# Patient Record
Sex: Female | Born: 1955 | Race: White | Hispanic: No | Smoking: Former smoker
Health system: Southern US, Community
[De-identification: ages and names within clinical notes are randomized; demographics above are authoritative.]

## PROBLEM LIST (undated history)

## (undated) DIAGNOSIS — R51 Headache: Secondary | ICD-10-CM

## (undated) DIAGNOSIS — N6019 Diffuse cystic mastopathy of unspecified breast: Secondary | ICD-10-CM

## (undated) DIAGNOSIS — L570 Actinic keratosis: Secondary | ICD-10-CM

## (undated) DIAGNOSIS — N898 Other specified noninflammatory disorders of vagina: Secondary | ICD-10-CM

## (undated) DIAGNOSIS — M8580 Other specified disorders of bone density and structure, unspecified site: Secondary | ICD-10-CM

## (undated) DIAGNOSIS — G4700 Insomnia, unspecified: Secondary | ICD-10-CM

## (undated) DIAGNOSIS — I059 Rheumatic mitral valve disease, unspecified: Secondary | ICD-10-CM

## (undated) DIAGNOSIS — F419 Anxiety disorder, unspecified: Secondary | ICD-10-CM

## (undated) DIAGNOSIS — L219 Seborrheic dermatitis, unspecified: Secondary | ICD-10-CM

## (undated) DIAGNOSIS — IMO0002 Reserved for concepts with insufficient information to code with codable children: Secondary | ICD-10-CM

## (undated) DIAGNOSIS — Z808 Family history of malignant neoplasm of other organs or systems: Secondary | ICD-10-CM

## (undated) DIAGNOSIS — E739 Lactose intolerance, unspecified: Secondary | ICD-10-CM

## (undated) HISTORY — DX: Insomnia, unspecified: G47.00

## (undated) HISTORY — DX: Other specified disorders of bone density and structure, unspecified site: M85.80

## (undated) HISTORY — DX: Other specified noninflammatory disorders of vagina: N89.8

## (undated) HISTORY — DX: Diffuse cystic mastopathy of unspecified breast: N60.19

## (undated) HISTORY — DX: Seborrheic dermatitis, unspecified: L21.9

## (undated) HISTORY — DX: Rheumatic mitral valve disease, unspecified: I05.9

## (undated) HISTORY — PX: TUBAL LIGATION: SHX77

## (undated) HISTORY — DX: Anxiety disorder, unspecified: F41.9

## (undated) HISTORY — DX: Actinic keratosis: L57.0

## (undated) HISTORY — DX: Headache: R51

## (undated) HISTORY — DX: Lactose intolerance, unspecified: E73.9

## (undated) HISTORY — PX: LUMBAR DISC SURGERY: SHX700

## (undated) HISTORY — DX: Family history of malignant neoplasm of other organs or systems: Z80.8

## (undated) HISTORY — DX: Reserved for concepts with insufficient information to code with codable children: IMO0002

---

## 2000-01-12 ENCOUNTER — Other Ambulatory Visit: Admission: RE | Admit: 2000-01-12 | Discharge: 2000-01-12 | Payer: Self-pay | Admitting: Family Medicine

## 2001-01-12 ENCOUNTER — Other Ambulatory Visit: Admission: RE | Admit: 2001-01-12 | Discharge: 2001-01-12 | Payer: Self-pay | Admitting: Family Medicine

## 2001-06-11 ENCOUNTER — Encounter: Payer: Self-pay | Admitting: Neurosurgery

## 2001-06-11 ENCOUNTER — Encounter: Admission: RE | Admit: 2001-06-11 | Discharge: 2001-06-11 | Payer: Self-pay | Admitting: Neurosurgery

## 2001-06-17 HISTORY — PX: OTHER SURGICAL HISTORY: SHX169

## 2001-06-21 ENCOUNTER — Inpatient Hospital Stay (HOSPITAL_COMMUNITY): Admission: RE | Admit: 2001-06-21 | Discharge: 2001-06-26 | Payer: Self-pay | Admitting: Neurosurgery

## 2002-02-17 HISTORY — PX: EXCISION MORTON'S NEUROMA: SHX5013

## 2002-04-17 ENCOUNTER — Other Ambulatory Visit: Admission: RE | Admit: 2002-04-17 | Discharge: 2002-04-17 | Payer: Self-pay | Admitting: Family Medicine

## 2003-01-18 HISTORY — PX: CERVICAL DISC SURGERY: SHX588

## 2003-02-06 ENCOUNTER — Inpatient Hospital Stay (HOSPITAL_COMMUNITY): Admission: RE | Admit: 2003-02-06 | Discharge: 2003-02-07 | Payer: Self-pay | Admitting: Neurosurgery

## 2003-07-22 ENCOUNTER — Other Ambulatory Visit: Admission: RE | Admit: 2003-07-22 | Discharge: 2003-07-22 | Payer: Self-pay | Admitting: Family Medicine

## 2004-07-23 ENCOUNTER — Other Ambulatory Visit: Admission: RE | Admit: 2004-07-23 | Discharge: 2004-07-23 | Payer: Self-pay | Admitting: Family Medicine

## 2004-07-23 ENCOUNTER — Ambulatory Visit: Payer: Self-pay | Admitting: Family Medicine

## 2004-07-28 ENCOUNTER — Ambulatory Visit: Payer: Self-pay | Admitting: Family Medicine

## 2004-08-10 ENCOUNTER — Ambulatory Visit: Payer: Self-pay | Admitting: Family Medicine

## 2004-10-17 HISTORY — PX: OTHER SURGICAL HISTORY: SHX169

## 2005-01-21 ENCOUNTER — Ambulatory Visit: Payer: Self-pay | Admitting: Internal Medicine

## 2005-03-28 ENCOUNTER — Ambulatory Visit: Payer: Self-pay | Admitting: Family Medicine

## 2005-04-12 ENCOUNTER — Ambulatory Visit: Payer: Self-pay | Admitting: Family Medicine

## 2005-05-04 ENCOUNTER — Ambulatory Visit: Payer: Self-pay | Admitting: Family Medicine

## 2005-05-25 ENCOUNTER — Ambulatory Visit: Payer: Self-pay | Admitting: Family Medicine

## 2005-06-01 ENCOUNTER — Ambulatory Visit: Payer: Self-pay | Admitting: Family Medicine

## 2005-09-08 ENCOUNTER — Other Ambulatory Visit: Admission: RE | Admit: 2005-09-08 | Discharge: 2005-09-08 | Payer: Self-pay | Admitting: Family Medicine

## 2005-09-09 ENCOUNTER — Encounter: Payer: Self-pay | Admitting: Family Medicine

## 2005-09-09 ENCOUNTER — Ambulatory Visit: Payer: Self-pay | Admitting: Family Medicine

## 2005-09-09 LAB — CONVERTED CEMR LAB: Pap Smear: NORMAL

## 2005-09-27 ENCOUNTER — Ambulatory Visit: Payer: Self-pay | Admitting: Family Medicine

## 2005-10-03 ENCOUNTER — Ambulatory Visit: Payer: Self-pay | Admitting: Family Medicine

## 2006-03-08 ENCOUNTER — Ambulatory Visit: Payer: Self-pay | Admitting: Family Medicine

## 2006-05-05 ENCOUNTER — Encounter: Payer: Self-pay | Admitting: Family Medicine

## 2006-05-05 DIAGNOSIS — J309 Allergic rhinitis, unspecified: Secondary | ICD-10-CM | POA: Insufficient documentation

## 2006-05-05 DIAGNOSIS — L570 Actinic keratosis: Secondary | ICD-10-CM | POA: Insufficient documentation

## 2006-05-05 DIAGNOSIS — F411 Generalized anxiety disorder: Secondary | ICD-10-CM | POA: Insufficient documentation

## 2006-05-05 DIAGNOSIS — I059 Rheumatic mitral valve disease, unspecified: Secondary | ICD-10-CM | POA: Insufficient documentation

## 2006-05-05 DIAGNOSIS — N6019 Diffuse cystic mastopathy of unspecified breast: Secondary | ICD-10-CM | POA: Insufficient documentation

## 2006-05-05 DIAGNOSIS — L219 Seborrheic dermatitis, unspecified: Secondary | ICD-10-CM | POA: Insufficient documentation

## 2006-05-05 DIAGNOSIS — M542 Cervicalgia: Secondary | ICD-10-CM | POA: Insufficient documentation

## 2006-09-15 ENCOUNTER — Ambulatory Visit: Payer: Self-pay | Admitting: Family Medicine

## 2006-09-16 ENCOUNTER — Encounter: Payer: Self-pay | Admitting: Family Medicine

## 2006-09-19 LAB — CONVERTED CEMR LAB
ALT: 26 units/L (ref 0–35)
AST: 34 units/L (ref 0–37)
Albumin: 4.6 g/dL (ref 3.5–5.2)
Alkaline Phosphatase: 57 units/L (ref 39–117)
BUN: 18 mg/dL (ref 6–23)
Basophils Absolute: 0 10*3/uL (ref 0.0–0.1)
Basophils Relative: 0 % (ref 0–1)
CO2: 24 meq/L (ref 19–32)
Calcium: 9.4 mg/dL (ref 8.4–10.5)
Chloride: 105 meq/L (ref 96–112)
Creatinine, Ser: 0.91 mg/dL (ref 0.40–1.20)
Eosinophils Absolute: 0 10*3/uL (ref 0.0–0.7)
Eosinophils Relative: 1 % (ref 0–5)
Glucose, Bld: 67 mg/dL — ABNORMAL LOW (ref 70–99)
HCT: 39.5 % (ref 36.0–46.0)
Hemoglobin: 12.8 g/dL (ref 12.0–15.0)
Lymphocytes Relative: 27 % (ref 12–46)
Lymphs Abs: 1.2 10*3/uL (ref 0.7–3.3)
MCHC: 32.4 g/dL (ref 30.0–36.0)
MCV: 92.7 fL (ref 78.0–100.0)
Monocytes Absolute: 0.3 10*3/uL (ref 0.2–0.7)
Monocytes Relative: 6 % (ref 3–11)
Neutro Abs: 2.9 10*3/uL (ref 1.7–7.7)
Neutrophils Relative %: 66 % (ref 43–77)
Platelets: 174 10*3/uL (ref 150–400)
Potassium: 4.2 meq/L (ref 3.5–5.3)
RBC: 4.26 M/uL (ref 3.87–5.11)
RDW: 13.7 % (ref 11.5–14.0)
Sodium: 141 meq/L (ref 135–145)
TSH: 2.489 microintl units/mL (ref 0.350–5.50)
Total Bilirubin: 0.6 mg/dL (ref 0.3–1.2)
Total Protein: 7.2 g/dL (ref 6.0–8.3)
WBC: 4.3 10*3/uL (ref 4.0–10.5)

## 2006-09-28 ENCOUNTER — Ambulatory Visit: Payer: Self-pay | Admitting: Family Medicine

## 2006-09-29 ENCOUNTER — Encounter (INDEPENDENT_AMBULATORY_CARE_PROVIDER_SITE_OTHER): Payer: Self-pay | Admitting: *Deleted

## 2006-09-29 LAB — FECAL OCCULT BLOOD, GUAIAC: Fecal Occult Blood: NEGATIVE

## 2006-11-01 ENCOUNTER — Encounter: Payer: Self-pay | Admitting: Family Medicine

## 2006-11-01 ENCOUNTER — Ambulatory Visit: Payer: Self-pay | Admitting: Family Medicine

## 2006-11-06 ENCOUNTER — Encounter (INDEPENDENT_AMBULATORY_CARE_PROVIDER_SITE_OTHER): Payer: Self-pay | Admitting: *Deleted

## 2007-09-18 ENCOUNTER — Other Ambulatory Visit: Admission: RE | Admit: 2007-09-18 | Discharge: 2007-09-18 | Payer: Self-pay | Admitting: Family Medicine

## 2007-09-18 ENCOUNTER — Ambulatory Visit: Payer: Self-pay | Admitting: Family Medicine

## 2007-09-18 ENCOUNTER — Encounter: Payer: Self-pay | Admitting: Family Medicine

## 2007-09-18 DIAGNOSIS — M899 Disorder of bone, unspecified: Secondary | ICD-10-CM

## 2007-09-18 DIAGNOSIS — M810 Age-related osteoporosis without current pathological fracture: Secondary | ICD-10-CM | POA: Insufficient documentation

## 2007-09-18 DIAGNOSIS — M8580 Other specified disorders of bone density and structure, unspecified site: Secondary | ICD-10-CM | POA: Insufficient documentation

## 2007-09-18 DIAGNOSIS — M949 Disorder of cartilage, unspecified: Secondary | ICD-10-CM

## 2007-09-21 ENCOUNTER — Telehealth (INDEPENDENT_AMBULATORY_CARE_PROVIDER_SITE_OTHER): Payer: Self-pay | Admitting: *Deleted

## 2007-09-22 LAB — CONVERTED CEMR LAB
ALT: 28 units/L (ref 0–35)
AST: 39 units/L — ABNORMAL HIGH (ref 0–37)
Albumin: 4.2 g/dL (ref 3.5–5.2)
Alkaline Phosphatase: 52 units/L (ref 39–117)
BUN: 14 mg/dL (ref 6–23)
Basophils Absolute: 0 10*3/uL (ref 0.0–0.1)
Basophils Relative: 0.3 % (ref 0.0–3.0)
Bilirubin, Direct: 0.1 mg/dL (ref 0.0–0.3)
CO2: 32 meq/L (ref 19–32)
Calcium: 9.3 mg/dL (ref 8.4–10.5)
Chloride: 111 meq/L (ref 96–112)
Cholesterol: 181 mg/dL (ref 0–200)
Creatinine, Ser: 0.9 mg/dL (ref 0.4–1.2)
Eosinophils Absolute: 0.1 10*3/uL (ref 0.0–0.7)
Eosinophils Relative: 1.9 % (ref 0.0–5.0)
GFR calc Af Amer: 84 mL/min
GFR calc non Af Amer: 70 mL/min
Glucose, Bld: 74 mg/dL (ref 70–99)
HCT: 37.2 % (ref 36.0–46.0)
HDL: 37.8 mg/dL — ABNORMAL LOW (ref >39.0)
Hemoglobin: 12.9 g/dL (ref 12.0–15.0)
LDL Cholesterol: 124 mg/dL — ABNORMAL HIGH (ref 0–99)
Lymphocytes Relative: 24.1 % (ref 12.0–46.0)
MCHC: 34.6 g/dL (ref 30.0–36.0)
MCV: 91.7 fL (ref 78.0–100.0)
Monocytes Absolute: 0.2 10*3/uL (ref 0.1–1.0)
Monocytes Relative: 5.5 % (ref 3.0–12.0)
Neutro Abs: 3 10*3/uL (ref 1.4–7.7)
Neutrophils Relative %: 68.2 % (ref 43.0–77.0)
Platelets: 172 10*3/uL (ref 150–400)
Potassium: 4.6 meq/L (ref 3.5–5.1)
RBC: 4.06 M/uL (ref 3.87–5.11)
RDW: 12.4 % (ref 11.5–14.6)
Sodium: 142 meq/L (ref 135–145)
TSH: 3.24 microintl units/mL (ref 0.35–5.50)
Total Bilirubin: 0.9 mg/dL (ref 0.3–1.2)
Total CHOL/HDL Ratio: 4.8
Total Protein: 7 g/dL (ref 6.0–8.3)
Triglycerides: 94 mg/dL (ref 0–149)
VLDL: 19 mg/dL (ref 0–40)
WBC: 4.3 10*3/uL — ABNORMAL LOW (ref 4.5–10.5)

## 2007-10-04 ENCOUNTER — Ambulatory Visit: Payer: Self-pay | Admitting: Family Medicine

## 2007-10-04 LAB — CONVERTED CEMR LAB
OCCULT 1: NEGATIVE
OCCULT 2: NEGATIVE
OCCULT 3: NEGATIVE

## 2007-10-18 ENCOUNTER — Encounter: Payer: Self-pay | Admitting: Family Medicine

## 2007-11-02 ENCOUNTER — Encounter (INDEPENDENT_AMBULATORY_CARE_PROVIDER_SITE_OTHER): Payer: Self-pay | Admitting: *Deleted

## 2007-11-06 ENCOUNTER — Ambulatory Visit: Payer: Self-pay | Admitting: Family Medicine

## 2007-11-06 ENCOUNTER — Encounter: Payer: Self-pay | Admitting: Family Medicine

## 2007-12-11 ENCOUNTER — Encounter: Payer: Self-pay | Admitting: Family Medicine

## 2008-01-03 ENCOUNTER — Ambulatory Visit: Payer: Self-pay | Admitting: Family Medicine

## 2008-03-31 ENCOUNTER — Ambulatory Visit: Payer: Self-pay | Admitting: Family Medicine

## 2008-03-31 ENCOUNTER — Telehealth: Payer: Self-pay | Admitting: Family Medicine

## 2008-03-31 DIAGNOSIS — R519 Headache, unspecified: Secondary | ICD-10-CM | POA: Insufficient documentation

## 2008-03-31 DIAGNOSIS — R51 Headache: Secondary | ICD-10-CM | POA: Insufficient documentation

## 2008-03-31 DIAGNOSIS — L259 Unspecified contact dermatitis, unspecified cause: Secondary | ICD-10-CM | POA: Insufficient documentation

## 2008-04-04 LAB — CONVERTED CEMR LAB
Basophils Absolute: 0 10*3/uL (ref 0.0–0.1)
Basophils Relative: 0.5 % (ref 0.0–3.0)
Eosinophils Absolute: 0 10*3/uL (ref 0.0–0.7)
Eosinophils Relative: 0.7 % (ref 0.0–5.0)
HCT: 37.6 % (ref 36.0–46.0)
Hemoglobin: 13.2 g/dL (ref 12.0–15.0)
Lymphocytes Relative: 23.3 % (ref 12.0–46.0)
MCHC: 35.1 g/dL (ref 30.0–36.0)
MCV: 90.1 fL (ref 78.0–100.0)
Monocytes Absolute: 0.3 10*3/uL (ref 0.1–1.0)
Monocytes Relative: 6.4 % (ref 3.0–12.0)
Neutro Abs: 3.4 10*3/uL (ref 1.4–7.7)
Neutrophils Relative %: 69.1 % (ref 43.0–77.0)
Platelets: 148 10*3/uL — ABNORMAL LOW (ref 150–400)
RBC: 4.17 M/uL (ref 3.87–5.11)
RDW: 12.3 % (ref 11.5–14.6)
Sed Rate: 11 mm/hr (ref 0–22)
WBC: 4.8 10*3/uL (ref 4.5–10.5)

## 2008-08-19 ENCOUNTER — Ambulatory Visit: Payer: Self-pay | Admitting: Family Medicine

## 2008-09-23 ENCOUNTER — Encounter: Payer: Self-pay | Admitting: Family Medicine

## 2008-10-08 ENCOUNTER — Ambulatory Visit: Payer: Self-pay | Admitting: Family Medicine

## 2008-11-10 ENCOUNTER — Encounter: Payer: Self-pay | Admitting: Family Medicine

## 2008-11-10 ENCOUNTER — Ambulatory Visit: Payer: Self-pay | Admitting: Family Medicine

## 2008-11-20 ENCOUNTER — Encounter (INDEPENDENT_AMBULATORY_CARE_PROVIDER_SITE_OTHER): Payer: Self-pay | Admitting: *Deleted

## 2008-11-21 ENCOUNTER — Ambulatory Visit: Payer: Self-pay | Admitting: Family Medicine

## 2008-11-24 LAB — CONVERTED CEMR LAB
Alkaline Phosphatase: 56 units/L (ref 39–117)
BUN: 12 mg/dL (ref 6–23)
Basophils Absolute: 0 10*3/uL (ref 0.0–0.1)
Bilirubin, Direct: 0.2 mg/dL (ref 0.0–0.3)
Chloride: 107 meq/L (ref 96–112)
Cholesterol: 162 mg/dL (ref 0–200)
Eosinophils Absolute: 0 10*3/uL (ref 0.0–0.7)
GFR calc non Af Amer: 69.17 mL/min (ref >60)
LDL Cholesterol: 100 mg/dL — ABNORMAL HIGH (ref 0–99)
Lymphocytes Relative: 33.1 % (ref 12.0–46.0)
MCHC: 35.6 g/dL (ref 30.0–36.0)
Neutrophils Relative %: 57.2 % (ref 43.0–77.0)
Platelets: 167 10*3/uL (ref 150.0–400.0)
Potassium: 4.9 meq/L (ref 3.5–5.1)
RBC: 4.13 M/uL (ref 3.87–5.11)
RDW: 12 % (ref 11.5–14.6)
Sodium: 144 meq/L (ref 135–145)
Total Bilirubin: 1 mg/dL (ref 0.3–1.2)
VLDL: 11.6 mg/dL (ref 0.0–40.0)
WBC: 3.5 10*3/uL — ABNORMAL LOW (ref 4.5–10.5)

## 2009-01-28 ENCOUNTER — Ambulatory Visit: Payer: Self-pay | Admitting: Family Medicine

## 2009-05-29 ENCOUNTER — Ambulatory Visit: Payer: Self-pay | Admitting: Family Medicine

## 2009-06-08 ENCOUNTER — Telehealth: Payer: Self-pay | Admitting: Family Medicine

## 2009-06-10 ENCOUNTER — Telehealth: Payer: Self-pay | Admitting: Family Medicine

## 2009-06-10 ENCOUNTER — Encounter: Payer: Self-pay | Admitting: Family Medicine

## 2009-06-19 ENCOUNTER — Ambulatory Visit: Payer: Self-pay | Admitting: Family Medicine

## 2009-08-19 ENCOUNTER — Ambulatory Visit: Payer: Self-pay | Admitting: Family Medicine

## 2010-01-13 ENCOUNTER — Telehealth (INDEPENDENT_AMBULATORY_CARE_PROVIDER_SITE_OTHER): Payer: Self-pay | Admitting: *Deleted

## 2010-01-13 ENCOUNTER — Encounter: Payer: Self-pay | Admitting: Family Medicine

## 2010-01-13 ENCOUNTER — Ambulatory Visit: Payer: Self-pay | Admitting: Family Medicine

## 2010-01-18 LAB — CONVERTED CEMR LAB
ALT: 24 units/L (ref 0–35)
BUN: 14 mg/dL (ref 6–23)
Basophils Absolute: 0 10*3/uL (ref 0.0–0.1)
Bilirubin, Direct: 0.1 mg/dL (ref 0.0–0.3)
Cholesterol: 178 mg/dL (ref 0–200)
Creatinine, Ser: 0.9 mg/dL (ref 0.4–1.2)
Eosinophils Relative: 1.2 % (ref 0.0–5.0)
GFR calc non Af Amer: 71.63 mL/min (ref >60.00)
Glucose, Bld: 83 mg/dL (ref 70–99)
LDL Cholesterol: 113 mg/dL — ABNORMAL HIGH (ref 0–99)
MCV: 90.3 fL (ref 78.0–100.0)
Monocytes Absolute: 0.3 10*3/uL (ref 0.1–1.0)
Neutrophils Relative %: 62.7 % (ref 43.0–77.0)
Platelets: 173 10*3/uL (ref 150.0–400.0)
RDW: 13.1 % (ref 11.5–14.6)
Total Bilirubin: 0.6 mg/dL (ref 0.3–1.2)
Triglycerides: 50 mg/dL (ref 0.0–149.0)
VLDL: 10 mg/dL (ref 0.0–40.0)
Vit D, 25-Hydroxy: 42 ng/mL (ref 30–89)
WBC: 4.3 10*3/uL — ABNORMAL LOW (ref 4.5–10.5)

## 2010-01-27 ENCOUNTER — Ambulatory Visit
Admission: RE | Admit: 2010-01-27 | Discharge: 2010-01-27 | Payer: Self-pay | Source: Home / Self Care | Attending: Family Medicine | Admitting: Family Medicine

## 2010-01-27 DIAGNOSIS — G4700 Insomnia, unspecified: Secondary | ICD-10-CM | POA: Insufficient documentation

## 2010-02-17 ENCOUNTER — Encounter: Payer: Self-pay | Admitting: Family Medicine

## 2010-02-17 ENCOUNTER — Ambulatory Visit: Payer: Self-pay | Admitting: Family Medicine

## 2010-02-17 LAB — HM MAMMOGRAPHY: HM Mammogram: NORMAL

## 2010-02-18 NOTE — Miscellaneous (Signed)
Summary: Controlled Substance Agreement  Controlled Substance Agreement   Imported By: Lanelle Bal 06/25/2009 09:07:34  _____________________________________________________________________  External Attachment:    Type:   Image     Comment:   External Document

## 2010-02-18 NOTE — Progress Notes (Signed)
----   Converted from flag ---- ---- 01/13/2010 8:06 AM, Colon Flattery Tower MD wrote: please check wellness and lipid and vitD v70.0 and 733.0 thanks  ---- 01/12/2010 11:13 AM, Liane Comber CMA (AAMA) wrote: Lab orders please! Good Morning! This pt is scheduled for cpx labs Wed, which labs to draw and dx codes to use? Thanks Tasha ------------------------------

## 2010-02-18 NOTE — Assessment & Plan Note (Signed)
Summary: iNSURANCE FORM PER DR TOWR/RI   Vital Signs:  Patient profile:   55 year old female Height:      68.5 inches Weight:      149.75 pounds BMI:     22.52 Temp:     98.3 degrees F oral Pulse rate:   80 / minute Pulse rhythm:   regular BP sitting:   136 / 84  (left arm) Cuff size:   regular  Vitals Entered By: Lewanda Rife LPN (June 19, 452 11:47 AM) CC: Insurance form   History of Present Illness: is doing quite a bit better - husband is doing better and getting through chemo  she is helping him out  will plan some surgery - positive outlook   she is feeling better  feels like she can focus now and head is clear  is set up at home  wants to go back to work monday    took paxil - due to constipation after 5 days  ? if could tell -- if was helping  now managing a lot better   has 2 insurance disability forms to fill out that are detailed (scanned - see those questions)- about psychiatric functionality and ability to work    Allergies: 1)  ! * Flonase 2)  ! * Calcium 3)  ! 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  walks daily for exercise  no alcohol  married- husb dx with stomach cancer 5/11  Risk Factors: Smoking Status:  quit (05/05/2006)  Review of Systems General:  Complains of fatigue; denies loss of appetite and malaise. Eyes:  Denies blurring, double vision, and eye irritation. CV:  Denies chest pain or discomfort, lightheadness, and palpitations. Resp:  Denies cough and wheezing. GI:  Denies abdominal pain, nausea, and vomiting; appetite is back. MS:  Denies joint pain. Derm:  Denies rash. Psych:  Complains of anxiety; denies panic attacks; panic attacks are gone. Endo:  Denies cold intolerance and heat intolerance.  Physical Exam  General:  Well-developed,well-nourished,in no acute distress; alert,appropriate and cooperative throughout examination Head:  normocephalic, atraumatic, and no abnormalities observed.   Eyes:  vision grossly intact, pupils equal, pupils round, and pupils reactive to light.   Neck:  supple with full rom and no masses or thyromegally, no JVD or carotid bruit  Lungs:  Normal respiratory effort, chest expands symmetrically. Lungs are clear to auscultation, no crackles or wheezes. Heart:  Normal rate and regular rhythm. S1 and S2 normal without gallop, murmur, click, rub or other extra sounds. Msk:  No deformity or scoliosis noted of thoracic or lumbar spine.   Extremities:  No clubbing, cyanosis, edema, or deformity noted with normal full range of motion of all joints.   Neurologic:  cranial nerves II-XII intact, sensation intact to light touch, gait normal, and DTRs symmetrical and normal.  no tremor  Skin:  Intact without  suspicious lesions or rashes Cervical Nodes:  No lymphadenopathy noted Psych:  much improved affect  not tearful  good eye contact and communication skills   Additional Exam:  answered all form questions in detail - this took over 20 min face to face time    Impression & Recommendations:  Problem # 1:  ANXIETY (ICD-300.00) Assessment Improved  with stress reaction--much improved after just 5 days of medication extensive forms filled out today --  see forms / scanned feels able to return to work monday good coping skills and support  alprazolam as needed  The following medications were removed from the medication list:    Paxil 10 Mg Tabs (Paroxetine hcl) .Marland Kitchen... 1 by mouth once daily in evening for 14 days and then increase to 2 by mouth each evening Her updated medication list for this problem includes:    Alprazolam 0.5 Mg Tabs (Alprazolam) .Marland Kitchen... 1 by mouth once daily as needed anxiety  Orders: Form Completion (16[address])  Complete Medication List: 1)  Vitamin D  .... Daily 2)  Ibp  .... As needed 3)  Flexeril 10 Mg Tabs (Cyclobenzaprine hcl) .... 1/2  by mouth at bedtime as needed 4)  Alprazolam 0.5 Mg Tabs (Alprazolam) .Marland Kitchen.. 1 by mouth once daily as needed anxiety 5)  Nasonex 50 Mcg/act Susp (Mometasone furoate) .... Use 2 sprays each nostril once daily as needed  Patient Instructions: 1)  back to work [employer] as planned  2)  let me know if you feel worse again - or any questions 3)  I will get papers faxed in today   Current Allergies (reviewed today): ! Aleda Grana ! * CALCIUM ! PAXIL

## 2010-02-18 NOTE — Assessment & Plan Note (Signed)
Summary: anxiety/alc   Vital Signs:  Patient profile:   55 year old female Height:      68.5 inches Weight:      150.75 pounds BMI:     22.67 Temp:     98.5 degrees F oral Pulse rate:   76 / minute Pulse rhythm:   regular BP sitting:   128 / 80  (left arm) Cuff size:   regular  Vitals Entered By: Lewanda Rife LPN (May 29, 2009 4:11 PM) CC: anxiety   History of Present Illness: is having a lot of stress and anxiety  just found out husband has stomach cancer -- just had some scans today   is jumpy and uneasy inside  not a lot of panic attacks - she can stop them  is sleeping fairly -- oc uses alprazolam at night  appetite is so/so -- sometimes has to eat when she is not hungry  cries a lot  is more anxious than down feeling for the most part    is not able to work right now  cannot concentrate and cries a lot    has had anx for long time  started in her late 30s when she cared for her sick mother  started having panic attacks -- (after a cardiol work up )  learned to help herself with deep breaths and rational throught -- for a while that worked  also jumpy inside-- took valium for for about a month   has never had SSRI  has never had counseling before  sisters are support-- good listeners and help out a lot  does exercise- walking - and this helps too  working in the garden helps too has dog and cat too      Allergies: 1)  ! * Flonase 2)  ! 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  walks daily for exercise  no alcohol  married- husb dx with stomach cancer 5/11  Risk Factors: Smoking Status: quit (05/05/2006)  Social History: quit smoking 2003 Patient is a former smoker.  walks daily for exercise  no alcohol  married- husb dx with stomach cancer 5/11  Review of Systems General:  Complains of fatigue, loss of appetite, and sleep disorder; denies malaise. Eyes:  Denies blurring and eye irritation. CV:  Denies chest pain or discomfort and palpitations. Resp:  Denies cough and shortness of breath. GI:  Denies excessive appetite, nausea, and vomiting. MS:  Denies joint pain. Derm:  Denies lesion(s), poor wound healing, and rash. Neuro:  Complains of tremors; denies headaches and memory loss. Psych:  Complains of anxiety, depression, easily tearful, and panic attacks; denies suicidal thoughts/plans. Endo:  Denies cold intolerance, excessive thirst, excessive urination, and heat intolerance. Heme:  Denies abnormal bruising and bleeding.  Physical Exam  General:  fatigued but well appearing  Head:  normocephalic, atraumatic, and no abnormalities observed.  Eyes:  vision grossly intact, pupils equal, pupils round, and pupils reactive to light.   Mouth:  pharynx pink and moist.   Neck:  supple with full rom and no masses or thyromegally, no JVD or carotid bruit  Chest Wall:  No deformities, masses, or tenderness noted. Lungs:  Normal respiratory effort, chest expands symmetrically. Lungs are clear to auscultation, no crackles or wheezes. Heart:  Normal rate and regular rhythm. S1 and S2 normal without gallop, murmur, click, rub or other extra sounds. Abdomen:  soft, non-tender, and normal bowel  sounds.   Msk:  No deformity or scoliosis noted of thoracic or lumbar spine.   Neurologic:  sensation intact to light touch, gait normal, and DTRs symmetrical and normal.  no tremor today Skin:  Intact without suspicious lesions or rashes Cervical Nodes:  No lymphadenopathy noted Psych:  anxoius and depressed appearing tearful at times  good insight and comm skills  eye contact fair no SI   Impression & Recommendations:  Problem # 1:  ANXIETY (ICD-300.00) Assessment Deteriorated much worse with news of husband's cancer  disc hx of anx-never in past has it incapacitated her and now it is (not unexpectedly)  disc symptoms / coping skills/ insight/ support sources/ tx options and side eff in great detail today she has good insight and does not want counseling yet since she has such excellent family support plan made to start her on paxil (side eff disc) and use alprazolam for breakthrough anx (wilth effort to hot create habit) enc [place] of gardening / exercise  f/u 3 -4 wk  FMLA done - will likely be out of work for 1-2 mo  30 min face to face time spent with pt - over 50% of that spent in  counseling and coordination of care  Her updated medication list for this problem includes:    Alprazolam 0.5 Mg Tabs (Alprazolam) .Marland Kitchen... 1 by mouth once daily as needed anxiety    Paxil 10 Mg Tabs (Paroxetine hcl) .Marland Kitchen... 1 by mouth once daily in evening for 14 days and then increase to 2 by mouth each evening  Complete Medication List: 1)  Vitamin D  .... Daily 2)  Ibp  .... As needed 3)  Flexeril 10 Mg Tabs (Cyclobenzaprine hcl) .... 1/2  by mouth at bedtime as needed 4)  Alprazolam 0.5 Mg Tabs (Alprazolam) .Marland Kitchen.. 1 by mouth once daily as needed anxiety 5)  Nasonex 50 Mcg/act Susp (Mometasone furoate) .... Use 2 sprays each nostril once daily as needed 6)  Paxil 10 Mg Tabs (Paroxetine hcl) .Marland Kitchen.. 1 by mouth once daily in evening for 14 days and then increase to 2 by mouth each  evening  Patient Instructions: 1)  start paxil 10 mg one pill each evening for 2 weeks and then increase to 2 pills each evening  2)  a little nausea is common in first few days- but if it persists- let me know 3)  if other side effects or if you feel worse or suicidal stop med and call  4)  keep up the exercise  5)  keep up the contact with your support  6)  use alprazolam only as needed  7)  if you are interested in counseling in the future- please update me  8)  follow up in 3 weeks with me  9)  plan to return to work in 4 weeks if feeling better  Prescriptions: ALPRAZOLAM 0.5 MG TABS (ALPRAZOLAM) 1 by mouth once daily as needed anxiety  #  30 x 1   Entered and Authorized by:   Judith Part MD   Signed by:   Judith Part MD on 05/29/2009   Method used:   Print then Give to Patient   RxID:   [id number] PAXIL 10 MG TABS (PAROXETINE HCL) 1 by mouth once daily in evening for 14 days and then increase to 2 by mouth each evening  #60 x 3   Entered and Authorized by:   Judith Part MD   Signed by:   Judith Part MD on 05/29/2009   Method used:   Electronically to        [facility] [address] (retail)       [address], [address]       [place], [place]  [address]       Ph: [phone]       Fax: [phone]   RxID:   [id number]   Current Allergies (reviewed today): ! Aleda Grana ! * CALCIUM

## 2010-02-18 NOTE — Progress Notes (Signed)
Summary: ? Disability Forms Received  Phone Note From Other Clinic   Caller: [employer] Disability Forms:  [id number]      Claim Ref:  [id number] Summary of Call: [employer] calling to see if the Disability Forms have been received.  They were faxed on May 17th.  Please let me know if you do not have them, I will call them back. Initial call taken by: Delilah Shan CMA Duncan Dull),  Jun 08, 2009 11:28 AM  Follow-up for Phone Call        I required pt to f/u with me to fill out these forms since they are so detailed I will hold them on my desk until she f/u on june 3 at 11:30 so we can do them together  Follow-up by: Judith Part MD,  Jun 08, 2009 12:46 PM

## 2010-02-18 NOTE — Assessment & Plan Note (Signed)
Summary: NEED FORM FILL OUT  CYD   Vital Signs:  Patient profile:   55 year old female Weight:      152 pounds Temp:     98.2 degrees F oral Pulse rate:   84 / minute Pulse rhythm:   regular BP sitting:   132 / 80  (left arm) Cuff size:   regular  Vitals Entered By: Lowella Petties CMA (January 28, 2009 2:01 PM) CC: Pt needs FMLA form completed- she was out of work sick last week.   History of Present Illness: works at [employer]-- was out for a week -- for illness needs fmla for that time  was sick with the flu or virus  (vomited for 14 hours)  started with vomiting and diarrhea  really zapped her - very weak and dehydrated  missed from jan 1- jan 7th   is drinking good fluids and pepcid is helping stomach - which is still sore and growly  is getting back to solid food - apple sauce and toast and chicken and jello  lot of gatorade  is feeling better now  did have a fever -- chilles and body aches  husband got it too   does not think it was food related   also lost her brother to lung ca this week (was out for services) doing ok with grief overall- a tough time for her family     Allergies: 1)  ! * Flonase 2)  ! 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  walks daily for exercise   Risk Factors: Smoking Status: quit (05/05/2006)  Family History: sister with melanoma father with lung ca mother CAD, CHF, HTN son with anx brother with lung ca-smoker-- passed 1/11  Review of Systems General:  Complains of fatigue and loss of appetite; denies chills, fever, and malaise. Eyes:  Denies blurring and discharge. ENT:  Denies earache, nasal congestion, and sore throat. CV:  Denies chest pain or discomfort and palpitations. Resp:  Denies cough and shortness of breath. GI:  Complains of change in bowel habits, indigestion, loss of appetite, and nausea; denies dark tarry stools. Derm:  Denies poor wound healing and rash. Neuro:  Denies headaches. Endo:  Denies polyuria.  Physical Exam  General:  fatigued but well appearing  Head:  normocephalic, atraumatic, and no abnormalities observed.   Eyes:  vision grossly intact, pupils equal, pupils round, and pupils reactive to light.  no conjunctival pallor, injection or icterus  Mouth:  pharynx pink and moist.   Neck:  supple with full rom and no masses or thyromegally, no JVD or carotid bruit  Lungs:  Normal respiratory effort, chest expands symmetrically. Lungs are clear to auscultation, no crackles or wheezes. Heart:  Normal rate and regular rhythm. S1 and S2 normal without gallop, murmur, click, rub or other extra sounds. Abdomen:  slt tenderness bilat LQ without  rebound or gaurding soft, normal bowel sounds, no distention, no masses, no hepatomegaly, and no splenomegaly.   Skin:  Intact without suspicious lesions or rashes nl color and turgor with brisk cap refil Cervical Nodes:  No lymphadenopathy noted Inguinal Nodes:  No significant adenopathy Psych:  normal affect, talkative and pleasant    Impression & Recommendations:  Problem # 1:  GASTROENTERITIS, VIRAL (ICD-008.8) Assessment New with absence from work for 1 week - now much improved  disc imp  of very slow return to reg diet as tol (brat diet until then) continue good fluids- no signs of dehydration today ) update asap if symptoms worsen or return  continue pepcid for dyspepsia as needed  FMLA done for work today  Complete Medication List: 1)  Vitamin D  .... Daily 2)  Ibp  .... As needed 3)  Flexeril 10 Mg Tabs (Cyclobenzaprine hcl) .... 1/2  by mouth at bedtime 4)  Alprazolam 0.5 Mg Tabs (Alprazolam) .Marland Kitchen.. 1 by mouth once daily as needed anxiety 5)  Nasonex 50 Mcg/act Susp (Mometasone furoate) .... Use 2 sprays each nostril once daily  Patient Instructions: 1)  continue good fluid intake 2)  advance diet slowly as tolerated 3)  update me if symptoms worsen again  4)  forms done   Prior Medications (reviewed today): VITAMIN D () Daily IBP () as needed FLEXERIL 10 MG TABS (CYCLOBENZAPRINE HCL) 1/2  by mouth at bedtime ALPRAZOLAM 0.5 MG TABS (ALPRAZOLAM) 1 by mouth once daily as needed anxiety NASONEX 50 MCG/ACT SUSP (MOMETASONE FUROATE) Use 2 sprays each nostril once daily Current Allergies: ! Aleda Grana ! * CALCIUM

## 2010-02-18 NOTE — Letter (Signed)
Summary: Out of Work  [facility] [employer] [facility] at [facility]  [address] [place], [place] [address]   Phone: [phone]  Fax: [phone]    June 19, 2009   Employee:  HETVI SHAWHAN    To Whom It May Concern:   For Medical reasons, please excuse the above named employee from work for the following dates:  Start:   05/20/09  End:   06/22/2009 if she is feeling better   If you need additional information, please feel free to contact our office.         Sincerely,    Judith Part MD

## 2010-02-18 NOTE — Assessment & Plan Note (Signed)
Summary: RASH ON BACK/JBB   Vital Signs:  Patient Profile:   55 Years Old Female CC:      Rash on the back. / rwt Height:     66.5 inches (170.18 cm) Weight:      151 pounds BMI:     24.09 O2 Sat:      100 % O2 treatment:    Room Air Temp:     97.6 degrees F oral Pulse rate:   93 / minute Pulse rhythm:   regular Resp:     18 per minute BP sitting:   154 / 91  (right arm)  Pt. in pain?   no  Vitals Entered By: Levonne Spiller EMT-P (August 19, 2009 11:28 AM)              Is Patient Diabetic? No      Current Allergies: ! Aleda Grana ! * CALCIUM ! PAXILHistory of Present Illness History from: patient Reason for visit: see chief complaint Chief Complaint: Rash on the back. / rwt History of Present Illness: She noticed pruitic rash on the right upper back yesterday. She does work out in the garden, but doesn't remember a specific bite and hasn't pulled a tick off. She has been under stress, so wanted to make sure it wasn't shingles. Denies pain or deep itch. She has been taking Benadryl with little relief.   REVIEW OF SYSTEMS        Psych       Complains of anxiety/stress. Otherwise answered ROS as negative.  Past History:  Family History: Last updated: 01/28/2009 sister with melanoma father with lung ca mother CAD, CHF, HTN son with anx brother with lung ca-smoker-- passed 1/11  Social History: Last updated: 05/29/2009 quit smoking 2003 Patient is a former smoker.  walks daily for exercise  no alcohol  married- husb dx with stomach cancer 5/11 Physical Exam General appearance: well developed, well nourished, no acute distress Skin: pruitic erythematous mp circular patch right  thoracic area - two seborrheic keratotic lesions present within erythematous area MSE: oriented to time, place, and person Assessment New Problems: CONTACT DERMATITIS-NOS (ICD-692.9)   Plan New Medications/Changes: FLUOCINONIDE 0.05 % CREA (FLUOCINONIDE) apply to affected area  Q4hours x 1 day then three times a day as needed for itching  #15g x 0, 08/19/2009, Tacey Ruiz MD  New Orders: Est. Patient Level II [id number] Planning Comments:   Told her to continue taking Benadryl as needed by mouth. If the rash is not resolving or spreads then return or present to her PCP.   The patient and/or caregiver has been counseled thoroughly with regard to medications prescribed including dosage, schedule, interactions, rationale for use, and possible side effects and they verbalize understanding.  Diagnoses and expected course of recovery discussed and will return if not improved as expected or if the condition worsens. Patient and/or caregiver verbalized understanding.  Prescriptions: FLUOCINONIDE 0.05 % CREA (FLUOCINONIDE) apply to affected area Q4hours x 1 day then three times a day as needed for itching  #15g x 0   Entered and Authorized by:   Tacey Ruiz MD   Signed by:   Tacey Ruiz MD on 08/19/2009   Method used:   Electronically to        [facility] (retail)       [address], [address]       [place], [place]  [address]  Ph: [phone]       Fax: [phone]   RxID:   [id number]   Orders Added: 1)  Est. Patient Level II [84132]  The patient was informed that there is no on-call provider or services available at this clinic during off-hours (when the clinic is closed).  If the patient developed a problem or concern that required immediate attention, the patient was advised to go the the nearest available urgent care or emergency department for medical care.  The patient verbalized understanding.    The risks, benefits and possible side effects of the treatments and tests were explained clearly to the patient and the patient verbalized understanding.

## 2010-02-18 NOTE — Progress Notes (Signed)
Summary: needs letter for disability  Phone Note Other Incoming   Caller: [employer] Summary of Call: Pt dropped off disability forms for you to complete and was told she would need an office visit so that the 2 of you could go over them together.  She has an appt for 6/3 but Monia Pouch is saying that is too far away and her claim will be denied.  They are asking that you write a letter stating the date you took her out of work and the diagnosis, and that you will be sending the forms later.  This needs to be done asap and faxed to [employer] at [phone], so that the pt wont be denied. Initial call taken by: Lowella Petties CMA,  Jun 10, 2009 11:16 AM  Follow-up for Phone Call        note done and ready for fax  Follow-up by: Judith Part MD,  Jun 10, 2009 1:13 PM  Additional Follow-up for Phone Call Additional follow up Details #1::        letter faxed to [phone] as instructed. Letter at Rena's desk if needed.Lewanda Rife LPN  Jun 10, 2009 4:31 PM

## 2010-02-18 NOTE — Letter (Signed)
Summary: Generic Letter  [place] at [facility]  [address] [place], [place] [address]   Phone: [phone]  Fax: [phone]    06/10/2009  Denise Grant [address] [place], [place]  [address]  To whom it may concern,   My patient Denise Grant is currently out of work for severe anxiety associated with recent diagnosis of husband with cancer. She has been out of work since 05/20/09 for this condition.  She has appt scheduled with me on 06/19/2009 to review and fill out more detailed disability paperwork.     Please contact me if any questions.  Papers will be sent as soon as they are done.    Sincerely,   Roxy Manns MD

## 2010-02-18 NOTE — Assessment & Plan Note (Signed)
Summary: CPX/CLE   Vital Signs:  Patient profile:   55 year old female Height:      65.5 inches Weight:      149 pounds BMI:     24.51 Temp:     98.2 degrees F oral Pulse rate:   80 / minute Pulse rhythm:   regular BP sitting:   136 / 74  (left arm) Cuff size:   regular  Vitals Entered By: Lewanda Rife LPN (January 27, 2010 2:24 PM) CC: CPX LMP 2004   History of Present Illness: here for health mt exam and to disc chronic health problems  is doing ok overall  taking care of her husband with stomach cancer -- J tube and some eating  surgery went fairly well / chemo and rad   is having nasal congestion and some sinus pain on R and green d/c with blood no fever  thick post nasal drip  started last week  no cough   needs something to sleep at night  alprazolam was helpful when she had it  the otc pm meds do not work    wt is down 2 lb with bmi of 24  bp is 136/74 today  osteopenia 10/09- declined tx she declines dexa at this time  intol ca takes vit D and level is 42-- takes D sometimes  still a non smoker   pap nl 9/09 no symptoms or problems no abn paps  no new partners  has stable vaginal cyst-- no change   mam 10/10-- is due , wants [place] to set that up  self exam   td 04 does not get flu shots   lipids are fairly stable Last Lipid ProfileCholesterol: 178 (01/13/2010 8:56:45 AM)HDL:  55.20 (01/13/2010 8:56:45 AM)LDL:  113 (01/13/2010 8:56:45 AM)Triglycerides:  Last Liver profileSGOT:  32 (01/13/2010 8:56:45 AM)SPGT:  24 (01/13/2010 8:56:45 AM)T. Bili:  0.6 (01/13/2010 8:56:45 AM)Alk Phos:  53 (01/13/2010 8:56:45 AM)   is not exercising at all  wishes she could fit in  ? stop by track on way home        Allergies: 1)  ! * Flonase 2)  ! * Calcium 3)  ! 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  walks daily for exercise  no alcohol  married- husb dx with stomach cancer 5/11  Risk Factors: Smoking Status: quit (05/05/2006)  Review of Systems General:  Complains of sleep disorder; denies chills, fever, loss of appetite, and malaise. Eyes:  Denies blurring and eye irritation. ENT:  Complains of nasal congestion, postnasal drainage, sinus pressure, and sore throat. CV:  Denies chest pain or discomfort, lightheadness, and palpitations. Resp:  Denies cough, shortness of breath, and wheezing. GI:  Denies abdominal pain, change in bowel habits, and indigestion. GU:  Denies dysuria and urinary frequency. MS:  Denies joint pain, joint redness, and joint swelling. Derm:  Denies  itching, lesion(s), poor wound healing, and rash. Neuro:  Denies numbness and tingling. Psych:  mood is ok . Endo:  Denies cold intolerance, excessive thirst, excessive urination, and heat intolerance. Heme:  Denies abnormal bruising and bleeding.  Physical Exam  General:  Well-developed,well-nourished,in no acute distress; alert,appropriate and cooperative throughout examination Head:  normocephalic, atraumatic, and no abnormalities observed.  R ethmoid and bilat frontal sinus tenderness  Eyes:  vision grossly intact, pupils equal, pupils round, pupils reactive to light, and no injection.   Ears:  R ear normal and L ear normal.    Nose:  nares are injected and congested bilaterally  Mouth:  pharynx pink and moist, no erythema, and no exudates.   Neck:  supple with full rom and no masses or thyromegally, no JVD or carotid bruit  Chest Wall:  No deformities, masses, or tenderness noted. Lungs:  Normal respiratory effort, chest expands symmetrically. Lungs are clear to auscultation, no crackles or wheezes. Heart:  Normal rate and regular rhythm. S1 and S2 normal without gallop, murmur, click, rub or other extra sounds. Abdomen:  soft, non-tender, and normal bowel sounds.   Msk:  No deformity or scoliosis noted of thoracic or lumbar spine.  no kyphosis  Pulses:  R and L carotid,radial,femoral,dorsalis pedis and posterior tibial pulses are full and equal bilaterally Extremities:  No clubbing, cyanosis, edema, or deformity noted with normal full range of motion of all joints.   Neurologic:  cranial nerves II-XII intact, sensation intact to light touch, gait normal, and DTRs symmetrical and normal.  no tremor  Skin:  Intact without suspicious lesions or rashes Cervical Nodes:  No lymphadenopathy noted Inguinal Nodes:  No significant adenopathy Psych:  normal affect, talkative and pleasant    Impression & Recommendations:  Problem # 1:  SINUSITIS, ACUTE (ICD-461.9) Assessment New  with R ethmoid and frontal sinus pain and purulent d/c tx with augmentin and update  The following medications were removed from the medication list:    Nasonex 50 Mcg/act Susp (Mometasone furoate) ..... Use 2 sprays each nostril once daily as needed Her updated medication list for this problem includes:    Advil Congestion Relief 10-200 Mg Tabs (Phenylephrine-ibuprofen) ..... Otc as directed.    Augmentin 875-125 Mg Tabs (Amoxicillin-pot clavulanate) .Marland Kitchen... 1 by mouth two times a day for 10 days for sinus infection  Orders: Prescription Created Electronically [phone])  Problem # 2:  INSOMNIA (ICD-780.52) Assessment: Unchanged ongoing -  rel to stress and lack of exercise trial of silenor- 6 mg and will update if she wants px has failed otcs if not imp will go back to alprazolam with warnings about habit forming potential  pt declines counseling at this time   Problem # 3:  OTHER SCREENING MAMMOGRAM (ICD-V76.12) Assessment: Comment Only annual mammogram scheduled adv pt to continue regular self breast exams non remarkable breast exam today  Orders: Radiology Referral (Radiology)  Problem # 4:  HEALTH MAINTENANCE EXAM (ICD-V70.0) Assessment: Comment Only reviewed health habits including diet, exercise and skin cancer prevention reviewed health maintenance list and family history I do recommend she get flu shot at pharmacy  rev wellness labs made plan for exercise   Problem # 5:  OSTEOPENIA (ICD-733.90) Assessment: Unchanged pt declines dexa this year urged to continue vit D(does not tol ca)  disc exercise and fx risk  Complete Medication List: 1)  Vitamin D  .... Daily 2)  Flexeril 10 Mg Tabs (Cyclobenzaprine hcl) .... 1/2  by mouth at bedtime as needed  3)  Alprazolam 0.5 Mg Tabs (Alprazolam) .Marland Kitchen.. 1 by mouth once daily as needed anxiety 4)  Advil Congestion Relief 10-200 Mg Tabs (Phenylephrine-ibuprofen) .... Otc as directed. 5)  Augmentin 875-125 Mg Tabs (Amoxicillin-pot clavulanate) .Marland Kitchen.. 1 by mouth two times a day for 10 days for sinus infection  Patient Instructions: 1)  take vitamin D at least 1000 international units daily for your bones  2)  take augmentin for sinus infection- update me if not improved  3)  also try saline nasal spray  4)  try the silenor for sleep - call me for px if it works  5)  we will refer for mammogram at check out Prescriptions: AUGMENTIN 875-125 MG TABS (AMOXICILLIN-POT CLAVULANATE) 1 by mouth two times a day for 10 days for sinus infection  #20 x 0   Entered and Authorized by:   Judith Part MD   Signed by:   Judith Part MD on 01/27/2010   Method used:    Electronically to        [facility] (retail)       [address], [address]       [place], [place]  [address]       Ph: [phone]       Fax: [phone]   RxID:   [id number]    Orders Added: 1)  Radiology Referral [Radiology] 2)  Prescription Created Electronically [[id number]] 3)  Est. Patient 40-64 years [99396] 4)  Est. Patient Level II [84132]    Current Allergies (reviewed today): ! Aleda Grana ! * CALCIUM ! PAXIL

## 2010-02-23 ENCOUNTER — Encounter (INDEPENDENT_AMBULATORY_CARE_PROVIDER_SITE_OTHER): Payer: Self-pay | Admitting: *Deleted

## 2010-03-04 NOTE — Miscellaneous (Signed)
Summary: Mammogram to flowsheet  Clinical Lists Changes  Observations: Added new observation of MAMMO DUE: 02/2011 (02/23/2010 9:36) Added new observation of MAMMOGRAM: normal (02/17/2010 9:36)      Preventive Care Screening  Mammogram:    Date:  02/17/2010    Next Due:  02/2011    Results:  normal

## 2010-03-04 NOTE — Letter (Signed)
Summary: Results Follow up Letter  [place] at [facility]  [address] [place], [place] [address]   Phone: [phone]  Fax: [phone]    02/23/2010 MRN: [id number]  Denise Grant [address] [place], [place]  [address]  Dear [name],  The following are the results of your recent test(s):  Test         Result    Pap Smear:        Normal _____  Not Normal _____ Comments: ______________________________________________________ Cholesterol: LDL(Bad cholesterol):         Your goal is less than:         HDL (Good cholesterol):       Your goal is more than: Comments:  ______________________________________________________ Mammogram:        Normal __X___  Not Normal _____ Comments: Repeat in 1 year.  ___________________________________________________________________ Hemoccult:        Normal _____  Not normal _______ Comments:    _____________________________________________________________________ Other Tests:    We routinely do not discuss normal results over the telephone.  If you desire a copy of the results, or you have any questions about this information we can discuss them at your next office visit.   Sincerely,     Sharilyn Sites for Dr. Roxy Manns

## 2010-06-04 NOTE — Op Note (Signed)
NAME:  Denise Grant, Denise Grant                         ACCOUNT NO.:  [id number]   MEDICAL RECORD NO.:  [id number]                   PATIENT TYPE:  INP   LOCATION:  2869                                 FACILITY:  [facility]   PHYSICIAN:  Hewitt Shorts, M.D.            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   OPERATION PERFORMED:  C5-6 anterior cervical diskectomy and arthrodesis with  iliac crest allograft and Trinica cervical plating.   SURGEON:  Hewitt Shorts, M.D.   ANESTHESIA:  General endotracheal.   INDICATIONS FOR PROCEDURE:  The patient is a 55 year old woman with neck  pain.  She was found to have a spondylitic disk herniation at the C5-6 level  with underlying degenerative disk disease.  Decision was made to proceed  with single level anterior cervical diskectomy and arthrodesis.   DESCRIPTION OF PROCEDURE:  The patient was brought to the operating room and  placed under general endotracheal anesthesia.  The patient was placed in 10  pounds of halter traction and the neck was prepped with Betadine soap and  solution and was draped in a sterile fashion.  A horizontal incision was  made on the left side of the neck.  The line of incision was infiltrated  with local anesthetic with epinephrine.  Dissection was carried down to the  subcutaneous tissue and platysma and then dissection was carried out to the  avascular plane leaving the sternocleidomastoid, carotid artery and jugular  vein laterally and trachea and esophagus medially.  Bipolar cautery was used  to maintain hemostasis.  An x-ray was taken, the C5-6 intervertebral disk  space identified and diskectomy  begun with incision of the annulus and  continued with microcurets and pituitary rongeurs.  The cartilaginous end  plates of the corresponding vertebra were removed using microcurets as well  as the Micromax drill and then The operating microscope was draped and  brought into the field to provide additional magnification, illumination and  visualization and the remainder of the diskectomy was performed using  microdissection and microsurgical technique.  The posterior osteophytic  overgrowth was removed using the Micromax drill along with the 2 mm Kerrison  punch with a thin foot plate.  The posterior longitudinal ligament was  thickened and was carefully removed and we were able to decompress the  spinal canal.  Dissection was carried out laterally and we were able to  decompress the foramina and nerve roots bilaterally and once the  decompression was completed, hemostasis was established with the use of  Gelfoam soaked in Thrombin.  Then we selected an 8 mm tricortical iliac  crest allograft.  We selected  the size based on trial spacers.  The graft  was hydrated in saline solution and positioned in the intervertebral disk  space and countersunk.  We then selected a 24 mm Trinica cervical plate.  The traction was discontinued.  It was positioned over the fusion construct  and secured to each of the vertebrae with a pair of 4.2 x 14 mm screws.  Each screw hole was drilled and tapped and the screw placed.  All four  screws were fully secured.  They were placed in alternating fashion and the  locking system was secured.  The wound was irrigated with bacitracin  solution and checked for hemostasis which was established and confirmed and  then we proceeded with closure.  The platysma was closed with interrupted  inverted 2-0 undyed Vicryl sutures.  The subcutaneous and subcuticular layer  were closed with interrupted inverted 3-0 undyed Vicryl sutures and skin  edges were approximated with  Dermabond.  The patient tolerated the procedure  well.  The estimated blood loss was less than 25 mL.  The sponge, needle and  instrument counts were correct.  Following surgery, the patient was to be  placed in a soft cervical collar, reversed from anesthetic, extubated and  transferred to the recovery room care.                                               Hewitt Shorts, M.D.    RWN/MEDQ  D:  02/06/2003  T:  02/06/2003  Job:  [id number]

## 2010-06-04 NOTE — Op Note (Signed)
[place]  Patient:    Denise Grant, Denise Grant Visit Number: [id number] MRN: [id number]          Service Type: SUR Location: 3100 [id number] Attending Physician:  Barton Fanny Dictated by:   Hewitt Shorts, M.D. Proc. Date: 06/21/01 Admit Date:  06/21/2001                             Operative Report  PREOPERATIVE DIAGNOSIS:  Chiari malformation.  POSTOPERATIVE DIAGNOSIS:  Chiari malformation.  PROCEDURES: 1. Suboccipital craniectomy. 2. C1 and partial C2 laminectomy and duraplasty with Duraguard.  SURGEON:  Hewitt Shorts, M.D.  ASSISTANT:  Stefani Dama, M.D.  ANESTHESIA:  General endotracheal.  INDICATIONS:  The patient is a 55 year old woman who presented with increasing headaches, shoulder and neck pain.  She was found to have a moderate-sized Chiari malformation.  The decision was made to proceed with decompression of the craniocervical junction and duraplasty.  DESCRIPTION OF PROCEDURE:  The patient was brought to the operating room and placed under general endotracheal anesthesia.  The patient was placed in a three-pin ________ head holder and then was turned to a prone position.  The occipital region was shaved, and then the occipital region and neck were prepped with Betadine soapy solution and injected in a sterile fashion.  The midline was infiltrated with local anesthetic with epinephrine, and a midline incision made and carried down to the subcutaneous tissue.  Bipolar cautery and electrocautery was used to maintain hemostasis.  Dissection was carried down through the midline to the occiput.  The posterior aspect of the ring of C1 and the spinous process of lamina of C2.  Subperiosteal elevation of the nuchal muscles was performed, and then a suboccipital craniectomy was performed using the Black Max shield and Kerrison punch.  The microscope was draped and brought into the field to provide additional  magnification and visualization, and the remainder of the procedure was performed using microdissection and microsurgical technique.  The posterior ring of C1 was removed using the [facility] Max and Kerrison punches, and the superior aspect of the lamina and spinous process of C2 was likewise removed with the [facility] Max drill and Kerrison punches.  The edges of the bone were waxed as necessary to maintain hemostasis.  Ligament of flavum was carefully resected and the dura was exposed.  The dura was opened in a wide shaped fashion, taking care to leave the underlying neural structures undisturbed.  Small bleeding vessels from the edge of the dura were coagulated as necessary.  The tonsils were noted to have distended to midway between the C1 and C2 lamina.  The dural opening extended another 5-7 mm below that level.  A 4 x 4 cm piece of DuraGuard was washed in saline, and then cut to size (essentially in a triangular shape).  It was sutured in place with running 4-0 Nurolon suture, and a good watertight closure was achieved.  We then checked for hemostasis. The wound was irrigated with Bacitracin solution, and then we proceeded with closure.  The nuchal muscles were approximated to the midline with interrupted undyed 0 Vicryl sutures.  The deep fascia was closed with interrupted undyed 0 Vicryl sutures.  The subcutaneous layer closed in inverted 2-0 and the exogenous skin was surgical staples.  The wound was dressed with Adaptic and sterile gauze.  The patient tolerated the procedure well.  ESTIMATED BLOOD LOSS:  200 cc.  DISPOSITION:  Sponge, needle and instrument was correct.   Following surgery the patient was turned back to the supine position.  The three-pin ________ head holder was removed.  The patient is to be reversed from anesthetic to be subsequently transferred to the recovery room, and then subsequently to the neurosurgical intensive care for further care. Dictated by:   Hewitt Shorts, M.D. Attending Physician:  Barton Fanny DD:  06/21/01 TD:  06/25/01 Job: [id number]

## 2010-06-04 NOTE — H&P (Signed)
[place]. [facility]  Patient:    Denise Grant, Denise Grant Visit Number: [id number] MRN: [id number]          Service Type: SUR Location: 3100 [id number] [id number] Attending Physician:  Barton Fanny Dictated by:   Hewitt Shorts, M.D. Admit Date:  06/21/2001                           History and Physical  HISTORY OF PRESENT ILLNESS:  The patient is a 55 year old, right-handed, white female who was evaluated for a Chiari malformation.  Three to four months ago, she developed pain in the back of her neck that extended up over her occiput to the vertex and frontal region.  She describes soreness in the shoulders that could be aggravated by bending over, squatting, coughing, or straining in any way.  Her symptoms began when she was working on her hair with her hands up over her shoulder.  She coughed and experienced exquisite pain.  She had an MRI done which showed a Chiari malformation, as well as some degenerative changes in the cervical spine.  The patient denies any diplopia, blurred vision, weakness, swallowing difficulties, and breathing difficulties.  She does have good hearing.  The patient is admitted now for decompression of her Chiari malformation.  PAST MEDICAL HISTORY:  She has no history of hypertension, myocardial infarction, cancer, stroke, diabetes, peptic ulcer disease, or lung disease.  PAST SURGICAL HISTORY:  Lumbar diskectomy in 1998 by Georges Lynch. Gioffre, M.D. and resection of right foot Mortons neuroma in February of 2003.  ALLERGIES:  She denies allergies to medications.  MEDICATIONS:  She is currently not taking any medications.  FAMILY HISTORY:  Her father died at age 59.  He had lung cancer.  Her mother died at age 57.  She had heart disease.  SOCIAL HISTORY:  The patient is married.  Her husband is a patient of mine. 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  She smokes about four cigarettes per day.  She does not  drink alcoholic beverages.  She denies a history of substance abuse.  REVIEW OF SYSTEMS:  Notable for that that was described in the history of present illness and past medical history.  Otherwise notable only for some tenderness and mild balance disturbance.  PHYSICAL EXAMINATION:  A well-developed, well-nourished, white female in no acute distress.  VITAL SIGNS:  Temperature 97.7 degrees, pulse 109, blood pressure 145/89, respiratory rate 20.  HEIGHT:  5 feet 7 inches.  WEIGHT:  147 pounds.  LUNGS:  Clear to auscultation.  She has symmetrical respiratory excursion.  HEART:  Regular rate and rhythm.  Normal S1 and S2 with no murmur.  ABDOMEN:  Soft and nondistended.  Bowel sounds are present.  EXTREMITIES:  No clubbing, cyanosis, or edema.  MUSCULOSKELETAL:  No tenderness to palpation over the cervical spinous process or paraspinous musculature.  She has good range of motion of the neck without significant discomfort on range of motion of the neck.  NEUROLOGIC:  On mental status, the patient is awake, alert, and fully oriented.  She has good comprehension.  Cranial nerves show the pupils to be equal, round, and reactive to light.  Extraocular movements are intact. Facial sensation and facial movement are symmetrical.  Hearing is intact bilaterally.  Palatal movement is symmetrical.  Shoulder shrug is symmetrical. The tongue protrudes in the midline.  The motor examination shows 5/5 strength throughout the upper and  lower extremities, including deltoids, biceps, triceps, intrinsics, grip, and iliopsoas.  Sensation is intact to pinprick to the upper and lower extremities.  Reflexes are 1 in the biceps, brachialis, triceps, quadriceps, and gastrocnemius and are symmetric bilaterally.  The toes are downgoing bilaterally.  She has a normal gait and stance.  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  PLAN:   The patient will be admitted for suboccipital craniectomy, upper cervical laminectomy, and duraplasty.  We discussed the nature of her condition, the nature of the recommended surgery, the typical length of surgery and hospital stay, overall recuperation and limitations postoperatively, the need for postoperative immobilization in a soft cervical collar, and risks, including the risk of infection, bleeding, and possible need of transfusion, the risk of neurologic dysfunction, including paralysis, coma, and breathing or swallowing difficulties, the risk of poor healing of the dural graft and possible need for further, and the anesthetic risk of myocardial infarction, stroke, pneumonia, and death.  After discussing all of this, she would like to go ahead with surgery and is admitted for such. Dictated by:   Hewitt Shorts, M.D. Attending Physician:  Barton Fanny DD:  06/21/01 TD:  06/22/01 Job: [id number]

## 2010-06-04 NOTE — Discharge Summary (Signed)
[facility]  Patient:    FREDERICKA, BOTTCHER Visit Number: [id number] MRN: [id number]          Service Type: SUR Location: [id number] Attending Physician:  Barton Fanny Dictated by:   Hewitt Shorts, M.D. Admit Date:  06/21/2001 Discharge Date: 06/26/2001                             Discharge Summary  ADMISSION HISTORY AND PHYSICAL EXAMINATION: The patient is a 55 year old woman who was admitted for decompression of a Chiari malformation.  She had been having neck pain with associated headaches going up over the occiput, vertex and into the frontal region, soreness into the shoulder that could be aggravated by bending over, squatting, coughing or straining.  Coughing caused exquisite pain. General examination was unremarkable.  Neurologic examination showed that she was neurologically intact.  HOSPITAL COURSE: The patient was admitted and underwent Chiari decompression via suboccipital craniectomy, C1 and partial C2 laminectomy and duraplasty with Duraguard.  Postoperatively she had a lot of nausea and vomiting initially that gradually eased.  We were gradually able to advance her diet. She was taking well p.o.  We were able to discontinue her IV and she began to ambulate in the unit. Her Foley catheter was discontinued. As she made progress, she required less medication.  At this time she is up and ambulating with minimal discomfort.  She is afebrile.  Her wound is healing nicely. There is no erythema, swelling or drainage.  She is being discharged to home with instructions regarding wound care and activity.  FOLLOW-UP: She is to return to my office in one week for staple removal.  DISCHARGE MEDICATIONS: 1. Vicodin one to two tablets p.o. q.4. to 6h p.r.n. pain, 30 tablets and no    refills. 2. Flexeril 10 mg t.i.d. p.r.n. muscle spasms, 30 tablets and two refills.  DISCHARGE DIAGNOSIS: Chiari malformation.  Dictated by:    Hewitt Shorts, M.D. Attending Physician:  Barton Fanny DD:  06/26/01 TD:  06/27/01 Job: [id number]

## 2010-11-18 ENCOUNTER — Other Ambulatory Visit (INDEPENDENT_AMBULATORY_CARE_PROVIDER_SITE_OTHER): Payer: Managed Care, Other (non HMO)

## 2010-11-18 ENCOUNTER — Telehealth: Payer: Self-pay | Admitting: Family Medicine

## 2010-11-18 DIAGNOSIS — M899 Disorder of bone, unspecified: Secondary | ICD-10-CM

## 2010-11-18 DIAGNOSIS — M949 Disorder of cartilage, unspecified: Secondary | ICD-10-CM

## 2010-11-18 DIAGNOSIS — Z0000 Encounter for general adult medical examination without abnormal findings: Secondary | ICD-10-CM

## 2010-11-18 LAB — TSH: TSH: 3.14 u[IU]/mL (ref 0.35–5.50)

## 2010-11-18 LAB — CBC WITH DIFFERENTIAL/PLATELET
Eosinophils Relative: 0.8 % (ref 0.0–5.0)
HCT: 39.2 % (ref 36.0–46.0)
Hemoglobin: 13.5 g/dL (ref 12.0–15.0)
Lymphs Abs: 1.1 10*3/uL (ref 0.7–4.0)
MCV: 91.2 fl (ref 78.0–100.0)
Monocytes Absolute: 0.3 10*3/uL (ref 0.1–1.0)
Monocytes Relative: 7.3 % (ref 3.0–12.0)
Neutro Abs: 2.1 10*3/uL (ref 1.4–7.7)
Platelets: 164 10*3/uL (ref 150.0–400.0)
RDW: 13.6 % (ref 11.5–14.6)
WBC: 3.5 10*3/uL — ABNORMAL LOW (ref 4.5–10.5)

## 2010-11-18 LAB — COMPREHENSIVE METABOLIC PANEL
AST: 32 U/L (ref 0–37)
Albumin: 4.5 g/dL (ref 3.5–5.2)
BUN: 15 mg/dL (ref 6–23)
Calcium: 9.8 mg/dL (ref 8.4–10.5)
Chloride: 108 mEq/L (ref 96–112)
Potassium: 4.7 mEq/L (ref 3.5–5.1)

## 2010-11-18 LAB — LIPID PANEL
Total CHOL/HDL Ratio: 3
Triglycerides: 46 mg/dL (ref 0.0–149.0)

## 2010-11-18 NOTE — Telephone Encounter (Signed)
Message copied by Judy Pimple on Thu Nov 18, 2010  9:11 AM ------      Message from: Alvina Chou      Created: Wed Nov 17, 2010 12:55 PM      Regarding: labs for Thursday, 11-18-10       Patient is scheduled for CPX labs, please order future labs, Thanks , Camelia Eng

## 2010-11-19 LAB — VITAMIN D 25 HYDROXY (VIT D DEFICIENCY, FRACTURES): Vit D, 25-Hydroxy: 42 ng/mL (ref 30–89)

## 2010-11-23 ENCOUNTER — Encounter: Payer: Self-pay | Admitting: Family Medicine

## 2010-11-24 ENCOUNTER — Other Ambulatory Visit (HOSPITAL_COMMUNITY)
Admission: RE | Admit: 2010-11-24 | Discharge: 2010-11-24 | Disposition: A | Discharge status: Home / Self Care | Payer: Managed Care, Other (non HMO) | Source: Ambulatory Visit | Attending: Family Medicine | Admitting: Family Medicine

## 2010-11-24 ENCOUNTER — Encounter: Payer: Self-pay | Admitting: Family Medicine

## 2010-11-24 ENCOUNTER — Ambulatory Visit (INDEPENDENT_AMBULATORY_CARE_PROVIDER_SITE_OTHER): Payer: Managed Care, Other (non HMO) | Admitting: Family Medicine

## 2010-11-24 VITALS — BP 120/76 | HR 84 | Temp 98.4°F | Ht 65.5 in | Wt 147.5 lb

## 2010-11-24 DIAGNOSIS — Z1159 Encounter for screening for other viral diseases: Secondary | ICD-10-CM | POA: Insufficient documentation

## 2010-11-24 DIAGNOSIS — Z23 Encounter for immunization: Secondary | ICD-10-CM

## 2010-11-24 DIAGNOSIS — Z0000 Encounter for general adult medical examination without abnormal findings: Secondary | ICD-10-CM

## 2010-11-24 DIAGNOSIS — Z01419 Encounter for gynecological examination (general) (routine) without abnormal findings: Secondary | ICD-10-CM | POA: Insufficient documentation

## 2010-11-24 DIAGNOSIS — M899 Disorder of bone, unspecified: Secondary | ICD-10-CM

## 2010-11-24 DIAGNOSIS — F4321 Adjustment disorder with depressed mood: Secondary | ICD-10-CM

## 2010-11-24 NOTE — Patient Instructions (Signed)
Flu shot today  You are due for a mammogram in feb- if you need a referral call us to schedule it  Keep up the good health habits Please let me know if you want to have any grief counseling  Did pap today Labs are ok

## 2010-11-24 NOTE — Progress Notes (Signed)
Subjective:    Patient ID: Denise Grant, female    DOB: 1955-11-23, 55 y.o.   MRN: [id number]  HPI Here for annual health mt exam and also to review chronic med problems Is feeling pretty good  No new med problems  Had successful cataract surgery - and another one planned  Lost her husband in April to stomach cancer Was tough- is getting by with grief  Overall not needing counseling but has hospice resources if she needs them   bp good 120/76 Wt down 2 lb with bmi of 24  Labs ok  Good lipids Lab Results  Component Value Date   CHOL 160 11/18/2010   HDL 57.50 11/18/2010   LDLCALC 93 11/18/2010   TRIG 46.0 11/18/2010   CHOLHDL 3 11/18/2010  she does try to eat a healthy diet most of the time - no fast food/ fried things    Vit D 42 stable Does not tolerate ca at all  Takes 600 iu vit D  Hx of osteopenia  dexa was ? 09 She does not want to do more dexas at this point - declines it   Mam 2/12 normal Self exam no lumps or changes    Sister has melanoma Goes once a year to derm for yearly exam - has upcoming appt  Has eczema but no abn moles   Pap 9/09 Has asymptomatic vaginal cyst that has been there for years  Is due for her 3 year exam  No new partners No symptoms   Colon screen Is not interested yet  Will do stool card - ifob   Flu shot- has never taken one - but will go ahead and take it  Td 3/04   Patient Active Problem List  Diagnoses  . ANXIETY  . MITRAL VALVE PROLAPSE  . ALLERGIC RHINITIS  . FIBROCYSTIC BREAST DISEASE  . SEBORRHEIC DERMATITIS  . CONTACT DERMATITIS-NOS  . KERATOSIS, ACTINIC  . NECK PAIN, CHRONIC  . OSTEOPENIA  . HEADACHE  . INSOMNIA  . Routine general medical examination at a health care facility  . Gynecological examination  . Grief   Past Medical History  Diagnosis Date  . Allergic rhinitis   . Anxiety   . Osteopenia   . DDD (degenerative disc disease)   . Lactose intolerance   . Vaginal cyst     stable  . Diffuse  cystic mastopathy   . Headache   . Insomnia, unspecified   . Actinic keratosis   . Family history of other specified malignant neoplasm   . Mitral valve disorders   . Seborrheic dermatitis, unspecified   . Chiari malformation    Past Surgical History  Procedure Date  . Tubal ligation   . Lumbar disc surgery   . Chiari malformation surgery 6/03  . Excision morton's neuroma 2/04  . Bone spur removal 10/06    shoulder  . Cervical disc surgery 2005   History  Substance Use Topics  . Smoking status: Former Smoker    Quit date: 01/17/2001  . Smokeless tobacco: Not on file  . Alcohol Use: No   Family History  Problem Relation Age of Onset  . Melanoma Sister   . Lung cancer Father   . Coronary artery disease Mother   . Heart failure Mother   . Hypertension Mother   . Anxiety disorder Son   . Lung cancer Brother     + smoker   Allergies  Allergen Reactions  . Calcium  REACTION: cannot tolerate any calcium supplement - GI sympotms  . Fluticasone Propionate     REACTION: headache/irritation  . Paroxetine     REACTION: abdominal pain and constipation   Current Outpatient Prescriptions on File Prior to Visit  Medication Sig Dispense Refill  . ALPRAZolam (XANAX) 0.5 MG tablet Take 0.5 mg by mouth daily as needed.        . cyclobenzaprine (FLEXERIL) 10 MG tablet Take 5 mg by mouth at bedtime as needed.               Review of Systems Review of Systems  Constitutional: Negative for fever, appetite change, fatigue and unexpected weight change.  Eyes: Negative for pain and visual disturbance.  Respiratory: Negative for cough and shortness of breath.   Cardiovascular: Negative for cp or palpitations    Gastrointestinal: Negative for nausea, diarrhea and constipation.  Genitourinary: Negative for urgency and frequency.  Skin: Negative for pallor or rash   Neurological: Negative for weakness, light-headedness, numbness and headaches.  Hematological: Negative for  adenopathy. Does not bruise/bleed easily.  Psychiatric/Behavioral: Negative for dysphoric mood. The patient is not nervous/anxious. Is dealing with grief          Objective:   Physical Exam  Constitutional: She appears well-developed and well-nourished. No distress.  HENT:  Head: Normocephalic and atraumatic.  Right Ear: External ear normal.  Left Ear: External ear normal.  Nose: Nose normal.  Mouth/Throat: Oropharynx is clear and moist.  Eyes: Conjunctivae and EOM are normal. Pupils are equal, round, and reactive to light. No scleral icterus.  Neck: Normal range of motion. Neck supple. No JVD present. Carotid bruit is not present. Erythema present. No thyromegaly present.  Cardiovascular: Normal rate, regular rhythm, normal heart sounds and intact distal pulses.  Exam reveals no gallop.   Pulmonary/Chest: Effort normal and breath sounds normal. No respiratory distress. She has no rales. She exhibits no tenderness.  Abdominal: Soft. Bowel sounds are normal. She exhibits no distension and no mass. There is no tenderness. There is no guarding.  Genitourinary: Uterus normal. No breast swelling, tenderness, discharge or bleeding. No vaginal discharge found.       Large anterior stable soft vaginal cyst - nontender Pap obtained   Musculoskeletal: Normal range of motion. She exhibits no edema and no tenderness.  Lymphadenopathy:    She has no cervical adenopathy.  Neurological: She is alert. She has normal reflexes. No cranial nerve deficit. She exhibits normal muscle tone. Coordination normal.  Skin: Skin is warm and dry. No rash noted. No erythema. No pallor.  Psychiatric: She has a normal mood and affect.          Assessment & Plan:

## 2010-11-25 NOTE — Assessment & Plan Note (Signed)
Declines dexa or bone building tx at this time Disc fx risk Rev ca and vit D req and exercise

## 2010-11-25 NOTE — Assessment & Plan Note (Signed)
Annual exam with pap done  Vaginal cyst is stable- and not bothersome

## 2010-11-25 NOTE — Assessment & Plan Note (Signed)
Overall doing well after loosing her husband to cancer in the spring Offered counseling ref any time she needs it  Has good family support

## 2010-11-25 NOTE — Assessment & Plan Note (Signed)
Reviewed health habits including diet and exercise and skin cancer prevention Also reviewed health mt list, fam hx and immunizations   Rev wellness labs  

## 2010-12-03 ENCOUNTER — Encounter: Payer: Self-pay | Admitting: *Deleted

## 2011-03-02 ENCOUNTER — Ambulatory Visit: Payer: Self-pay | Admitting: Family Medicine

## 2011-03-08 ENCOUNTER — Encounter: Payer: Self-pay | Admitting: *Deleted

## 2011-03-10 ENCOUNTER — Encounter: Payer: Self-pay | Admitting: Family Medicine

## 2012-04-11 ENCOUNTER — Telehealth: Payer: Self-pay | Admitting: Family Medicine

## 2012-04-11 DIAGNOSIS — Z0000 Encounter for general adult medical examination without abnormal findings: Secondary | ICD-10-CM

## 2012-04-11 DIAGNOSIS — M899 Disorder of bone, unspecified: Secondary | ICD-10-CM

## 2012-04-11 NOTE — Telephone Encounter (Signed)
Message copied by Judy Pimple on Wed Apr 11, 2012  8:30 AM ------      Message from: Alvina Chou      Created: Mon Apr 09, 2012  4:02 PM      Regarding: Lab orders for Thursday, 3.27.14       Patient is scheduled for CPX labs, please order future labs, Thanks , Terri       ------

## 2012-04-13 ENCOUNTER — Other Ambulatory Visit (INDEPENDENT_AMBULATORY_CARE_PROVIDER_SITE_OTHER): Payer: Managed Care, Other (non HMO)

## 2012-04-13 DIAGNOSIS — Z0000 Encounter for general adult medical examination without abnormal findings: Secondary | ICD-10-CM

## 2012-04-13 DIAGNOSIS — M899 Disorder of bone, unspecified: Secondary | ICD-10-CM

## 2012-04-13 DIAGNOSIS — M949 Disorder of cartilage, unspecified: Secondary | ICD-10-CM

## 2012-04-13 LAB — COMPREHENSIVE METABOLIC PANEL
ALT: 27 U/L (ref 0–35)
CO2: 29 mEq/L (ref 19–32)
Calcium: 9.3 mg/dL (ref 8.4–10.5)
Chloride: 106 mEq/L (ref 96–112)
Creatinine, Ser: 0.8 mg/dL (ref 0.4–1.2)
GFR: 80.6 mL/min (ref >60.00)
Total Protein: 7.2 g/dL (ref 6.0–8.3)

## 2012-04-13 LAB — TSH: TSH: 3.48 u[IU]/mL (ref 0.35–5.50)

## 2012-04-13 LAB — CBC WITH DIFFERENTIAL/PLATELET
Basophils Absolute: 0 10*3/uL (ref 0.0–0.1)
Basophils Relative: 0.6 % (ref 0.0–3.0)
Hemoglobin: 13.8 g/dL (ref 12.0–15.0)
Lymphocytes Relative: 25.2 % (ref 12.0–46.0)
Monocytes Relative: 6.5 % (ref 3.0–12.0)
Neutro Abs: 2.6 10*3/uL (ref 1.4–7.7)
RBC: 4.49 Mil/uL (ref 3.87–5.11)
RDW: 13.1 % (ref 11.5–14.6)
WBC: 3.9 10*3/uL — ABNORMAL LOW (ref 4.5–10.5)

## 2012-04-13 LAB — LIPID PANEL: HDL: 56.4 mg/dL (ref >39.00)

## 2012-04-13 NOTE — Addendum Note (Signed)
Addended by: Baldomero Lamy on: 04/13/2012 07:40 AM   Modules accepted: Orders

## 2012-04-14 LAB — VITAMIN D 25 HYDROXY (VIT D DEFICIENCY, FRACTURES): Vit D, 25-Hydroxy: 30 ng/mL (ref 30–89)

## 2012-04-20 ENCOUNTER — Encounter: Payer: Self-pay | Admitting: Family Medicine

## 2012-04-20 ENCOUNTER — Ambulatory Visit (INDEPENDENT_AMBULATORY_CARE_PROVIDER_SITE_OTHER): Payer: Managed Care, Other (non HMO) | Admitting: Family Medicine

## 2012-04-20 VITALS — BP 130/80 | HR 87 | Temp 98.5°F | Ht 65.75 in | Wt 151.0 lb

## 2012-04-20 DIAGNOSIS — M899 Disorder of bone, unspecified: Secondary | ICD-10-CM

## 2012-04-20 DIAGNOSIS — Z0000 Encounter for general adult medical examination without abnormal findings: Secondary | ICD-10-CM

## 2012-04-20 DIAGNOSIS — F411 Generalized anxiety disorder: Secondary | ICD-10-CM

## 2012-04-20 DIAGNOSIS — Z1211 Encounter for screening for malignant neoplasm of colon: Secondary | ICD-10-CM | POA: Insufficient documentation

## 2012-04-20 DIAGNOSIS — M949 Disorder of cartilage, unspecified: Secondary | ICD-10-CM

## 2012-04-20 MED ORDER — LORAZEPAM 1 MG PO TABS: 0.5000 mg | ORAL_TABLET | Freq: Every evening | ORAL | Status: DC | PRN

## 2012-04-20 NOTE — Assessment & Plan Note (Signed)
Refilled lorazepam for scant prn use - doing well overall

## 2012-04-20 NOTE — Progress Notes (Signed)
Subjective:    Patient ID: Denise Grant, female    DOB: 06-17-1955, 57 y.o.   MRN: [id number]  HPI Here for health maintenance exam and to review chronic medical problems  Is doing fairly well overall  Nothing new medically  Had cataract surgery done -now has floaters    Wt is up 4 lb with bmi of 24 Overall stable  Eats a very healthy diet and gets exercise    Colon cancer screen-no interested in colonoscopy (does not do well with anesthesia)  Will do IFOB  Td 04- is due for one  Does not want it today - because it makes her arm sore- and she is going out of town   mammo 2/13- will schedule at [facility] Self exam- no lumps or changes   Pap 11/12 nl No symptoms or problems and no new partners   Flu vaccine-did have it at work in the fall   Osteopenia - last dexa  No fractures  09 dexa D level is 30 - does not take her D every day (with chewed calcium)  Mood -good/ not depressed or unmotivated for the most part  Does get anxious at night   Cholesterol Lab Results  Component Value Date   CHOL 162 04/13/2012   CHOL 160 11/18/2010   CHOL 178 01/13/2010   Lab Results  Component Value Date   HDL 56.40 04/13/2012   HDL 40.98 11/18/2010   HDL 11.91 01/13/2010   Lab Results  Component Value Date   LDLCALC 96 04/13/2012   LDLCALC 93 11/18/2010   LDLCALC 113* 01/13/2010   Lab Results  Component Value Date   TRIG 48.0 04/13/2012   TRIG 46.0 11/18/2010   TRIG 50.0 01/13/2010   Lab Results  Component Value Date   CHOLHDL 3 04/13/2012   CHOLHDL 3 11/18/2010   CHOLHDL 3 01/13/2010   No results found for this basename: LDLDIRECT   very good cholesterol profile  Anxiety-- needs refil of lorazepam- needs it sparingly  Patient Active Problem List  Diagnosis  . ANXIETY  . MITRAL VALVE PROLAPSE  . ALLERGIC RHINITIS  . FIBROCYSTIC BREAST DISEASE  . SEBORRHEIC DERMATITIS  . CONTACT DERMATITIS-NOS  . KERATOSIS, ACTINIC  . NECK PAIN, CHRONIC  . OSTEOPENIA  . HEADACHE   . INSOMNIA  . Routine general medical examination at a health care facility  . Gynecological examination  . Grief  . Colon cancer screening   Past Medical History  Diagnosis Date  . Allergic rhinitis   . Anxiety   . Osteopenia   . DDD (degenerative disc disease)   . Lactose intolerance   . Vaginal cyst     stable  . Diffuse cystic mastopathy   . Headache   . Insomnia, unspecified   . Actinic keratosis   . Family history of other specified malignant neoplasm   . Mitral valve disorders   . Seborrheic dermatitis, unspecified   . Chiari malformation    Past Surgical History  Procedure Laterality Date  . Tubal ligation    . Lumbar disc surgery    . Chiari malformation surgery  6/03  . Excision morton's neuroma  2/04  . Bone spur removal  10/06    shoulder  . Cervical disc surgery  2005   History  Substance Use Topics  . Smoking status: Former Smoker    Quit date: 01/17/2001  . Smokeless tobacco: Not on file  . Alcohol Use: No   Family History  Problem Relation Age of  Onset  . Melanoma Sister   . Lung cancer Father   . Coronary artery disease Mother   . Heart failure Mother   . Hypertension Mother   . Anxiety disorder Son   . Lung cancer Brother     + smoker   Allergies  Allergen Reactions  . Calcium     REACTION: cannot tolerate any calcium supplement - GI sympotms  . Fluticasone Propionate     REACTION: headache/irritation  . Paroxetine     REACTION: abdominal pain and constipation   Current Outpatient Prescriptions on File Prior to Visit  Medication Sig Dispense Refill  . cyclobenzaprine (FLEXERIL) 10 MG tablet Take 5 mg by mouth at bedtime as needed.         No current facility-administered medications on file prior to visit.    Review of Systems Review of Systems  Constitutional: Negative for fever, appetite change, fatigue and unexpected weight change.  Eyes: Negative for pain and visual disturbance.  Respiratory: Negative for cough and  shortness of breath.   Cardiovascular: Negative for cp or palpitations    Gastrointestinal: Negative for nausea, diarrhea and constipation.  Genitourinary: Negative for urgency and frequency.  Skin: Negative for pallor or rash   Neurological: Negative for weakness, light-headedness, numbness and headaches.  Hematological: Negative for adenopathy. Does not bruise/bleed easily.  Psychiatric/Behavioral: Negative for dysphoric mood. The patient is at times  nervous/anxious.         Objective:   Physical Exam  Constitutional: She appears well-developed and well-nourished. No distress.  HENT:  Head: Normocephalic and atraumatic.  Right Ear: External ear normal.  Left Ear: External ear normal.  Nose: Nose normal.  Mouth/Throat: Oropharynx is clear and moist.  Eyes: Conjunctivae and EOM are normal. Pupils are equal, round, and reactive to light. Right eye exhibits no discharge. Left eye exhibits no discharge. No scleral icterus.  Neck: Normal range of motion. Neck supple. No JVD present. Carotid bruit is not present. No thyromegaly present.  Cardiovascular: Normal rate, regular rhythm, normal heart sounds and intact distal pulses.  Exam reveals no gallop.   Pulmonary/Chest: Effort normal and breath sounds normal. No respiratory distress. She has no wheezes.  Abdominal: Soft. Bowel sounds are normal. She exhibits no distension, no abdominal bruit and no mass. There is no tenderness.  Genitourinary: No breast swelling, tenderness, discharge or bleeding.  Breast exam: No mass, nodules, thickening, tenderness, bulging, retraction, inflamation, nipple discharge or skin changes noted.  No axillary or clavicular LA.  Chaperoned exam.    Musculoskeletal: She exhibits no edema and no tenderness.  Lymphadenopathy:    She has no cervical adenopathy.  Neurological: She is alert. She has normal reflexes. No cranial nerve deficit. She exhibits normal muscle tone. Coordination normal.  Skin: Skin is warm and  dry. No rash noted. No erythema. No pallor.  Psychiatric: She has a normal mood and affect.          Assessment & Plan:

## 2012-04-20 NOTE — Assessment & Plan Note (Signed)
Due for dexa F/u  elam  Inc D to 1000 iu daily

## 2012-04-20 NOTE — Assessment & Plan Note (Signed)
Reviewed health habits including diet and exercise and skin cancer prevention Also reviewed health mt list, fam hx and immunizations  Rev wellness lab in detail

## 2012-04-20 NOTE — Patient Instructions (Addendum)
Schedule a nurse appt to return for Tdap vaccine when you are able  Don't forget to make your annual mammogram appt  We will schedule bone density test at check out  Try to get 1000 iu of vitamin D each day

## 2012-04-20 NOTE — Assessment & Plan Note (Signed)
Pt declines colonoscopy Will do IFOB

## 2012-04-24 ENCOUNTER — Ambulatory Visit: Payer: Self-pay | Admitting: Family Medicine

## 2012-04-24 ENCOUNTER — Encounter: Payer: Self-pay | Admitting: Family Medicine

## 2012-04-24 IMAGING — MG MM CAD SCREENING MAMMO
1 series · 5 of 5 positions shown · non-contrast
Comparison: none

REASON FOR EXAM: SCR MAMMO NO ORDER
COMMENTS:

PROCEDURE:     MAM - MAM DGTL SCRN MAM NO ORDER W/CAD  - [DATE]  [DATE]
RESULT:     COMPARISON: [DATE], [DATE], [DATE]
TECHNIQUE: Digital screening mammograms were obtained. FDA approved
computer-aided detection (CAD) for mammography was utilized for this study.
BREAST COMPOSITION: The breast composition is HETEROGENEOUSLY DENSE
(glandular tissue is 51-75%). This may decrease the sensitivity of
mammography.

[R CC · right · 5 of 5 slices shown]
[im 1/5]
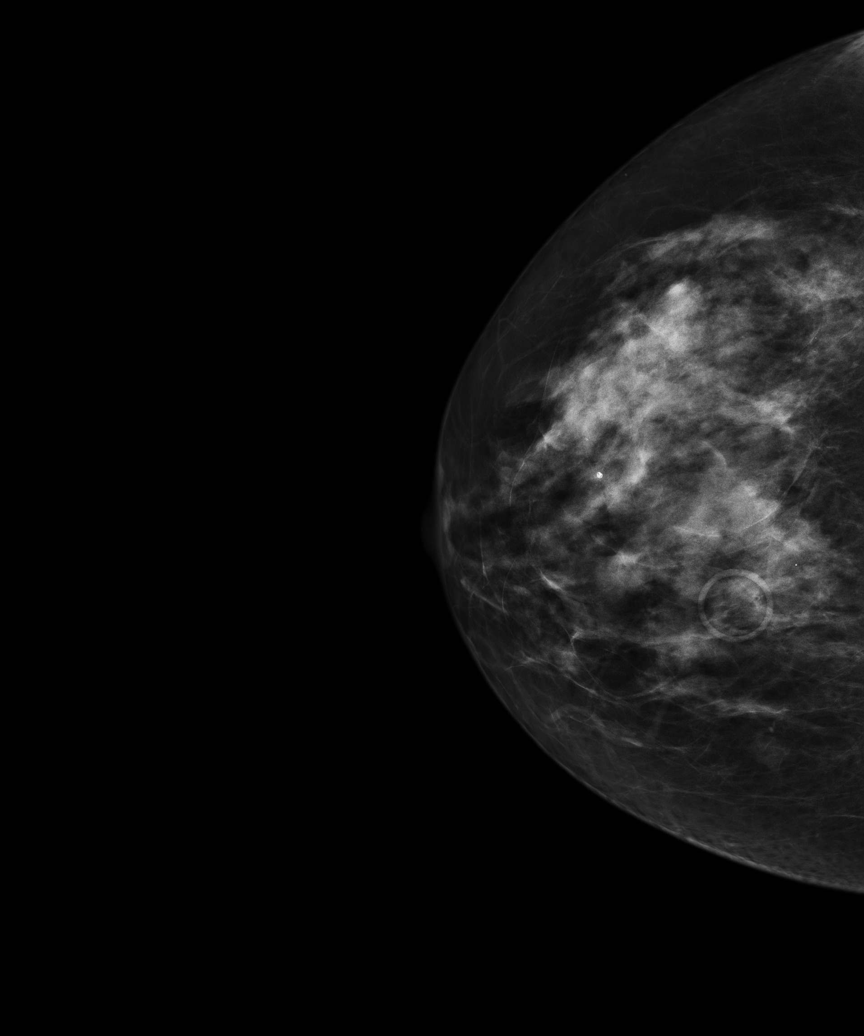
[im 2/5]
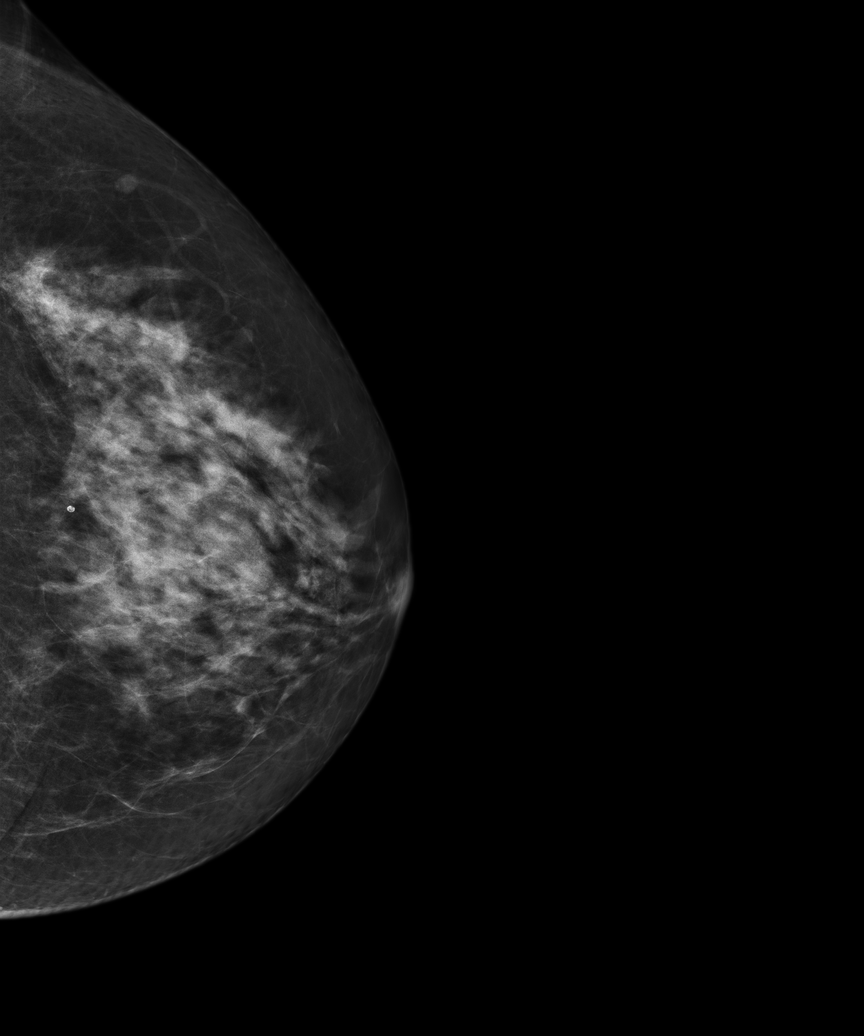
[im 3/5]
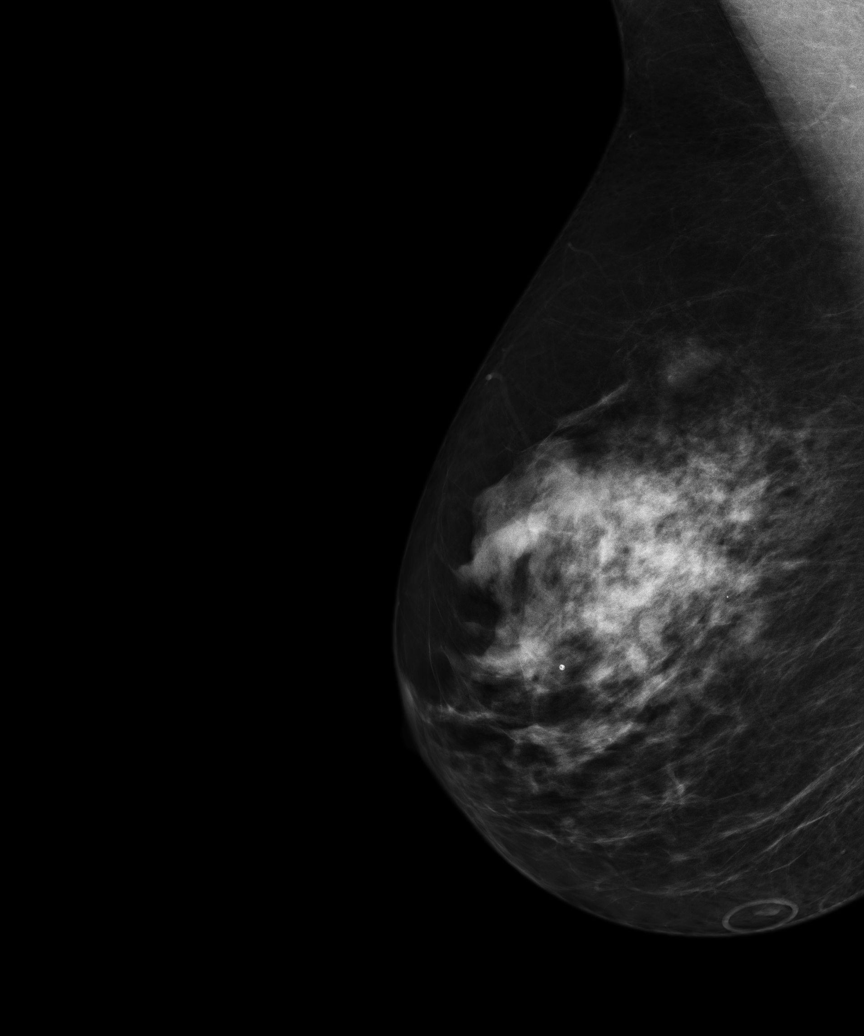
[im 4/5]
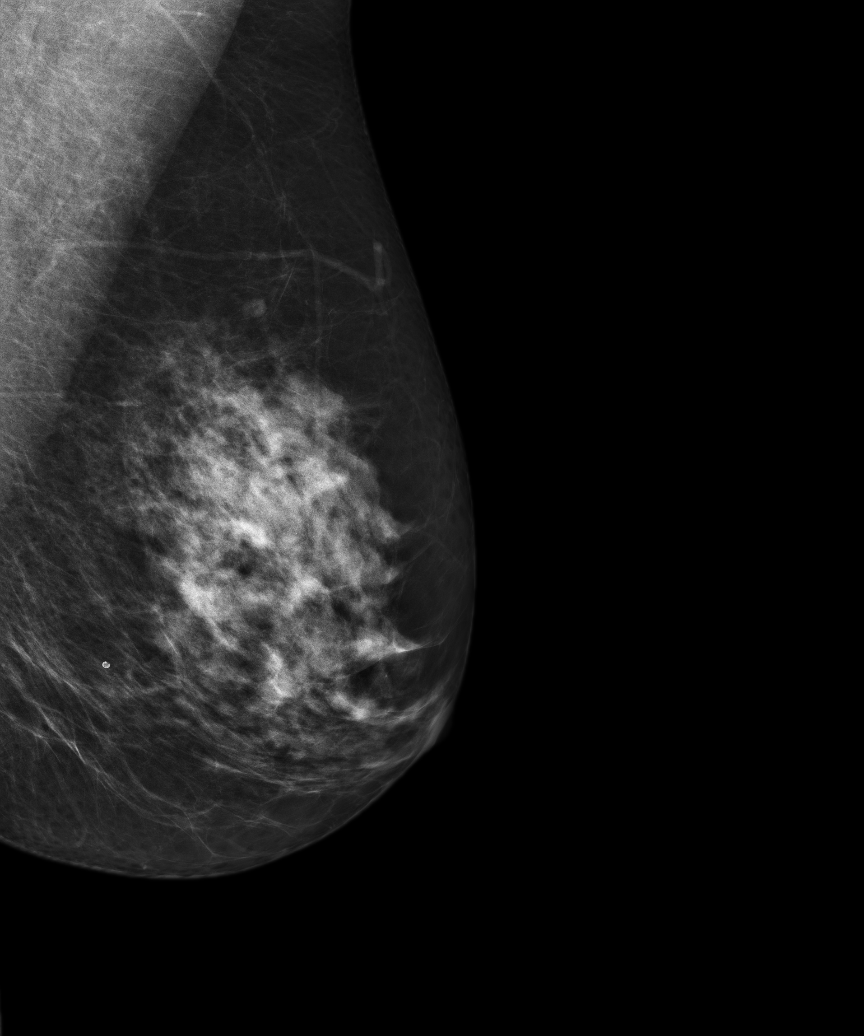
[im 5/5]
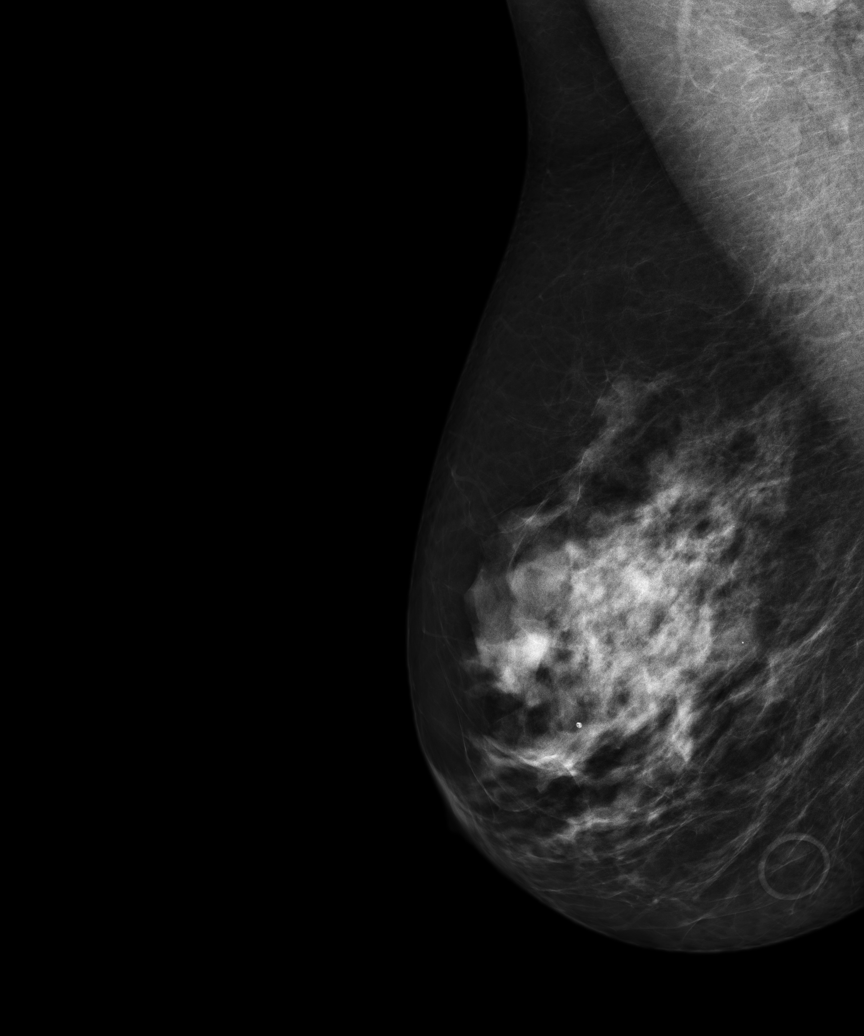

[5 of 5 positions shown; findings below may reference images not displayed]

FINDING: There is no dominant mass, architectural distortion or clusters of
suspicious microcalcifications.
IMPRESSION: 1.     Stable bilateral mammogram.
2.     Annual mammographic follow up recommended.

BI-RADS:  Category 2- Benign.

A negative mammogram report does not preclude biopsy or other evaluation of
a clinically palpable or otherwise suspicious mass or lesion. Breast cancer
may not be detected by mammography in up to 10% of cases.

[REDACTED]

## 2012-04-25 ENCOUNTER — Encounter: Payer: Self-pay | Admitting: *Deleted

## 2012-05-01 ENCOUNTER — Other Ambulatory Visit (INDEPENDENT_AMBULATORY_CARE_PROVIDER_SITE_OTHER): Payer: Managed Care, Other (non HMO)

## 2012-05-01 DIAGNOSIS — Z1211 Encounter for screening for malignant neoplasm of colon: Secondary | ICD-10-CM

## 2012-05-02 ENCOUNTER — Encounter: Payer: Self-pay | Admitting: *Deleted

## 2012-05-11 ENCOUNTER — Ambulatory Visit: Payer: Managed Care, Other (non HMO) | Admitting: Family Medicine

## 2012-05-11 ENCOUNTER — Ambulatory Visit (INDEPENDENT_AMBULATORY_CARE_PROVIDER_SITE_OTHER): Payer: Managed Care, Other (non HMO) | Admitting: *Deleted

## 2012-05-11 DIAGNOSIS — Z23 Encounter for immunization: Secondary | ICD-10-CM

## 2012-05-28 ENCOUNTER — Ambulatory Visit: Payer: Self-pay | Admitting: Family Medicine

## 2012-05-28 LAB — HM DEXA SCAN

## 2012-05-29 ENCOUNTER — Encounter: Payer: Self-pay | Admitting: Family Medicine

## 2012-06-01 ENCOUNTER — Encounter: Payer: Self-pay | Admitting: Family Medicine

## 2012-06-01 ENCOUNTER — Encounter: Payer: Self-pay | Admitting: *Deleted

## 2013-04-14 ENCOUNTER — Telehealth: Payer: Self-pay | Admitting: Family Medicine

## 2013-04-14 DIAGNOSIS — M899 Disorder of bone, unspecified: Secondary | ICD-10-CM

## 2013-04-14 DIAGNOSIS — M949 Disorder of cartilage, unspecified: Principal | ICD-10-CM

## 2013-04-14 DIAGNOSIS — Z0000 Encounter for general adult medical examination without abnormal findings: Secondary | ICD-10-CM

## 2013-04-14 NOTE — Telephone Encounter (Signed)
Message copied by Abner Greenspan on Sun Apr 14, 2013 11:50 AM ------      Message from: Selinda Orion J      Created: Mon Apr 08, 2013 12:21 PM      Regarding: Lab orders for Monday,3.30.15       Patient is scheduled for CPX labs, please order future labs, Thanks , Terri       ------

## 2013-04-15 ENCOUNTER — Other Ambulatory Visit (INDEPENDENT_AMBULATORY_CARE_PROVIDER_SITE_OTHER): Payer: Managed Care, Other (non HMO)

## 2013-04-15 ENCOUNTER — Encounter: Payer: Self-pay | Admitting: Family Medicine

## 2013-04-15 ENCOUNTER — Ambulatory Visit (INDEPENDENT_AMBULATORY_CARE_PROVIDER_SITE_OTHER): Payer: Managed Care, Other (non HMO) | Admitting: Family Medicine

## 2013-04-15 VITALS — BP 138/80 | HR 100 | Temp 97.8°F | Wt 147.5 lb

## 2013-04-15 DIAGNOSIS — R8290 Unspecified abnormal findings in urine: Secondary | ICD-10-CM

## 2013-04-15 DIAGNOSIS — M899 Disorder of bone, unspecified: Secondary | ICD-10-CM

## 2013-04-15 DIAGNOSIS — Z0000 Encounter for general adult medical examination without abnormal findings: Secondary | ICD-10-CM

## 2013-04-15 DIAGNOSIS — R82998 Other abnormal findings in urine: Secondary | ICD-10-CM

## 2013-04-15 DIAGNOSIS — M949 Disorder of cartilage, unspecified: Principal | ICD-10-CM

## 2013-04-15 DIAGNOSIS — R3129 Other microscopic hematuria: Secondary | ICD-10-CM | POA: Insufficient documentation

## 2013-04-15 LAB — POCT URINALYSIS DIPSTICK
BILIRUBIN UA: NEGATIVE
Glucose, UA: NEGATIVE
Ketones, UA: NEGATIVE
NITRITE UA: NEGATIVE
Protein, UA: NEGATIVE
Spec Grav, UA: 1.015
UROBILINOGEN UA: 0.2
pH, UA: 6

## 2013-04-15 LAB — COMPREHENSIVE METABOLIC PANEL
ALT: 27 U/L (ref 0–35)
AST: 32 U/L (ref 0–37)
Albumin: 4.6 g/dL (ref 3.5–5.2)
Alkaline Phosphatase: 52 U/L (ref 39–117)
BUN: 14 mg/dL (ref 6–23)
CALCIUM: 9.7 mg/dL (ref 8.4–10.5)
CO2: 27 meq/L (ref 19–32)
CREATININE: 0.8 mg/dL (ref 0.4–1.2)
Chloride: 104 mEq/L (ref 96–112)
GFR: 76.9 mL/min (ref >60.00)
Glucose, Bld: 100 mg/dL — ABNORMAL HIGH (ref 70–99)
Potassium: 3.9 mEq/L (ref 3.5–5.1)
Sodium: 137 mEq/L (ref 135–145)
Total Bilirubin: 0.8 mg/dL (ref 0.3–1.2)
Total Protein: 7.3 g/dL (ref 6.0–8.3)

## 2013-04-15 LAB — LIPID PANEL
CHOL/HDL RATIO: 3
Cholesterol: 174 mg/dL (ref 0–200)
HDL: 62.6 mg/dL (ref >39.00)
LDL CALC: 100 mg/dL — AB (ref 0–99)
TRIGLYCERIDES: 58 mg/dL (ref 0.0–149.0)
VLDL: 11.6 mg/dL (ref 0.0–40.0)

## 2013-04-15 LAB — TSH: TSH: 3.04 u[IU]/mL (ref 0.35–5.50)

## 2013-04-15 MED ORDER — CIPROFLOXACIN HCL 250 MG PO TABS: 250.0000 mg | ORAL_TABLET | Freq: Two times a day (BID) | ORAL | Status: DC

## 2013-04-15 NOTE — Assessment & Plan Note (Signed)
Presumed due to UTI - will send UCx given equivocal UA/micro. Will treat empirically with cipro 250mg  bid 3d course. Discussed small amt RBC/hpf seen today, slightly above normal.   Consider rpt UA at physical in 2 wks, advised update [place] if sxs persist or deteriorate in interim. Encouraged increased water/cranberry juice intake Will route note to PCP.

## 2013-04-15 NOTE — Patient Instructions (Signed)
I think this is a urine infection - treat with 3d course of antibiotic.  I will send off a urine culture as well. There was also a small amount of blood in the urine which can be seen with infection. Let Dr. Glori Bickers know if you're not feeling better by your physical.  Urinary Tract Infection Urinary tract infections (UTIs) can develop anywhere along your urinary tract. Your urinary tract is your body's drainage system for removing wastes and extra water. Your urinary tract includes two kidneys, two ureters, a bladder, and a urethra. Your kidneys are a pair of bean-shaped organs. Each kidney is about the size of your fist. They are located below your ribs, one on each side of your spine. CAUSES Infections are caused by microbes, which are microscopic organisms, including fungi, viruses, and bacteria. These organisms are so small that they can only be seen through a microscope. Bacteria are the microbes that most commonly cause UTIs. SYMPTOMS  Symptoms of UTIs may vary by age and gender of the patient and by the location of the infection. Symptoms in young women typically include a frequent and intense urge to urinate and a painful, burning feeling in the bladder or urethra during urination. Older women and men are more likely to be tired, shaky, and weak and have muscle aches and abdominal pain. A fever may mean the infection is in your kidneys. Other symptoms of a kidney infection include pain in your back or sides below the ribs, nausea, and vomiting. DIAGNOSIS To diagnose a UTI, your caregiver will ask you about your symptoms. Your caregiver also will ask to provide a urine sample. The urine sample will be tested for bacteria and white blood cells. White blood cells are made by your body to help fight infection. TREATMENT  Typically, UTIs can be treated with medication. Because most UTIs are caused by a bacterial infection, they usually can be treated with the use of antibiotics. The choice of  antibiotic and length of treatment depend on your symptoms and the type of bacteria causing your infection. HOME CARE INSTRUCTIONS  If you were prescribed antibiotics, take them exactly as your caregiver instructs you. Finish the medication even if you feel better after you have only taken some of the medication.  Drink enough water and fluids to keep your urine clear or pale yellow.  Avoid caffeine, tea, and carbonated beverages. They tend to irritate your bladder.  Empty your bladder often. Avoid holding urine for long periods of time.  Empty your bladder before and after sexual intercourse.  After a bowel movement, women should cleanse from front to back. Use each tissue only once. SEEK MEDICAL CARE IF:   You have back pain.  You develop a fever.  Your symptoms do not begin to resolve within 3 days. SEEK IMMEDIATE MEDICAL CARE IF:   You have severe back pain or lower abdominal pain.  You develop chills.  You have nausea or vomiting.  You have continued burning or discomfort with urination. MAKE SURE YOU:   Understand these instructions.  Will watch your condition.  Will get help right away if you are not doing well or get worse. Document Released: 10/13/2004 Document Revised: 07/05/2011 Document Reviewed: 02/11/2011 [facility] Patient Information 2014 [place].

## 2013-04-15 NOTE — Progress Notes (Signed)
Pre visit review using our clinic review tool, if applicable. No additional management support is needed unless otherwise documented below in the visit note. 

## 2013-04-15 NOTE — Progress Notes (Signed)
   BP 138/80  Pulse 100  Temp(Src) 97.8 F (36.6 C) (Oral)  Wt 147 lb 8 oz (66.906 kg)   CC: UTI?  Subjective:    Patient ID: Huey Romans, female    DOB: 18-May-1955, 58 y.o.   MRN: [id number]  HPI: MARAYAH HIGDON is a 58 y.o. female presenting on 04/15/2013 for strong odor to urine   Pleasant 58 yo presents today with concern for UTI with sxs of LBP and strong urine odor.  sxs started 2 wks ago.  Has self treated with increasing water intake.  Points to area below left flank as area of tenderness, worse with prolonged standing.  Denies recent exertion.  No fevers/chills, dysuria, urgency, frequency, hematuria.  abd pain, nausea, flank pain.  No recent abx use.  No recent UTI.  Doesn't think has ever had uti.  Relevant past medical, surgical, family and social history reviewed and updated as indicated.  Allergies and medications reviewed and updated. Current Outpatient Prescriptions on File Prior to Visit  Medication Sig  . cyclobenzaprine (FLEXERIL) 10 MG tablet Take 5 mg by mouth at bedtime as needed.    Marland Kitchen LORazepam (ATIVAN) 1 MG tablet Take 0.5 tablets (0.5 mg total) by mouth at bedtime as needed for anxiety.   No current facility-administered medications on file prior to visit.    Review of Systems Per HPI unless specifically indicated above    Objective:    BP 138/80  Pulse 100  Temp(Src) 97.8 F (36.6 C) (Oral)  Wt 147 lb 8 oz (66.906 kg)  Physical Exam  Nursing note and vitals reviewed. Constitutional: She appears well-developed and well-nourished. No distress.  Abdominal: Soft. Normal appearance and bowel sounds are normal. She exhibits no distension and no mass. There is no hepatosplenomegaly. There is no tenderness. There is no rigidity, no rebound, no guarding, no CVA tenderness and negative Murphy's sign.  Musculoskeletal:  No midline lumbar spine tenderness  Psychiatric: She has a normal mood and affect.   Results for orders placed in visit on 04/15/13    POCT URINALYSIS DIPSTICK      Result Value Ref Range   Color, UA yellow     Clarity, UA clear     Glucose, UA negative     Bilirubin, UA negative     Ketones, UA negative     Spec Grav, UA 1.015     Blood, UA moderate     pH, UA 6.0     Protein, UA negative     Urobilinogen, UA 0.2     Nitrite, UA negative     Leukocytes, UA small (1+)        Assessment & Plan:   Problem List Items Addressed This Visit   Microhematuria - Primary     Presumed due to UTI - will send UCx given equivocal UA/micro. Will treat empirically with cipro 250mg  bid 3d course. Discussed small amt RBC/hpf seen today, slightly above normal.   Consider rpt UA at physical in 2 wks, advised update [place] if sxs persist or deteriorate in interim. Encouraged increased water/cranberry juice intake Will route note to PCP.    Relevant Orders      Urine culture    Other Visit Diagnoses   Bad odor of urine        Relevant Orders       POCT urinalysis dipstick (Completed)        Follow up plan: Return if symptoms worsen or fail to improve.

## 2013-04-16 LAB — CBC WITH DIFFERENTIAL/PLATELET
BASOS PCT: 0.2 % (ref 0.0–3.0)
Basophils Absolute: 0 10*3/uL (ref 0.0–0.1)
Eosinophils Absolute: 0.1 10*3/uL (ref 0.0–0.7)
Eosinophils Relative: 1.1 % (ref 0.0–5.0)
HEMATOCRIT: 42.3 % (ref 36.0–46.0)
HEMOGLOBIN: 14.3 g/dL (ref 12.0–15.0)
Lymphocytes Relative: 23.2 % (ref 12.0–46.0)
Lymphs Abs: 1.1 10*3/uL (ref 0.7–4.0)
MCHC: 33.9 g/dL (ref 30.0–36.0)
MCV: 91.7 fl (ref 78.0–100.0)
MONOS PCT: 4.7 % (ref 3.0–12.0)
Monocytes Absolute: 0.2 10*3/uL (ref 0.1–1.0)
NEUTROS ABS: 3.5 10*3/uL (ref 1.4–7.7)
Neutrophils Relative %: 70.8 % (ref 43.0–77.0)
Platelets: 197 10*3/uL (ref 150.0–400.0)
RBC: 4.61 Mil/uL (ref 3.87–5.11)
RDW: 13.4 % (ref 11.5–14.6)
WBC: 4.9 10*3/uL (ref 4.5–10.5)

## 2013-04-16 LAB — VITAMIN D 25 HYDROXY (VIT D DEFICIENCY, FRACTURES): Vit D, 25-Hydroxy: 49 ng/mL (ref 30–89)

## 2013-04-17 LAB — URINE CULTURE
Colony Count: NO GROWTH
ORGANISM ID, BACTERIA: NO GROWTH

## 2013-04-22 ENCOUNTER — Encounter: Payer: Managed Care, Other (non HMO) | Admitting: Family Medicine

## 2013-05-01 ENCOUNTER — Encounter: Payer: Self-pay | Admitting: Family Medicine

## 2013-05-01 ENCOUNTER — Other Ambulatory Visit (HOSPITAL_COMMUNITY)
Admission: RE | Admit: 2013-05-01 | Discharge: 2013-05-01 | Disposition: A | Discharge status: Home / Self Care | Payer: Managed Care, Other (non HMO) | Source: Ambulatory Visit | Attending: Family Medicine | Admitting: Family Medicine

## 2013-05-01 ENCOUNTER — Ambulatory Visit (INDEPENDENT_AMBULATORY_CARE_PROVIDER_SITE_OTHER): Payer: Managed Care, Other (non HMO) | Admitting: Family Medicine

## 2013-05-01 VITALS — BP 128/74 | HR 89 | Temp 98.8°F | Ht 65.75 in | Wt 148.0 lb

## 2013-05-01 DIAGNOSIS — M949 Disorder of cartilage, unspecified: Secondary | ICD-10-CM

## 2013-05-01 DIAGNOSIS — Z87440 Personal history of urinary (tract) infections: Secondary | ICD-10-CM

## 2013-05-01 DIAGNOSIS — R3129 Other microscopic hematuria: Secondary | ICD-10-CM

## 2013-05-01 DIAGNOSIS — M899 Disorder of bone, unspecified: Secondary | ICD-10-CM

## 2013-05-01 DIAGNOSIS — Z1211 Encounter for screening for malignant neoplasm of colon: Secondary | ICD-10-CM

## 2013-05-01 DIAGNOSIS — Z01419 Encounter for gynecological examination (general) (routine) without abnormal findings: Secondary | ICD-10-CM

## 2013-05-01 DIAGNOSIS — N952 Postmenopausal atrophic vaginitis: Secondary | ICD-10-CM | POA: Insufficient documentation

## 2013-05-01 DIAGNOSIS — Z0000 Encounter for general adult medical examination without abnormal findings: Secondary | ICD-10-CM

## 2013-05-01 LAB — POCT URINALYSIS DIPSTICK
BILIRUBIN UA: NEGATIVE
Glucose, UA: NEGATIVE
KETONES UA: NEGATIVE
Nitrite, UA: NEGATIVE
Protein, UA: NEGATIVE
Spec Grav, UA: <=1.005
Urobilinogen, UA: 0.2
pH, UA: 7

## 2013-05-01 LAB — POCT UA - MICROSCOPIC ONLY
BACTERIA, U MICROSCOPIC: 0
Casts, Ur, LPF, POC: 0
Yeast, UA: 0

## 2013-05-01 MED ORDER — ESTRADIOL 0.1 MG/GM VA CREA: TOPICAL_CREAM | VAGINAL | Status: DC

## 2013-05-01 MED ORDER — LORAZEPAM 1 MG PO TABS: 0.5000 mg | ORAL_TABLET | Freq: Every evening | ORAL | Status: DC | PRN

## 2013-05-01 NOTE — Progress Notes (Signed)
Pre visit review using our clinic review tool, if applicable. No additional management support is needed unless otherwise documented below in the visit note. 

## 2013-05-01 NOTE — Assessment & Plan Note (Signed)
?   If this may have caused some micro hematuria Will tx with twice weekly estrace cream vaginal- very small amt Disc poss side eff and risks Will continue to follow Pt is not sexually active at this time

## 2013-05-01 NOTE — Patient Instructions (Signed)
Urine looks unchanged - your symptoms may be from atrophic vaginitis (dryness) Try the estrogen vaginal cream twice weekly and we will watch this  Do the stool card for colon cancer screening Don't forget to schedule your mammogram  Take care of yourself

## 2013-05-01 NOTE — Progress Notes (Signed)
Subjective:    Patient ID: Denise Grant, female    DOB: 04/19/1955, 58 y.o.   MRN: [id number]  HPI Here for health maintenance exam and to review chronic medical problems   Also f/u for presumed uti (neg cx tx with cipro)  Feeling ok overall  Saw DR G for presumed uti - and after cipro -urine does not smell as strong  Thinks her back pain was from bulging disc   Wt is stable with bmi of 24  Colon cancer screen-declines colonoscopy  Will do IFOB however  Mammogram 4/14-has not had hers yet  Self exam-no lumps or changes   Flu shot 4/14  Pap 11/12 nl- wants to do that today  No vaginal discharge or other symptoms (has her usual amt of discharge)-no pain or bleeding   Td 4/14  Results for orders placed in visit on 05/01/13  POCT URINALYSIS DIPSTICK      Result Value Ref Range   Color, UA yellow     Clarity, UA clear     Glucose, UA neg.     Bilirubin, UA neg.     Ketones, UA neg.     Spec Grav, UA <=1.005     Blood, UA moderate     pH, UA 7.0     Protein, UA neg.     Urobilinogen, UA 0.2     Nitrite, UA neg.     Leukocytes, UA small (1+)      Lab Results  Component Value Date   CHOL 174 04/15/2013   CHOL 162 04/13/2012   CHOL 160 11/18/2010   Lab Results  Component Value Date   HDL 62.60 04/15/2013   HDL 56.40 04/13/2012   HDL 57.50 11/18/2010   Lab Results  Component Value Date   LDLCALC 100* 04/15/2013   LDLCALC 96 04/13/2012   LDLCALC 93 11/18/2010   Lab Results  Component Value Date   TRIG 58.0 04/15/2013   TRIG 48.0 04/13/2012   TRIG 46.0 11/18/2010   Lab Results  Component Value Date   CHOLHDL 3 04/15/2013   CHOLHDL 3 04/13/2012   CHOLHDL 3 11/18/2010   No results found for this basename: LDLDIRECT   overall good cholesterol   Derm visit good in jan - 1 mole -no dysplasia - has fam hx of melanoma   D is 25  She is taking her D some of the time  Last dexa -5/14   Patient Active Problem List   Diagnosis Date Noted  . Atrophic vaginitis  05/01/2013  . Microhematuria 04/15/2013  . Colon cancer screening 04/20/2012  . Encounter for routine gynecological examination 11/24/2010  . Grief 11/24/2010  . Routine general medical examination at a health care facility 11/18/2010  . INSOMNIA 01/27/2010  . CONTACT DERMATITIS-NOS 03/31/2008  . HEADACHE 03/31/2008  . OSTEOPENIA 09/18/2007  . ANXIETY 05/05/2006  . MITRAL VALVE PROLAPSE 05/05/2006  . ALLERGIC RHINITIS 05/05/2006  . FIBROCYSTIC BREAST DISEASE 05/05/2006  . SEBORRHEIC DERMATITIS 05/05/2006  . KERATOSIS, ACTINIC 05/05/2006  . NECK PAIN, CHRONIC 05/05/2006   Past Medical History  Diagnosis Date  . Allergic rhinitis   . Anxiety   . Osteopenia   . DDD (degenerative disc disease)   . Lactose intolerance   . Vaginal cyst     stable  . Diffuse cystic mastopathy   . Headache(784.0)   . Insomnia, unspecified   . Actinic keratosis   . Family history of other specified malignant neoplasm   . Mitral valve disorders   .  Seborrheic dermatitis, unspecified   . Chiari malformation    Past Surgical History  Procedure Laterality Date  . Tubal ligation    . Lumbar disc surgery    . Chiari malformation surgery  6/03  . Excision morton's neuroma  2/04  . Bone spur removal  10/06    shoulder  . Cervical disc surgery  2005   History  Substance Use Topics  . Smoking status: Former Smoker    Quit date: 01/17/2001  . Smokeless tobacco: Never Used  . Alcohol Use: No   Family History  Problem Relation Age of Onset  . Melanoma Sister   . Lung cancer Father   . Coronary artery disease Mother   . Heart failure Mother   . Hypertension Mother   . Anxiety disorder Son   . Lung cancer Brother     + smoker   Allergies  Allergen Reactions  . Calcium     REACTION: cannot tolerate any calcium supplement - GI sympotms  . Fluticasone Propionate     REACTION: headache/irritation  . Paroxetine     REACTION: abdominal pain and constipation   Current Outpatient  Prescriptions on File Prior to Visit  Medication Sig Dispense Refill  . Chlorphen-Phenyleph-Ibuprofen (ADVIL ALLERGY & CONGESTION PO) Take by mouth as needed.      . cyclobenzaprine (FLEXERIL) 10 MG tablet Take 5 mg by mouth at bedtime as needed.         No current facility-administered medications on file prior to visit.    Review of Systems Review of Systems  Constitutional: Negative for fever, appetite change, fatigue and unexpected weight change.  Eyes: Negative for pain and visual disturbance.  Respiratory: Negative for cough and shortness of breath.   Cardiovascular: Negative for cp or palpitations    Gastrointestinal: Negative for nausea, diarrhea and constipation.  Genitourinary: Negative for urgency and frequency. neg for dysuria or gross hematuria pos for vaginal dryness  Skin: Negative for pallor or rash   Neurological: Negative for weakness, light-headedness, numbness and headaches.  Hematological: Negative for adenopathy. Does not bruise/bleed easily.  Psychiatric/Behavioral: Negative for dysphoric mood. The patient is not nervous/anxious.         Objective:   Physical Exam  Constitutional: She appears well-developed and well-nourished. No distress.  HENT:  Head: Normocephalic and atraumatic.  Right Ear: External ear normal.  Left Ear: External ear normal.  Nose: Nose normal.  Mouth/Throat: Oropharynx is clear and moist.  Eyes: Conjunctivae and EOM are normal. Pupils are equal, round, and reactive to light. Right eye exhibits no discharge. Left eye exhibits no discharge. No scleral icterus.  Neck: Normal range of motion. Neck supple. No JVD present. No thyromegaly present.  Cardiovascular: Normal rate, regular rhythm, normal heart sounds and intact distal pulses.  Exam reveals no gallop.   Pulmonary/Chest: Effort normal and breath sounds normal. No respiratory distress. She has no wheezes. She has no rales.  Abdominal: Soft. Bowel sounds are normal. She exhibits no  distension and no mass. There is no tenderness.  Genitourinary: No breast swelling, tenderness, discharge or bleeding. There is no rash, tenderness or lesion on the right labia. There is no rash, tenderness or lesion on the left labia. Uterus is not enlarged and not tender. Cervix exhibits no motion tenderness, no discharge and no friability. Right adnexum displays no mass, no tenderness and no fullness. Left adnexum displays no mass, no tenderness and no fullness. No tenderness around the vagina. No vaginal discharge found.  Large anterior  soft vaginal cyst noted that is stable from prior exams   Vag mucosa is very dry and slightly friable  Breast exam: No mass, nodules, thickening, tenderness, bulging, retraction, inflamation, nipple discharge or skin changes noted.  No axillary or clavicular LA.       Musculoskeletal: She exhibits no edema and no tenderness.  Lymphadenopathy:    She has no cervical adenopathy.  Neurological: She is alert. She has normal reflexes. No cranial nerve deficit. She exhibits normal muscle tone. Coordination normal.  Skin: Skin is warm and dry. No rash noted. No erythema. No pallor.  Psychiatric: She has a normal mood and affect.          Assessment & Plan:

## 2013-05-01 NOTE — Assessment & Plan Note (Signed)
Routine exam with pap Vaginal cyst is stable  Atrophic vaginitis noted as well as small urethral caruncle that is asymptomatic

## 2013-05-01 NOTE — Assessment & Plan Note (Signed)
Reviewed health habits including diet and exercise and skin cancer prevention Reviewed appropriate screening tests for age  Also reviewed health mt list, fam hx and immunization status , as well as social and family history   Labs reviewed  

## 2013-05-01 NOTE — Assessment & Plan Note (Signed)
D/w patient [id number] for colon cancer screening, including IFOB vs. colonoscopy.  Risks and benefits of both were discussed and patient voiced understanding.  Pt elects [id number] card  This was given

## 2013-05-01 NOTE — Assessment & Plan Note (Signed)
dexa is up to date  Disc imp of ca and D No fragility fx Disc need for calcium/ vitamin D/ wt bearing exercise and bone density test every 2 y to monitor Disc safety/ fracture risk in detail

## 2013-05-01 NOTE — Assessment & Plan Note (Signed)
Rev ua/ micro and prev cx Suspect this may be due to urethral caruncle or more likely vaginal atrophy Will tx with twice weekly estrace vaginal cream and continue to obs Pt will f/u if any urinary symptoms

## 2013-05-03 ENCOUNTER — Encounter: Payer: Self-pay | Admitting: *Deleted

## 2013-05-07 ENCOUNTER — Other Ambulatory Visit (INDEPENDENT_AMBULATORY_CARE_PROVIDER_SITE_OTHER): Payer: Managed Care, Other (non HMO)

## 2013-05-07 DIAGNOSIS — Z1211 Encounter for screening for malignant neoplasm of colon: Secondary | ICD-10-CM

## 2013-05-07 LAB — FECAL OCCULT BLOOD, IMMUNOCHEMICAL: Fecal Occult Bld: NEGATIVE

## 2013-05-10 ENCOUNTER — Encounter: Payer: Self-pay | Admitting: *Deleted

## 2013-05-21 ENCOUNTER — Ambulatory Visit: Payer: Self-pay | Admitting: Family Medicine

## 2013-05-21 LAB — HM MAMMOGRAPHY: HM Mammogram: NORMAL

## 2013-05-21 IMAGING — MG MM DIGITAL SCREENING BILAT W/ CAD
1 series · 4 of 4 positions shown · non-contrast
Comparison: Previous exam(s).

CLINICAL DATA: Screening.

EXAM:
DIGITAL SCREENING BILATERAL MAMMOGRAM WITH CAD

[R CC · right · 4 of 4 slices shown]
[im 1/4]
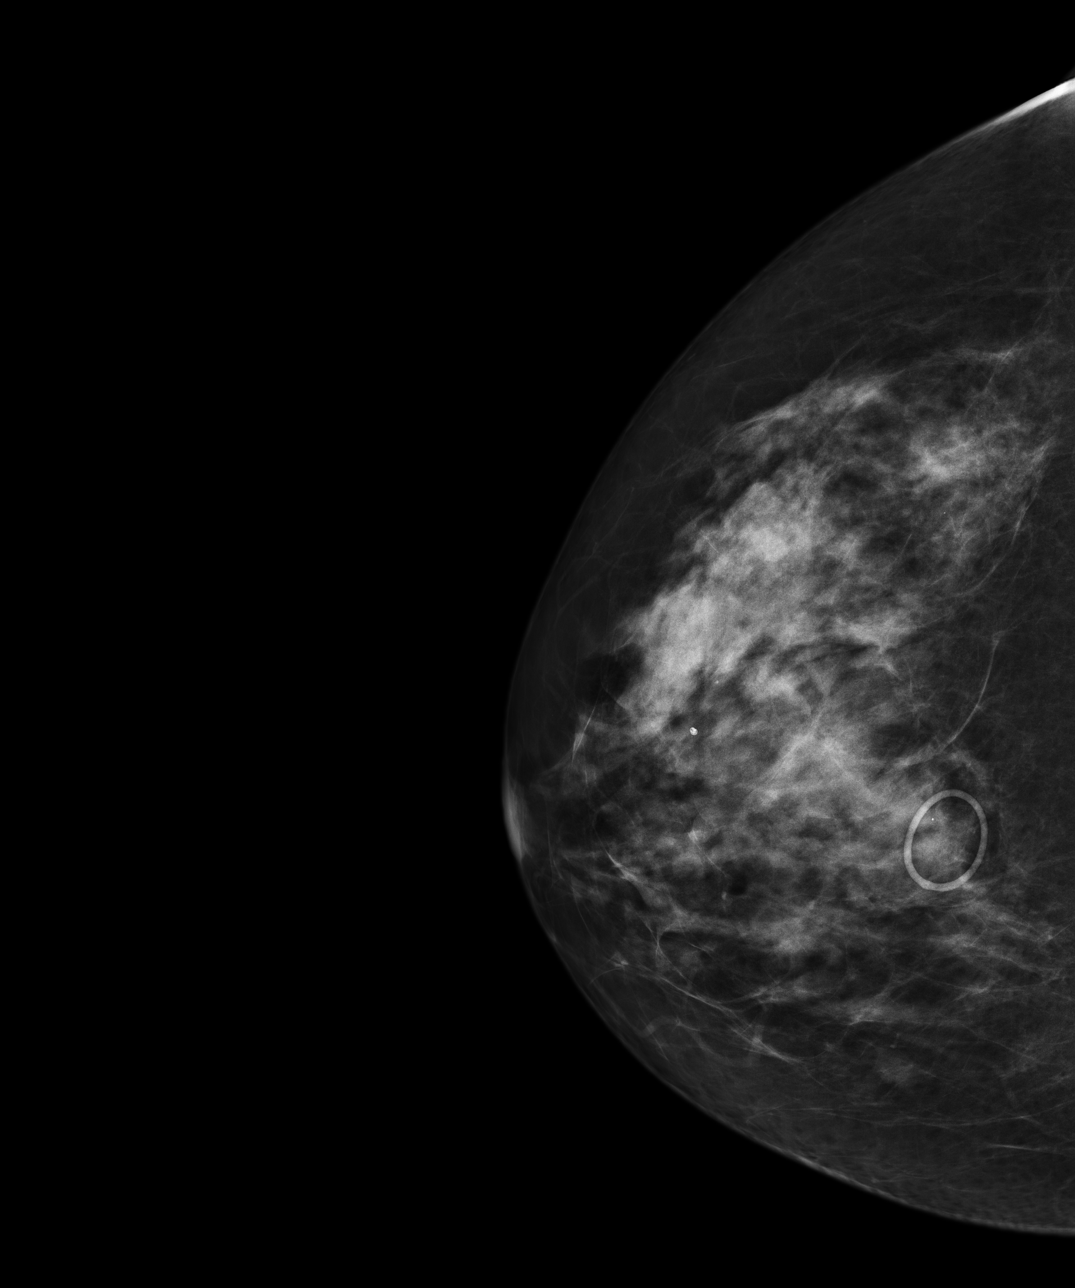
[im 2/4]
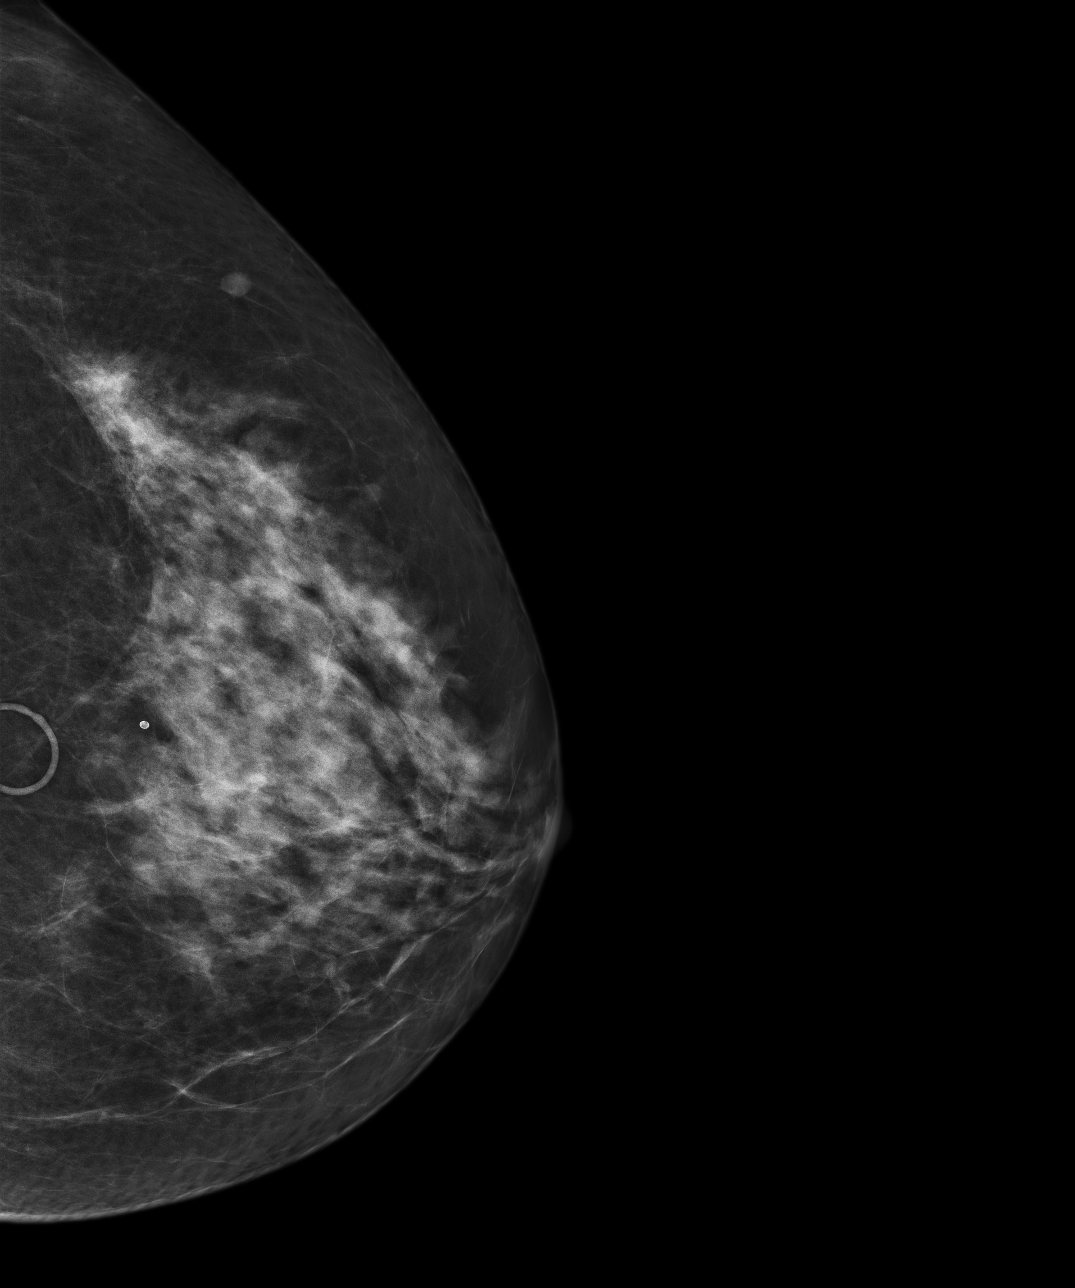
[im 3/4]
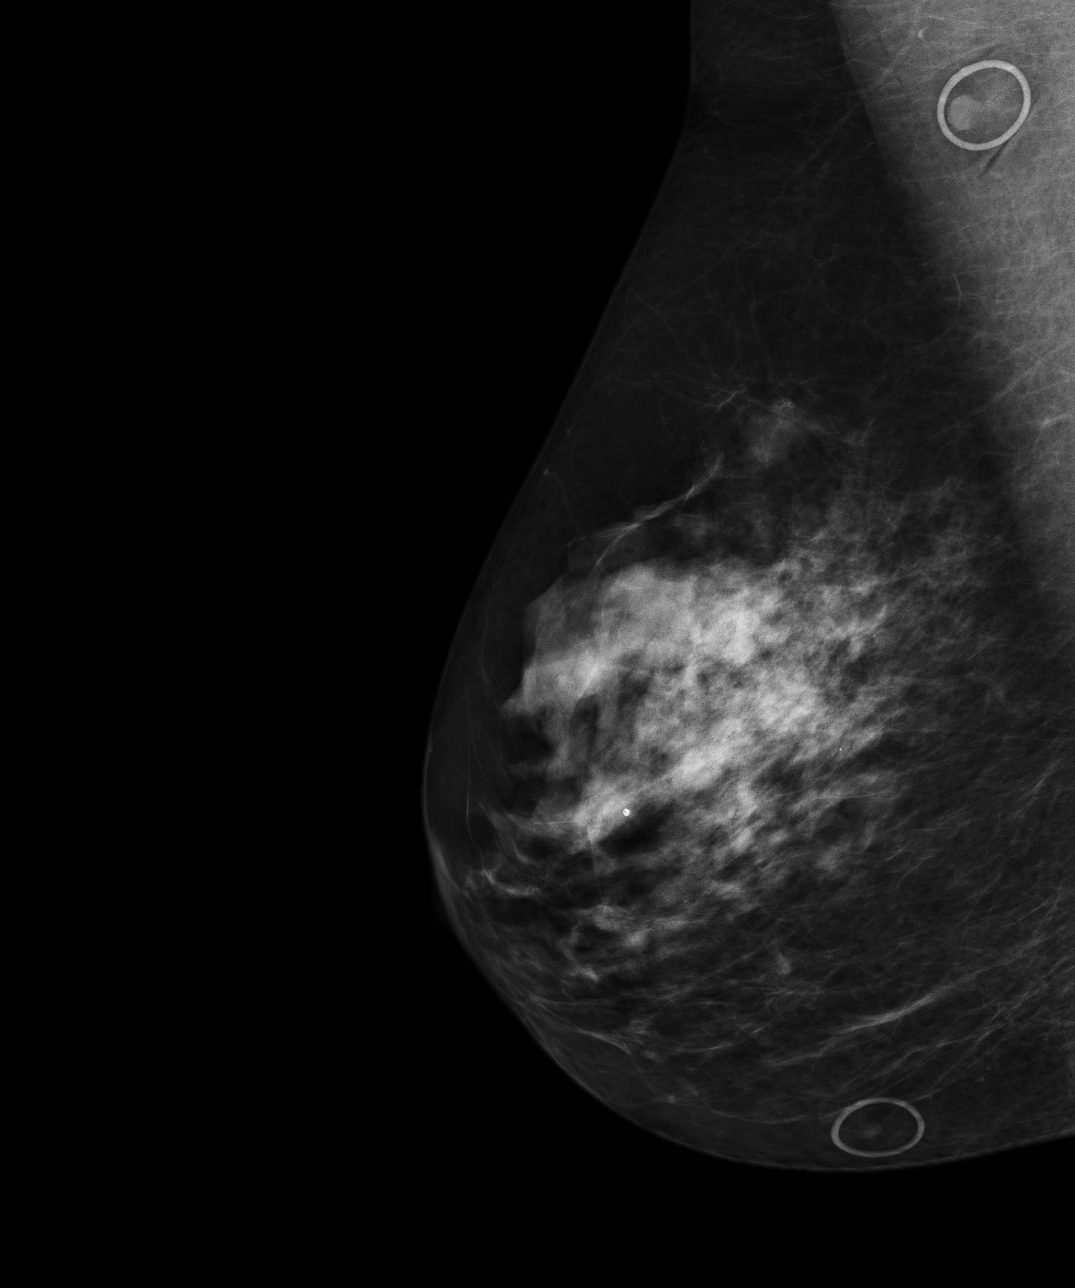
[im 4/4]
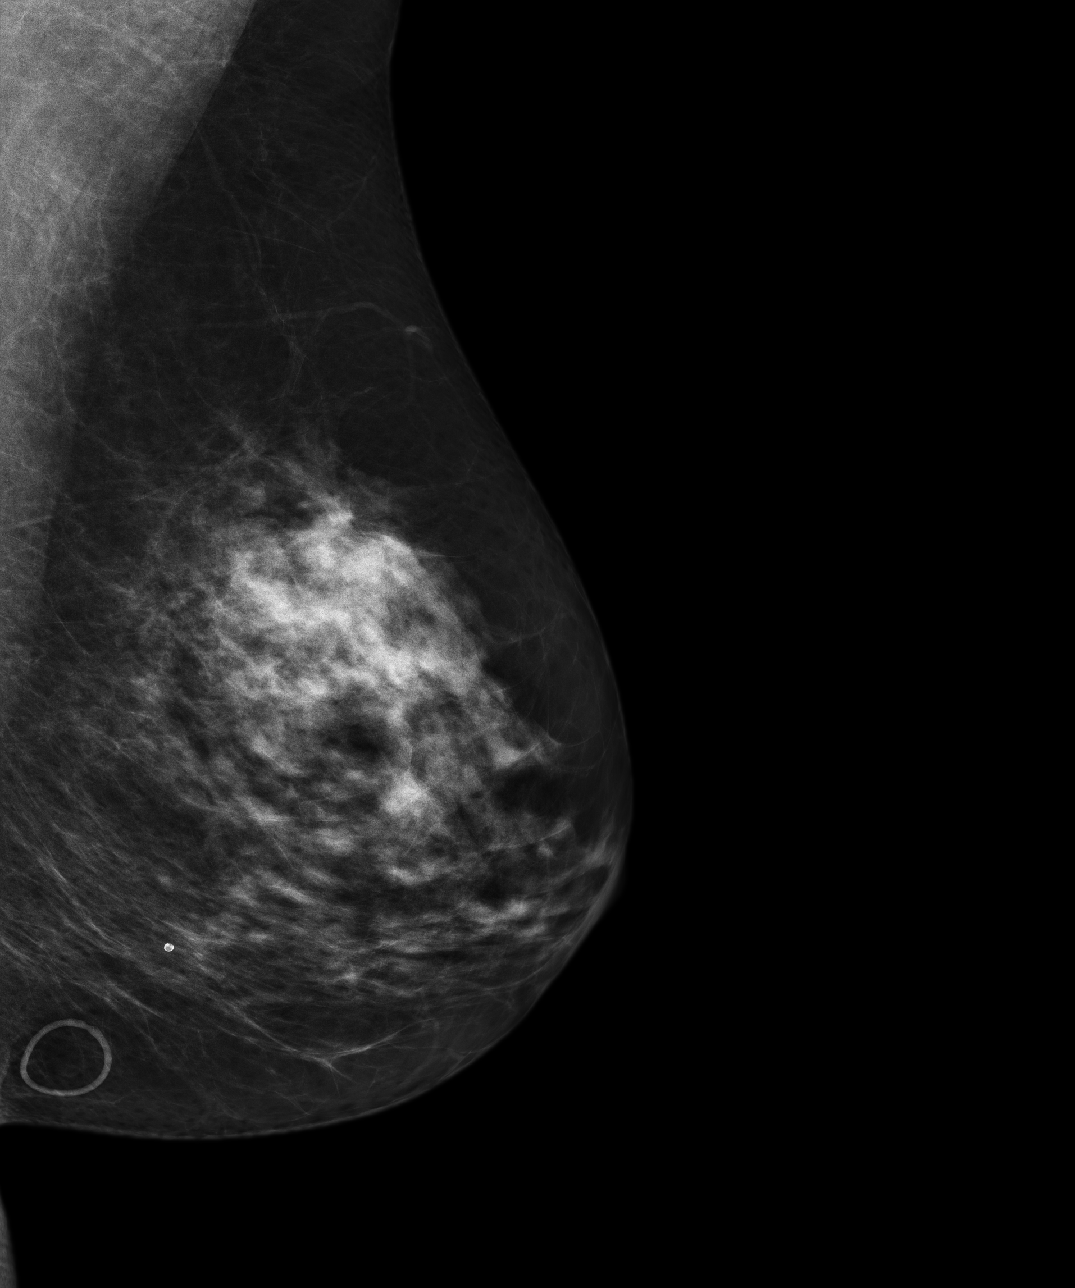

[4 of 4 positions shown; findings below may reference images not displayed]

ACR Breast Density Category c: The breast tissue is heterogeneously
dense, which may obscure small masses.
FINDINGS: There are no findings suspicious for malignancy. Images were
processed with CAD.
IMPRESSION: No mammographic evidence of malignancy. A result letter of this
screening mammogram will be mailed directly to the patient.

RECOMMENDATION:
Screening mammogram in one year. (Code:[0J])

BI-RADS CATEGORY  1: Negative.

## 2013-05-22 ENCOUNTER — Encounter: Payer: Self-pay | Admitting: Family Medicine

## 2013-05-23 ENCOUNTER — Encounter: Payer: Self-pay | Admitting: *Deleted

## 2013-05-23 ENCOUNTER — Encounter: Payer: Self-pay | Admitting: Family Medicine

## 2013-10-02 ENCOUNTER — Encounter (INDEPENDENT_AMBULATORY_CARE_PROVIDER_SITE_OTHER): Payer: Self-pay

## 2013-10-02 ENCOUNTER — Encounter: Payer: Self-pay | Admitting: Family Medicine

## 2013-10-02 ENCOUNTER — Ambulatory Visit (INDEPENDENT_AMBULATORY_CARE_PROVIDER_SITE_OTHER): Payer: Managed Care, Other (non HMO) | Admitting: Family Medicine

## 2013-10-02 VITALS — BP 146/88 | HR 77 | Temp 98.2°F | Ht 65.75 in | Wt 150.2 lb

## 2013-10-02 DIAGNOSIS — M62830 Muscle spasm of back: Secondary | ICD-10-CM | POA: Insufficient documentation

## 2013-10-02 DIAGNOSIS — M5380 Other specified dorsopathies, site unspecified: Secondary | ICD-10-CM

## 2013-10-02 MED ORDER — CYCLOBENZAPRINE HCL 10 MG PO TABS: 5.0000 mg | ORAL_TABLET | Freq: Three times a day (TID) | ORAL | Status: DC | PRN

## 2013-10-02 NOTE — Patient Instructions (Signed)
You have a muscle spasm in back and neck  Gently stretch and keep moving  Use heat for 10 minutes at a time  Massage may help  Use a cervical support pillow  Ibuprofen is ok -take with food  Flexeril - muscle relaxer-watch out for sedation  Update if not starting to improve in a week or if worsening

## 2013-10-02 NOTE — Progress Notes (Signed)
Subjective:    Patient ID: Denise Grant, female    DOB: 23-Oct-1955, 58 y.o.   MRN: [id number]  HPI Has a pain in the L side of upper back and neck  Happened this am when she was reaching up with L arm to get something  Is sharp and dull and pulling  Worst to rotate her head to the R   Took some ibuprofen 400 mg this   Used ice and then heat   Patient Active Problem List   Diagnosis Date Noted  . Atrophic vaginitis 05/01/2013  . Microhematuria 04/15/2013  . Colon cancer screening 04/20/2012  . Encounter for routine gynecological examination 11/24/2010  . Grief 11/24/2010  . Routine general medical examination at a health care facility 11/18/2010  . INSOMNIA 01/27/2010  . CONTACT DERMATITIS-NOS 03/31/2008  . HEADACHE 03/31/2008  . OSTEOPENIA 09/18/2007  . ANXIETY 05/05/2006  . MITRAL VALVE PROLAPSE 05/05/2006  . ALLERGIC RHINITIS 05/05/2006  . FIBROCYSTIC BREAST DISEASE 05/05/2006  . SEBORRHEIC DERMATITIS 05/05/2006  . KERATOSIS, ACTINIC 05/05/2006  . NECK PAIN, CHRONIC 05/05/2006   Past Medical History  Diagnosis Date  . Allergic rhinitis   . Anxiety   . Osteopenia   . DDD (degenerative disc disease)   . Lactose intolerance   . Vaginal cyst     stable  . Diffuse cystic mastopathy   . Headache(784.0)   . Insomnia, unspecified   . Actinic keratosis   . Family history of other specified malignant neoplasm   . Mitral valve disorders   . Seborrheic dermatitis, unspecified   . Chiari malformation    Past Surgical History  Procedure Laterality Date  . Tubal ligation    . Lumbar disc surgery    . Chiari malformation surgery  6/03  . Excision morton's neuroma  2/04  . Bone spur removal  10/06    shoulder  . Cervical disc surgery  2005   History  Substance Use Topics  . Smoking status: Former Smoker    Quit date: 01/17/2001  . Smokeless tobacco: Never Used  . Alcohol Use: No   Family History  Problem Relation Age of Onset  . Melanoma Sister   . Lung  cancer Father   . Coronary artery disease Mother   . Heart failure Mother   . Hypertension Mother   . Anxiety disorder Son   . Lung cancer Brother     + smoker   Allergies  Allergen Reactions  . Calcium     REACTION: cannot tolerate any calcium supplement - GI sympotms  . Fluticasone Propionate     REACTION: headache/irritation  . Paroxetine     REACTION: abdominal pain and constipation   Current Outpatient Prescriptions on File Prior to Visit  Medication Sig Dispense Refill  . Chlorphen-Phenyleph-Ibuprofen (ADVIL ALLERGY & CONGESTION PO) Take by mouth as needed.      Marland Kitchen estradiol (ESTRACE VAGINAL) 0.1 MG/GM vaginal cream Use 1/8 applicatorful vaginally twice a week  42.5 g  3  . LORazepam (ATIVAN) 1 MG tablet Take 0.5 tablets (0.5 mg total) by mouth at bedtime as needed for anxiety.  30 tablet  1   No current facility-administered medications on file prior to visit.      Review of Systems Review of Systems  Constitutional: Negative for fever, appetite change, fatigue and unexpected weight change.  Eyes: Negative for pain and visual disturbance.  Respiratory: Negative for cough and shortness of breath.   Cardiovascular: Negative for cp or palpitations  Gastrointestinal: Negative for nausea, diarrhea and constipation.  Genitourinary: Negative for urgency and frequency.  Skin: Negative for pallor or rash   MSK pos for back pain rad to neck  Neurological: Negative for weakness, light-headedness, numbness and headaches.  Hematological: Negative for adenopathy. Does not bruise/bleed easily.  Psychiatric/Behavioral: Negative for dysphoric mood. The patient is not nervous/anxious.         Objective:   Physical Exam  Constitutional: She appears well-developed and well-nourished. No distress.  HENT:  Head: Normocephalic and atraumatic.  Mouth/Throat: Oropharynx is clear and moist.  Eyes: Conjunctivae and EOM are normal. Pupils are equal, round, and reactive to light.  Neck:  Normal range of motion. Neck supple.  Cardiovascular: Normal rate, regular rhythm and normal heart sounds.   Pulmonary/Chest: Effort normal and breath sounds normal. No respiratory distress. She has no wheezes. She has no rales.  Musculoskeletal: She exhibits tenderness. She exhibits no edema.  Tender medial to L scapula and also in trapezius area  Nl rom neck with pain to fully flex and rot to the R  No bony tenderness  Nl rom L shoulder with neg hawking and neer tests as well   No neuro symptoms   Lymphadenopathy:    She has no cervical adenopathy.  Neurological: She is alert. She has normal reflexes. She displays no atrophy and no tremor. No cranial nerve deficit. She exhibits normal muscle tone. Coordination and gait normal.  Skin: Skin is warm and dry. No rash noted. No erythema.  Psychiatric: She has a normal mood and affect.          Assessment & Plan:   Problem List Items Addressed This Visit     Other   Spasm of back muscles - Primary     In rhomboid/trapezius area on the L after reaching upwards  tx with ibuprofen and flexeril (caution of sedation) Heat/massage/gentle rom stretching  Alert if neurol symptoms Update if not starting to improve in a week or if worsening

## 2013-10-02 NOTE — Assessment & Plan Note (Signed)
In rhomboid/trapezius area on the L after reaching upwards  tx with ibuprofen and flexeril (caution of sedation) Heat/massage/gentle rom stretching  Alert if neurol symptoms Update if not starting to improve in a week or if worsening

## 2013-10-02 NOTE — Progress Notes (Signed)
Pre visit review using our clinic review tool, if applicable. No additional management support is needed unless otherwise documented below in the visit note. 

## 2014-01-17 HISTORY — PX: CATARACT EXTRACTION, BILATERAL: SHX1313

## 2014-03-25 ENCOUNTER — Other Ambulatory Visit: Payer: Self-pay | Admitting: Family Medicine

## 2014-03-25 NOTE — Telephone Encounter (Signed)
Ativan refill request.  Last seen 10/02/2013.  Last filled 05/01/2013.  Please advise.

## 2014-03-25 NOTE — Telephone Encounter (Signed)
Px written for call in   

## 2014-03-25 NOTE — Telephone Encounter (Signed)
Ativan called into [facility]. 

## 2014-05-21 ENCOUNTER — Encounter: Payer: Self-pay | Admitting: Family Medicine

## 2014-05-21 ENCOUNTER — Ambulatory Visit (INDEPENDENT_AMBULATORY_CARE_PROVIDER_SITE_OTHER): Payer: Managed Care, Other (non HMO) | Admitting: Family Medicine

## 2014-05-21 VITALS — BP 122/84 | HR 82 | Temp 98.4°F | Ht 65.75 in | Wt 146.5 lb

## 2014-05-21 DIAGNOSIS — G43109 Migraine with aura, not intractable, without status migrainosus: Secondary | ICD-10-CM | POA: Diagnosis not present

## 2014-05-21 DIAGNOSIS — J0100 Acute maxillary sinusitis, unspecified: Secondary | ICD-10-CM

## 2014-05-21 DIAGNOSIS — Z8669 Personal history of other diseases of the nervous system and sense organs: Secondary | ICD-10-CM | POA: Diagnosis not present

## 2014-05-21 DIAGNOSIS — J019 Acute sinusitis, unspecified: Secondary | ICD-10-CM | POA: Insufficient documentation

## 2014-05-21 MED ORDER — AMOXICILLIN-POT CLAVULANATE 875-125 MG PO TABS: 1.0000 | ORAL_TABLET | Freq: Two times a day (BID) | ORAL | Status: DC

## 2014-05-21 MED ORDER — MECLIZINE HCL 25 MG PO TABS: 25.0000 mg | ORAL_TABLET | Freq: Three times a day (TID) | ORAL | Status: DC | PRN

## 2014-05-21 NOTE — Patient Instructions (Signed)
Drink lots of fluids Take augmentin for sinus infection  If needed- meclizine for dizziness and watch out for sedation   Update if not starting to improve in a week or if worsening

## 2014-05-21 NOTE — Progress Notes (Signed)
Pre visit review using our clinic review tool, if applicable. No additional management support is needed unless otherwise documented below in the visit note. 

## 2014-05-21 NOTE — Progress Notes (Signed)
Subjective:    Patient ID: Denise Grant, female    DOB: [birth year], 59 y.o.   MRN: [id number]  HPI Here with HA and nausea   (not her typical headache)   Left work yesterday feeling bad Then had a headache-pretty bad  Got nauseated and then dizzy (room spinning)  Vomited twice  A little bit of diarrhea  Also sleepy   Today-the dizziness got better  More floaters today   Headache is improved Head is sore at the temples  Some sinus pain  Some bloody nasal d/c   She took advil sinus (1/2 pill) , then 1/2 lorazepam (did get nervous-poss from the decongestant)   ? What brought this on  Stress level goes up and down - more anxiety lately -has needed more ,   Still takes infrequently (less than once a week)  Neck was sore this am - better this am  Does not have flexeril left (for neck spasm)    Has hx of chiari malformation  Was told 11 years ago - she would not need f/u - Dr Sherwood Gambler   Patient Active Problem List   Diagnosis Date Noted  . Migraine with vertigo 05/21/2014  . Sinusitis, acute 05/21/2014  . History of Chiari malformation 05/21/2014  . Spasm of back muscles 10/02/2013  . Atrophic vaginitis 05/01/2013  . Microhematuria 04/15/2013  . Colon cancer screening 04/20/2012  . Encounter for routine gynecological examination 11/24/2010  . Grief 11/24/2010  . Routine general medical examination at a health care facility 11/18/2010  . INSOMNIA 01/27/2010  . CONTACT DERMATITIS-NOS 03/31/2008  . HEADACHE 03/31/2008  . OSTEOPENIA 09/18/2007  . ANXIETY 05/05/2006  . MITRAL VALVE PROLAPSE 05/05/2006  . ALLERGIC RHINITIS 05/05/2006  . FIBROCYSTIC BREAST DISEASE 05/05/2006  . SEBORRHEIC DERMATITIS 05/05/2006  . KERATOSIS, ACTINIC 05/05/2006  . NECK PAIN, CHRONIC 05/05/2006   Past Medical History  Diagnosis Date  . Allergic rhinitis   . Anxiety   . Osteopenia   . DDD (degenerative disc disease)   . Lactose intolerance   . Vaginal cyst     stable  . Diffuse  cystic mastopathy   . Headache(784.0)   . Insomnia, unspecified   . Actinic keratosis   . Family history of other specified malignant neoplasm   . Mitral valve disorders   . Seborrheic dermatitis, unspecified   . Chiari malformation    Past Surgical History  Procedure Laterality Date  . Tubal ligation    . Lumbar disc surgery    . Chiari malformation surgery  6/03  . Excision morton's neuroma  2/04  . Bone spur removal  10/06    shoulder  . Cervical disc surgery  2005   History  Substance Use Topics  . Smoking status: Former Smoker    Quit date: 01/17/2001  . Smokeless tobacco: Never Used  . Alcohol Use: No   Family History  Problem Relation Age of Onset  . Melanoma Sister   . Lung cancer Father   . Coronary artery disease Mother   . Heart failure Mother   . Hypertension Mother   . Anxiety disorder Son   . Lung cancer Brother     + smoker   Allergies  Allergen Reactions  . Calcium     REACTION: cannot tolerate any calcium supplement - GI sympotms  . Fluticasone Propionate     REACTION: headache/irritation  . Paroxetine     REACTION: abdominal pain and constipation   Current Outpatient Prescriptions on File Prior  to Visit  Medication Sig Dispense Refill  . Chlorphen-Phenyleph-Ibuprofen (ADVIL ALLERGY & CONGESTION PO) Take by mouth as needed.    . cyclobenzaprine (FLEXERIL) 10 MG tablet Take 0.5-1 tablets (5-10 mg total) by mouth 3 (three) times daily as needed for muscle spasms. 30 tablet 1  . LORazepam (ATIVAN) 1 MG tablet TAKE ONE-HALF TABLET BY MOUTH AT BEDTIME AS NEEDED FOR ANXIETY 30 tablet 1   No current facility-administered medications on file prior to visit.      Review of Systems Review of Systems  Constitutional: Negative for fever, appetite change, fatigue and unexpected weight change.  Eyes: Negative for pain and visual disturbance.  ENT pos for congestion and sinus pressure/pain  Respiratory: Negative for cough and shortness of breath.     Cardiovascular: Negative for cp or palpitations    Gastrointestinal: Negative for nausea, diarrhea and constipation.  Genitourinary: Negative for urgency and frequency.  Skin: Negative for pallor or rash   Neurological: Negative for weakness, light-, numbness and pos for headaches.  Hematological: Negative for adenopathy. Does not bruise/bleed easily.  Psychiatric/Behavioral: Negative for dysphoric mood. The patient is not nervous/anxious.         Objective:   Physical Exam  Constitutional: She is oriented to person, place, and time. She appears well-developed and well-nourished. No distress.  HENT:  Head: Normocephalic and atraumatic.  Right Ear: External ear normal.  Left Ear: External ear normal.  Mouth/Throat: Oropharynx is clear and moist. No oropharyngeal exudate.  Bilateral maxillary and frontal sinus tenderness Nares are injected and congested   No temporal tenderness  No TMJ tenderness  Eyes: Conjunctivae and EOM are normal. Pupils are equal, round, and reactive to light. Right eye exhibits no discharge. Left eye exhibits no discharge. No scleral icterus.  No nystagmus  Neck: Normal range of motion and full passive range of motion without pain. Neck supple. No JVD present. Carotid bruit is not present. No tracheal deviation present. No thyromegaly present.  Cardiovascular: Normal rate, regular rhythm and normal heart sounds.   No murmur heard. Pulmonary/Chest: Effort normal and breath sounds normal. No respiratory distress. She has no wheezes. She has no rales.  Abdominal: Soft. Bowel sounds are normal. She exhibits no distension and no mass. There is no tenderness.  Musculoskeletal: She exhibits no edema or tenderness.  Lymphadenopathy:    She has no cervical adenopathy.  Neurological: She is alert and oriented to person, place, and time. She has normal strength and normal reflexes. She displays no atrophy and no tremor. No cranial nerve deficit or sensory deficit. She  exhibits normal muscle tone. She displays a negative Romberg sign. Coordination and gait normal.  No focal cerebellar signs   Skin: Skin is warm and dry. No rash noted. No pallor.  Psychiatric: She has a normal mood and affect. Her behavior is normal. Thought content normal.          Assessment & Plan:   Problem List Items Addressed This Visit      Cardiovascular and Mediastinum   Migraine with vertigo - Primary    Suspect triggered by sinusitis Re assuring exam  If no imp after tx will consider imaging/MRI in light of hx of chiari malformation         Respiratory   Sinusitis, acute    Poss triggering vertiginous migraine Cover with augmentin Disc symptomatic care - see instructions on AVS  Update if not starting to improve in a week or if worsening  Relevant Medications   amoxicillin-clavulanate (AUGMENTIN) 875-125 MG per tablet     Other   History of Chiari malformation    If no improvement in HA -disc getting MRI  Re assuring exam

## 2014-05-22 ENCOUNTER — Telehealth: Payer: Self-pay | Admitting: Family Medicine

## 2014-05-22 ENCOUNTER — Other Ambulatory Visit: Payer: Self-pay

## 2014-05-22 DIAGNOSIS — Z1231 Encounter for screening mammogram for malignant neoplasm of breast: Secondary | ICD-10-CM

## 2014-05-22 NOTE — Assessment & Plan Note (Signed)
Poss triggering vertiginous migraine Cover with augmentin Disc symptomatic care - see instructions on AVS  Update if not starting to improve in a week or if worsening

## 2014-05-22 NOTE — Telephone Encounter (Signed)
Pt advise Dr. Glori Bickers out of office on Thursday and I will call tomorrow once note is ready

## 2014-05-22 NOTE — Telephone Encounter (Signed)
Pt states she was in for an OV yesterday and forgot to ask for a return to work note.  She states she plans to return on Monday, 05/26/2014.  Could you please prepare a note for her?  Thank you.

## 2014-05-22 NOTE — Telephone Encounter (Signed)
See prev note, is it okay for me to write a note taking her out of work until Monday? Please advise

## 2014-05-22 NOTE — Telephone Encounter (Signed)
I can't do it until tomorrow since I'm out of the office - send this back to me to remind me to do it in the am

## 2014-05-22 NOTE — Assessment & Plan Note (Signed)
Suspect triggered by sinusitis Re assuring exam  If no imp after tx will consider imaging/MRI in light of hx of chiari malformation

## 2014-05-22 NOTE — Assessment & Plan Note (Signed)
If no improvement in HA -disc getting MRI  Re assuring exam

## 2014-05-23 NOTE — Telephone Encounter (Signed)
Left voicemail letting pt know she can pick-up work note

## 2014-05-23 NOTE — Telephone Encounter (Signed)
Note printed and in IN box

## 2014-06-10 ENCOUNTER — Ambulatory Visit
Admission: RE | Admit: 2014-06-10 | Discharge: 2014-06-10 | Disposition: A | Discharge status: Home / Self Care | Payer: Managed Care, Other (non HMO) | Source: Ambulatory Visit | Attending: Family Medicine | Admitting: Family Medicine

## 2014-06-10 DIAGNOSIS — Z1231 Encounter for screening mammogram for malignant neoplasm of breast: Secondary | ICD-10-CM | POA: Diagnosis present

## 2014-06-10 LAB — HM MAMMOGRAPHY: HM Mammogram: NORMAL

## 2014-06-10 IMAGING — MG MM DIGITAL SCREENING BILAT W/ CAD
4 series · 4 of 4 positions shown · non-contrast
Comparison: Previous exam(s).

CLINICAL DATA: Screening.

EXAM:
DIGITAL SCREENING BILATERAL MAMMOGRAM WITH CAD

[R CC]
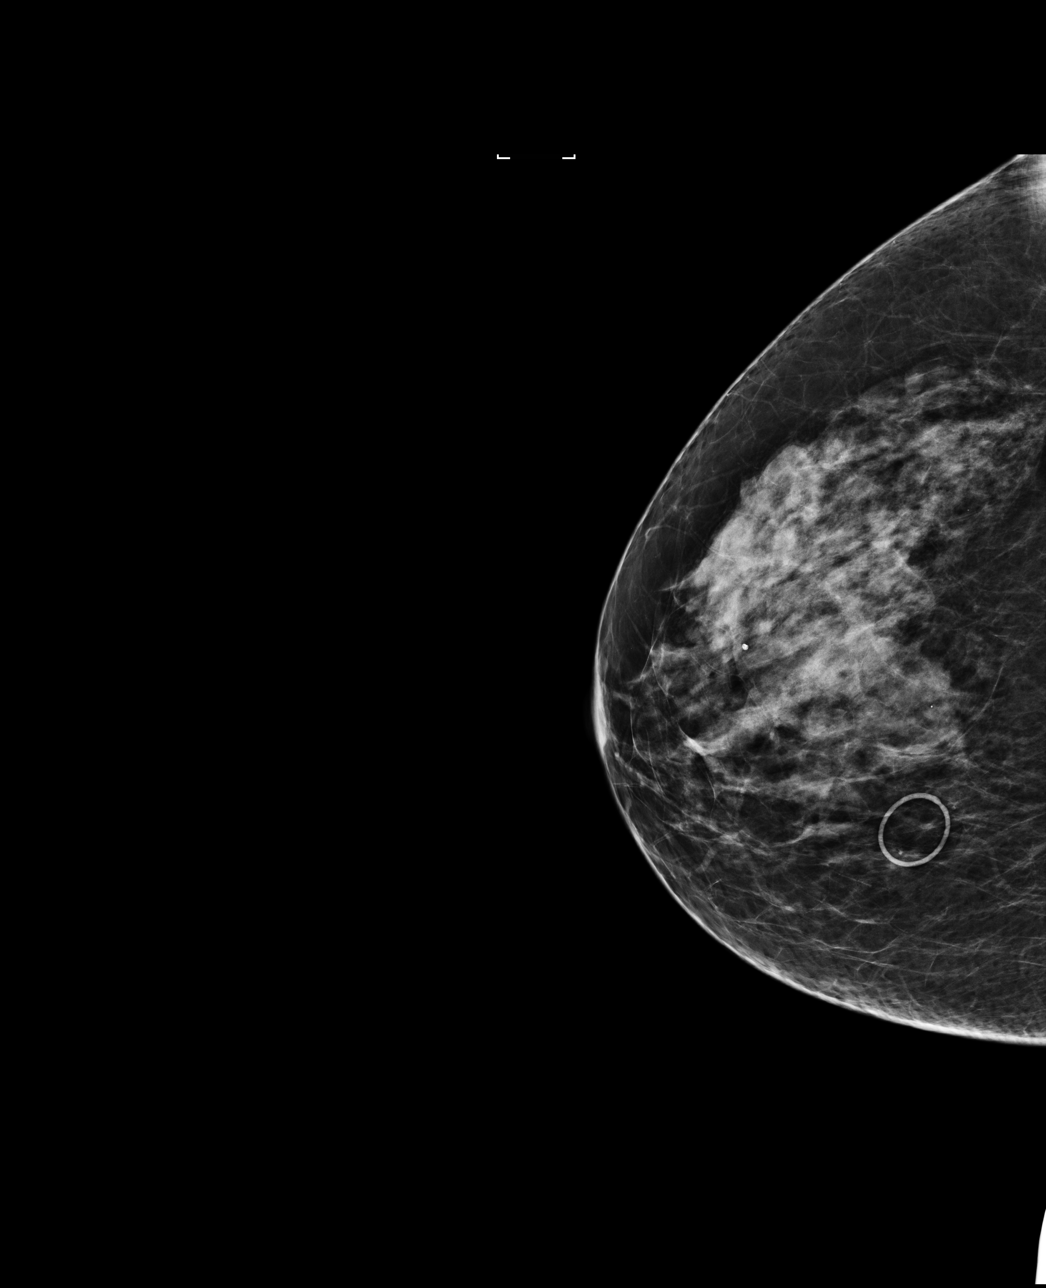

[L MLO]
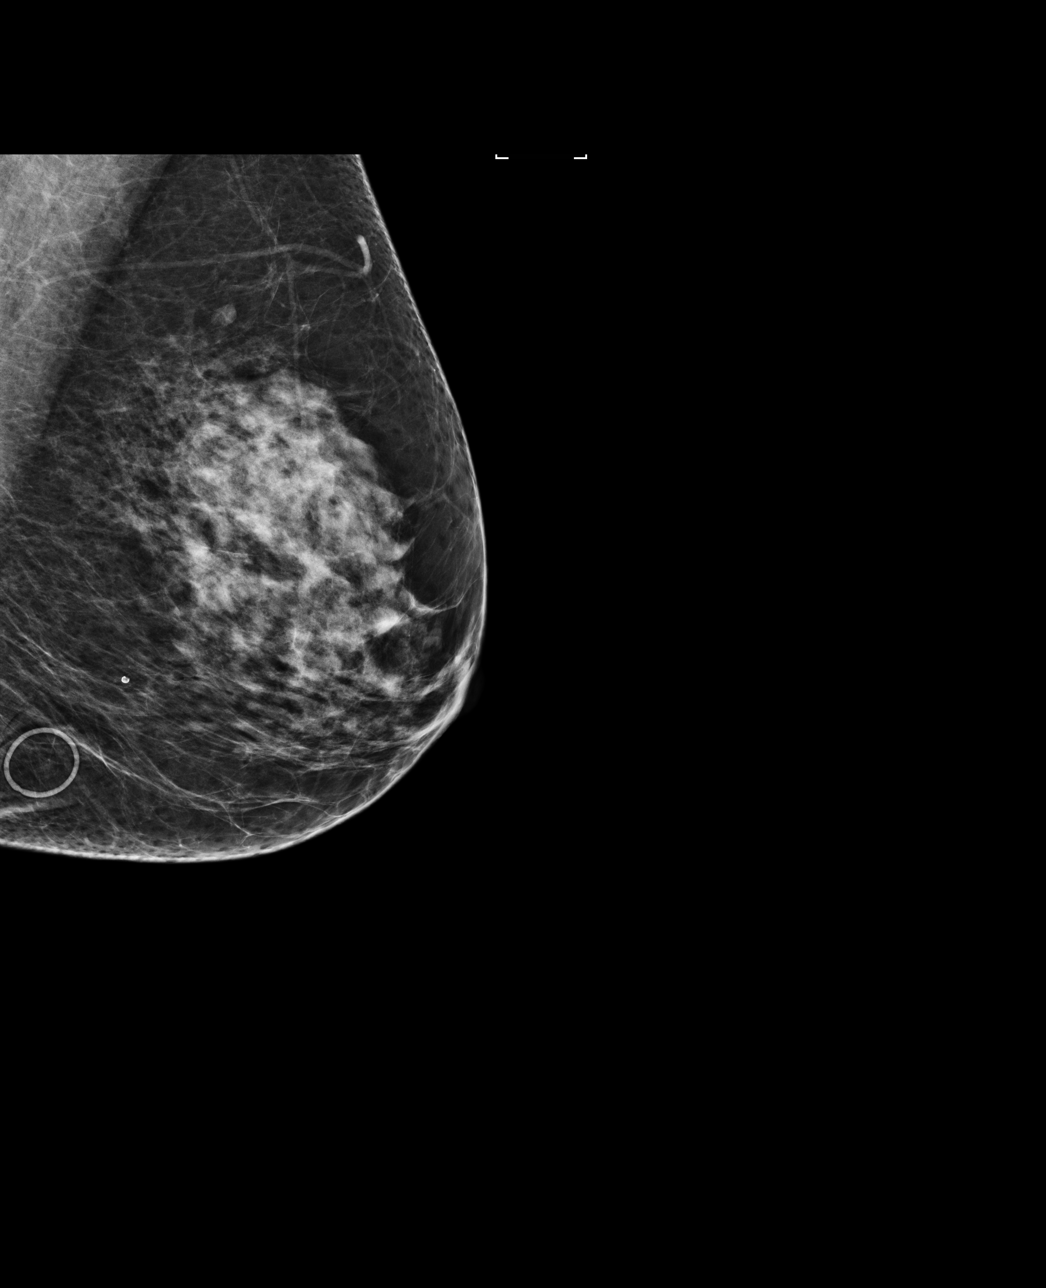

[L CC]
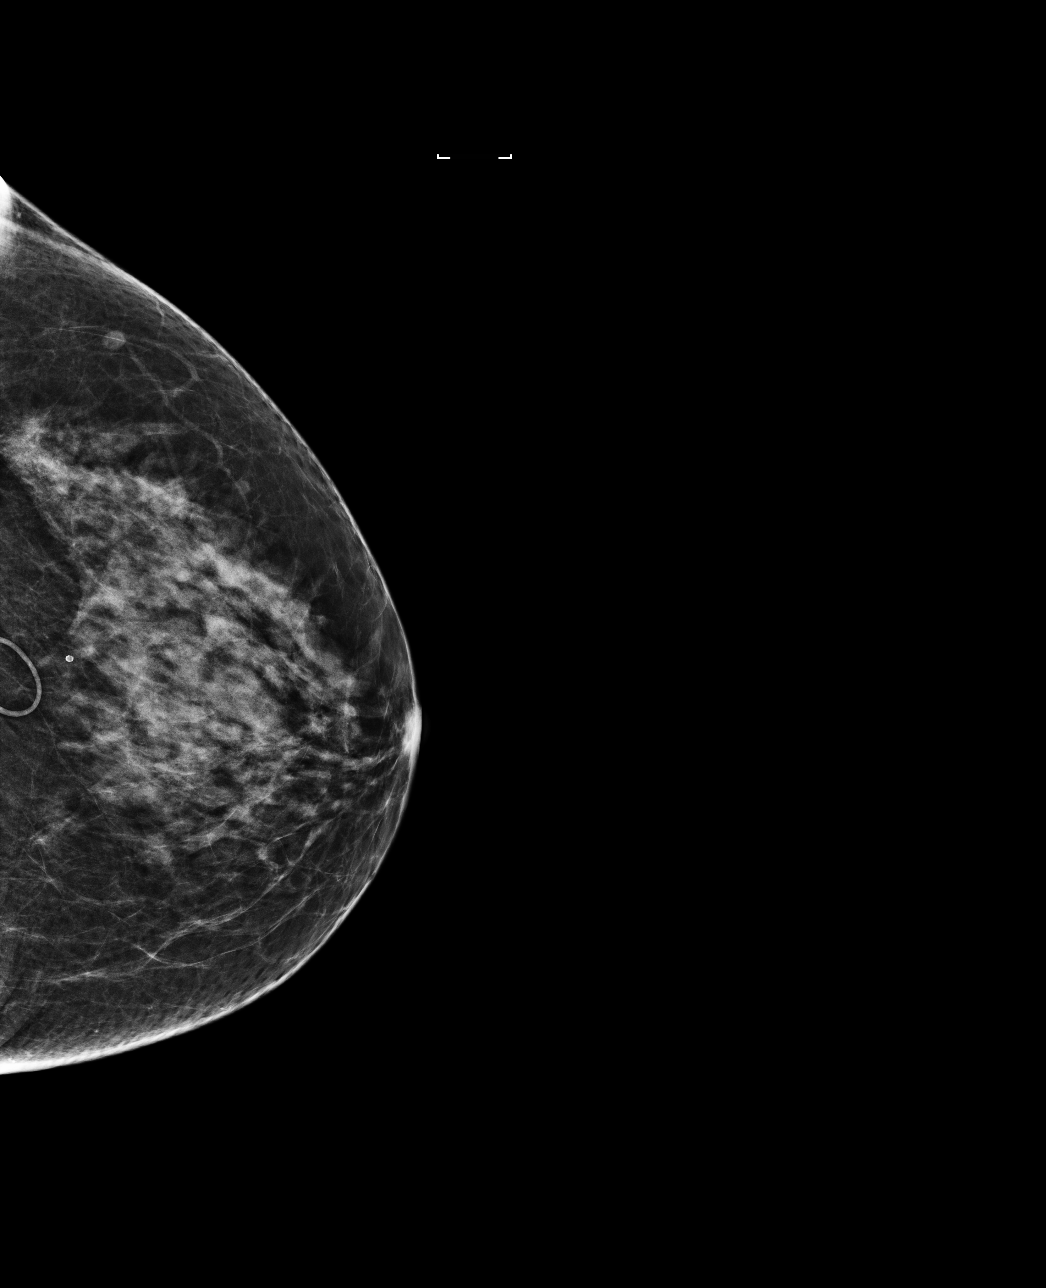

[R MLO]
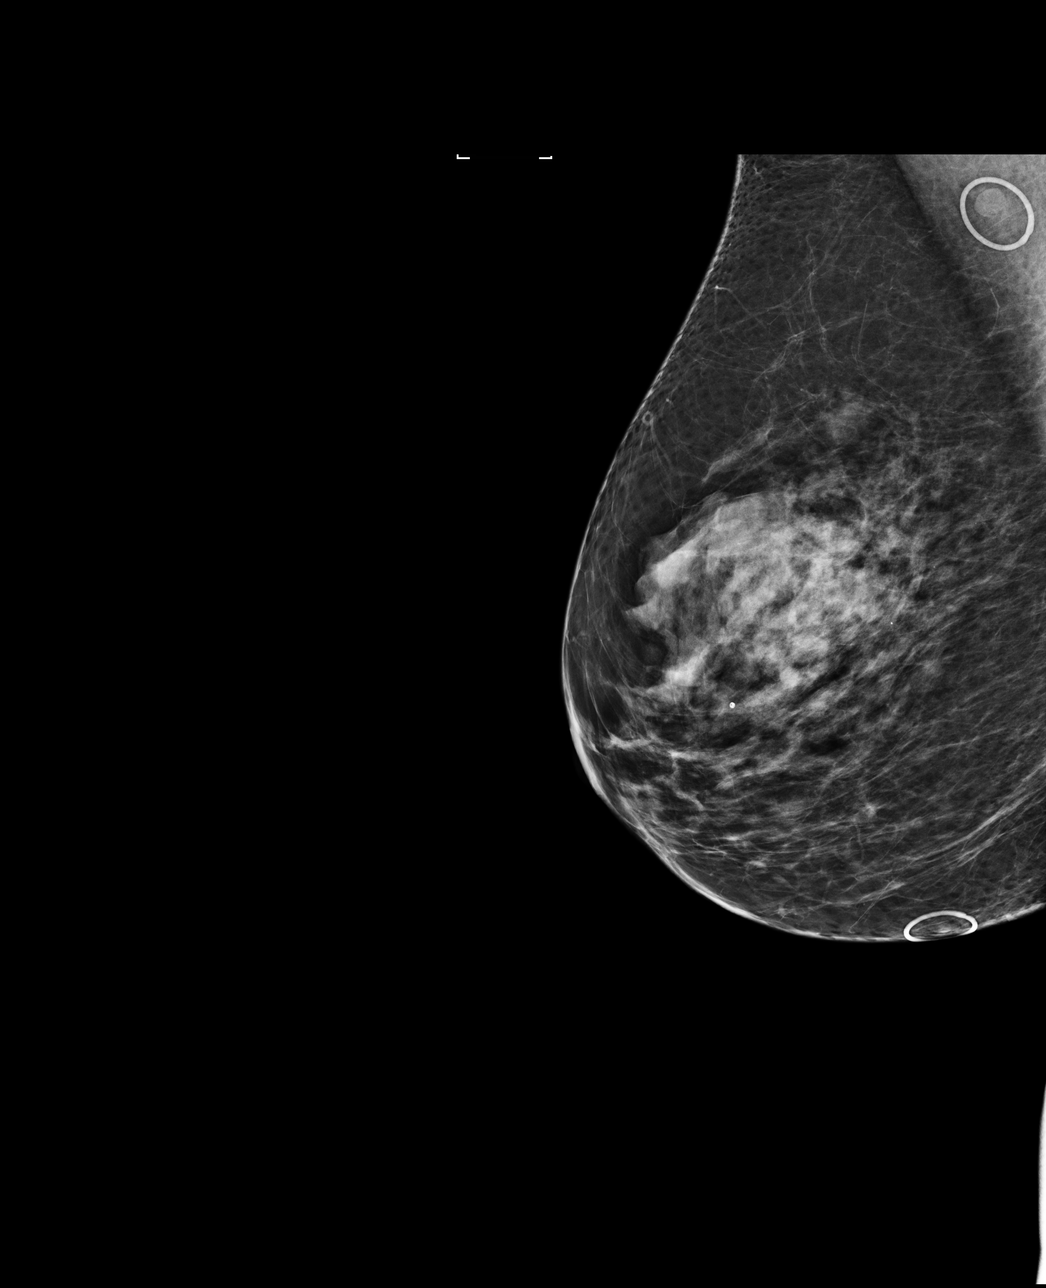

[4 of 4 positions shown; findings below may reference images not displayed]

ACR Breast Density Category c: The breast tissue is heterogeneously
dense, which may obscure small masses.
FINDINGS: There are no findings suspicious for malignancy. Images were
processed with CAD.
IMPRESSION: No mammographic evidence of malignancy. A result letter of this
screening mammogram will be mailed directly to the patient.

RECOMMENDATION:
Screening mammogram in one year. (Code:[0J])

BI-RADS CATEGORY  1: Negative.

## 2014-06-11 ENCOUNTER — Encounter: Payer: Self-pay | Admitting: *Deleted

## 2014-06-11 ENCOUNTER — Encounter: Payer: Self-pay | Admitting: Family Medicine

## 2014-06-19 ENCOUNTER — Telehealth: Payer: Self-pay | Admitting: Family Medicine

## 2014-06-19 DIAGNOSIS — Z0000 Encounter for general adult medical examination without abnormal findings: Secondary | ICD-10-CM

## 2014-06-19 NOTE — Telephone Encounter (Signed)
-----   Message from Ellamae Sia sent at 06/18/2014 11:40 AM EDT ----- Regarding: Lab orders for Friday, 6.3.16 Patient is scheduled for CPX labs, please order future labs, Thanks , Karna Christmas

## 2014-06-20 ENCOUNTER — Other Ambulatory Visit (INDEPENDENT_AMBULATORY_CARE_PROVIDER_SITE_OTHER): Payer: Managed Care, Other (non HMO)

## 2014-06-20 DIAGNOSIS — Z0000 Encounter for general adult medical examination without abnormal findings: Secondary | ICD-10-CM

## 2014-06-20 LAB — LIPID PANEL
Cholesterol: 158 mg/dL (ref 0–200)
HDL: 57.8 mg/dL (ref >39.00)
LDL CALC: 91 mg/dL (ref 0–99)
NONHDL: 100.2
Total CHOL/HDL Ratio: 3
Triglycerides: 48 mg/dL (ref 0.0–149.0)
VLDL: 9.6 mg/dL (ref 0.0–40.0)

## 2014-06-20 LAB — COMPREHENSIVE METABOLIC PANEL
ALT: 22 U/L (ref 0–35)
AST: 28 U/L (ref 0–37)
Albumin: 4.4 g/dL (ref 3.5–5.2)
Alkaline Phosphatase: 55 U/L (ref 39–117)
BILIRUBIN TOTAL: 0.6 mg/dL (ref 0.2–1.2)
BUN: 13 mg/dL (ref 6–23)
CHLORIDE: 105 meq/L (ref 96–112)
CO2: 28 mEq/L (ref 19–32)
Calcium: 9.5 mg/dL (ref 8.4–10.5)
Creatinine, Ser: 0.84 mg/dL (ref 0.40–1.20)
GFR: 73.44 mL/min (ref >60.00)
Glucose, Bld: 97 mg/dL (ref 70–99)
Potassium: 4.5 mEq/L (ref 3.5–5.1)
SODIUM: 138 meq/L (ref 135–145)
Total Protein: 7 g/dL (ref 6.0–8.3)

## 2014-06-20 LAB — CBC WITH DIFFERENTIAL/PLATELET
Basophils Absolute: 0 10*3/uL (ref 0.0–0.1)
Basophils Relative: 0.5 % (ref 0.0–3.0)
EOS ABS: 0 10*3/uL (ref 0.0–0.7)
EOS PCT: 1.2 % (ref 0.0–5.0)
HCT: 39.8 % (ref 36.0–46.0)
Hemoglobin: 13.6 g/dL (ref 12.0–15.0)
LYMPHS PCT: 28.3 % (ref 12.0–46.0)
Lymphs Abs: 1.1 10*3/uL (ref 0.7–4.0)
MCHC: 34.2 g/dL (ref 30.0–36.0)
MCV: 89.2 fl (ref 78.0–100.0)
MONOS PCT: 6.4 % (ref 3.0–12.0)
Monocytes Absolute: 0.3 10*3/uL (ref 0.1–1.0)
Neutro Abs: 2.6 10*3/uL (ref 1.4–7.7)
Neutrophils Relative %: 63.6 % (ref 43.0–77.0)
Platelets: 172 10*3/uL (ref 150.0–400.0)
RBC: 4.46 Mil/uL (ref 3.87–5.11)
RDW: 13.9 % (ref 11.5–15.5)
WBC: 4 10*3/uL (ref 4.0–10.5)

## 2014-06-20 LAB — TSH: TSH: 4.93 u[IU]/mL — AB (ref 0.35–4.50)

## 2014-06-25 ENCOUNTER — Ambulatory Visit (INDEPENDENT_AMBULATORY_CARE_PROVIDER_SITE_OTHER): Payer: Managed Care, Other (non HMO) | Admitting: Family Medicine

## 2014-06-25 ENCOUNTER — Encounter: Payer: Self-pay | Admitting: *Deleted

## 2014-06-25 ENCOUNTER — Encounter: Payer: Self-pay | Admitting: Family Medicine

## 2014-06-25 VITALS — BP 130/80 | HR 76 | Temp 99.1°F | Ht 65.5 in | Wt 147.5 lb

## 2014-06-25 DIAGNOSIS — Z0000 Encounter for general adult medical examination without abnormal findings: Secondary | ICD-10-CM | POA: Diagnosis not present

## 2014-06-25 DIAGNOSIS — R946 Abnormal results of thyroid function studies: Secondary | ICD-10-CM

## 2014-06-25 DIAGNOSIS — M899 Disorder of bone, unspecified: Secondary | ICD-10-CM | POA: Diagnosis not present

## 2014-06-25 DIAGNOSIS — Z1211 Encounter for screening for malignant neoplasm of colon: Secondary | ICD-10-CM

## 2014-06-25 DIAGNOSIS — R7989 Other specified abnormal findings of blood chemistry: Secondary | ICD-10-CM

## 2014-06-25 DIAGNOSIS — E039 Hypothyroidism, unspecified: Secondary | ICD-10-CM | POA: Insufficient documentation

## 2014-06-25 DIAGNOSIS — M949 Disorder of cartilage, unspecified: Secondary | ICD-10-CM

## 2014-06-25 NOTE — Progress Notes (Signed)
Pre visit review using our clinic review tool, if applicable. No additional management support is needed unless otherwise documented below in the visit note. 

## 2014-06-25 NOTE — Patient Instructions (Addendum)
Try the rhomboid stretch we reviewed  Please do the stool card for screening  We will watch the thyroid number  Don't forget to take your vitamin D for bone health and exercise

## 2014-06-25 NOTE — Progress Notes (Signed)
Subjective:    Patient ID: Denise Grant, female    DOB: [birth year], 59 y.o.   MRN: [id number]  HPI Here for health maintenance exam and to review chronic medical problems    Feels overall better than she did Has a catch in her shoulder blade R since this am  Also some ringing in her ears -hearing seems fine - with sinus symptoms   Wt is up 1 lb with bmi of 24  BP Readings from Last 3 Encounters:  06/25/14 146/78  05/21/14 122/84  10/02/13 146/88    Hep C/ HIV screening - declines/not high risk   Zoster vaccine -not interested   Colon cancer screening - still declines colonoscopy Will do IFOB card   Flu shot - had one in the fall   Mammogram 5/16 nl  Self exam - no lumps or changes   Pap 4/15 nl  No gyn problems or bleeding  No new sexual partners   Tdap 4/14  Results for orders placed or performed in visit on 06/20/14  CBC with Differential/Platelet  Result Value Ref Range   WBC 4.0 4.0 - 10.5 K/uL   RBC 4.46 3.87 - 5.11 Mil/uL   Hemoglobin 13.6 12.0 - 15.0 g/dL   HCT 39.8 36.0 - 46.0 %   MCV 89.2 78.0 - 100.0 fl   MCHC 34.2 30.0 - 36.0 g/dL   RDW 13.9 11.5 - 15.5 %   Platelets 172.0 150.0 - 400.0 K/uL   Neutrophils Relative % 63.6 43.0 - 77.0 %   Lymphocytes Relative 28.3 12.0 - 46.0 %   Monocytes Relative 6.4 3.0 - 12.0 %   Eosinophils Relative 1.2 0.0 - 5.0 %   Basophils Relative 0.5 0.0 - 3.0 %   Neutro Abs 2.6 1.4 - 7.7 K/uL   Lymphs Abs 1.1 0.7 - 4.0 K/uL   Monocytes Absolute 0.3 0.1 - 1.0 K/uL   Eosinophils Absolute 0.0 0.0 - 0.7 K/uL   Basophils Absolute 0.0 0.0 - 0.1 K/uL  Comprehensive metabolic panel  Result Value Ref Range   Sodium 138 135 - 145 mEq/L   Potassium 4.5 3.5 - 5.1 mEq/L   Chloride 105 96 - 112 mEq/L   CO2 28 19 - 32 mEq/L   Glucose, Bld 97 70 - 99 mg/dL   BUN 13 6 - 23 mg/dL   Creatinine, Ser 0.84 0.40 - 1.20 mg/dL   Total Bilirubin 0.6 0.2 - 1.2 mg/dL   Alkaline Phosphatase 55 39 - 117 U/L   AST 28 0 - 37 U/L   ALT  22 0 - 35 U/L   Total Protein 7.0 6.0 - 8.3 g/dL   Albumin 4.4 3.5 - 5.2 g/dL   Calcium 9.5 8.4 - 10.5 mg/dL   GFR 73.44 >60.00 mL/min  Lipid panel  Result Value Ref Range   Cholesterol 158 0 - 200 mg/dL   Triglycerides 48.0 0.0 - 149.0 mg/dL   HDL 57.80 >39.00 mg/dL   VLDL 9.6 0.0 - 40.0 mg/dL   LDL Cholesterol 91 0 - 99 mg/dL   Total CHOL/HDL Ratio 3    NonHDL 100.20   TSH  Result Value Ref Range   TSH 4.93 (H) 0.35 - 4.50 uIU/mL     Very good cholesterol profile  Diet is good most of the time    No thyroid problems in family  No goiter No symptoms   Patient Active Problem List   Diagnosis Date Noted  . Migraine with vertigo 05/21/2014  .  Sinusitis, acute 05/21/2014  . History of Chiari malformation 05/21/2014  . Spasm of back muscles 10/02/2013  . Atrophic vaginitis 05/01/2013  . Microhematuria 04/15/2013  . Colon cancer screening 04/20/2012  . Encounter for routine gynecological examination 11/24/2010  . Grief 11/24/2010  . Routine general medical examination at a health care facility 11/18/2010  . INSOMNIA 01/27/2010  . CONTACT DERMATITIS-NOS 03/31/2008  . HEADACHE 03/31/2008  . Disorder of bone and cartilage 09/18/2007  . ANXIETY 05/05/2006  . MITRAL VALVE PROLAPSE 05/05/2006  . ALLERGIC RHINITIS 05/05/2006  . FIBROCYSTIC BREAST DISEASE 05/05/2006  . SEBORRHEIC DERMATITIS 05/05/2006  . KERATOSIS, ACTINIC 05/05/2006  . NECK PAIN, CHRONIC 05/05/2006   Past Medical History  Diagnosis Date  . Allergic rhinitis   . Anxiety   . Osteopenia   . DDD (degenerative disc disease)   . Lactose intolerance   . Vaginal cyst     stable  . Diffuse cystic mastopathy   . Headache(784.0)   . Insomnia, unspecified   . Actinic keratosis   . Family history of other specified malignant neoplasm   . Mitral valve disorders   . Seborrheic dermatitis, unspecified   . Chiari malformation    Past Surgical History  Procedure Laterality Date  . Tubal ligation    . Lumbar  disc surgery    . Chiari malformation surgery  6/03  . Excision morton's neuroma  2/04  . Bone spur removal  10/06    shoulder  . Cervical disc surgery  2005   History  Substance Use Topics  . Smoking status: Former Smoker    Quit date: 01/17/2001  . Smokeless tobacco: Never Used  . Alcohol Use: No   Family History  Problem Relation Age of Onset  . Melanoma Sister   . Lung cancer Father   . Coronary artery disease Mother   . Heart failure Mother   . Hypertension Mother   . Anxiety disorder Son   . Lung cancer Brother     + smoker   Allergies  Allergen Reactions  . Calcium     REACTION: cannot tolerate any calcium supplement - GI sympotms  . Fluticasone Propionate     REACTION: headache/irritation  . Paroxetine     REACTION: abdominal pain and constipation   Current Outpatient Prescriptions on File Prior to Visit  Medication Sig Dispense Refill  . Chlorphen-Phenyleph-Ibuprofen (ADVIL ALLERGY & CONGESTION PO) Take by mouth as needed.    . cyclobenzaprine (FLEXERIL) 10 MG tablet Take 0.5-1 tablets (5-10 mg total) by mouth 3 (three) times daily as needed for muscle spasms. 30 tablet 1  . LORazepam (ATIVAN) 1 MG tablet TAKE ONE-HALF TABLET BY MOUTH AT BEDTIME AS NEEDED FOR ANXIETY 30 tablet 1  . meclizine (ANTIVERT) 25 MG tablet Take 1 tablet (25 mg total) by mouth 3 (three) times daily as needed for dizziness. 15 tablet 0   No current facility-administered medications on file prior to visit.      Review of Systems Review of Systems  Constitutional: Negative for fever, appetite change, fatigue and unexpected weight change.  Eyes: Negative for pain and visual disturbance.  ENt pos for ringing in R ear (mild) with recent sinus infection  Respiratory: Negative for cough and shortness of breath.   Cardiovascular: Negative for cp or palpitations    Gastrointestinal: Negative for nausea, diarrhea and constipation.  Genitourinary: Negative for urgency and frequency.  Skin:  Negative for pallor or rash   MSK pos for recent L shoulder blade area muscle  spasm  Neurological: Negative for weakness, light-headedness, numbness and headaches.  Hematological: Negative for adenopathy. Does not bruise/bleed easily.  Psychiatric/Behavioral: Negative for dysphoric mood. The patient is not nervous/anxious.         Objective:   Physical Exam  Constitutional: She appears well-developed and well-nourished. No distress.  HENT:  Head: Normocephalic and atraumatic.  Right Ear: External ear normal.  Left Ear: External ear normal.  Mouth/Throat: Oropharynx is clear and moist.  Nares are boggy TMs clear   Eyes: Conjunctivae and EOM are normal. Pupils are equal, round, and reactive to light. No scleral icterus.  Neck: Normal range of motion. Neck supple. No JVD present. Carotid bruit is not present. No thyromegaly present.  Cardiovascular: Normal rate, regular rhythm, normal heart sounds and intact distal pulses.  Exam reveals no gallop.   Pulmonary/Chest: Effort normal and breath sounds normal. No respiratory distress. She has no wheezes. She exhibits no tenderness.  Abdominal: Soft. Bowel sounds are normal. She exhibits no distension, no abdominal bruit and no mass. There is no tenderness.  Genitourinary: No breast swelling, tenderness, discharge or bleeding.  Breast exam: No mass, nodules, thickening, tenderness, bulging, retraction, inflamation, nipple discharge or skin changes noted.  No axillary or clavicular LA.      Musculoskeletal: Normal range of motion. She exhibits no edema or tenderness.  Tenderness in L rhomboid area that improves with stretching  Nl rom spine and neck  Lymphadenopathy:    She has no cervical adenopathy.  Neurological: She is alert. She has normal reflexes. No cranial nerve deficit. She exhibits normal muscle tone. Coordination normal.  Skin: Skin is warm and dry. No rash noted. No erythema. No pallor.  Psychiatric: She has a normal mood and  affect.          Assessment & Plan:   Problem List Items Addressed This Visit    Colon cancer screening    D/w patient [id number] for colon cancer screening, including IFOB vs. colonoscopy.  Risks and benefits of both were discussed and patient voiced understanding.  Pt elects for: IFOB card She declines colonoscopy       Relevant Orders   Fecal occult blood, imunochemical   Disorder of bone and cartilage    UTD dexa  No falls or fx Intol of ca but taking vit D Disc need for calcium/ vitamin D/ wt bearing exercise and bone density test every 2 y to monitor Disc safety/ fracture risk in detail         Elevated TSH   Routine general medical examination at a health care facility - Primary    Reviewed health habits including diet and exercise and skin cancer prevention Reviewed appropriate screening tests for age  Also reviewed health mt list, fam hx and immunization status , as well as social and family history    See HPI Labs reviewed  IFOB card for colon cancer screening (she declines colonoscopy)  Recommend annual flu shots Rev diet and exercise recommendations

## 2014-06-25 NOTE — Assessment & Plan Note (Signed)
Reviewed health habits including diet and exercise and skin cancer prevention Reviewed appropriate screening tests for age  Also reviewed health mt list, fam hx and immunization status , as well as social and family history    See HPI Labs reviewed  IFOB card for colon cancer screening (she declines colonoscopy)  Recommend annual flu shots Rev diet and exercise recommendations

## 2014-06-25 NOTE — Assessment & Plan Note (Signed)
D/w patient [id number] for colon cancer screening, including IFOB vs. colonoscopy.  Risks and benefits of both were discussed and patient voiced understanding.  Pt elects for: IFOB card She declines colonoscopy

## 2014-06-25 NOTE — Assessment & Plan Note (Signed)
UTD dexa  No falls or fx Intol of ca but taking vit D Disc need for calcium/ vitamin D/ wt bearing exercise and bone density test every 2 y to monitor Disc safety/ fracture risk in detail

## 2014-06-26 ENCOUNTER — Encounter: Payer: Self-pay | Admitting: Radiology

## 2014-06-26 ENCOUNTER — Telehealth: Payer: Self-pay | Admitting: Family Medicine

## 2014-06-26 NOTE — Telephone Encounter (Signed)
Pt is returning your call. Please call house number after 4, thanks.

## 2014-06-26 NOTE — Telephone Encounter (Signed)
Please return pt's call on cell thanks.

## 2014-06-27 NOTE — Telephone Encounter (Signed)
I didn't call pt 

## 2014-06-30 ENCOUNTER — Other Ambulatory Visit (INDEPENDENT_AMBULATORY_CARE_PROVIDER_SITE_OTHER): Payer: Managed Care, Other (non HMO)

## 2014-06-30 DIAGNOSIS — Z1211 Encounter for screening for malignant neoplasm of colon: Secondary | ICD-10-CM | POA: Diagnosis not present

## 2014-06-30 LAB — FECAL OCCULT BLOOD, IMMUNOCHEMICAL: Fecal Occult Bld: NEGATIVE

## 2014-06-30 LAB — FECAL OCCULT BLOOD, GUAIAC: Fecal Occult Blood: NEGATIVE

## 2014-07-01 ENCOUNTER — Encounter: Payer: Self-pay | Admitting: *Deleted

## 2014-07-01 ENCOUNTER — Encounter: Payer: Self-pay | Admitting: Family Medicine

## 2014-07-18 ENCOUNTER — Encounter: Payer: Self-pay | Admitting: Family Medicine

## 2014-12-04 ENCOUNTER — Other Ambulatory Visit: Payer: Self-pay | Admitting: Family Medicine

## 2014-12-08 ENCOUNTER — Other Ambulatory Visit: Payer: Self-pay | Admitting: Family Medicine

## 2014-12-08 NOTE — Telephone Encounter (Signed)
Electronic refill request, last refilled on 03/25/14 #30 with 1 additional refill, pt has CPE scheduled for 06/29/15, please advise

## 2014-12-08 NOTE — Telephone Encounter (Signed)
Px written for call in   

## 2014-12-08 NOTE — Telephone Encounter (Signed)
Rx called in as prescribed 

## 2014-12-29 ENCOUNTER — Telehealth: Payer: Self-pay | Admitting: Family Medicine

## 2014-12-29 NOTE — Telephone Encounter (Signed)
If she has the ability to check it at home - so so (and keep me posted) F/u this week for a visit please

## 2014-12-29 NOTE — Telephone Encounter (Signed)
Pt notified of Dr. Marliss Coots comments and appt scheduled with Dr. Glori Bickers

## 2014-12-29 NOTE — Telephone Encounter (Signed)
Pt called and was seen this morning by Dr. Sherwood Gambler with a high blood pressure. The nurse advised pt to call office and let Dr. Glori Bickers know it was running high with a reading of 177/81.  CB # [phone]

## 2014-12-31 ENCOUNTER — Ambulatory Visit (INDEPENDENT_AMBULATORY_CARE_PROVIDER_SITE_OTHER): Payer: Managed Care, Other (non HMO) | Admitting: Family Medicine

## 2014-12-31 ENCOUNTER — Encounter: Payer: Self-pay | Admitting: Family Medicine

## 2014-12-31 VITALS — BP 162/90 | HR 80 | Temp 98.6°F | Ht 65.5 in | Wt 152.8 lb

## 2014-12-31 DIAGNOSIS — I10 Essential (primary) hypertension: Secondary | ICD-10-CM

## 2014-12-31 MED ORDER — HYDROCHLOROTHIAZIDE 25 MG PO TABS: 25.0000 mg | ORAL_TABLET | Freq: Every day | ORAL | Status: DC

## 2014-12-31 NOTE — Patient Instructions (Signed)
Please work on exercise when you can  Also limit sodium - processed foods/ eating out  Start HCTZ 25 mg each am - if any intolerable side effects such as low blood pressure or dizziness - stop it and let me know  Take care of yourself Follow up with me in about 4 weeks - we will do labs that day as well

## 2014-12-31 NOTE — Progress Notes (Signed)
Subjective:    Patient ID: Denise Grant, female    DOB: [birth year], 59 y.o.   MRN: [id number]  HPI Here for elevated bp   Saw her neurosurgeon recently -- had MRI that looked ok - still having muscular pain   bp was up there 177/81- thought due to pain  Today 152/86  BP Readings from Last 3 Encounters:  12/31/14 152/86  06/25/14 130/80  05/21/14 122/84    Has a family hx of HTN   Wt is up 5 lbs with bmi of 25   Not exercising much  Does eat a fair amt of sodium   No headaches  occ her face flushes/cheeks  No cp   Some anxiety attacks however   She plans to start back walking   Patient Active Problem List   Diagnosis Date Noted  . Elevated TSH 06/25/2014  . Migraine with vertigo 05/21/2014  . Sinusitis, acute 05/21/2014  . History of Chiari malformation 05/21/2014  . Spasm of back muscles 10/02/2013  . Atrophic vaginitis 05/01/2013  . Microhematuria 04/15/2013  . Colon cancer screening 04/20/2012  . Encounter for routine gynecological examination 11/24/2010  . Grief 11/24/2010  . Routine general medical examination at a health care facility 11/18/2010  . INSOMNIA 01/27/2010  . CONTACT DERMATITIS-NOS 03/31/2008  . HEADACHE 03/31/2008  . Disorder of bone and cartilage 09/18/2007  . ANXIETY 05/05/2006  . MITRAL VALVE PROLAPSE 05/05/2006  . ALLERGIC RHINITIS 05/05/2006  . FIBROCYSTIC BREAST DISEASE 05/05/2006  . SEBORRHEIC DERMATITIS 05/05/2006  . KERATOSIS, ACTINIC 05/05/2006  . NECK PAIN, CHRONIC 05/05/2006   Past Medical History  Diagnosis Date  . Allergic rhinitis   . Anxiety   . Osteopenia   . DDD (degenerative disc disease)   . Lactose intolerance   . Vaginal cyst     stable  . Diffuse cystic mastopathy   . Headache(784.0)   . Insomnia, unspecified   . Actinic keratosis   . Family history of other specified malignant neoplasm   . Mitral valve disorders   . Seborrheic dermatitis, unspecified   . Chiari malformation    Past Surgical  History  Procedure Laterality Date  . Tubal ligation    . Lumbar disc surgery    . Chiari malformation surgery  6/03  . Excision morton's neuroma  2/04  . Bone spur removal  10/06    shoulder  . Cervical disc surgery  2005   Social History  Substance Use Topics  . Smoking status: Former Smoker    Quit date: 01/17/2001  . Smokeless tobacco: Never Used  . Alcohol Use: No   Family History  Problem Relation Age of Onset  . Melanoma Sister   . Lung cancer Father   . Coronary artery disease Mother   . Heart failure Mother   . Hypertension Mother   . Anxiety disorder Son   . Lung cancer Brother     + smoker   Allergies  Allergen Reactions  . Calcium     REACTION: cannot tolerate any calcium supplement - GI sympotms  . Fluticasone Propionate     REACTION: headache/irritation  . Paroxetine     REACTION: abdominal pain and constipation   Current Outpatient Prescriptions on File Prior to Visit  Medication Sig Dispense Refill  . Chlorphen-Phenyleph-Ibuprofen (ADVIL ALLERGY & CONGESTION PO) Take by mouth as needed.    . cyclobenzaprine (FLEXERIL) 10 MG tablet Take 0.5-1 tablets (5-10 mg total) by mouth 3 (three) times daily as needed for muscle  spasms. 30 tablet 1  . LORazepam (ATIVAN) 1 MG tablet TAKE ONE-HALF TABLET BY MOUTH AT BEDTIME AS NEEDED 30 tablet 1  . meclizine (ANTIVERT) 25 MG tablet Take 1 tablet (25 mg total) by mouth 3 (three) times daily as needed for dizziness. 15 tablet 0   No current facility-administered medications on file prior to visit.     Review of Systems Review of Systems  Constitutional: Negative for fever, appetite change, fatigue and unexpected weight change.  Eyes: Negative for pain and visual disturbance.  Respiratory: Negative for cough and shortness of breath.   Cardiovascular: Negative for cp or palpitations    Gastrointestinal: Negative for nausea, diarrhea and constipation.  Genitourinary: Negative for urgency and frequency.  Skin:  Negative for pallor or rash   MSK pos for chronic pain  Neurological: Negative for weakness, light-headedness, numbness and headaches.  Hematological: Negative for adenopathy. Does not bruise/bleed easily.  Psychiatric/Behavioral: Negative for dysphoric mood. The patient is nervous/anxious.         Objective:   Physical Exam  Constitutional: She appears well-developed and well-nourished. No distress.  Well appearing  HENT:  Head: Normocephalic and atraumatic.  Mouth/Throat: Oropharynx is clear and moist.  Eyes: Conjunctivae and EOM are normal. Pupils are equal, round, and reactive to light.  Neck: Normal range of motion. Neck supple. No JVD present. Carotid bruit is not present. No thyromegaly present.  Cardiovascular: Normal rate, regular rhythm, normal heart sounds and intact distal pulses.  Exam reveals no gallop.   Pulmonary/Chest: Effort normal and breath sounds normal. No respiratory distress. She has no wheezes. She has no rales.  No crackles  Abdominal: Soft. Bowel sounds are normal. She exhibits no distension, no abdominal bruit and no mass. There is no tenderness.  Musculoskeletal: She exhibits no edema.  Lymphadenopathy:    She has no cervical adenopathy.  Neurological: She is alert. She has normal reflexes.  Skin: Skin is warm and dry. No rash noted.  Psychiatric: She has a normal mood and affect.          Assessment & Plan:   Problem List Items Addressed This Visit      Cardiovascular and Mediastinum   Essential hypertension - Primary    BP: (!) 162/90 mmHg    Will begin HCTZ 25 mg daily  Rev poss side eff  F/u 4 wk for visit with labs Disc lifestyle change incl DASH diet and exercise        Relevant Medications   hydrochlorothiazide (HYDRODIURIL) 25 MG tablet

## 2014-12-31 NOTE — Progress Notes (Signed)
Pre visit review using our clinic review tool, if applicable. No additional management support is needed unless otherwise documented below in the visit note. 

## 2015-01-01 DIAGNOSIS — I10 Essential (primary) hypertension: Secondary | ICD-10-CM | POA: Insufficient documentation

## 2015-01-01 NOTE — Assessment & Plan Note (Signed)
BP: (!) 162/90 mmHg    Will begin HCTZ 25 mg daily  Rev poss side eff  F/u 4 wk for visit with labs Disc lifestyle change incl DASH diet and exercise

## 2015-01-05 ENCOUNTER — Telehealth: Payer: Self-pay

## 2015-01-05 ENCOUNTER — Encounter: Payer: Self-pay | Admitting: Family Medicine

## 2015-01-05 ENCOUNTER — Ambulatory Visit (INDEPENDENT_AMBULATORY_CARE_PROVIDER_SITE_OTHER): Payer: Managed Care, Other (non HMO) | Admitting: Family Medicine

## 2015-01-05 VITALS — BP 154/78 | HR 90 | Temp 98.8°F | Ht 65.5 in | Wt 146.2 lb

## 2015-01-05 DIAGNOSIS — I10 Essential (primary) hypertension: Secondary | ICD-10-CM

## 2015-01-05 MED ORDER — AMLODIPINE BESYLATE 5 MG PO TABS: 5.0000 mg | ORAL_TABLET | Freq: Every day | ORAL | Status: DC

## 2015-01-05 NOTE — Patient Instructions (Signed)
Stay off of hctz  Start amlodipine 5 mg -- 1/2 pill daily to start- you can increase to a whole pill if no side effects after 2 weeks Keep your follow up appointment  Goal for blood pressure is below 140 on top and below 90 on bottom  My favorite cuff for home is OMRON brand for the arm - size regular

## 2015-01-05 NOTE — Progress Notes (Signed)
Pre visit review using our clinic review tool, if applicable. No additional management support is needed unless otherwise documented below in the visit note. 

## 2015-01-05 NOTE — Telephone Encounter (Signed)
Will see her then 

## 2015-01-05 NOTE — Telephone Encounter (Signed)
Pt has appt 01/05/15 at 3PM with Dr Glori Bickers.

## 2015-01-05 NOTE — Progress Notes (Signed)
Subjective:    Patient ID: Denise Grant, female    DOB: [birth year], 59 y.o.   MRN: [id number]  HPI Here with eye problems -- ? Due to her bp medicine (hctz)   Her eyes became blood shot / looked like blood vessels popped  Stung a bit  Not watering or itching or swelling  Read the side effects of hctz  This was one of them- eye pain  A little blurred vision  No change in colored vision   Is feeling better   BP: (!) 154/78 mmHg  Not a big salt eater   Patient Active Problem List   Diagnosis Date Noted  . Essential hypertension 01/01/2015  . Elevated TSH 06/25/2014  . Migraine with vertigo 05/21/2014  . Sinusitis, acute 05/21/2014  . History of Chiari malformation 05/21/2014  . Spasm of back muscles 10/02/2013  . Atrophic vaginitis 05/01/2013  . Microhematuria 04/15/2013  . Colon cancer screening 04/20/2012  . Encounter for routine gynecological examination 11/24/2010  . Grief 11/24/2010  . Routine general medical examination at a health care facility 11/18/2010  . INSOMNIA 01/27/2010  . CONTACT DERMATITIS-NOS 03/31/2008  . HEADACHE 03/31/2008  . Disorder of bone and cartilage 09/18/2007  . ANXIETY 05/05/2006  . MITRAL VALVE PROLAPSE 05/05/2006  . ALLERGIC RHINITIS 05/05/2006  . FIBROCYSTIC BREAST DISEASE 05/05/2006  . SEBORRHEIC DERMATITIS 05/05/2006  . KERATOSIS, ACTINIC 05/05/2006  . NECK PAIN, CHRONIC 05/05/2006   Past Medical History  Diagnosis Date  . Allergic rhinitis   . Anxiety   . Osteopenia   . DDD (degenerative disc disease)   . Lactose intolerance   . Vaginal cyst     stable  . Diffuse cystic mastopathy   . Headache(784.0)   . Insomnia, unspecified   . Actinic keratosis   . Family history of other specified malignant neoplasm   . Mitral valve disorders   . Seborrheic dermatitis, unspecified   . Chiari malformation    Past Surgical History  Procedure Laterality Date  . Tubal ligation    . Lumbar disc surgery    . Chiari malformation  surgery  6/03  . Excision morton's neuroma  2/04  . Bone spur removal  10/06    shoulder  . Cervical disc surgery  2005   Social History  Substance Use Topics  . Smoking status: Former Smoker    Quit date: 01/17/2001  . Smokeless tobacco: Never Used  . Alcohol Use: No   Family History  Problem Relation Age of Onset  . Melanoma Sister   . Lung cancer Father   . Coronary artery disease Mother   . Heart failure Mother   . Hypertension Mother   . Anxiety disorder Son   . Lung cancer Brother     + smoker   Allergies  Allergen Reactions  . Calcium     REACTION: cannot tolerate any calcium supplement - GI sympotms  . Fluticasone Propionate     REACTION: headache/irritation  . Paroxetine     REACTION: abdominal pain and constipation   Current Outpatient Prescriptions on File Prior to Visit  Medication Sig Dispense Refill  . Chlorphen-Phenyleph-Ibuprofen (ADVIL ALLERGY & CONGESTION PO) Take by mouth as needed.    . cyclobenzaprine (FLEXERIL) 10 MG tablet Take 0.5-1 tablets (5-10 mg total) by mouth 3 (three) times daily as needed for muscle spasms. 30 tablet 1  . LORazepam (ATIVAN) 1 MG tablet TAKE ONE-HALF TABLET BY MOUTH AT BEDTIME AS NEEDED 30 tablet 1  . meclizine (  ANTIVERT) 25 MG tablet Take 1 tablet (25 mg total) by mouth 3 (three) times daily as needed for dizziness. 15 tablet 0  . hydrochlorothiazide (HYDRODIURIL) 25 MG tablet Take 1 tablet (25 mg total) by mouth daily. (Patient not taking: Reported on 01/05/2015) 30 tablet 11   No current facility-administered medications on file prior to visit.      Review of Systems    Review of Systems  Constitutional: Negative for fever, appetite change, fatigue and unexpected weight change.  Eyes: pos for eye discomfort and brief vision blurring, neg for drainage or swelling ENT pos for ears itching  Respiratory: Negative for cough and shortness of breath.   Cardiovascular: Negative for cp or palpitations    Gastrointestinal:  Negative for nausea, diarrhea and constipation.  Genitourinary: Negative for urgency and frequency.  Skin: Negative for pallor or rash  neg for hives  Neurological: Negative for weakness, light-headedness, numbness and headaches.  Hematological: Negative for adenopathy. Does not bruise/bleed easily.  Psychiatric/Behavioral: Negative for dysphoric mood. The patient is not nervous/anxious.      Objective:   Physical Exam  Constitutional: She appears well-developed and well-nourished. No distress.  HENT:  Head: Normocephalic and atraumatic.  Mouth/Throat: Oropharynx is clear and moist.  Eyes: Conjunctivae and EOM are normal. Pupils are equal, round, and reactive to light. Right eye exhibits no discharge. Left eye exhibits no discharge. No scleral icterus.  Very mild conj injection (prominent vessels in eyes w/o hemorrhage) No swelling or vision change  Neck: Normal range of motion. Neck supple. No JVD present. Carotid bruit is not present. No thyromegaly present.  Cardiovascular: Normal rate, regular rhythm, normal heart sounds and intact distal pulses.  Exam reveals no gallop.   Pulmonary/Chest: Effort normal and breath sounds normal. No respiratory distress. She has no wheezes. She has no rales.  No crackles  Abdominal: Soft. Bowel sounds are normal. She exhibits no distension, no abdominal bruit and no mass. There is no tenderness.  Musculoskeletal: She exhibits no edema.  Lymphadenopathy:    She has no cervical adenopathy.  Neurological: She is alert. She has normal reflexes.  Skin: Skin is warm and dry. No rash noted.  Psychiatric: She has a normal mood and affect.          Assessment & Plan:   Problem List Items Addressed This Visit      Cardiovascular and Mediastinum   Essential hypertension - Primary    Intolerant of hctz- caused eye discomfort and redness  Will try amlodipine - start at 2.5 and titrate to 5 as tol  Consider bp cuff for home- recommend omron brand  Rev  lifestyle change and DASH diet  F/u as planned   Update if side effects or problems       Relevant Medications   amLODipine (NORVASC) 5 MG tablet

## 2015-01-05 NOTE — Telephone Encounter (Signed)
PLEASE NOTE: All timestamps contained within this report are represented as [place] Standard Time. CONFIDENTIALTY NOTICE: This fax transmission is intended only for the addressee. It contains information that is legally privileged, confidential or otherwise protected from use or disclosure. If you are not the intended recipient, you are strictly prohibited from reviewing, disclosing, copying using or disseminating any of this information or taking any action in reliance on or regarding this information. If you have received this fax in error, please notify us immediately by telephone so that we can arrange for its return to [place]. Phone: [phone], Toll-Free: [phone], Fax: [phone] Page: 1 of 2 Call Id: [id number] [place] Patient Name: Denise Grant Gender: Female DOB: Jul 28, 1955 Age: 59 Y 9 M 26 D Return Phone Number: [phone] (Primary), [phone] (Secondary) [place] [address] Client [place] Night - Client Client Site [facility] [place] Contact Type Call Call Type Triage / Clinical Relationship To Patient Self Return Phone Number [phone] (Primary) Chief Complaint Eye Redness Initial Comment Caller States her eyes are blood shot, started a new blood pressure medication, did not take any today when she saw her eyes were blood shot PreDisposition Call Doctor Nurse Assessment Nurse: Koleen Nimrod, RN, Normand Sloop Date/Time (Eastern Time): 01/05/2015 8:56:57 AM Confirm and document reason for call. If symptomatic, describe symptoms. ---pt has red sclera since saturday. started a new BP medication on thursday. no drainage Has the patient traveled out of the country within the last 30 days? ---No Does the patient have any new or worsening symptoms? ---Yes Will a triage be  completed? ---Yes Related visit to physician within the last 2 weeks? ---No Does the PT have any chronic conditions? (i.e. diabetes, asthma, etc.) ---Yes List chronic conditions. ---hypertension Is this a behavioral health or substance abuse call? ---No Guidelines Guideline Title Affirmed Question Affirmed Notes Nurse Date/Time (Eastern Time) Eye - Red Without Pus Bleeding on white of the eye Koleen Nimrod, RN, Normand Sloop 01/05/2015 8:58:12 AM Disp. Time Eilene Ghazi Time) Disposition Final User 01/05/2015 9:04:36 AM See PCP When Office is Open (within 3 days) Yes Koleen Nimrod, RN, Walker Shadow Understands: Yes Disagree/Comply: Comply PLEASE NOTE: All timestamps contained within this report are represented as [place] Standard Time. CONFIDENTIALTY NOTICE: This fax transmission is intended only for the addressee. It contains information that is legally privileged, confidential or otherwise protected from use or disclosure. If you are not the intended recipient, you are strictly prohibited from reviewing, disclosing, copying using or disseminating any of this information or taking any action in reliance on or regarding this information. If you have received this fax in error, please notify us immediately by telephone so that we can arrange for its return to [place]. Phone: [phone], Toll-Free: [phone], Fax: [phone] Page: 2 of 2 Call Id: [id number] Care Advice Given Per Guideline SEE PCP WITHIN 3 DAYS: * You need to be seen within 2 or 3 days. Call your doctor during regular office hours and make an appointment. An urgent care center is often the best source of care if your doctor's office is closed or you can't get an appointment. NOTE: If office will be open tomorrow, tell caller to call then, not in 3 days. REASSURANCE: * The fragile blood vessels in the white area of the eye can bleed as a result of minor and sometimes forgotten trauma, or from forceful coughing or  vomiting. Sometimes this  can occur in people with pink-eye. * It looks like a small red bruise on one eye. * It is usually not serious. CALL BACK IF: * Severe or increasing pain * Pus or discharge occurs * Blurred vision occurs * You become worse. CARE ADVICE given per Eye - Red without Pus (Adult) guideline. After Care Instructions Given Call Event Type User Date / Time Description Comments User: Conception Oms, RN Date/Time Eilene Ghazi Time): 01/05/2015 9:05:11 AM pt conferenced in with dr' office for appt. Referrals REFERRED TO PCP OFFICE

## 2015-01-05 NOTE — Assessment & Plan Note (Signed)
Intolerant of hctz- caused eye discomfort and redness  Will try amlodipine - start at 2.5 and titrate to 5 as tol  Consider bp cuff for home- recommend omron brand  Rev lifestyle change and DASH diet  F/u as planned   Update if side effects or problems

## 2015-01-28 ENCOUNTER — Encounter: Payer: Self-pay | Admitting: Radiology

## 2015-01-28 ENCOUNTER — Encounter: Payer: Self-pay | Admitting: Family Medicine

## 2015-01-28 ENCOUNTER — Ambulatory Visit (INDEPENDENT_AMBULATORY_CARE_PROVIDER_SITE_OTHER): Payer: Managed Care, Other (non HMO) | Admitting: Family Medicine

## 2015-01-28 VITALS — BP 130/70 | HR 79 | Temp 98.3°F | Ht 65.5 in | Wt 149.2 lb

## 2015-01-28 DIAGNOSIS — F411 Generalized anxiety disorder: Secondary | ICD-10-CM

## 2015-01-28 DIAGNOSIS — M542 Cervicalgia: Secondary | ICD-10-CM | POA: Diagnosis not present

## 2015-01-28 DIAGNOSIS — I10 Essential (primary) hypertension: Secondary | ICD-10-CM | POA: Diagnosis not present

## 2015-01-28 MED ORDER — AMLODIPINE BESYLATE 5 MG PO TABS: 5.0000 mg | ORAL_TABLET | Freq: Every day | ORAL | Status: DC

## 2015-01-28 NOTE — Progress Notes (Signed)
Pre visit review using our clinic review tool, if applicable. No additional management support is needed unless otherwise documented below in the visit note. 

## 2015-01-28 NOTE — Assessment & Plan Note (Signed)
Pt states she saw Dr Sherwood Gambler recently - MRI was ok  Given diclofenac

## 2015-01-28 NOTE — Assessment & Plan Note (Signed)
Due for toxicology screen today  Took 1/4 of ativan pill this am (unsure if it will show up in specimen) Controls her symptoms well

## 2015-01-28 NOTE — Progress Notes (Signed)
Subjective:    Patient ID: Denise Grant, female    DOB: 15-Jul-1955, 60 y.o.   MRN: [id number]  HPI Here for f/u of HTN   hctz not tolerated  Started amlodipine last visit 5 mg daily  No side effects   Wt is up 3 lb   BP Readings from Last 3 Encounters:  01/28/15 138/80  01/05/15 154/78  12/31/14 162/90     At home 120s-130s/70s-80s Feeling good No symptoms   Less exercise lately-getting back to it  watchinf sodium intake and trying to eat less processed foods (cooks more)     Chemistry      Component Value Date/Time   NA 138 06/20/2014 0828   K 4.5 06/20/2014 0828   CL 105 06/20/2014 0828   CO2 28 06/20/2014 0828   BUN 13 06/20/2014 0828   CREATININE 0.84 06/20/2014 0828      Component Value Date/Time   CALCIUM 9.5 06/20/2014 0828   ALKPHOS 55 06/20/2014 0828   AST 28 06/20/2014 0828   ALT 22 06/20/2014 0828   BILITOT 0.6 06/20/2014 0828      Last took lorazepam - took 1/4 of one this am  Due for tox screen   Patient Active Problem List   Diagnosis Date Noted  . Essential hypertension 01/01/2015  . Elevated TSH 06/25/2014  . Migraine with vertigo 05/21/2014  . Sinusitis, acute 05/21/2014  . History of Chiari malformation 05/21/2014  . Spasm of back muscles 10/02/2013  . Atrophic vaginitis 05/01/2013  . Microhematuria 04/15/2013  . Colon cancer screening 04/20/2012  . Encounter for routine gynecological examination 11/24/2010  . Grief 11/24/2010  . Routine general medical examination at a health care facility 11/18/2010  . INSOMNIA 01/27/2010  . CONTACT DERMATITIS-NOS 03/31/2008  . HEADACHE 03/31/2008  . Disorder of bone and cartilage 09/18/2007  . ANXIETY 05/05/2006  . MITRAL VALVE PROLAPSE 05/05/2006  . ALLERGIC RHINITIS 05/05/2006  . FIBROCYSTIC BREAST DISEASE 05/05/2006  . SEBORRHEIC DERMATITIS 05/05/2006  . KERATOSIS, ACTINIC 05/05/2006  . NECK PAIN, CHRONIC 05/05/2006   Past Medical History  Diagnosis Date  . Allergic rhinitis   .  Anxiety   . Osteopenia   . DDD (degenerative disc disease)   . Lactose intolerance   . Vaginal cyst     stable  . Diffuse cystic mastopathy   . Headache(784.0)   . Insomnia, unspecified   . Actinic keratosis   . Family history of other specified malignant neoplasm   . Mitral valve disorders   . Seborrheic dermatitis, unspecified   . Chiari malformation    Past Surgical History  Procedure Laterality Date  . Tubal ligation    . Lumbar disc surgery    . Chiari malformation surgery  6/03  . Excision morton's neuroma  2/04  . Bone spur removal  10/06    shoulder  . Cervical disc surgery  2005   Social History  Substance Use Topics  . Smoking status: Former Smoker    Quit date: 01/17/2001  . Smokeless tobacco: Never Used  . Alcohol Use: No   Family History  Problem Relation Age of Onset  . Melanoma Sister   . Lung cancer Father   . Coronary artery disease Mother   . Heart failure Mother   . Hypertension Mother   . Anxiety disorder Son   . Lung cancer Brother     + smoker   Allergies  Allergen Reactions  . Calcium     REACTION: cannot tolerate any  calcium supplement - GI sympotms  . Fluticasone Propionate     REACTION: headache/irritation  . Hctz [Hydrochlorothiazide] Other (See Comments)    Eye redness and discomfort/ slt blurred vision   . Paroxetine     REACTION: abdominal pain and constipation   Current Outpatient Prescriptions on File Prior to Visit  Medication Sig Dispense Refill  . amLODipine (NORVASC) 5 MG tablet Take 1 tablet (5 mg total) by mouth daily. 30 tablet 3  . Chlorphen-Phenyleph-Ibuprofen (ADVIL ALLERGY & CONGESTION PO) Take by mouth as needed.    . cyclobenzaprine (FLEXERIL) 10 MG tablet Take 0.5-1 tablets (5-10 mg total) by mouth 3 (three) times daily as needed for muscle spasms. 30 tablet 1  . LORazepam (ATIVAN) 1 MG tablet TAKE ONE-HALF TABLET BY MOUTH AT BEDTIME AS NEEDED 30 tablet 1  . meclizine (ANTIVERT) 25 MG tablet Take 1 tablet (25 mg  total) by mouth 3 (three) times daily as needed for dizziness. 15 tablet 0   No current facility-administered medications on file prior to visit.      Review of Systems Review of Systems  Constitutional: Negative for fever, appetite change, fatigue and unexpected weight change.  Eyes: Negative for pain and visual disturbance.  Respiratory: Negative for cough and shortness of breath.   Cardiovascular: Negative for cp or palpitations    Gastrointestinal: Negative for nausea, diarrhea and constipation.  Genitourinary: Negative for urgency and frequency.  Skin: Negative for pallor or rash   MSK pos for neck pain that is improved  Neurological: Negative for weakness, light-headedness, numbness and headaches.  Hematological: Negative for adenopathy. Does not bruise/bleed easily.  Psychiatric/Behavioral: Negative for dysphoric mood. The patient is not nervous/anxious.         Objective:   Physical Exam  Constitutional: She appears well-developed and well-nourished. No distress.  Well app   HENT:  Head: Normocephalic and atraumatic.  Mouth/Throat: Oropharynx is clear and moist.  Eyes: Conjunctivae and EOM are normal. Pupils are equal, round, and reactive to light.  Neck: Normal range of motion. Neck supple. No JVD present. Carotid bruit is not present. No thyromegaly present.  Cardiovascular: Normal rate, regular rhythm, normal heart sounds and intact distal pulses.  Exam reveals no gallop.   No murmur heard. Pulmonary/Chest: Effort normal and breath sounds normal. No respiratory distress. She has no wheezes. She has no rales.  No crackles  Abdominal: Soft. Bowel sounds are normal. She exhibits no distension, no abdominal bruit and no mass. There is no tenderness.  Musculoskeletal: She exhibits no edema.  Lymphadenopathy:    She has no cervical adenopathy.  Neurological: She is alert. She has normal reflexes.  Skin: Skin is warm and dry. No rash noted.  Psychiatric: She has a normal  mood and affect.          Assessment & Plan:   Problem List Items Addressed This Visit      Cardiovascular and Mediastinum   Essential hypertension - Primary    Improved with amlodipine 5 mg daily (after intol of hctz) Continue this BP: 130/70 mmHg  Continue to work on lifestyle change F/u June as planned for PE         Relevant Medications   amLODipine (NORVASC) 5 MG tablet     Other   Generalized anxiety disorder    Due for toxicology screen today  Took 1/4 of ativan pill this am (unsure if it will show up in specimen) Controls her symptoms well       NECK  PAIN, CHRONIC    Pt states she saw Dr Sherwood Gambler recently - MRI was ok  Given diclofenac

## 2015-01-28 NOTE — Assessment & Plan Note (Addendum)
Improved with amlodipine 5 mg daily (after intol of hctz) Continue this BP: 130/70 mmHg  Continue to work on lifestyle change F/u June as planned for PE

## 2015-01-28 NOTE — Patient Instructions (Addendum)
Blood pressure looks good Stay on current dose of amlodipine  Eat well / exercise   Will see you in June

## 2015-02-10 ENCOUNTER — Encounter: Payer: Self-pay | Admitting: Family Medicine

## 2015-02-10 ENCOUNTER — Ambulatory Visit (INDEPENDENT_AMBULATORY_CARE_PROVIDER_SITE_OTHER): Payer: Managed Care, Other (non HMO) | Admitting: Family Medicine

## 2015-02-10 VITALS — BP 139/80 | HR 85 | Temp 98.4°F | Ht 65.5 in | Wt 145.2 lb

## 2015-02-10 DIAGNOSIS — B9789 Other viral agents as the cause of diseases classified elsewhere: Principal | ICD-10-CM

## 2015-02-10 DIAGNOSIS — J069 Acute upper respiratory infection, unspecified: Secondary | ICD-10-CM | POA: Insufficient documentation

## 2015-02-10 MED ORDER — BENZONATATE 200 MG PO CAPS: 200.0000 mg | ORAL_CAPSULE | Freq: Three times a day (TID) | ORAL | Status: DC | PRN

## 2015-02-10 NOTE — Progress Notes (Signed)
Subjective:    Patient ID: Denise Grant, female    DOB: 1955-08-20, 60 y.o.   MRN: [id number]  HPI Here with uri symptoms since Saturday  Throat pain -raw/not severe   Ears are full/pressure and popping   Nose stuffy - little yellow/mostly clear  Head hurts all over  Sinus pressure   Coughing a lot- prod of mucous today - light yellow  No fever  Some chills   [facility] -chest congestion and cough med helps a bit   Patient Active Problem List   Diagnosis Date Noted  . Viral URI with cough 02/10/2015  . Essential hypertension 01/01/2015  . Elevated TSH 06/25/2014  . Migraine with vertigo 05/21/2014  . Sinusitis, acute 05/21/2014  . History of Chiari malformation 05/21/2014  . Spasm of back muscles 10/02/2013  . Atrophic vaginitis 05/01/2013  . Microhematuria 04/15/2013  . Colon cancer screening 04/20/2012  . Encounter for routine gynecological examination 11/24/2010  . Grief 11/24/2010  . Routine general medical examination at a health care facility 11/18/2010  . INSOMNIA 01/27/2010  . CONTACT DERMATITIS-NOS 03/31/2008  . HEADACHE 03/31/2008  . Disorder of bone and cartilage 09/18/2007  . Generalized anxiety disorder 05/05/2006  . MITRAL VALVE PROLAPSE 05/05/2006  . ALLERGIC RHINITIS 05/05/2006  . FIBROCYSTIC BREAST DISEASE 05/05/2006  . SEBORRHEIC DERMATITIS 05/05/2006  . KERATOSIS, ACTINIC 05/05/2006  . NECK PAIN, CHRONIC 05/05/2006   Past Medical History  Diagnosis Date  . Allergic rhinitis   . Anxiety   . Osteopenia   . DDD (degenerative disc disease)   . Lactose intolerance   . Vaginal cyst     stable  . Diffuse cystic mastopathy   . Headache(784.0)   . Insomnia, unspecified   . Actinic keratosis   . Family history of other specified malignant neoplasm   . Mitral valve disorders   . Seborrheic dermatitis, unspecified   . Chiari malformation    Past Surgical History  Procedure Laterality Date  . Tubal ligation    . Lumbar disc surgery    . Chiari  malformation surgery  6/03  . Excision morton's neuroma  2/04  . Bone spur removal  10/06    shoulder  . Cervical disc surgery  2005   Social History  Substance Use Topics  . Smoking status: Former Smoker    Quit date: 01/17/2001  . Smokeless tobacco: Never Used  . Alcohol Use: No   Family History  Problem Relation Age of Onset  . Melanoma Sister   . Lung cancer Father   . Coronary artery disease Mother   . Heart failure Mother   . Hypertension Mother   . Anxiety disorder Son   . Lung cancer Brother     + smoker   Allergies  Allergen Reactions  . Calcium     REACTION: cannot tolerate any calcium supplement - GI sympotms  . Fluticasone Propionate     REACTION: headache/irritation  . Hctz [Hydrochlorothiazide] Other (See Comments)    Eye redness and discomfort/ slt blurred vision   . Paroxetine     REACTION: abdominal pain and constipation   Current Outpatient Prescriptions on File Prior to Visit  Medication Sig Dispense Refill  . amLODipine (NORVASC) 5 MG tablet Take 1 tablet (5 mg total) by mouth daily. 30 tablet 11  . Chlorphen-Phenyleph-Ibuprofen (ADVIL ALLERGY & CONGESTION PO) Take by mouth as needed.    . cyclobenzaprine (FLEXERIL) 10 MG tablet Take 0.5-1 tablets (5-10 mg total) by mouth 3 (three) times daily  as needed for muscle spasms. 30 tablet 1  . LORazepam (ATIVAN) 1 MG tablet TAKE ONE-HALF TABLET BY MOUTH AT BEDTIME AS NEEDED 30 tablet 1  . meclizine (ANTIVERT) 25 MG tablet Take 1 tablet (25 mg total) by mouth 3 (three) times daily as needed for dizziness. 15 tablet 0   No current facility-administered medications on file prior to visit.    Review of Systems  Constitutional: Positive for appetite change and fatigue. Negative for fever.  HENT: Positive for congestion, postnasal drip, rhinorrhea, sinus pressure, sneezing and sore throat. Negative for ear pain.   Eyes: Negative for pain and discharge.  Respiratory: Positive for cough. Negative for shortness of  breath, wheezing and stridor.   Cardiovascular: Negative for chest pain.  Gastrointestinal: Negative for nausea, vomiting and diarrhea.  Genitourinary: Negative for urgency, frequency and hematuria.  Musculoskeletal: Negative for myalgias and arthralgias.  Skin: Negative for rash.  Neurological: Positive for headaches. Negative for dizziness, weakness and light-headedness.  Psychiatric/Behavioral: Negative for confusion and dysphoric mood.       Objective:   Physical Exam  Constitutional: She appears well-developed and well-nourished. No distress.  Well appearing but fatigued   HENT:  Head: Normocephalic and atraumatic.  Right Ear: External ear normal.  Left Ear: External ear normal.  Mouth/Throat: Oropharynx is clear and moist.  Nares are injected and congested  No sinus tenderness Clear rhinorrhea and post nasal drip   Eyes: Conjunctivae and EOM are normal. Pupils are equal, round, and reactive to light. Right eye exhibits no discharge. Left eye exhibits no discharge.  Neck: Normal range of motion. Neck supple.  Cardiovascular: Normal rate and normal heart sounds.   Pulmonary/Chest: Effort normal and breath sounds normal. No respiratory distress. She has no wheezes. She has no rales. She exhibits no tenderness.  Lymphadenopathy:    She has no cervical adenopathy.  Neurological: She is alert.  Skin: Skin is warm and dry. No rash noted.  Psychiatric: She has a normal mood and affect.          Assessment & Plan:   Problem List Items Addressed This Visit      Respiratory   Viral URI with cough - Primary    Reassuring exam Handout given on viral uri  Note for work -out today and tomorrow Fluids/rest Disc symptomatic care - see instructions on AVS Update if not starting to improve in a week or if worsening   Px for tessalon for cough prn

## 2015-02-10 NOTE — Progress Notes (Signed)
Pre visit review using our clinic review tool, if applicable. No additional management support is needed unless otherwise documented below in the visit note. 

## 2015-02-10 NOTE — Patient Instructions (Addendum)
You have a viral upper respiratory infection  Drink lots of water Rest when you can  Breathe steam  Try nasal saline spray or netti pot to help irrigate sinuses  Warm compresses on face help too  Ibuprofen or aleve over the counter with food as needed for headache/fever - also helps with congestion  Try flonase nasal spray otc daily for 1-2 weeks to also help congestion also  mucinex DM for cough - loosens and suppress cough In addition-try tessalon   If you develop sinus pain / green nasal discharge and fever - let me know   Update if not starting to improve in a week or if worsening

## 2015-02-10 NOTE — Assessment & Plan Note (Signed)
Reassuring exam Handout given on viral uri  Note for work -out today and tomorrow Fluids/rest Disc symptomatic care - see instructions on AVS Update if not starting to improve in a week or if worsening   Px for tessalon for cough prn

## 2015-02-18 ENCOUNTER — Encounter: Payer: Self-pay | Admitting: Family Medicine

## 2015-03-10 ENCOUNTER — Other Ambulatory Visit: Payer: Self-pay | Admitting: *Deleted

## 2015-03-10 MED ORDER — AMLODIPINE BESYLATE 5 MG PO TABS: 5.0000 mg | ORAL_TABLET | Freq: Every day | ORAL | Status: DC

## 2015-03-10 NOTE — Telephone Encounter (Signed)
Received fax requesting 90 day supply of Rx, done

## 2015-04-06 ENCOUNTER — Other Ambulatory Visit: Payer: Self-pay | Admitting: *Deleted

## 2015-06-01 ENCOUNTER — Other Ambulatory Visit: Payer: Self-pay | Admitting: Family Medicine

## 2015-06-01 DIAGNOSIS — Z1231 Encounter for screening mammogram for malignant neoplasm of breast: Secondary | ICD-10-CM

## 2015-06-16 ENCOUNTER — Other Ambulatory Visit: Payer: Self-pay | Admitting: Family Medicine

## 2015-06-16 ENCOUNTER — Ambulatory Visit
Admission: RE | Admit: 2015-06-16 | Discharge: 2015-06-16 | Disposition: A | Discharge status: Home / Self Care | Payer: Managed Care, Other (non HMO) | Source: Ambulatory Visit | Attending: Family Medicine | Admitting: Family Medicine

## 2015-06-16 DIAGNOSIS — Z1231 Encounter for screening mammogram for malignant neoplasm of breast: Secondary | ICD-10-CM

## 2015-06-16 LAB — HM MAMMOGRAPHY

## 2015-06-16 IMAGING — MG MM DIGITAL SCREENING BILAT W/ CAD
4 series · 4 of 4 positions shown · non-contrast
Comparison: Previous exam(s).

CLINICAL DATA: Screening.

EXAM:
DIGITAL SCREENING BILATERAL MAMMOGRAM WITH CAD

[R MLO]
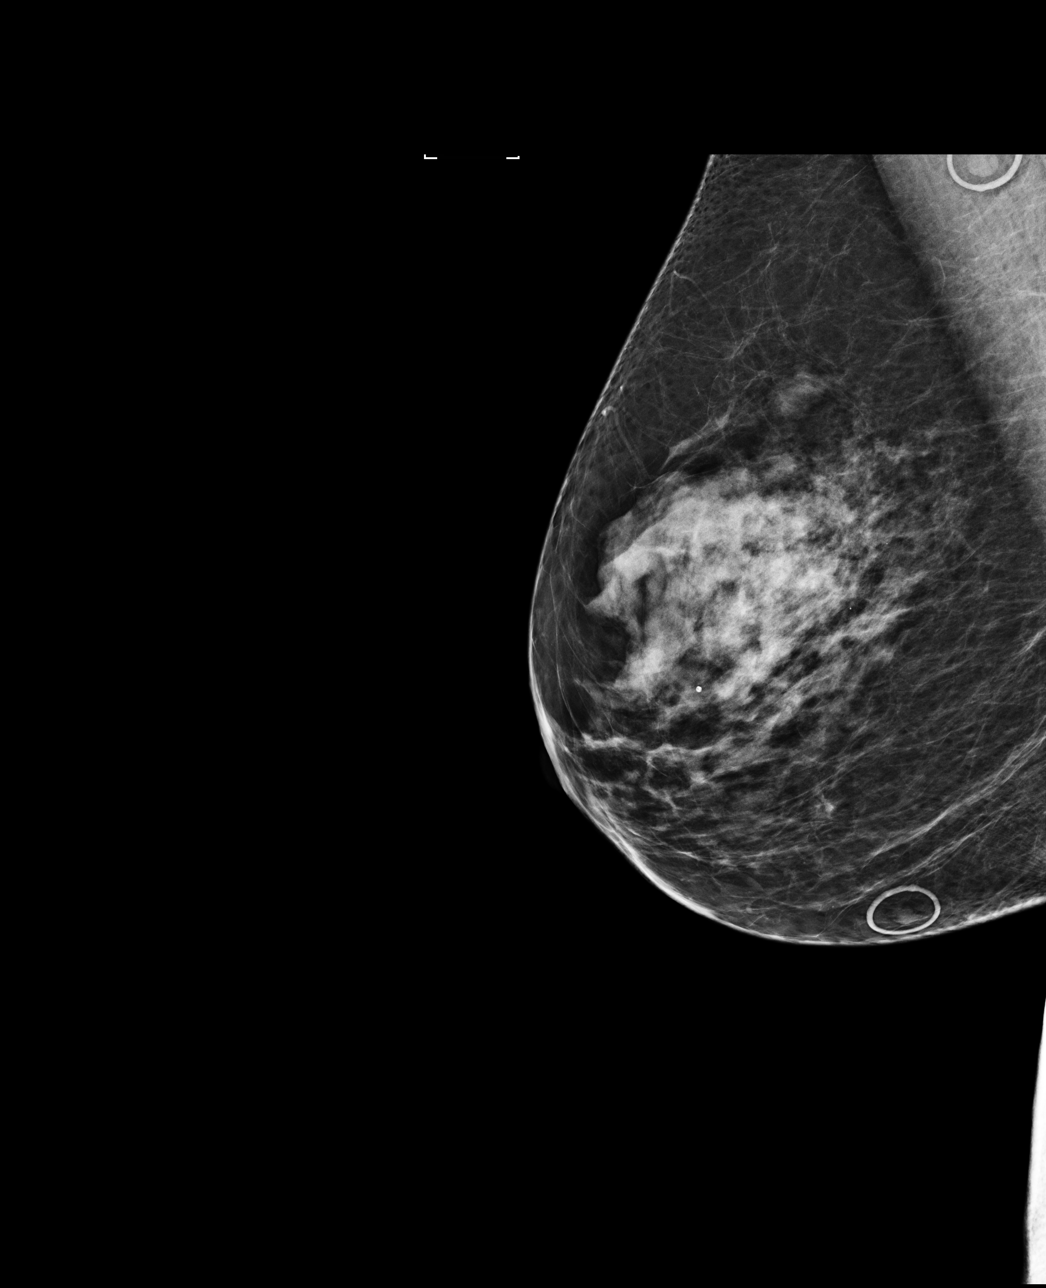

[L CC]
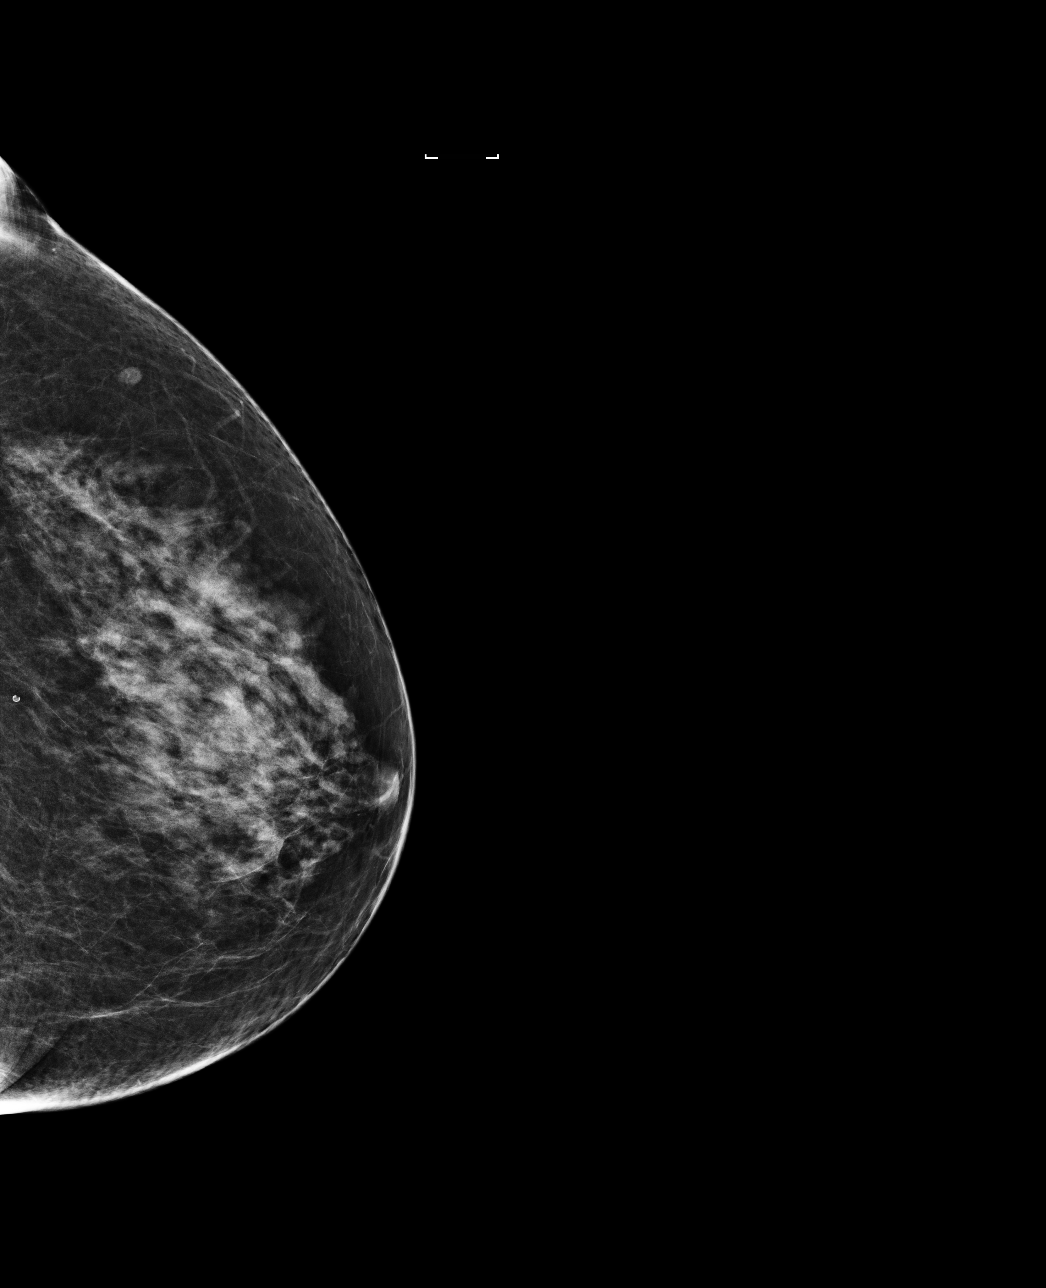

[L MLO]
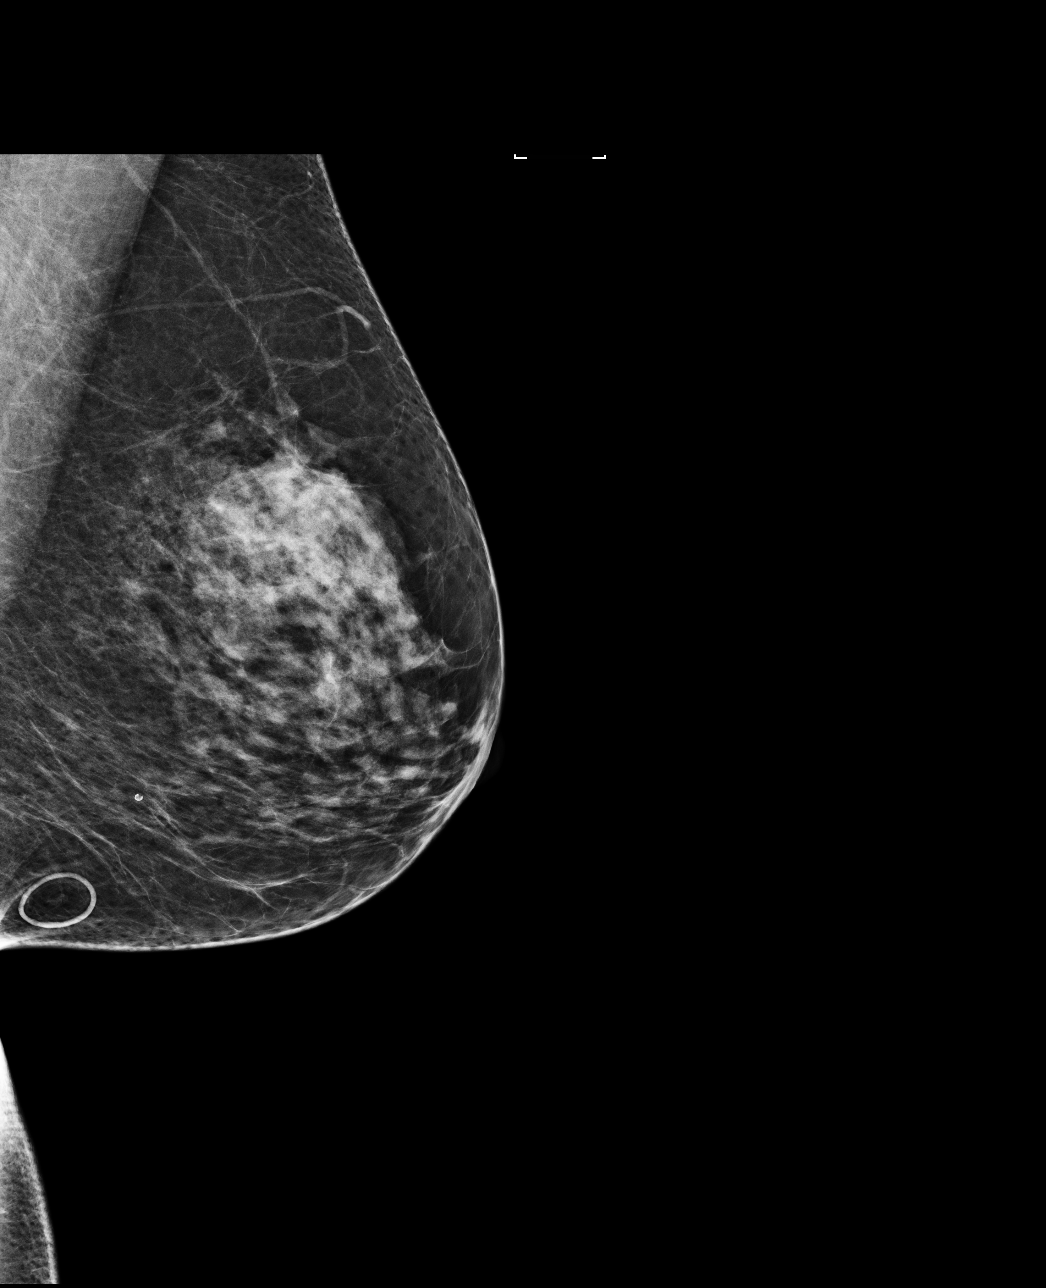

[R CC]
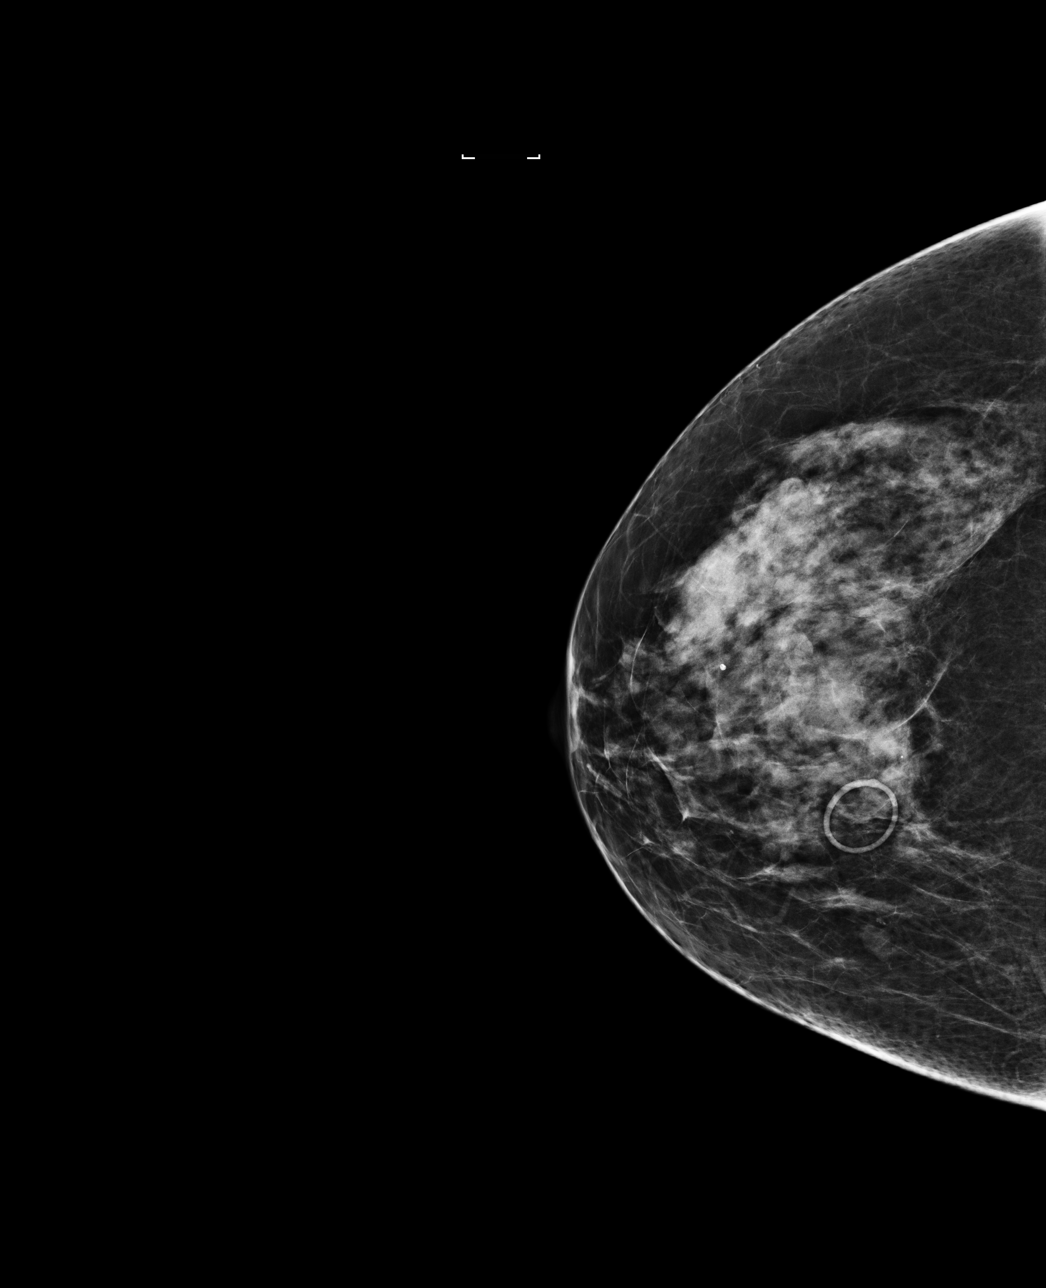

[4 of 4 positions shown; findings below may reference images not displayed]

ACR Breast Density Category c: The breast tissue is heterogeneously
dense, which may obscure small masses.
FINDINGS: There are no findings suspicious for malignancy. Images were
processed with CAD.
IMPRESSION: No mammographic evidence of malignancy. A result letter of this
screening mammogram will be mailed directly to the patient.

RECOMMENDATION:
Screening mammogram in one year. (Code:[0J])

BI-RADS CATEGORY  1: Negative.

## 2015-06-16 NOTE — Telephone Encounter (Signed)
Last filled on 12/08/14 #30 with 1 additional refill, pt has CPE scheduled on 07/22/15, please advise

## 2015-06-16 NOTE — Telephone Encounter (Signed)
Px written for call in   

## 2015-06-16 NOTE — Telephone Encounter (Signed)
Rx called in as prescribed 

## 2015-06-18 ENCOUNTER — Encounter: Payer: Self-pay | Admitting: *Deleted

## 2015-06-25 ENCOUNTER — Other Ambulatory Visit: Payer: Managed Care, Other (non HMO)

## 2015-06-29 ENCOUNTER — Encounter: Payer: Managed Care, Other (non HMO) | Admitting: Family Medicine

## 2015-07-11 ENCOUNTER — Telehealth: Payer: Self-pay | Admitting: Family Medicine

## 2015-07-11 DIAGNOSIS — Z0000 Encounter for general adult medical examination without abnormal findings: Secondary | ICD-10-CM

## 2015-07-11 NOTE — Telephone Encounter (Signed)
-----   Message from Marchia Bond sent at 07/10/2015  1:39 PM EDT ----- Regarding: Cpx labs Fri 6/30, need orders. Thanks! :-) Please order  future cpx labs for pt's upcoming lab appt. Thanks Aniceto Boss

## 2015-07-17 ENCOUNTER — Other Ambulatory Visit (INDEPENDENT_AMBULATORY_CARE_PROVIDER_SITE_OTHER): Payer: Managed Care, Other (non HMO)

## 2015-07-17 DIAGNOSIS — Z0000 Encounter for general adult medical examination without abnormal findings: Secondary | ICD-10-CM | POA: Diagnosis not present

## 2015-07-17 LAB — LIPID PANEL
CHOLESTEROL: 165 mg/dL (ref 0–200)
HDL: 61.9 mg/dL (ref >39.00)
LDL CALC: 92 mg/dL (ref 0–99)
NonHDL: 103.16
TRIGLYCERIDES: 58 mg/dL (ref 0.0–149.0)
Total CHOL/HDL Ratio: 3
VLDL: 11.6 mg/dL (ref 0.0–40.0)

## 2015-07-17 LAB — COMPREHENSIVE METABOLIC PANEL
ALBUMIN: 4.5 g/dL (ref 3.5–5.2)
ALK PHOS: 55 U/L (ref 39–117)
ALT: 23 U/L (ref 0–35)
AST: 30 U/L (ref 0–37)
BILIRUBIN TOTAL: 0.8 mg/dL (ref 0.2–1.2)
BUN: 12 mg/dL (ref 6–23)
CALCIUM: 9.9 mg/dL (ref 8.4–10.5)
CHLORIDE: 105 meq/L (ref 96–112)
CO2: 30 mEq/L (ref 19–32)
CREATININE: 0.84 mg/dL (ref 0.40–1.20)
GFR: 73.18 mL/min (ref >60.00)
Glucose, Bld: 98 mg/dL (ref 70–99)
Potassium: 4.7 mEq/L (ref 3.5–5.1)
Sodium: 139 mEq/L (ref 135–145)
TOTAL PROTEIN: 7.2 g/dL (ref 6.0–8.3)

## 2015-07-17 LAB — CBC WITH DIFFERENTIAL/PLATELET
BASOS PCT: 0.5 % (ref 0.0–3.0)
Basophils Absolute: 0 10*3/uL (ref 0.0–0.1)
EOS ABS: 0 10*3/uL (ref 0.0–0.7)
EOS PCT: 0.8 % (ref 0.0–5.0)
HEMATOCRIT: 40 % (ref 36.0–46.0)
HEMOGLOBIN: 13.6 g/dL (ref 12.0–15.0)
LYMPHS PCT: 24.9 % (ref 12.0–46.0)
Lymphs Abs: 1.1 10*3/uL (ref 0.7–4.0)
MCHC: 34 g/dL (ref 30.0–36.0)
MCV: 89.8 fl (ref 78.0–100.0)
Monocytes Absolute: 0.3 10*3/uL (ref 0.1–1.0)
Monocytes Relative: 6.3 % (ref 3.0–12.0)
Neutro Abs: 2.9 10*3/uL (ref 1.4–7.7)
Neutrophils Relative %: 67.5 % (ref 43.0–77.0)
Platelets: 183 10*3/uL (ref 150.0–400.0)
RBC: 4.45 Mil/uL (ref 3.87–5.11)
RDW: 13.8 % (ref 11.5–15.5)
WBC: 4.3 10*3/uL (ref 4.0–10.5)

## 2015-07-17 LAB — TSH: TSH: 4.03 u[IU]/mL (ref 0.35–4.50)

## 2015-07-22 ENCOUNTER — Encounter: Payer: Self-pay | Admitting: Family Medicine

## 2015-07-22 ENCOUNTER — Ambulatory Visit (INDEPENDENT_AMBULATORY_CARE_PROVIDER_SITE_OTHER): Payer: Managed Care, Other (non HMO) | Admitting: Family Medicine

## 2015-07-22 VITALS — BP 130/70 | HR 95 | Temp 98.0°F | Ht 65.5 in | Wt 147.8 lb

## 2015-07-22 DIAGNOSIS — I10 Essential (primary) hypertension: Secondary | ICD-10-CM | POA: Diagnosis not present

## 2015-07-22 DIAGNOSIS — Z0000 Encounter for general adult medical examination without abnormal findings: Secondary | ICD-10-CM

## 2015-07-22 DIAGNOSIS — R3129 Other microscopic hematuria: Secondary | ICD-10-CM | POA: Diagnosis not present

## 2015-07-22 DIAGNOSIS — Z0001 Encounter for general adult medical examination with abnormal findings: Secondary | ICD-10-CM | POA: Diagnosis not present

## 2015-07-22 DIAGNOSIS — M899 Disorder of bone, unspecified: Secondary | ICD-10-CM

## 2015-07-22 DIAGNOSIS — M949 Disorder of cartilage, unspecified: Secondary | ICD-10-CM

## 2015-07-22 DIAGNOSIS — Z1211 Encounter for screening for malignant neoplasm of colon: Secondary | ICD-10-CM

## 2015-07-22 DIAGNOSIS — F411 Generalized anxiety disorder: Secondary | ICD-10-CM

## 2015-07-22 LAB — POC URINALSYSI DIPSTICK (AUTOMATED)
Leukocytes, UA: NEGATIVE
PH UA: 6
SPEC GRAV UA: 1.015
Urobilinogen, UA: NEGATIVE

## 2015-07-22 MED ORDER — BUSPIRONE HCL 15 MG PO TABS: 7.5000 mg | ORAL_TABLET | Freq: Two times a day (BID) | ORAL | Status: DC

## 2015-07-22 NOTE — Progress Notes (Signed)
Subjective:    Patient ID: Denise Grant, female    DOB: 05-04-1955, 60 y.o.   MRN: [id number]  HPI Here for health maintenance exam and to review chronic medical problems    More anxiety lately - it comes and goes (has to take 1/2 lorazepam at times)  Stressors- eye problems/ a lot of floaters/may have to see a retinal specialist   Would like to try buspar    Had uti in 2015-no further symptoms after cipro  Wants to re check ua for blood  Results for orders placed or performed in visit on 07/22/15  POCT Urinalysis Dipstick (Automated)  Result Value Ref Range   Color, UA yellow    Clarity, UA clear    Glucose, UA -    Bilirubin, UA -    Ketones, UA -    Spec Grav, UA 1.015    Blood, UA -    pH, UA 6.0    Protein, UA -    Urobilinogen, UA negative    Nitrite, UA -    Leukocytes, UA Negative Negative      Wt is up 2 lb with bmi of 24 She is eating well and getting exercise  Less walking due to heat/ plays golf occasionally    IFOB done 6/16 (declines colonoscopy)- was negative  Not interested in colonoscopy  Open to ifob  Will see if ins covers   Flu shot 10/16  Pap 4/15-negative No gyn problems  Post menopausal   Mammogram 5/17-negative  Self breast exam -no lumps or chenges    Tetanus shot 4/14  dexa 5/14-osteopenia  Does not want another dexa yet No fractures  No falls  Not taking calcium and vitamin D - calcium bothers her stomach  Eating yogurt   bp is stable today  No cp or palpitations or headaches or edema  No side effects to medicines  BP Readings from Last 3 Encounters:  07/22/15 140/80  02/10/15 139/80  01/28/15 130/70     Takes amlodipine 5 mg daily   Results for orders placed or performed in visit on 07/17/15  CBC with Differential/Platelet  Result Value Ref Range   WBC 4.3 4.0 - 10.5 K/uL   RBC 4.45 3.87 - 5.11 Mil/uL   Hemoglobin 13.6 12.0 - 15.0 g/dL   HCT 72.8 25.0 - 31.1 %   MCV 89.8 78.0 - 100.0 fl   MCHC 34.0 30.0 -  36.0 g/dL   RDW 74.3 43.6 - 20.5 %   Platelets 183.0 150.0 - 400.0 K/uL   Neutrophils Relative % 67.5 43.0 - 77.0 %   Lymphocytes Relative 24.9 12.0 - 46.0 %   Monocytes Relative 6.3 3.0 - 12.0 %   Eosinophils Relative 0.8 0.0 - 5.0 %   Basophils Relative 0.5 0.0 - 3.0 %   Neutro Abs 2.9 1.4 - 7.7 K/uL   Lymphs Abs 1.1 0.7 - 4.0 K/uL   Monocytes Absolute 0.3 0.1 - 1.0 K/uL   Eosinophils Absolute 0.0 0.0 - 0.7 K/uL   Basophils Absolute 0.0 0.0 - 0.1 K/uL  Comprehensive metabolic panel  Result Value Ref Range   Sodium 139 135 - 145 mEq/L   Potassium 4.7 3.5 - 5.1 mEq/L   Chloride 105 96 - 112 mEq/L   CO2 30 19 - 32 mEq/L   Glucose, Bld 98 70 - 99 mg/dL   BUN 12 6 - 23 mg/dL   Creatinine, Ser 8.59 0.40 - 1.20 mg/dL   Total Bilirubin 0.8 0.2 -  1.2 mg/dL   Alkaline Phosphatase 55 39 - 117 U/L   AST 30 0 - 37 U/L   ALT 23 0 - 35 U/L   Total Protein 7.2 6.0 - 8.3 g/dL   Albumin 4.5 3.5 - 5.2 g/dL   Calcium 9.9 8.4 - 10.5 mg/dL   GFR 73.18 >60.00 mL/min  Lipid panel  Result Value Ref Range   Cholesterol 165 0 - 200 mg/dL   Triglycerides 58.0 0.0 - 149.0 mg/dL   HDL 61.90 >39.00 mg/dL   VLDL 11.6 0.0 - 40.0 mg/dL   LDL Cholesterol 92 0 - 99 mg/dL   Total CHOL/HDL Ratio 3    NonHDL 103.16   TSH  Result Value Ref Range   TSH 4.03 0.35 - 4.50 uIU/mL     Good labs! Good cholesterol !   Patient Active Problem List   Diagnosis Date Noted  . Essential hypertension 01/01/2015  . Migraine with vertigo 05/21/2014  . History of Chiari malformation 05/21/2014  . Spasm of back muscles 10/02/2013  . Atrophic vaginitis 05/01/2013  . Microhematuria 04/15/2013  . Colon cancer screening 04/20/2012  . Encounter for routine gynecological examination 11/24/2010  . Grief 11/24/2010  . Routine general medical examination at a health care facility 11/18/2010  . INSOMNIA 01/27/2010  . CONTACT DERMATITIS-NOS 03/31/2008  . HEADACHE 03/31/2008  . Disorder of bone and cartilage 09/18/2007  .  Generalized anxiety disorder 05/05/2006  . MITRAL VALVE PROLAPSE 05/05/2006  . ALLERGIC RHINITIS 05/05/2006  . FIBROCYSTIC BREAST DISEASE 05/05/2006  . SEBORRHEIC DERMATITIS 05/05/2006  . KERATOSIS, ACTINIC 05/05/2006  . NECK PAIN, CHRONIC 05/05/2006   Past Medical History  Diagnosis Date  . Allergic rhinitis   . Anxiety   . Osteopenia   . DDD (degenerative disc disease)   . Lactose intolerance   . Vaginal cyst     stable  . Diffuse cystic mastopathy   . Headache(784.0)   . Insomnia, unspecified   . Actinic keratosis   . Family history of other specified malignant neoplasm   . Mitral valve disorders   . Seborrheic dermatitis, unspecified   . Chiari malformation    Past Surgical History  Procedure Laterality Date  . Tubal ligation    . Lumbar disc surgery    . Chiari malformation surgery  6/03  . Excision morton's neuroma  2/04  . Bone spur removal  10/06    shoulder  . Cervical disc surgery  2005   Social History  Substance Use Topics  . Smoking status: Former Smoker    Quit date: 01/17/2001  . Smokeless tobacco: Never Used  . Alcohol Use: No   Family History  Problem Relation Age of Onset  . Melanoma Sister   . Lung cancer Father   . Coronary artery disease Mother   . Heart failure Mother   . Hypertension Mother   . Anxiety disorder Son   . Lung cancer Brother     + smoker   Allergies  Allergen Reactions  . Calcium     REACTION: cannot tolerate any calcium supplement - GI sympotms  . Fluticasone Propionate     REACTION: headache/irritation  . Hctz [Hydrochlorothiazide] Other (See Comments)    Eye redness and discomfort/ slt blurred vision   . Paroxetine     REACTION: abdominal pain and constipation   Current Outpatient Prescriptions on File Prior to Visit  Medication Sig Dispense Refill  . amLODipine (NORVASC) 5 MG tablet Take 1 tablet (5 mg total) by mouth daily.  90 tablet 1  . Chlorphen-Phenyleph-Ibuprofen (ADVIL ALLERGY & CONGESTION PO) Take by  mouth as needed.    . cyclobenzaprine (FLEXERIL) 10 MG tablet Take 0.5-1 tablets (5-10 mg total) by mouth 3 (three) times daily as needed for muscle spasms. 30 tablet 1  . LORazepam (ATIVAN) 1 MG tablet TAKE ONE-HALF TABLET BY MOUTH AT BEDTIME AS NEEDED 30 tablet 1  . meclizine (ANTIVERT) 25 MG tablet Take 1 tablet (25 mg total) by mouth 3 (three) times daily as needed for dizziness. 15 tablet 0   No current facility-administered medications on file prior to visit.    Review of Systems Review of Systems  Constitutional: Negative for fever, appetite change, fatigue and unexpected weight change.  Eyes: Negative for pain and visual disturbance.  Respiratory: Negative for cough and shortness of breath.   Cardiovascular: Negative for cp or palpitations    Gastrointestinal: Negative for nausea, diarrhea and constipation.  Genitourinary: Negative for urgency and frequency.  Skin: Negative for pallor or rash   Neurological: Negative for weakness, light-headedness, numbness and headaches.  Hematological: Negative for adenopathy. Does not bruise/bleed easily.  Psychiatric/Behavioral: Negative for dysphoric mood. The patient has anxiety that is worse lately       Objective:   Physical Exam  Constitutional: She appears well-developed and well-nourished. No distress.  Well appearing   HENT:  Head: Normocephalic and atraumatic.  Right Ear: External ear normal.  Left Ear: External ear normal.  Mouth/Throat: Oropharynx is clear and moist.  Eyes: Conjunctivae and EOM are normal. Pupils are equal, round, and reactive to light. No scleral icterus.  Neck: Normal range of motion. Neck supple. No JVD present. Carotid bruit is not present. No thyromegaly present.  Cardiovascular: Normal rate, regular rhythm, normal heart sounds and intact distal pulses.  Exam reveals no gallop.   Few leg varicosities  Pulmonary/Chest: Effort normal and breath sounds normal. No respiratory distress. She has no wheezes.  She exhibits no tenderness.  Abdominal: Soft. Bowel sounds are normal. She exhibits no distension, no abdominal bruit and no mass. There is no tenderness.  Genitourinary: No breast swelling, tenderness, discharge or bleeding.  Breast exam: No mass, nodules, thickening, tenderness, bulging, retraction, inflamation, nipple discharge or skin changes noted.  No axillary or clavicular LA.      Musculoskeletal: Normal range of motion. She exhibits no edema or tenderness.  Lymphadenopathy:    She has no cervical adenopathy.  Neurological: She is alert. She has normal reflexes. No cranial nerve deficit. She exhibits normal muscle tone. Coordination normal.  Skin: Skin is warm and dry. No rash noted. No erythema. No pallor.  Some faint lentigines and skin tags  Psychiatric: She has a normal mood and affect.  Pleasant and talkative           Assessment & Plan:   Problem List Items Addressed This Visit      Cardiovascular and Mediastinum   Essential hypertension    bp in fair control at this time  BP Readings from Last 1 Encounters:  07/22/15 130/70   No changes needed Disc lifstyle change with low sodium diet and exercise  Labs reviewed  Enc daily exercise         Musculoskeletal and Integument   Disorder of bone and cartilage    dexa 2015 Declines another right now No falls or fx Does exercise  Disc need for calcium/ vitamin D/ wt bearing exercise and bone density test every 2 y to monitor Disc safety/ fracture risk in detail  Genitourinary   Microhematuria    Re check ua today        Other   Routine general medical examination at a health care facility - Primary    Reviewed health habits including diet and exercise and skin cancer prevention Reviewed appropriate screening tests for age  Also reviewed health mt list, fam hx and immunization status , as well as social and family history   See HPI Labs reviewed  Pt declined dexa- did enc her to start vit  D Declines colonoscopy- given ifob kit and info on cologuard  ua today -hx of microhematuria       Generalized anxiety disorder    This is worse lately  Reviewed stressors/ coping techniques/symptoms/ support sources/ tx options and side effects in detail today  Would like to try buspar Also recommend daily exercise (in or outdoor)-consider yoga for non walking days Discussed expectations of SSRI medication including time to effectiveness and mechanism of action, also poss of side effects (early and late)- including mental fuzziness, weight or appetite change, nausea and poss of worse dep or anxiety (even suicidal thoughts)  Pt voiced understanding and will stop med and update if this occurs   Has lorazepam for breakthrough symptoms as well       Colon cancer screening    Ordered ifob kit Pt may be interested in cologuard for the future- she will look into cost and coverage Still declines colonoscopy      Relevant Orders   Fecal occult blood, imunochemical

## 2015-07-22 NOTE — Assessment & Plan Note (Signed)
Reviewed health habits including diet and exercise and skin cancer prevention Reviewed appropriate screening tests for age  Also reviewed health mt list, fam hx and immunization status , as well as social and family history   See HPI Labs reviewed  Pt declined dexa- did enc her to start vit D Declines colonoscopy- given ifob kit and info on cologuard  ua today -hx of microhematuria

## 2015-07-22 NOTE — Assessment & Plan Note (Signed)
This is worse lately  Reviewed stressors/ coping techniques/symptoms/ support sources/ tx options and side effects in detail today  Would like to try buspar Also recommend daily exercise (in or outdoor)-consider yoga for non walking days Discussed expectations of SSRI medication including time to effectiveness and mechanism of action, also poss of side effects (early and late)- including mental fuzziness, weight or appetite change, nausea and poss of worse dep or anxiety (even suicidal thoughts)  Pt voiced understanding and will stop med and update if this occurs   Has lorazepam for breakthrough symptoms as well

## 2015-07-22 NOTE — Assessment & Plan Note (Signed)
Ordered ifob kit Pt may be interested in cologuard for the future- she will look into cost and coverage Still declines colonoscopy

## 2015-07-22 NOTE — Assessment & Plan Note (Signed)
bp in fair control at this time  BP Readings from Last 1 Encounters:  07/22/15 130/70   No changes needed Disc lifstyle change with low sodium diet and exercise  Labs reviewed  Enc daily exercise

## 2015-07-22 NOTE — Assessment & Plan Note (Signed)
dexa 2015 Declines another right now No falls or fx Does exercise  Disc need for calcium/ vitamin D/ wt bearing exercise and bone density test every 2 y to monitor Disc safety/ fracture risk in detail

## 2015-07-22 NOTE — Patient Instructions (Signed)
Try buspar 1/2 pill twice daily for anxiety  Lorazepam as needed  Consider getting a yoga video - try to exercise every day Do the ifob card for colon cancer screening - consider cologuard for next year if insurance covers it  Leave a urine specimen on the way out  Take vitamin D 2000 iu daily over the counter -very important for bones

## 2015-07-22 NOTE — Assessment & Plan Note (Signed)
Recheck ua today °

## 2015-07-30 ENCOUNTER — Encounter: Payer: Self-pay | Admitting: *Deleted

## 2015-07-30 ENCOUNTER — Other Ambulatory Visit (INDEPENDENT_AMBULATORY_CARE_PROVIDER_SITE_OTHER): Payer: Managed Care, Other (non HMO)

## 2015-07-30 DIAGNOSIS — Z1211 Encounter for screening for malignant neoplasm of colon: Secondary | ICD-10-CM | POA: Diagnosis not present

## 2015-07-30 LAB — FECAL OCCULT BLOOD, GUAIAC: FECAL OCCULT BLD: NEGATIVE

## 2015-07-30 LAB — FECAL OCCULT BLOOD, IMMUNOCHEMICAL: FECAL OCCULT BLD: NEGATIVE

## 2015-11-02 ENCOUNTER — Other Ambulatory Visit: Payer: Self-pay | Admitting: Family Medicine

## 2015-11-30 ENCOUNTER — Other Ambulatory Visit: Payer: Self-pay | Admitting: Family Medicine

## 2015-11-30 NOTE — Telephone Encounter (Signed)
Px written for call in   

## 2015-11-30 NOTE — Telephone Encounter (Signed)
Pt had CPE on 07/22/15 and already has next years scheduled, last filled on 06/16/15 #30 with 1 additional refills, please advise

## 2015-11-30 NOTE — Telephone Encounter (Signed)
Rx called in as prescribed 

## 2016-05-23 ENCOUNTER — Other Ambulatory Visit: Payer: Self-pay | Admitting: Family Medicine

## 2016-05-23 DIAGNOSIS — Z1231 Encounter for screening mammogram for malignant neoplasm of breast: Secondary | ICD-10-CM

## 2016-06-16 ENCOUNTER — Ambulatory Visit
Admission: RE | Admit: 2016-06-16 | Discharge: 2016-06-16 | Disposition: A | Discharge status: Home / Self Care | Payer: Managed Care, Other (non HMO) | Source: Ambulatory Visit | Attending: Family Medicine | Admitting: Family Medicine

## 2016-06-16 DIAGNOSIS — Z1231 Encounter for screening mammogram for malignant neoplasm of breast: Secondary | ICD-10-CM | POA: Diagnosis present

## 2016-06-16 IMAGING — MG MM DIGITAL SCREENING BILAT W/ CAD
5 series · 5 of 5 positions shown · non-contrast
Comparison: Previous exam(s).

CLINICAL DATA: Screening.

EXAM:
DIGITAL SCREENING BILATERAL MAMMOGRAM WITH CAD

[L CC]
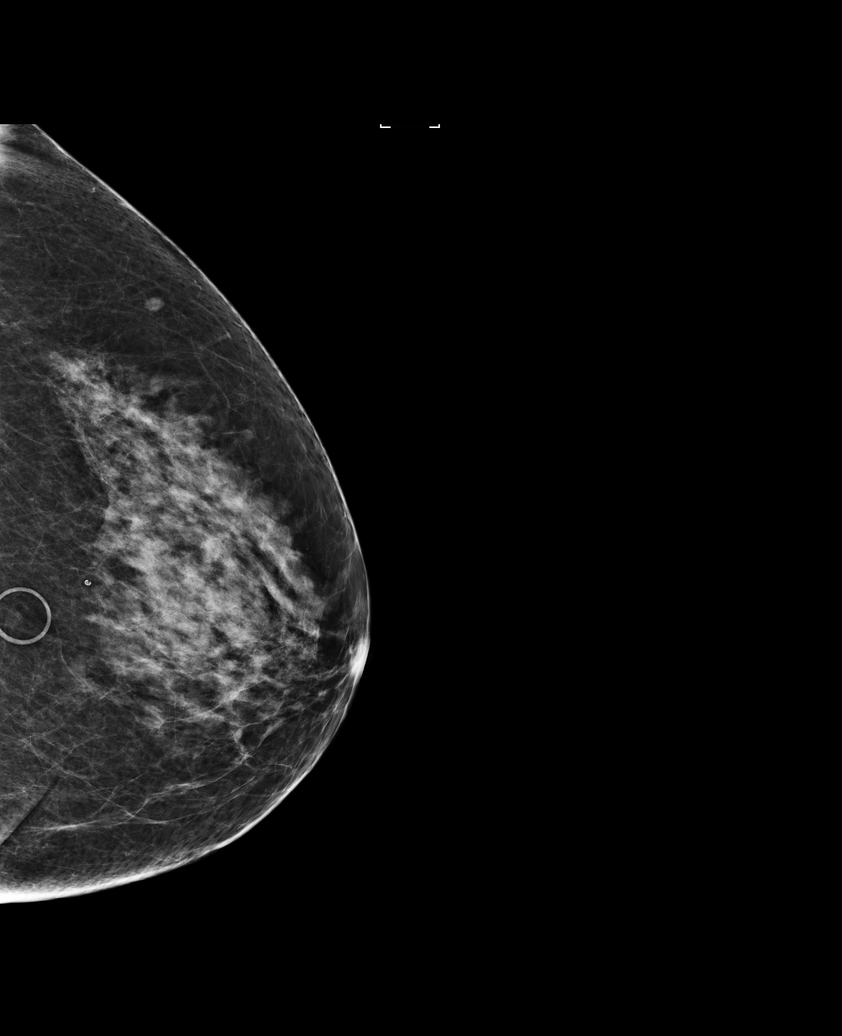

[R CC]
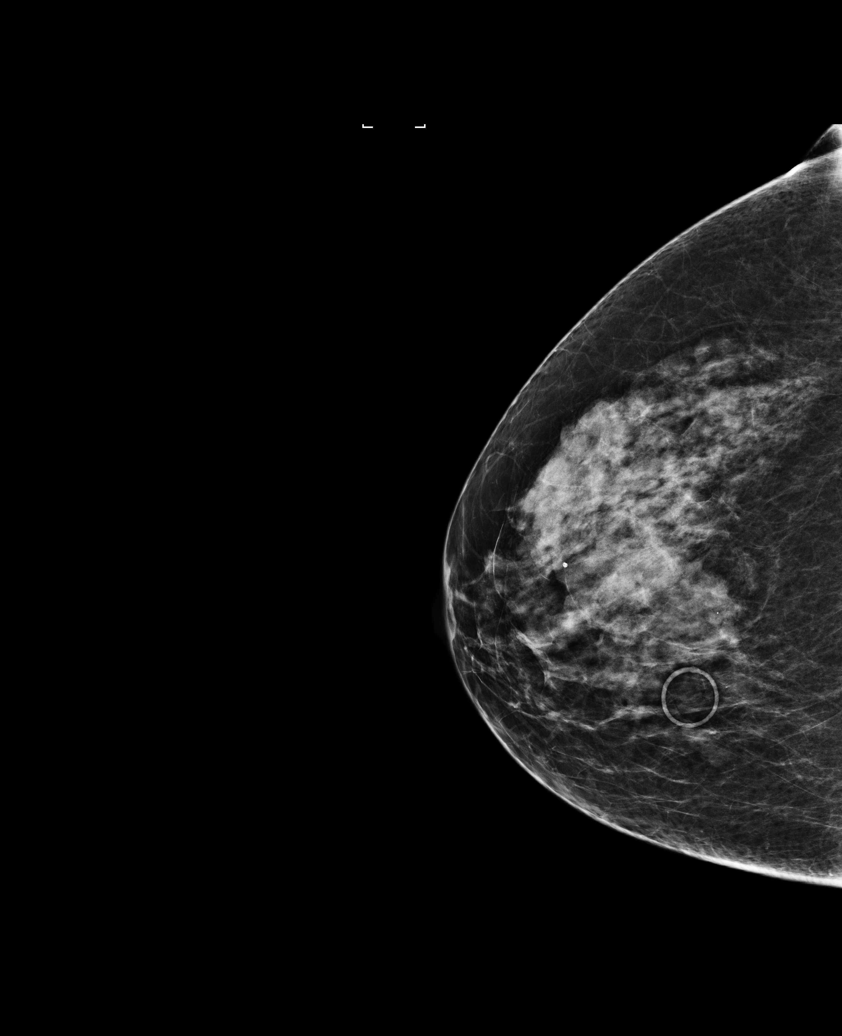

[R MLO]
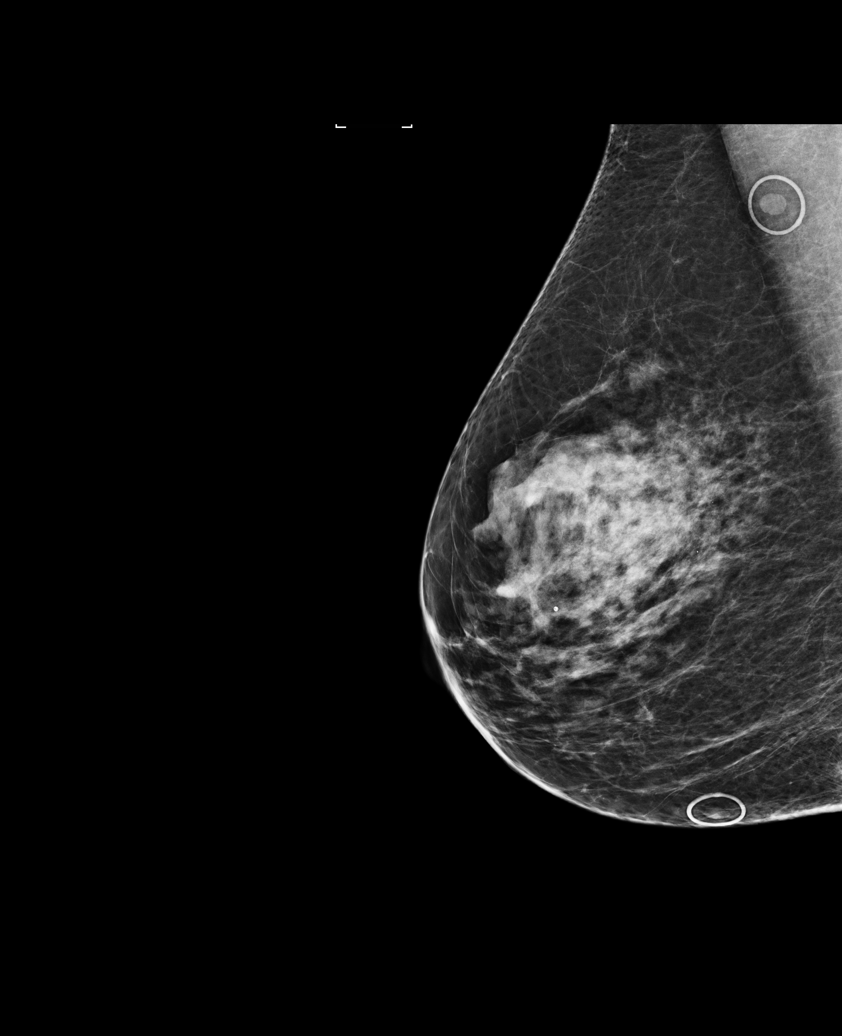

[L MLO]
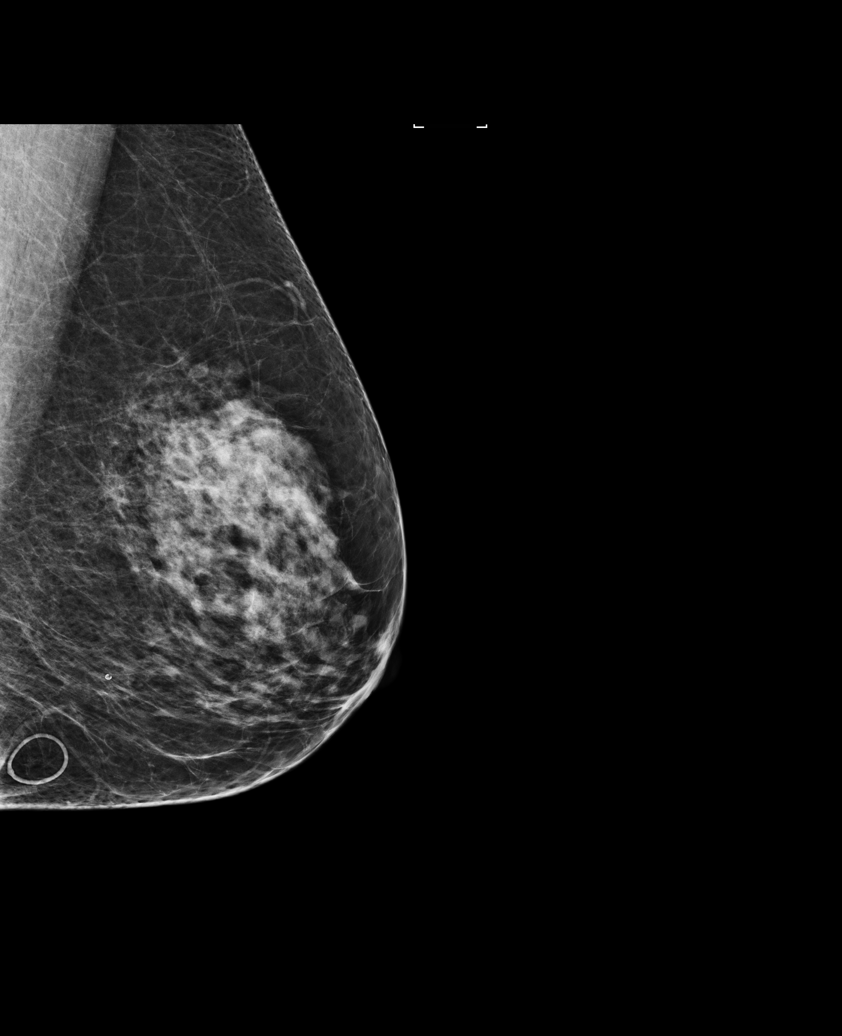

[L XCCM]
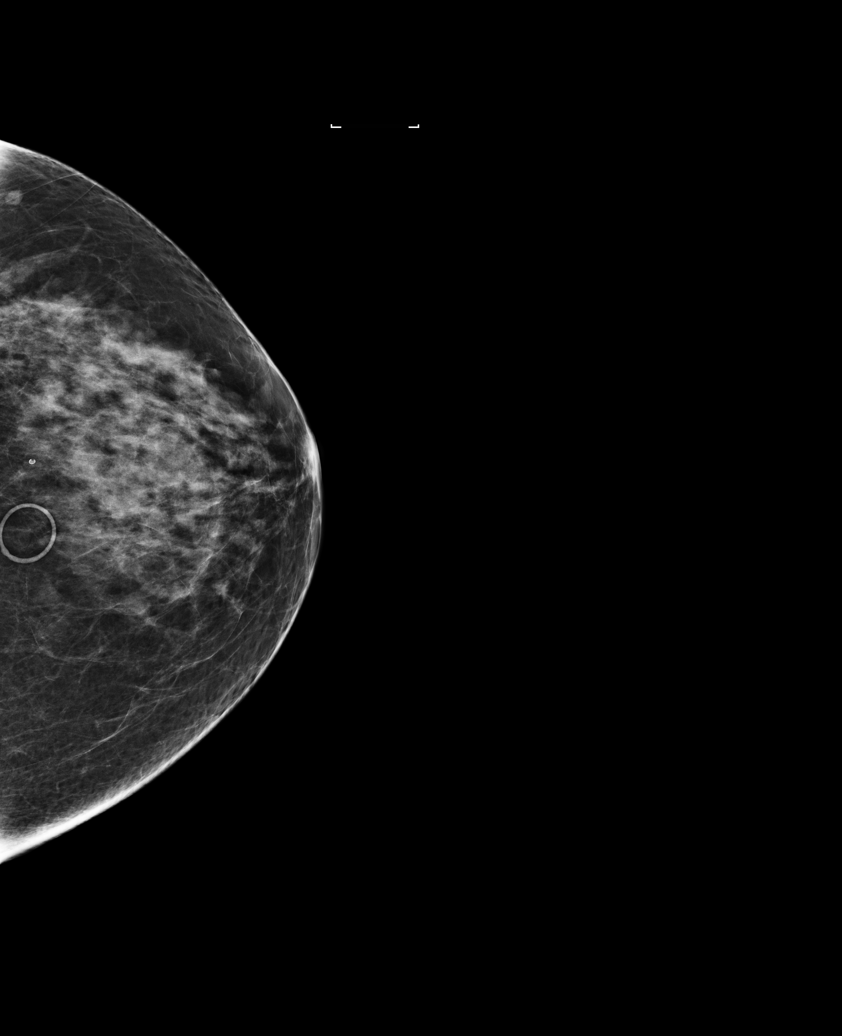

[5 of 5 positions shown; findings below may reference images not displayed]

ACR Breast Density Category c: The breast tissue is heterogeneously
dense, which may obscure small masses.
FINDINGS: There are no findings suspicious for malignancy. Images were
processed with CAD.
IMPRESSION: No mammographic evidence of malignancy. A result letter of this
screening mammogram will be mailed directly to the patient.

RECOMMENDATION:
Screening mammogram in one year. (Code:[0J])

BI-RADS CATEGORY  1: Negative.

## 2016-06-17 ENCOUNTER — Telehealth: Payer: Self-pay | Admitting: *Deleted

## 2016-06-17 ENCOUNTER — Encounter: Payer: Self-pay | Admitting: *Deleted

## 2016-06-17 NOTE — Telephone Encounter (Signed)
Opened in error

## 2016-07-14 ENCOUNTER — Telehealth: Payer: Self-pay | Admitting: Family Medicine

## 2016-07-14 DIAGNOSIS — M62830 Muscle spasm of back: Secondary | ICD-10-CM

## 2016-07-14 DIAGNOSIS — Z0000 Encounter for general adult medical examination without abnormal findings: Secondary | ICD-10-CM

## 2016-07-14 NOTE — Telephone Encounter (Signed)
-----   Message from Ellamae Sia sent at 07/13/2016  4:05 PM EDT ----- Regarding: Lab orders for Friday, 7.6.18 Patient is scheduled for CPX labs, please order future labs, Thanks , Karna Christmas

## 2016-07-22 ENCOUNTER — Other Ambulatory Visit (INDEPENDENT_AMBULATORY_CARE_PROVIDER_SITE_OTHER): Payer: 59

## 2016-07-22 DIAGNOSIS — Z0000 Encounter for general adult medical examination without abnormal findings: Secondary | ICD-10-CM

## 2016-07-22 LAB — CBC WITH DIFFERENTIAL/PLATELET
BASOS ABS: 0 10*3/uL (ref 0.0–0.1)
Basophils Relative: 0.6 % (ref 0.0–3.0)
Eosinophils Absolute: 0 10*3/uL (ref 0.0–0.7)
Eosinophils Relative: 1.1 % (ref 0.0–5.0)
HCT: 40.7 % (ref 36.0–46.0)
Hemoglobin: 14.1 g/dL (ref 12.0–15.0)
LYMPHS ABS: 1.1 10*3/uL (ref 0.7–4.0)
Lymphocytes Relative: 27.8 % (ref 12.0–46.0)
MCHC: 34.5 g/dL (ref 30.0–36.0)
MCV: 89 fl (ref 78.0–100.0)
MONO ABS: 0.4 10*3/uL (ref 0.1–1.0)
MONOS PCT: 9.1 % (ref 3.0–12.0)
NEUTROS ABS: 2.5 10*3/uL (ref 1.4–7.7)
NEUTROS PCT: 61.4 % (ref 43.0–77.0)
PLATELETS: 182 10*3/uL (ref 150.0–400.0)
RBC: 4.57 Mil/uL (ref 3.87–5.11)
RDW: 13.1 % (ref 11.5–15.5)
WBC: 4 10*3/uL (ref 4.0–10.5)

## 2016-07-22 LAB — COMPREHENSIVE METABOLIC PANEL
ALT: 20 U/L (ref 0–35)
AST: 26 U/L (ref 0–37)
Albumin: 4.4 g/dL (ref 3.5–5.2)
Alkaline Phosphatase: 60 U/L (ref 39–117)
BUN: 14 mg/dL (ref 6–23)
CO2: 30 meq/L (ref 19–32)
Calcium: 9.8 mg/dL (ref 8.4–10.5)
Chloride: 105 mEq/L (ref 96–112)
Creatinine, Ser: 0.87 mg/dL (ref 0.40–1.20)
GFR: 70.04 mL/min (ref >60.00)
GLUCOSE: 99 mg/dL (ref 70–99)
POTASSIUM: 4.7 meq/L (ref 3.5–5.1)
SODIUM: 140 meq/L (ref 135–145)
Total Bilirubin: 0.8 mg/dL (ref 0.2–1.2)
Total Protein: 7.2 g/dL (ref 6.0–8.3)

## 2016-07-22 LAB — LIPID PANEL
CHOL/HDL RATIO: 3
Cholesterol: 159 mg/dL (ref 0–200)
HDL: 57.2 mg/dL (ref >39.00)
LDL Cholesterol: 88 mg/dL (ref 0–99)
NONHDL: 102.09
Triglycerides: 68 mg/dL (ref 0.0–149.0)
VLDL: 13.6 mg/dL (ref 0.0–40.0)

## 2016-07-22 LAB — TSH: TSH: 4.5 u[IU]/mL (ref 0.35–4.50)

## 2016-07-25 ENCOUNTER — Other Ambulatory Visit: Payer: Self-pay | Admitting: Family Medicine

## 2016-07-29 ENCOUNTER — Other Ambulatory Visit (HOSPITAL_COMMUNITY)
Admission: RE | Admit: 2016-07-29 | Discharge: 2016-07-29 | Disposition: A | Discharge status: Home / Self Care | Payer: 59 | Source: Ambulatory Visit | Attending: Family Medicine | Admitting: Family Medicine

## 2016-07-29 ENCOUNTER — Ambulatory Visit (INDEPENDENT_AMBULATORY_CARE_PROVIDER_SITE_OTHER): Payer: 59 | Admitting: Family Medicine

## 2016-07-29 ENCOUNTER — Encounter: Payer: Self-pay | Admitting: Family Medicine

## 2016-07-29 VITALS — BP 130/72 | HR 68 | Temp 98.3°F | Ht 65.75 in | Wt 148.0 lb

## 2016-07-29 DIAGNOSIS — Z01419 Encounter for gynecological examination (general) (routine) without abnormal findings: Secondary | ICD-10-CM

## 2016-07-29 DIAGNOSIS — M949 Disorder of cartilage, unspecified: Secondary | ICD-10-CM | POA: Diagnosis not present

## 2016-07-29 DIAGNOSIS — Z0000 Encounter for general adult medical examination without abnormal findings: Secondary | ICD-10-CM | POA: Diagnosis not present

## 2016-07-29 DIAGNOSIS — I10 Essential (primary) hypertension: Secondary | ICD-10-CM | POA: Diagnosis not present

## 2016-07-29 DIAGNOSIS — Z1211 Encounter for screening for malignant neoplasm of colon: Secondary | ICD-10-CM | POA: Diagnosis not present

## 2016-07-29 DIAGNOSIS — M899 Disorder of bone, unspecified: Secondary | ICD-10-CM

## 2016-07-29 MED ORDER — LORAZEPAM 1 MG PO TABS: ORAL_TABLET | ORAL | 2 refills | Status: DC

## 2016-07-29 MED ORDER — AMLODIPINE BESYLATE 5 MG PO TABS: 5.0000 mg | ORAL_TABLET | Freq: Every day | ORAL | 3 refills | Status: DC

## 2016-07-29 NOTE — Assessment & Plan Note (Addendum)
dexa 2015- declines another at this time  No falls or fx On D and exercises

## 2016-07-29 NOTE — Assessment & Plan Note (Addendum)
bp in fair control at this time  BP Readings from Last 1 Encounters:  07/29/16 130/72   No changes needed Disc lifstyle change with low sodium diet and exercise  Labs reviewed

## 2016-07-29 NOTE — Progress Notes (Signed)
Subjective:    Patient ID: Denise Grant, female    DOB: February 02, 1955, 61 y.o.   MRN: [id number]  HPI  Here for health maintenance exam and to review chronic medical problems   Feeling good overall   Wt Readings from Last 3 Encounters:  07/29/16 148 lb (67.1 kg)  07/22/15 147 lb 12.8 oz (67 kg)  02/10/15 145 lb 4 oz (65.9 kg)  walks for exercise  (2 miles per day average)  Eats healthy as well  24.07 kg/m   dexa 5/14-osteopenia worse at the [facility] Does not want to do another dexa No falls or fractures  Takes D / but no calcium (intolerant)  Exercise-walks   Pap 5/15 -normal Time for 3 year pap  No gyn symptoms  occ hot flashes    Mammogram nl 5/18  Self breast exam- no lumps or changes   ifob neg 7/17 Still declines colonoscopy  Wants to do cologuard/ ins covers   Flu shot last fall Tetanus shot 4/14  fam hx: sister with melanoma  No new moles  She went to derm in feb for skin check Froze a few areas on nose   bp is stable today  No cp or palpitations or headaches or edema  No side effects to medicines  BP Readings from Last 3 Encounters:  07/29/16 130/72  07/22/15 130/70  02/10/15 139/80    Takes amlodipine   Cholesterol: Lab Results  Component Value Date   CHOL 159 07/22/2016   CHOL 165 07/17/2015   CHOL 158 06/20/2014   Lab Results  Component Value Date   HDL 57.20 07/22/2016   HDL 61.90 07/17/2015   HDL 57.80 06/20/2014   Lab Results  Component Value Date   LDLCALC 88 07/22/2016   LDLCALC 92 07/17/2015   LDLCALC 91 06/20/2014   Lab Results  Component Value Date   TRIG 68.0 07/22/2016   TRIG 58.0 07/17/2015   TRIG 48.0 06/20/2014   Lab Results  Component Value Date   CHOLHDL 3 07/22/2016   CHOLHDL 3 07/17/2015   CHOLHDL 3 06/20/2014   No results found for: LDLDIRECT  Good cholesterol profile    Labs Results for orders placed or performed in visit on 07/22/16  CBC with Differential/Platelet  Result Value Ref Range   WBC 4.0  4.0 - 10.5 K/uL   RBC 4.57 3.87 - 5.11 Mil/uL   Hemoglobin 14.1 12.0 - 15.0 g/dL   HCT 40.7 36.0 - 46.0 %   MCV 89.0 78.0 - 100.0 fl   MCHC 34.5 30.0 - 36.0 g/dL   RDW 13.1 11.5 - 15.5 %   Platelets 182.0 150.0 - 400.0 K/uL   Neutrophils Relative % 61.4 43.0 - 77.0 %   Lymphocytes Relative 27.8 12.0 - 46.0 %   Monocytes Relative 9.1 3.0 - 12.0 %   Eosinophils Relative 1.1 0.0 - 5.0 %   Basophils Relative 0.6 0.0 - 3.0 %   Neutro Abs 2.5 1.4 - 7.7 K/uL   Lymphs Abs 1.1 0.7 - 4.0 K/uL   Monocytes Absolute 0.4 0.1 - 1.0 K/uL   Eosinophils Absolute 0.0 0.0 - 0.7 K/uL   Basophils Absolute 0.0 0.0 - 0.1 K/uL  Comprehensive metabolic panel  Result Value Ref Range   Sodium 140 135 - 145 mEq/L   Potassium 4.7 3.5 - 5.1 mEq/L   Chloride 105 96 - 112 mEq/L   CO2 30 19 - 32 mEq/L   Glucose, Bld 99 70 - 99 mg/dL  BUN 14 6 - 23 mg/dL   Creatinine, Ser 0.87 0.40 - 1.20 mg/dL   Total Bilirubin 0.8 0.2 - 1.2 mg/dL   Alkaline Phosphatase 60 39 - 117 U/L   AST 26 0 - 37 U/L   ALT 20 0 - 35 U/L   Total Protein 7.2 6.0 - 8.3 g/dL   Albumin 4.4 3.5 - 5.2 g/dL   Calcium 9.8 8.4 - 10.5 mg/dL   GFR 70.04 >60.00 mL/min  Lipid panel  Result Value Ref Range   Cholesterol 159 0 - 200 mg/dL   Triglycerides 68.0 0.0 - 149.0 mg/dL   HDL 57.20 >39.00 mg/dL   VLDL 13.6 0.0 - 40.0 mg/dL   LDL Cholesterol 88 0 - 99 mg/dL   Total CHOL/HDL Ratio 3    NonHDL 102.09   TSH  Result Value Ref Range   TSH 4.50 0.35 - 4.50 uIU/mL     Patient Active Problem List   Diagnosis Date Noted  . Essential hypertension 01/01/2015  . Migraine with vertigo 05/21/2014  . History of Chiari malformation 05/21/2014  . Spasm of back muscles 10/02/2013  . Atrophic vaginitis 05/01/2013  . Microhematuria 04/15/2013  . Colon cancer screening 04/20/2012  . Encounter for routine gynecological examination 11/24/2010  . Routine general medical examination at a health care facility 11/18/2010  . INSOMNIA 01/27/2010  .  HEADACHE 03/31/2008  . Disorder of bone and cartilage 09/18/2007  . Generalized anxiety disorder 05/05/2006  . MITRAL VALVE PROLAPSE 05/05/2006  . ALLERGIC RHINITIS 05/05/2006  . FIBROCYSTIC BREAST DISEASE 05/05/2006  . SEBORRHEIC DERMATITIS 05/05/2006  . KERATOSIS, ACTINIC 05/05/2006  . NECK PAIN, CHRONIC 05/05/2006   Past Medical History:  Diagnosis Date  . Actinic keratosis   . Allergic rhinitis   . Anxiety   . Chiari malformation   . DDD (degenerative disc disease)   . Diffuse cystic mastopathy   . Family history of other specified malignant neoplasm   . Headache(784.0)   . Insomnia, unspecified   . Lactose intolerance   . Mitral valve disorders(424.0)   . Osteopenia   . Seborrheic dermatitis, unspecified   . Vaginal cyst    stable   Past Surgical History:  Procedure Laterality Date  . bone spur removal  10/06   shoulder  . [place] SURGERY  2005  . Chiari Malformation surgery  6/03  . EXCISION MORTON'S NEUROMA  2/04  . LUMBAR DISC SURGERY    . TUBAL LIGATION     Social History  Substance Use Topics  . Smoking status: Former Smoker    Quit date: 01/17/2001  . Smokeless tobacco: Never Used  . Alcohol use No   Family History  Problem Relation Age of Onset  . Melanoma Sister   . Lung cancer Father   . Coronary artery disease Mother   . Heart failure Mother   . Hypertension Mother   . Anxiety disorder Son   . Lung cancer Brother        + smoker   Allergies  Allergen Reactions  . Calcium     REACTION: cannot tolerate any calcium supplement - GI sympotms  . Fluticasone Propionate     REACTION: headache/irritation  . Hctz [Hydrochlorothiazide] Other (See Comments)    Eye redness and discomfort/ slt blurred vision   . Paroxetine     REACTION: abdominal pain and constipation   Current Outpatient Prescriptions on File Prior to Visit  Medication Sig Dispense Refill  . Chlorphen-Phenyleph-Ibuprofen (ADVIL ALLERGY & CONGESTION PO) Take  by mouth as  needed.    . cyclobenzaprine (FLEXERIL) 10 MG tablet Take 0.5-1 tablets (5-10 mg total) by mouth 3 (three) times daily as needed for muscle spasms. 30 tablet 1  . meclizine (ANTIVERT) 25 MG tablet Take 1 tablet (25 mg total) by mouth 3 (three) times daily as needed for dizziness. 15 tablet 0   No current facility-administered medications on file prior to visit.     Review of Systems Review of Systems  Constitutional: Negative for fever, appetite change, fatigue and unexpected weight change.  Eyes: Negative for pain and visual disturbance.  Respiratory: Negative for cough and shortness of breath.   Cardiovascular: Negative for cp or palpitations    Gastrointestinal: Negative for nausea, diarrhea and constipation.  Genitourinary: Negative for urgency and frequency.  Skin: Negative for pallor or rash   Neurological: Negative for weakness, light-headedness, numbness and headaches.  Hematological: Negative for adenopathy. Does not bruise/bleed easily.  Psychiatric/Behavioral: Negative for dysphoric mood. The patient is not nervous/anxious.         Objective:   Physical Exam  Constitutional: She appears well-developed and well-nourished. No distress.  Well appearing   HENT:  Head: Normocephalic and atraumatic.  Right Ear: External ear normal.  Left Ear: External ear normal.  Mouth/Throat: Oropharynx is clear and moist.  Eyes: Pupils are equal, round, and reactive to light. Conjunctivae and EOM are normal. No scleral icterus.  Neck: Normal range of motion. Neck supple. No JVD present. Carotid bruit is not present. No thyromegaly present.  Cardiovascular: Normal rate, regular rhythm, normal heart sounds and intact distal pulses.  Exam reveals no gallop.   Pulmonary/Chest: Effort normal and breath sounds normal. No respiratory distress. She has no wheezes. She exhibits no tenderness.  Abdominal: Soft. Bowel sounds are normal. She exhibits no distension, no abdominal bruit and no mass. There  is no tenderness.  Genitourinary: No breast swelling, tenderness, discharge or bleeding.  Genitourinary Comments: Breast exam: No mass, nodules, thickening, tenderness, bulging, retraction, inflamation, nipple discharge or skin changes noted.  No axillary or clavicular LA.             Anus appears normal w/o hemorrhoids or masses     External genitalia : nl appearance and hair distribution/no lesions     Urethral meatus : nl size, no lesions or prolapse     Urethra: no masses, tenderness or scarring    Bladder : no masses or tenderness     Vagina: nl general appearance, no discharge or  Lesions, no significant cystocele  or rectocele  (some mild atrophy w/o mucosal breakdown)    Cervix: no lesions/ discharge or friability    Uterus: nl size, contour, position, and mobility (not fixed) , non tender    Adnexa : no masses, tenderness, enlargement or nodularity       Musculoskeletal: Normal range of motion. She exhibits no edema or tenderness.  No kyphosis   Lymphadenopathy:    She has no cervical adenopathy.  Neurological: She is alert. She has normal reflexes. No cranial nerve deficit. She exhibits normal muscle tone. Coordination normal.  Skin: Skin is warm and dry. No rash noted. No erythema. No pallor.  Solar lentigines diffusely Some brown nevi  Psychiatric: She has a normal mood and affect.          Assessment & Plan:   Problem List Items Addressed This Visit      Cardiovascular and Mediastinum   Essential hypertension    bp in fair control  at this time  BP Readings from Last 1 Encounters:  07/29/16 130/72   No changes needed Disc lifstyle change with low sodium diet and exercise  Labs reviewed        Relevant Medications   amLODipine (NORVASC) 5 MG tablet     Musculoskeletal and Integument   Disorder of bone and cartilage    dexa 2015- declines another at this time  No falls or fx On D and exercises         Other   Colon cancer  screening    Declines colonoscopy Ref for Cologuard program      Encounter for routine gynecological examination   Relevant Orders   Cytology - PAP   Routine general medical examination at a health care facility - Primary    Reviewed health habits including diet and exercise and skin cancer prevention Reviewed appropriate screening tests for age  Also reviewed health mt list, fam hx and immunization status , as well as social and family history   See HPI Declines another dexa (takes D and walks)-no fx Pap /gyn exam done today Has had dermatology skin screen Declines colonoscopy for screening-will do cologaurd program

## 2016-07-29 NOTE — Patient Instructions (Addendum)
We will sign you up for the cologuard program today for colon screening  If you want to get a colonoscopy please alert [place]    Keep walking  Labs look good  Gyn exam and pap today

## 2016-07-31 NOTE — Assessment & Plan Note (Signed)
Reviewed health habits including diet and exercise and skin cancer prevention Reviewed appropriate screening tests for age  Also reviewed health mt list, fam hx and immunization status , as well as social and family history   See HPI Declines another dexa (takes D and walks)-no fx Pap /gyn exam done today Has had dermatology skin screen Declines colonoscopy for screening-will do cologaurd program

## 2016-07-31 NOTE — Assessment & Plan Note (Signed)
Declines colonoscopy Ref for Cologuard program

## 2016-08-02 LAB — CYTOLOGY - PAP
DIAGNOSIS: NEGATIVE
HPV (WINDOPATH): NOT DETECTED

## 2016-08-03 ENCOUNTER — Encounter: Payer: Self-pay | Admitting: *Deleted

## 2016-08-03 LAB — COLOGUARD: COLOGUARD: NEGATIVE

## 2016-08-11 ENCOUNTER — Encounter: Payer: Self-pay | Admitting: *Deleted

## 2016-11-23 ENCOUNTER — Ambulatory Visit: Payer: Self-pay | Admitting: *Deleted

## 2016-11-23 ENCOUNTER — Ambulatory Visit: Payer: 59 | Admitting: Internal Medicine

## 2016-11-23 NOTE — Telephone Encounter (Signed)
She feels the congestion has traveled into her chest and now she is coughing up green mucus and starting to feel bad.  I got her an appt with Webb Silversmith, NP at [facility] at [facility] for Nov 24, 2016 at 11:00.  Reason for Disposition . Lots of coughing  Answer Assessment - Initial Assessment Questions 1. LOCATION: "Where does it hurt?"      No headaches or pain 2. ONSET: "When did the sinus pain start?"  (e.g., hours, days)      Started 4 days ago.  Yesterday coughing up green stuff and getting into chest.   I'm feeling bad now.  Denies body aches and fever. 3. SEVERITY: "How bad is the pain?"   (Scale 1-10; mild, moderate or severe)   - MILD (1-3): doesn't interfere with normal activities    - MODERATE (4-7): interferes with normal activities (e.g., work or school) or awakens from sleep   - SEVERE (8-10): excruciating pain and patient unable to do any normal activities        None 4. RECURRENT SYMPTOM: "Have you ever had sinus problems before?" If so, ask: "When was the last time?" and "What happened that time?"      Yes.  Antibiotics 5. NASAL CONGESTION: "Is the nose blocked?" If so, ask, "Can you open it or must you breathe through the mouth?"     No much out my nose. 6. NASAL DISCHARGE: "Do you have discharge from your nose?" If so ask, "What color?"     No 7. FEVER: "Do you have a fever?" If so, ask: "What is it, how was it measured, and when did it start?"      No 8. OTHER SYMPTOMS: "Do you have any other symptoms?" (e.g., sore throat, cough, earache, difficulty breathing)     I'm coughing now and feel it's traveling into my chest. 9. PREGNANCY: "Is there any chance you are pregnant?" "When was your last menstrual period?"     No  Protocols used: SINUS PAIN OR CONGESTION-A-AH

## 2016-11-24 ENCOUNTER — Ambulatory Visit: Payer: 59 | Admitting: Internal Medicine

## 2016-11-24 ENCOUNTER — Encounter: Payer: Self-pay | Admitting: Internal Medicine

## 2016-11-24 VITALS — BP 128/82 | HR 81 | Temp 98.5°F | Wt 154.0 lb

## 2016-11-24 DIAGNOSIS — J302 Other seasonal allergic rhinitis: Secondary | ICD-10-CM | POA: Diagnosis not present

## 2016-11-24 NOTE — Patient Instructions (Signed)
Allergic Rhinitis Allergic rhinitis is when the mucous membranes in the nose respond to allergens. Allergens are particles in the air that cause your body to have an allergic reaction. This causes you to release allergic antibodies. Through a chain of events, these eventually cause you to release histamine into the blood stream. Although meant to protect the body, it is this release of histamine that causes your discomfort, such as frequent sneezing, congestion, and an itchy, runny nose. What are the causes? Seasonal allergic rhinitis (hay fever) is caused by pollen allergens that may come from grasses, trees, and weeds. Year-round allergic rhinitis (perennial allergic rhinitis) is caused by allergens such as house dust mites, pet dander, and mold spores. What are the signs or symptoms?  Nasal stuffiness (congestion).  Itchy, runny nose with sneezing and tearing of the eyes. How is this diagnosed? Your health care provider can help you determine the allergen or allergens that trigger your symptoms. If you and your health care provider are unable to determine the allergen, skin or blood testing may be used. Your health care provider will diagnose your condition after taking your health history and performing a physical exam. Your health care provider may assess you for other related conditions, such as asthma, pink eye, or an ear infection. How is this treated? Allergic rhinitis does not have a cure, but it can be controlled by:  Medicines that block allergy symptoms. These may include allergy shots, nasal sprays, and oral antihistamines.  Avoiding the allergen. Hay fever may often be treated with antihistamines in pill or nasal spray forms. Antihistamines block the effects of histamine. There are over-the-counter medicines that may help with nasal congestion and swelling around the eyes. Check with your health care provider before taking or giving this medicine. If avoiding the allergen or the  medicine prescribed do not work, there are many new medicines your health care provider can prescribe. Stronger medicine may be used if initial measures are ineffective. Desensitizing injections can be used if medicine and avoidance does not work. Desensitization is when a patient is given ongoing shots until the body becomes less sensitive to the allergen. Make sure you follow up with your health care provider if problems continue. Follow these instructions at home: It is not possible to completely avoid allergens, but you can reduce your symptoms by taking steps to limit your exposure to them. It helps to know exactly what you are allergic to so that you can avoid your specific triggers. Contact a health care provider if:  You have a fever.  You develop a cough that does not stop easily (persistent).  You have shortness of breath.  You start wheezing.  Symptoms interfere with normal daily activities. This information is not intended to replace advice given to you by your health care provider. Make sure you discuss any questions you have with your health care provider. Document Released: 09/28/2000 Document Revised: 09/04/2015 Document Reviewed: 09/10/2012 Elsevier Interactive Patient Education  2017 [employer]  

## 2016-11-24 NOTE — Progress Notes (Signed)
HPI  Pt presents to the clinic today with c/o sore throat and cough. She reports this started 4-5 days ago. She denies difficulty swallowing. The cough is productive of yellow/green mucous, mostly first thing in the morning. She denies fever, chills or body aches. She has taken Advil Allergy with some relief. She has not had sick contacts.  Review of Systems        Past Medical History:  Diagnosis Date  . Actinic keratosis   . Allergic rhinitis   . Anxiety   . Chiari malformation   . DDD (degenerative disc disease)   . Diffuse cystic mastopathy   . Family history of other specified malignant neoplasm   . Headache(784.0)   . Insomnia, unspecified   . Lactose intolerance   . Mitral valve disorders(424.0)   . Osteopenia   . Seborrheic dermatitis, unspecified   . Vaginal cyst    stable    Family History  Problem Relation Age of Onset  . Melanoma Sister   . Lung cancer Father   . Coronary artery disease Mother   . Heart failure Mother   . Hypertension Mother   . Anxiety disorder Son   . Lung cancer Brother        + smoker    Social History   Socioeconomic History  . Marital status: Married    Spouse name: Not on file  . Number of children: Not on file  . Years of education: Not on file  . Highest education level: Not on file  Social Needs  . Financial resource strain: Not on file  . Food insecurity - worry: Not on file  . Food insecurity - inability: Not on file  . Transportation needs - medical: Not on file  . Transportation needs - non-medical: Not on file  Occupational History  . Not on file  Tobacco Use  . Smoking status: Former Smoker    Last attempt to quit: 01/17/2001    Years since quitting: 15.8  . Smokeless tobacco: Never Used  Substance and Sexual Activity  . Alcohol use: No    Alcohol/week: 0.0 oz  . Drug use: No  . Sexual activity: Not on file  Other Topics Concern  . Not on file  Social History Narrative   Walks daily for exercise       Married--husband diagnosed with stomach cancer (5/11)   Lost her husband 4/12 to cancer     Allergies  Allergen Reactions  . Calcium     REACTION: cannot tolerate any calcium supplement - GI sympotms  . Fluticasone Propionate     REACTION: headache/irritation  . Hctz [Hydrochlorothiazide] Other (See Comments)    Eye redness and discomfort/ slt blurred vision   . Paroxetine     REACTION: abdominal pain and constipation     Constitutional: Denies headache, fatigue, fever or abrupt weight changes.  HEENT:  Positive sore throat. Denies eye redness, eye pain, pressure behind the eyes, facial pain, nasal congestion, ear pain, ringing in the ears, wax buildup, runny nose or bloody nose. Respiratory: Positive cough. Denies difficulty breathing or shortness of breath.  Cardiovascular: Denies chest pain, chest tightness, palpitations or swelling in the hands or feet.   No other specific complaints in a complete review of systems (except as listed in HPI above).  Objective:   BP 128/82   Pulse 81   Temp 98.5 F (36.9 C) (Oral)   Wt 154 lb (69.9 kg)   SpO2 98%   BMI 25.05 kg/m  Wt Readings from Last 3 Encounters:  11/24/16 154 lb (69.9 kg)  07/29/16 148 lb (67.1 kg)  07/22/15 147 lb 12.8 oz (67 kg)     General: Appears her stated age, in NAD. HEENT: Head: normal shape and size; Right Ear: TM gray and intact, normal light reflex; Left Ear: cermen impaction; Nose: mucosa pink and moist, septum midline; Throat/Mouth: + PND. Teeth present, mucosa pink and moist, no exudate noted, no lesions or ulcerations noted.  Neck: No cervical lymphadenopathy.  Pulmonary/Chest: Normal effort and positive vesicular breath sounds. No respiratory distress. No wheezes, rales or ronchi noted.       Assessment & Plan:   Allergies:  Get some rest and drink plenty of water Do salt water gargles for the sore throat Start Mucinex 600 mg every 12 hours  RTC as needed or if symptoms  persist.   Webb Silversmith, NP

## 2017-01-03 ENCOUNTER — Other Ambulatory Visit: Payer: Self-pay | Admitting: Family Medicine

## 2017-02-23 ENCOUNTER — Other Ambulatory Visit: Payer: Self-pay | Admitting: Family Medicine

## 2017-02-23 NOTE — Telephone Encounter (Signed)
Last filled on 07/29/16 #30 with 2 additional refills, CPE scheduled for 08/04/17

## 2017-05-30 ENCOUNTER — Encounter: Payer: Self-pay | Admitting: Family Medicine

## 2017-05-30 ENCOUNTER — Ambulatory Visit: Payer: 59 | Admitting: Family Medicine

## 2017-05-30 VITALS — BP 142/70 | HR 77 | Temp 98.6°F | Ht 65.75 in | Wt 151.0 lb

## 2017-05-30 DIAGNOSIS — M546 Pain in thoracic spine: Secondary | ICD-10-CM

## 2017-05-30 MED ORDER — MELOXICAM 15 MG PO TABS: 15.0000 mg | ORAL_TABLET | Freq: Every day | ORAL | 1 refills | Status: DC | PRN

## 2017-05-30 MED ORDER — METHOCARBAMOL 500 MG PO TABS: 500.0000 mg | ORAL_TABLET | Freq: Four times a day (QID) | ORAL | 1 refills | Status: DC | PRN

## 2017-05-30 NOTE — Patient Instructions (Signed)
Try meloxicam instead of ibuprofen Robaxin (methocarbamol) is less sedating than flexeril to try  Heat  Massage Keep moving but not heavy lifting

## 2017-05-30 NOTE — Progress Notes (Signed)
Subjective:    Patient ID: Denise Grant, female    DOB: 1955-01-22, 62 y.o.   MRN: [id number]  HPI Here for back pain - ? 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 Thoracic back pain 05/30/2017  . Essential hypertension 01/01/2015  . Migraine with vertigo 05/21/2014  . History of Chiari malformation 05/21/2014  . Spasm of back muscles 10/02/2013  . Atrophic vaginitis 05/01/2013  . Microhematuria 04/15/2013  . Colon cancer screening 04/20/2012  . Encounter for routine gynecological examination 11/24/2010  . Routine general medical examination at a health care facility 11/18/2010  . INSOMNIA 01/27/2010  . HEADACHE 03/31/2008  . Disorder of bone and cartilage 09/18/2007  . Generalized anxiety disorder 05/05/2006  . MITRAL VALVE PROLAPSE 05/05/2006  . ALLERGIC RHINITIS 05/05/2006  . FIBROCYSTIC BREAST DISEASE 05/05/2006  . SEBORRHEIC DERMATITIS 05/05/2006  . KERATOSIS, ACTINIC 05/05/2006  . NECK PAIN, CHRONIC 05/05/2006   Past Medical History:  Diagnosis Date  . Actinic keratosis   . Allergic rhinitis   . Anxiety   . Chiari malformation   . DDD (degenerative disc disease)   . Diffuse cystic mastopathy   . Family history of other specified malignant neoplasm   . Headache(784.0)   . Insomnia, unspecified   . Lactose intolerance   . Mitral valve disorders(424.0)   . Osteopenia   . Seborrheic dermatitis,  unspecified   . Vaginal cyst    stable   Past Surgical History:  Procedure Laterality Date  . bone spur removal  10/06   shoulder  . [place] SURGERY  2005  . Chiari Malformation surgery  6/03  . EXCISION MORTON'S NEUROMA  2/04  . LUMBAR DISC SURGERY    . TUBAL LIGATION     Social History   Tobacco Use  . Smoking status: Former Smoker    Last attempt to quit: 01/17/2001    Years since quitting: 16.3  . Smokeless tobacco: Never Used  Substance Use Topics  . Alcohol use: No    Alcohol/week: 0.0 oz  . Drug use: No   Family History  Problem Relation Age of Onset  . Melanoma Sister   . Lung cancer Father   . Coronary artery disease Mother   . Heart failure Mother   . Hypertension Mother   . Anxiety disorder Son   . Lung cancer Brother        + smoker   Allergies  Allergen Reactions  . Calcium     REACTION: cannot tolerate any calcium supplement - GI sympotms  . Fluticasone Propionate     REACTION: headache/irritation  . Hctz [Hydrochlorothiazide] Other (See Comments)    Eye redness and discomfort/ slt blurred vision   . Paroxetine     REACTION: abdominal pain and constipation   Current Outpatient  Medications on File Prior to Visit  Medication Sig Dispense Refill  . amLODipine (NORVASC) 5 MG tablet TAKE 1 TABLET BY MOUTH EVERY DAY 90 tablet 2  . Chlorphen-Phenyleph-Ibuprofen (ADVIL ALLERGY & CONGESTION PO) Take by mouth as needed.    . cyclobenzaprine (FLEXERIL) 10 MG tablet Take 0.5-1 tablets (5-10 mg total) by mouth 3 (three) times daily as needed for muscle spasms. 30 tablet 1  . LORazepam (ATIVAN) 1 MG tablet TAKE 1/2 (ONE-HALF) TABLET BY MOUTH AT BEDTIME AS NEEDED 30 tablet 2  . meclizine (ANTIVERT) 25 MG tablet Take 1 tablet (25 mg total) by mouth 3 (three) times daily as needed for dizziness. 15 tablet 0   No current facility-administered medications on file prior to visit.     Review of Systems  Constitutional: Negative for activity change, appetite  change, fatigue, fever and unexpected weight change.  HENT: Negative for congestion, ear pain, rhinorrhea, sinus pressure and sore throat.   Eyes: Negative for pain, redness and visual disturbance.  Respiratory: Negative for cough, shortness of breath and wheezing.   Cardiovascular: Negative for chest pain and palpitations.  Gastrointestinal: Negative for abdominal pain, blood in stool, constipation and diarrhea.  Endocrine: Negative for polydipsia and polyuria.  Genitourinary: Negative for dysuria, frequency and urgency.  Musculoskeletal: Positive for back pain. Negative for arthralgias and myalgias.  Skin: Negative for pallor and rash.  Allergic/Immunologic: Negative for environmental allergies.  Neurological: Negative for dizziness, syncope and headaches.  Hematological: Negative for adenopathy. Does not bruise/bleed easily.  Psychiatric/Behavioral: Negative for decreased concentration and dysphoric mood. The patient is not nervous/anxious.        Objective:   Physical Exam  Constitutional: She appears well-developed and well-nourished. No distress.  Well appearing   HENT:  Head: Normocephalic and atraumatic.  Eyes: Pupils are equal, round, and reactive to light. Conjunctivae and EOM are normal. No scleral icterus.  Neck: Normal range of motion. Neck supple.  Cardiovascular: Normal rate and regular rhythm.  Pulmonary/Chest: Effort normal and breath sounds normal. She has no wheezes. She has no rales.  Abdominal: Soft. Bowel sounds are normal. She exhibits no distension. There is no tenderness.  Musculoskeletal: She exhibits tenderness.       Thoracic back: She exhibits tenderness and spasm. She exhibits normal range of motion, no bony tenderness, no edema and no deformity.       Lumbar back: She exhibits decreased range of motion, tenderness and spasm. She exhibits no bony tenderness and no edema.  Tenderness and spasm medial to mid L scapula  No skin change  Nl rom of spine    Some inc pain with neck flex and R tilt  Mild trapezius tenderness  Lymphadenopathy:    She has no cervical adenopathy.  Neurological: She is alert. She has normal strength and normal reflexes. She displays no atrophy. No cranial nerve deficit or sensory deficit. She exhibits normal muscle tone. Coordination normal.  Nl grip bilaterally  Nl gait   Skin: Skin is warm and dry. No rash noted. No erythema. No pallor.  Psychiatric: She has a normal mood and affect.          Assessment & Plan:   Problem List Items Addressed This Visit      Other   Thoracic back pain - Primary    L scapular area  Suspect thoracic or rhomberg strain/spasm Recommend heat  Massage Stretching  meloxicam 15 mg with food daily Robaxin 500 mg prn with warning of sedation   Update if  not starting to improve in a week or if worsening   Update if any neuro symptoms        Relevant Medications   meloxicam (MOBIC) 15 MG tablet   methocarbamol (ROBAXIN) 500 MG tablet

## 2017-05-31 NOTE — Assessment & Plan Note (Signed)
L scapular area  Suspect thoracic or rhomberg strain/spasm Recommend heat  Massage Stretching  meloxicam 15 mg with food daily Robaxin 500 mg prn with warning of sedation   Update if not starting to improve in a week or if worsening   Update if any neuro symptoms

## 2017-06-06 ENCOUNTER — Other Ambulatory Visit: Payer: Self-pay | Admitting: Family Medicine

## 2017-06-06 DIAGNOSIS — Z1231 Encounter for screening mammogram for malignant neoplasm of breast: Secondary | ICD-10-CM

## 2017-06-27 ENCOUNTER — Ambulatory Visit
Admission: RE | Admit: 2017-06-27 | Discharge: 2017-06-27 | Disposition: A | Discharge status: Home / Self Care | Payer: 59 | Source: Ambulatory Visit | Attending: Family Medicine | Admitting: Family Medicine

## 2017-06-27 DIAGNOSIS — Z1231 Encounter for screening mammogram for malignant neoplasm of breast: Secondary | ICD-10-CM | POA: Diagnosis present

## 2017-06-27 IMAGING — MG MM DIGITAL SCREENING BILAT W/ CAD
4 series · 4 of 4 positions shown · non-contrast
Comparison: Previous exam(s).

CLINICAL DATA: Screening.

EXAM:
DIGITAL SCREENING BILATERAL MAMMOGRAM WITH CAD

[L MLO]
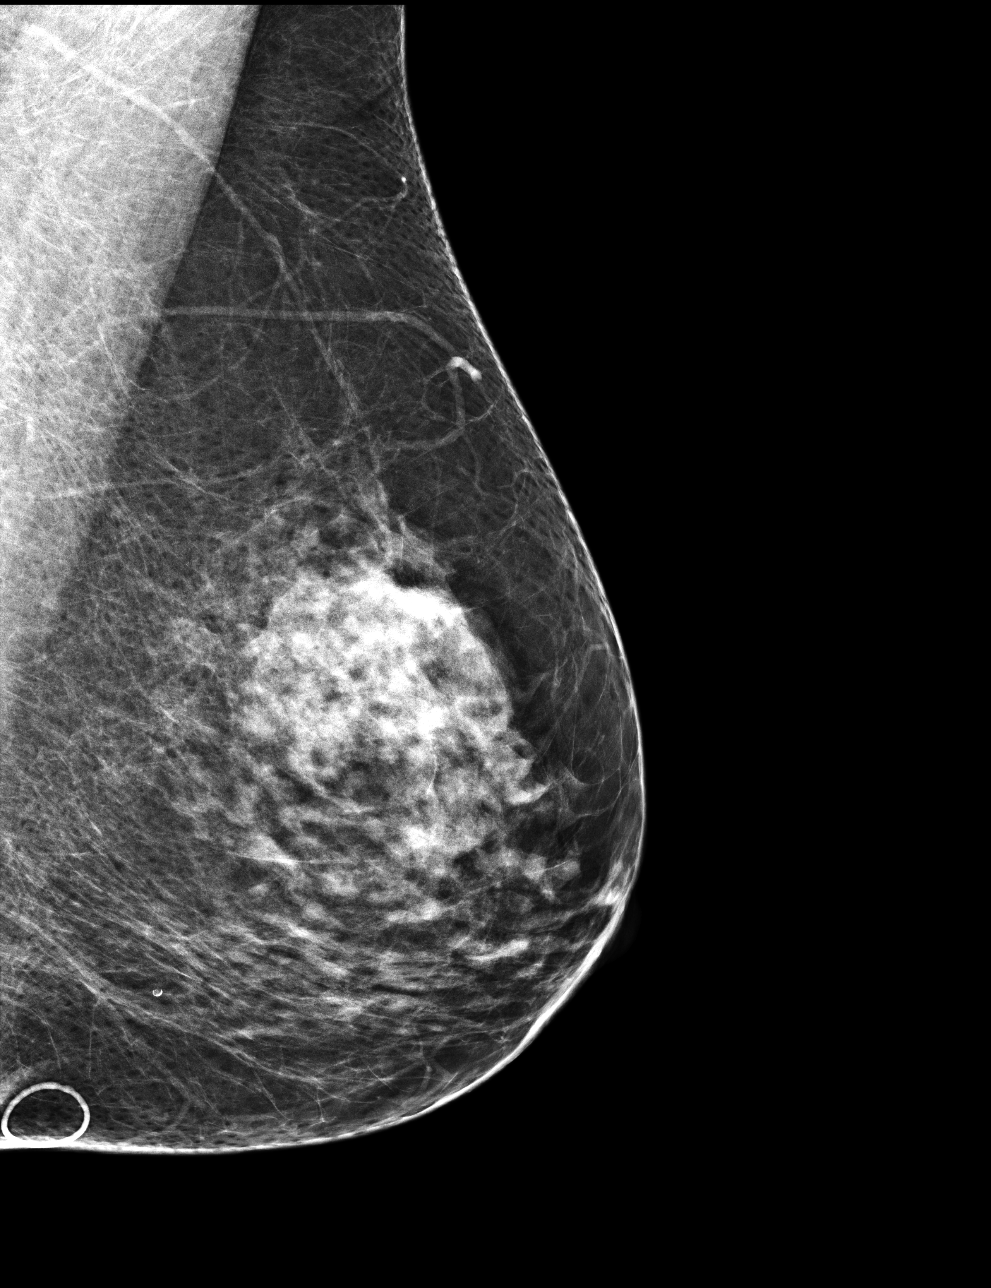

[R CC]
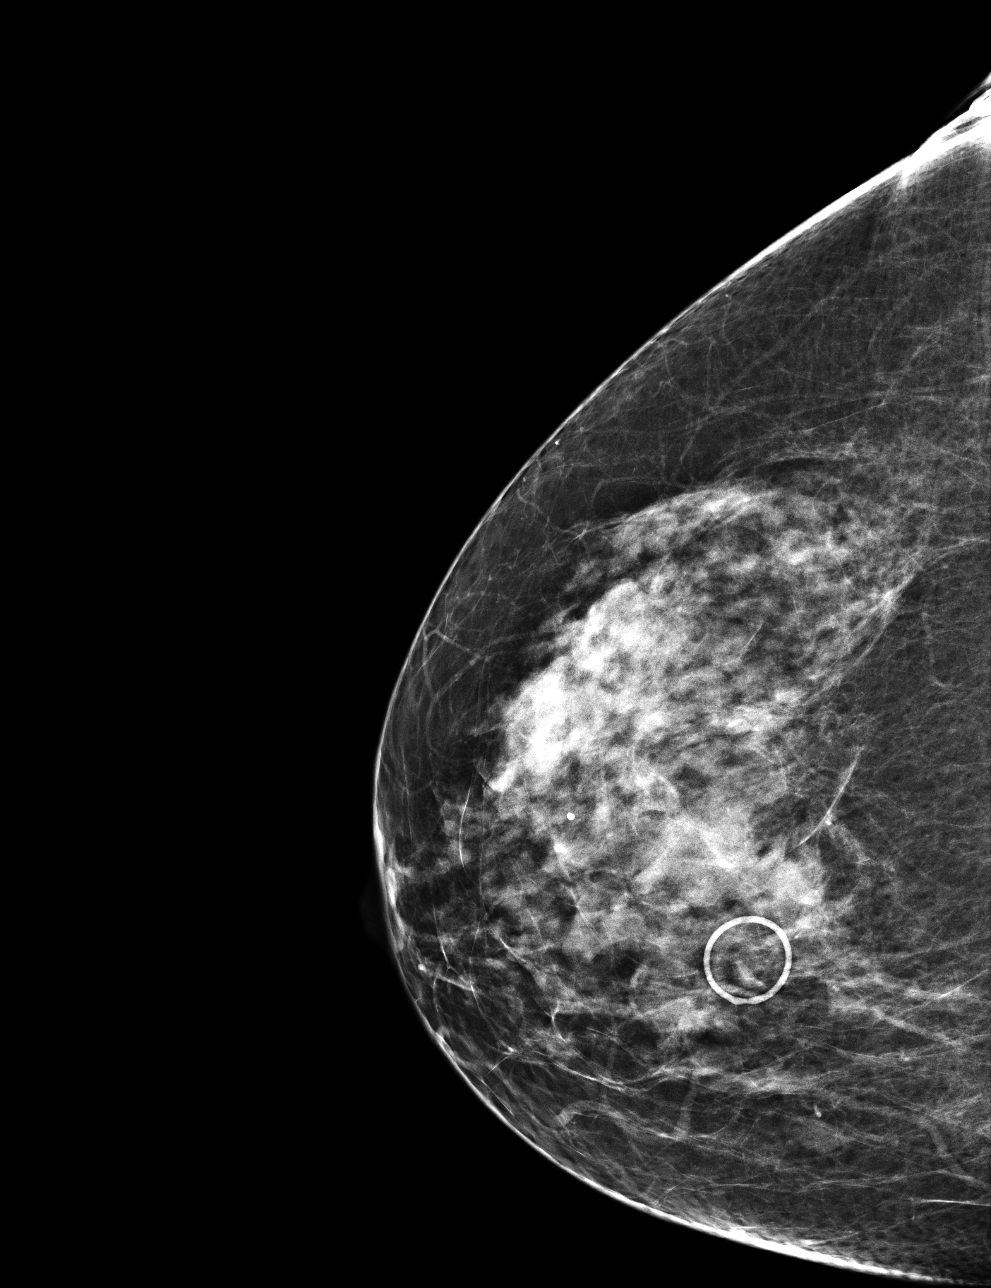

[R MLO]
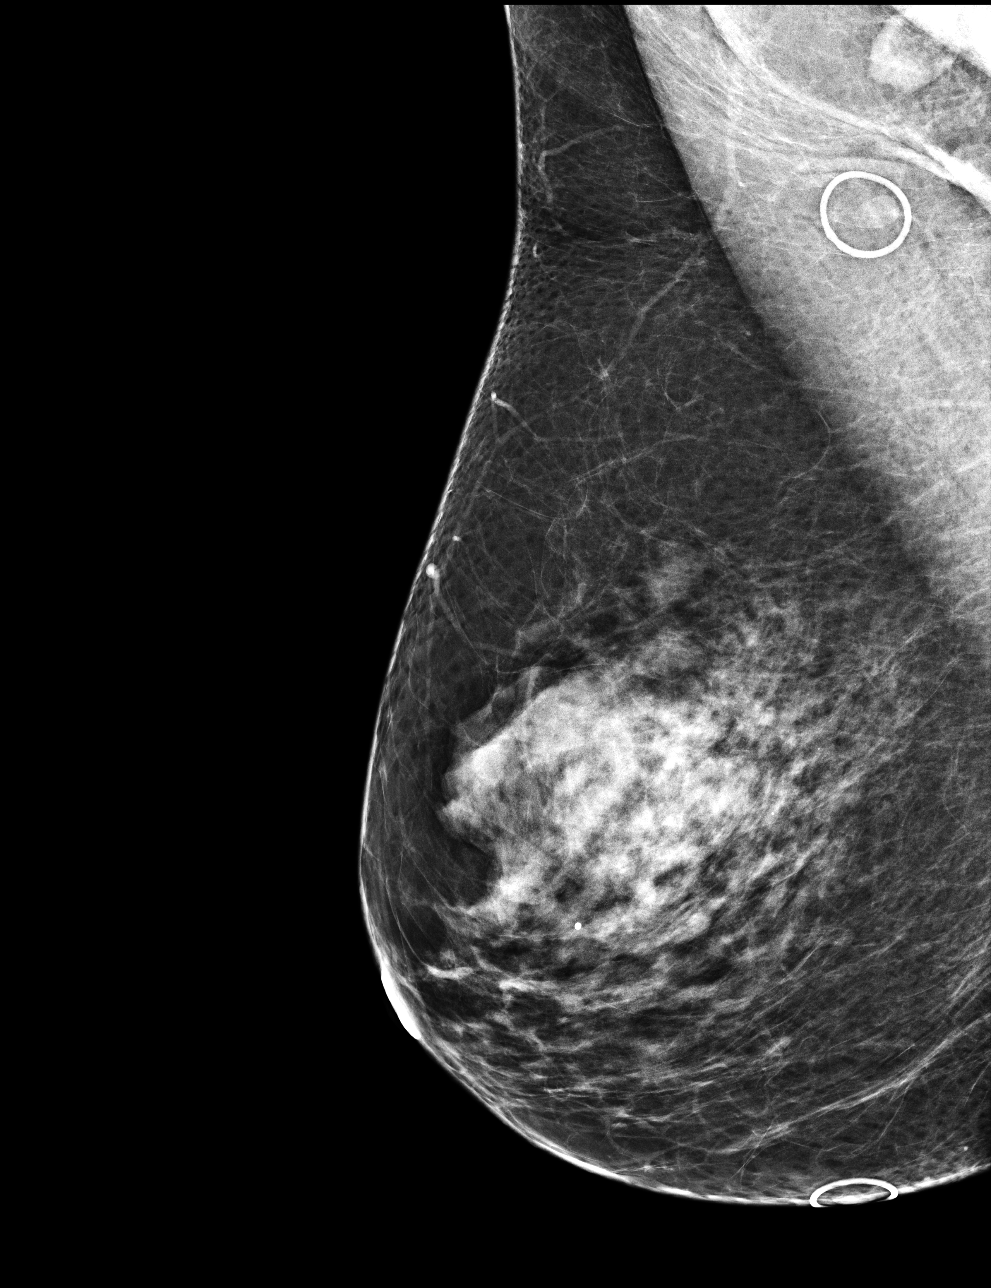

[L CC]
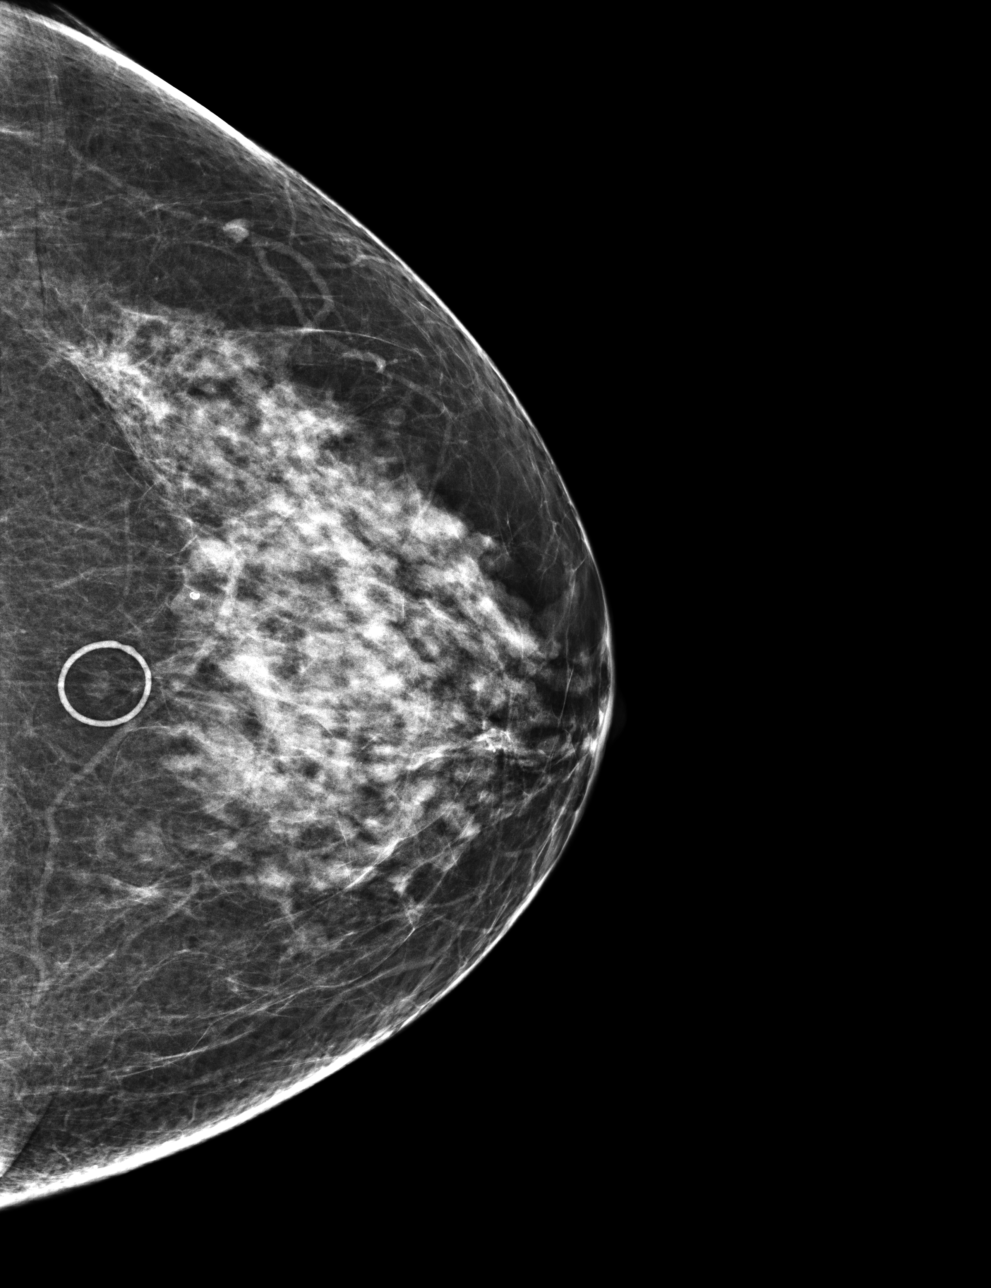

[4 of 4 positions shown; findings below may reference images not displayed]

ACR Breast Density Category c: The breast tissue is heterogeneously
dense, which may obscure small masses.
FINDINGS: There are no findings suspicious for malignancy. Images were
processed with CAD.
IMPRESSION: No mammographic evidence of malignancy. A result letter of this
screening mammogram will be mailed directly to the patient.

RECOMMENDATION:
Screening mammogram in one year. (Code:[0J])

BI-RADS CATEGORY  1: Negative.

## 2017-07-17 ENCOUNTER — Telehealth: Payer: Self-pay | Admitting: Family Medicine

## 2017-07-17 DIAGNOSIS — Z0000 Encounter for general adult medical examination without abnormal findings: Secondary | ICD-10-CM

## 2017-07-17 DIAGNOSIS — I10 Essential (primary) hypertension: Secondary | ICD-10-CM

## 2017-07-17 NOTE — Telephone Encounter (Signed)
-----   Message from Ellamae Sia sent at 07/12/2017  3:40 PM EDT ----- Regarding: Lab orders for  Friday, 7.12.19 Patient is scheduled for CPX labs, please order future labs, Thanks , Karna Christmas

## 2017-07-28 ENCOUNTER — Other Ambulatory Visit (INDEPENDENT_AMBULATORY_CARE_PROVIDER_SITE_OTHER): Payer: 59

## 2017-07-28 DIAGNOSIS — I10 Essential (primary) hypertension: Secondary | ICD-10-CM

## 2017-07-28 LAB — CBC WITH DIFFERENTIAL/PLATELET
BASOS PCT: 0.8 % (ref 0.0–3.0)
Basophils Absolute: 0 10*3/uL (ref 0.0–0.1)
EOS ABS: 0 10*3/uL (ref 0.0–0.7)
EOS PCT: 1 % (ref 0.0–5.0)
HCT: 39.9 % (ref 36.0–46.0)
HEMOGLOBIN: 13.6 g/dL (ref 12.0–15.0)
LYMPHS ABS: 1.1 10*3/uL (ref 0.7–4.0)
Lymphocytes Relative: 28.4 % (ref 12.0–46.0)
MCHC: 34 g/dL (ref 30.0–36.0)
MCV: 90 fl (ref 78.0–100.0)
MONO ABS: 0.3 10*3/uL (ref 0.1–1.0)
Monocytes Relative: 8.6 % (ref 3.0–12.0)
NEUTROS PCT: 61.2 % (ref 43.0–77.0)
Neutro Abs: 2.5 10*3/uL (ref 1.4–7.7)
Platelets: 176 10*3/uL (ref 150.0–400.0)
RBC: 4.44 Mil/uL (ref 3.87–5.11)
RDW: 13.1 % (ref 11.5–15.5)
WBC: 4 10*3/uL (ref 4.0–10.5)

## 2017-07-28 LAB — COMPREHENSIVE METABOLIC PANEL
ALBUMIN: 4.5 g/dL (ref 3.5–5.2)
ALK PHOS: 60 U/L (ref 39–117)
ALT: 23 U/L (ref 0–35)
AST: 27 U/L (ref 0–37)
BUN: 13 mg/dL (ref 6–23)
CHLORIDE: 105 meq/L (ref 96–112)
CO2: 29 mEq/L (ref 19–32)
CREATININE: 0.83 mg/dL (ref 0.40–1.20)
Calcium: 9.8 mg/dL (ref 8.4–10.5)
GFR: 73.71 mL/min (ref >60.00)
GLUCOSE: 95 mg/dL (ref 70–99)
Potassium: 4.7 mEq/L (ref 3.5–5.1)
SODIUM: 140 meq/L (ref 135–145)
TOTAL PROTEIN: 7.1 g/dL (ref 6.0–8.3)
Total Bilirubin: 0.7 mg/dL (ref 0.2–1.2)

## 2017-07-28 LAB — LIPID PANEL
CHOL/HDL RATIO: 3
CHOLESTEROL: 156 mg/dL (ref 0–200)
HDL: 57.8 mg/dL (ref >39.00)
LDL CALC: 86 mg/dL (ref 0–99)
NONHDL: 98.03
Triglycerides: 61 mg/dL (ref 0.0–149.0)
VLDL: 12.2 mg/dL (ref 0.0–40.0)

## 2017-07-28 LAB — TSH: TSH: 5.34 u[IU]/mL — ABNORMAL HIGH (ref 0.35–4.50)

## 2017-08-04 ENCOUNTER — Ambulatory Visit (INDEPENDENT_AMBULATORY_CARE_PROVIDER_SITE_OTHER): Payer: 59 | Admitting: Family Medicine

## 2017-08-04 ENCOUNTER — Encounter: Payer: Self-pay | Admitting: Family Medicine

## 2017-08-04 VITALS — BP 135/80 | HR 86 | Temp 98.2°F | Ht 65.5 in | Wt 151.0 lb

## 2017-08-04 DIAGNOSIS — R7989 Other specified abnormal findings of blood chemistry: Secondary | ICD-10-CM

## 2017-08-04 DIAGNOSIS — Z1211 Encounter for screening for malignant neoplasm of colon: Secondary | ICD-10-CM

## 2017-08-04 DIAGNOSIS — M949 Disorder of cartilage, unspecified: Secondary | ICD-10-CM | POA: Diagnosis not present

## 2017-08-04 DIAGNOSIS — Z0000 Encounter for general adult medical examination without abnormal findings: Secondary | ICD-10-CM

## 2017-08-04 DIAGNOSIS — I10 Essential (primary) hypertension: Secondary | ICD-10-CM

## 2017-08-04 DIAGNOSIS — M899 Disorder of bone, unspecified: Secondary | ICD-10-CM

## 2017-08-04 MED ORDER — LORAZEPAM 1 MG PO TABS: ORAL_TABLET | ORAL | 2 refills | Status: DC

## 2017-08-04 MED ORDER — AMLODIPINE BESYLATE 5 MG PO TABS: 5.0000 mg | ORAL_TABLET | Freq: Every day | ORAL | 3 refills | Status: DC

## 2017-08-04 NOTE — Progress Notes (Signed)
Subjective:    Patient ID: Denise Grant, female    DOB: November 09, 1955, 62 y.o.   MRN: [id number]  HPI  Here for health maintenance exam and to review chronic medical problems   Doing well overall   Has vacation next week - beach   Wt Readings from Last 3 Encounters:  08/04/17 151 lb (68.5 kg)  05/30/17 151 lb (68.5 kg)  11/24/16 154 lb (69.9 kg)  wt is down 3 lbs  Wt is good  24.75 kg/m   ifob card 7/17 neg cologuard neg 7/18  Has not had colonoscopy   Flu shot -had one this fall   Mammogram 6/19 neg  Self breast exam -no lumps   Pap 7/18 neg with neg HPV screen  No problems/symptoms  Post menopausal   Tetanus shot 4/14  Zoster status -not interested in shingles vaccine at this time   dexa 5/14 with Openia worse at fem neck  Does not want another one No falls or fractures  Takes vit D Exercise - likes to walk / has been too hot     Sister with melanoma hx  She went to derm - had some things frozen Good check up   bp is stable today  No cp or palpitations or headaches or edema  No side effects to medicines  BP Readings from Last 3 Encounters:  08/04/17 140/80  05/30/17 (!) 142/70  11/24/16 128/82  takes amlodipine     Cholesterol Lab Results  Component Value Date   CHOL 156 07/28/2017   CHOL 159 07/22/2016   CHOL 165 07/17/2015   Lab Results  Component Value Date   HDL 57.80 07/28/2017   HDL 57.20 07/22/2016   HDL 61.90 07/17/2015   Lab Results  Component Value Date   LDLCALC 86 07/28/2017   LDLCALC 88 07/22/2016   LDLCALC 92 07/17/2015   Lab Results  Component Value Date   TRIG 61.0 07/28/2017   TRIG 68.0 07/22/2016   TRIG 58.0 07/17/2015   Lab Results  Component Value Date   CHOLHDL 3 07/28/2017   CHOLHDL 3 07/22/2016   CHOLHDL 3 07/17/2015   No results found for: LDLDIRECT Good cholesterol  Does not eat a lot of grease or fried foods   Other labs  Lab Results  Component Value Date   CREATININE 0.83 07/28/2017   BUN 13  07/28/2017   NA 140 07/28/2017   K 4.7 07/28/2017   CL 105 07/28/2017   CO2 29 07/28/2017   Lab Results  Component Value Date   ALT 23 07/28/2017   AST 27 07/28/2017   ALKPHOS 60 07/28/2017   BILITOT 0.7 07/28/2017   Lab Results  Component Value Date   WBC 4.0 07/28/2017   HGB 13.6 07/28/2017   HCT 39.9 07/28/2017   MCV 90.0 07/28/2017   PLT 176.0 07/28/2017   Lab Results  Component Value Date   TSH 5.34 (H) 07/28/2017     Patient Active Problem List   Diagnosis Date Noted  . Essential hypertension 01/01/2015  . Elevated TSH 06/25/2014  . Migraine with vertigo 05/21/2014  . History of Chiari malformation 05/21/2014  . Atrophic vaginitis 05/01/2013  . Colon cancer screening 04/20/2012  . Encounter for routine gynecological examination 11/24/2010  . Routine general medical examination at a health care facility 11/18/2010  . INSOMNIA 01/27/2010  . HEADACHE 03/31/2008  . Disorder of bone and cartilage 09/18/2007  . Generalized anxiety disorder 05/05/2006  . MITRAL VALVE PROLAPSE 05/05/2006  .  ALLERGIC RHINITIS 05/05/2006  . FIBROCYSTIC BREAST DISEASE 05/05/2006  . SEBORRHEIC DERMATITIS 05/05/2006  . KERATOSIS, ACTINIC 05/05/2006  . NECK PAIN, CHRONIC 05/05/2006   Past Medical History:  Diagnosis Date  . Actinic keratosis   . Allergic rhinitis   . Anxiety   . Chiari malformation   . DDD (degenerative disc disease)   . Diffuse cystic mastopathy   . Family history of other specified malignant neoplasm   . Headache(784.0)   . Insomnia, unspecified   . Lactose intolerance   . Mitral valve disorders(424.0)   . Osteopenia   . Seborrheic dermatitis, unspecified   . Vaginal cyst    stable   Past Surgical History:  Procedure Laterality Date  . bone spur removal  10/06   shoulder  . [place] SURGERY  2005  . Chiari Malformation surgery  6/03  . EXCISION MORTON'S NEUROMA  2/04  . LUMBAR DISC SURGERY    . TUBAL LIGATION     Social History   Tobacco Use   . Smoking status: Former Smoker    Last attempt to quit: 01/17/2001    Years since quitting: 16.5  . Smokeless tobacco: Never Used  Substance Use Topics  . Alcohol use: No    Alcohol/week: 0.0 oz  . Drug use: No   Family History  Problem Relation Age of Onset  . Melanoma Sister   . Lung cancer Father   . Coronary artery disease Mother   . Heart failure Mother   . Hypertension Mother   . Anxiety disorder Son   . Lung cancer Brother        + smoker  . Breast cancer Neg Hx    Allergies  Allergen Reactions  . Calcium     REACTION: cannot tolerate any calcium supplement - GI sympotms  . Fluticasone Propionate     REACTION: headache/irritation  . Hctz [Hydrochlorothiazide] Other (See Comments)    Eye redness and discomfort/ slt blurred vision   . Paroxetine     REACTION: abdominal pain and constipation   Current Outpatient Medications on File Prior to Visit  Medication Sig Dispense Refill  . Chlorphen-Phenyleph-Ibuprofen (ADVIL ALLERGY & CONGESTION PO) Take by mouth as needed.    . cyclobenzaprine (FLEXERIL) 10 MG tablet Take 0.5-1 tablets (5-10 mg total) by mouth 3 (three) times daily as needed for muscle spasms. 30 tablet 1  . meclizine (ANTIVERT) 25 MG tablet Take 1 tablet (25 mg total) by mouth 3 (three) times daily as needed for dizziness. 15 tablet 0  . meloxicam (MOBIC) 15 MG tablet Take 1 tablet (15 mg total) by mouth daily as needed for pain. With food (Patient not taking: Reported on 08/04/2017) 30 tablet 1  . methocarbamol (ROBAXIN) 500 MG tablet Take 1 tablet (500 mg total) by mouth 4 (four) times daily as needed for muscle spasms. Back pain (Patient not taking: Reported on 08/04/2017) 30 tablet 1   No current facility-administered medications on file prior to visit.     Review of Systems  Constitutional: Negative for activity change, appetite change, fatigue, fever and unexpected weight change.  HENT: Negative for congestion, ear pain, rhinorrhea, sinus pressure and  sore throat.   Eyes: Negative for pain, redness and visual disturbance.  Respiratory: Negative for cough, shortness of breath and wheezing.   Cardiovascular: Negative for chest pain and palpitations.  Gastrointestinal: Negative for abdominal pain, blood in stool, constipation and diarrhea.  Endocrine: Negative for polydipsia and polyuria.  Genitourinary: Negative for dysuria, frequency and  urgency.  Musculoskeletal: Negative for arthralgias, back pain and myalgias.  Skin: Negative for pallor and rash.  Allergic/Immunologic: Negative for environmental allergies.  Neurological: Negative for dizziness, syncope and headaches.  Hematological: Negative for adenopathy. Does not bruise/bleed easily.  Psychiatric/Behavioral: Negative for decreased concentration and dysphoric mood. The patient is not nervous/anxious.        Objective:   Physical Exam  Constitutional: She appears well-developed and well-nourished. No distress.  Well appearing   HENT:  Head: Normocephalic and atraumatic.  Right Ear: External ear normal.  Left Ear: External ear normal.  Mouth/Throat: Oropharynx is clear and moist.  Eyes: Pupils are equal, round, and reactive to light. Conjunctivae and EOM are normal. No scleral icterus.  Neck: Normal range of motion. Neck supple. No JVD present. Carotid bruit is not present. No thyromegaly present.  Cardiovascular: Normal rate, regular rhythm, normal heart sounds and intact distal pulses. Exam reveals no gallop.  Pulmonary/Chest: Effort normal and breath sounds normal. No respiratory distress. She has no wheezes. She exhibits no tenderness. No breast tenderness, discharge or bleeding.  Abdominal: Soft. Bowel sounds are normal. She exhibits no distension, no abdominal bruit and no mass. There is no tenderness.  Genitourinary: No breast tenderness, discharge or bleeding.  Genitourinary Comments: Breast exam: No mass, nodules, thickening, tenderness, bulging, retraction, inflamation,  nipple discharge or skin changes noted.  No axillary or clavicular LA.      Musculoskeletal: Normal range of motion. She exhibits no edema or tenderness.  Lymphadenopathy:    She has no cervical adenopathy.  Neurological: She is alert. She has normal reflexes. No cranial nerve deficit. She exhibits normal muscle tone. Coordination normal.  Skin: Skin is warm and dry. No rash noted. No erythema. No pallor.  Solar lentigines diffusely  Mildly tanned   Psychiatric: She has a normal mood and affect.  pleasant          Assessment & Plan:   Problem List Items Addressed This Visit      Cardiovascular and Mediastinum   Essential hypertension    bp in fair control at this time  BP Readings from Last 1 Encounters:  08/04/17 135/80  (better on 2nd check-she will watch at home) Given DASH eating handout  No changes needed Most recent labs reviewed  Disc lifstyle change with low sodium diet and exercise        Relevant Medications   amLODipine (NORVASC) 5 MG tablet     Musculoskeletal and Integument   Disorder of bone and cartilage    Declines further dexa at this time Enc exercise/walking  Enc better compliance with vit D No falls or fx         Other   Colon cancer screening    Declines colonoscopy  cologuard neg 7/18 -good for 3 y screen      Elevated TSH    Lab Results  Component Value Date   TSH 5.34 (H) 07/28/2017    No symptoms  Re check with FT4 in 1-2 mo      Routine general medical examination at a health care facility - Primary    Reviewed health habits including diet and exercise and skin cancer prevention Reviewed appropriate screening tests for age  Also reviewed health mt list, fam hx and immunization status , as well as social and family history   See HPI Labs rev  Declines dexa Declines shingrix vaccine  Good cholesterol Continue to watch borderline TSH Enc exercise

## 2017-08-04 NOTE — Assessment & Plan Note (Signed)
Reviewed health habits including diet and exercise and skin cancer prevention Reviewed appropriate screening tests for age  Also reviewed health mt list, fam hx and immunization status , as well as social and family history   See HPI Labs rev  Declines dexa Declines shingrix vaccine  Good cholesterol Continue to watch borderline TSH Enc exercise

## 2017-08-04 NOTE — Assessment & Plan Note (Signed)
Declines further dexa at this time Enc exercise/walking  Enc better compliance with vit D No falls or fx

## 2017-08-04 NOTE — Patient Instructions (Addendum)
Take your vitamin D every day! Important for bone health   If your blood pressure runs above 140 on top or 90 on bottom let us know   We are going to watch your thyroid function (your number is just a little off)  Schedule labs 1-2 months- we will re check it with another thyroid test   Take care of yourself  Find some indoor exercise to do when too hot or cold outside

## 2017-08-04 NOTE — Assessment & Plan Note (Signed)
Declines colonoscopy  cologuard neg 7/18 -good for 3 y screen

## 2017-08-04 NOTE — Assessment & Plan Note (Signed)
bp in fair control at this time  BP Readings from Last 1 Encounters:  08/04/17 135/80  (better on 2nd check-she will watch at home) Given DASH eating handout  No changes needed Most recent labs reviewed  Disc lifstyle change with low sodium diet and exercise

## 2017-08-04 NOTE — Assessment & Plan Note (Signed)
Lab Results  Component Value Date   TSH 5.34 (H) 07/28/2017    No symptoms  Re check with FT4 in 1-2 mo

## 2017-09-25 ENCOUNTER — Other Ambulatory Visit (INDEPENDENT_AMBULATORY_CARE_PROVIDER_SITE_OTHER): Payer: 59

## 2017-09-25 DIAGNOSIS — R7989 Other specified abnormal findings of blood chemistry: Secondary | ICD-10-CM | POA: Diagnosis not present

## 2017-09-26 LAB — TSH: TSH: 4.39 u[IU]/mL (ref 0.35–4.50)

## 2017-09-26 LAB — T4, FREE: FREE T4: 0.5 ng/dL — AB (ref 0.60–1.60)

## 2017-09-28 ENCOUNTER — Telehealth: Payer: Self-pay | Admitting: *Deleted

## 2017-09-28 MED ORDER — LEVOTHYROXINE SODIUM 50 MCG PO TABS: 50.0000 ug | ORAL_TABLET | Freq: Every day | ORAL | 3 refills | Status: DC

## 2017-09-28 NOTE — Telephone Encounter (Signed)
-----   Message from Matilde Sprang, RN sent at 09/28/2017  1:08 PM EDT ----- Pt given lab results per notes of Dr. Glori Bickers on 09/26/17. Pt verbalized understanding. She says she is having symptoms of fatigue and would like to start Levothyroxine to see if it helps. Lab appointment scheduled for Friday, October 25 at 1610. Pharmacy:[id number] [address], [place] (Phone) [phone] (Fax). Lab order will need to be placed.

## 2017-09-28 NOTE — Telephone Encounter (Signed)
Rx sent 

## 2017-11-06 ENCOUNTER — Telehealth: Payer: Self-pay | Admitting: Family Medicine

## 2017-11-06 DIAGNOSIS — E039 Hypothyroidism, unspecified: Secondary | ICD-10-CM

## 2017-11-06 NOTE — Telephone Encounter (Signed)
-----   Message from Ellamae Sia sent at 11/02/2017 10:57 AM EDT ----- Regarding: Lab orders for Friday, 10.25.19 Lab orders, no f/u appt

## 2017-11-10 ENCOUNTER — Other Ambulatory Visit: Payer: 59

## 2017-11-13 ENCOUNTER — Other Ambulatory Visit (INDEPENDENT_AMBULATORY_CARE_PROVIDER_SITE_OTHER): Payer: 59

## 2017-11-13 DIAGNOSIS — E039 Hypothyroidism, unspecified: Secondary | ICD-10-CM

## 2017-11-14 LAB — TSH: TSH: 1.53 u[IU]/mL (ref 0.35–4.50)

## 2017-11-15 ENCOUNTER — Ambulatory Visit (INDEPENDENT_AMBULATORY_CARE_PROVIDER_SITE_OTHER): Payer: 59 | Admitting: Family Medicine

## 2017-11-15 ENCOUNTER — Encounter: Payer: Self-pay | Admitting: *Deleted

## 2017-11-15 ENCOUNTER — Encounter: Payer: Self-pay | Admitting: Family Medicine

## 2017-11-15 VITALS — BP 122/70 | HR 88 | Temp 98.6°F | Ht 65.5 in | Wt 151.5 lb

## 2017-11-15 DIAGNOSIS — M25512 Pain in left shoulder: Secondary | ICD-10-CM | POA: Insufficient documentation

## 2017-11-15 DIAGNOSIS — M79602 Pain in left arm: Secondary | ICD-10-CM

## 2017-11-15 MED ORDER — PREDNISONE 10 MG PO TABS: ORAL_TABLET | ORAL | 0 refills | Status: DC

## 2017-11-15 NOTE — Assessment & Plan Note (Signed)
Suspect shoulder tendonitis from overuse  Pain is radiating to mid arm  tx with prednisone taper Ice/heat  rom exercises- given handout  Relative rest  Update if not starting to improve in a week or if worsening   Consider further eval or PT if no imp

## 2017-11-15 NOTE — Patient Instructions (Addendum)
Take prednisone for arm inflammation and pain  Tylenol is ok also  Don't take ibuprofen Ice whenever you can (elbow or shoulder or both)   Avoid heavy lifting /pushing  Try the range of motion exercises   Do not sleep on left arm/shoulder   In 7-10 days update [place] -if not improved we may refer you to a specialist

## 2017-11-15 NOTE — Progress Notes (Signed)
Subjective:    Patient ID: Denise Grant, female    DOB: Oct 10, 1955, 62 y.o.   MRN: [id number]  HPI  Here for left arm pain   Used ice and heat and ibuprofen   Started about 3 weeks ago  No new activity /injury or trauma  Pain is around elbow- shoots up to shoulder when she raises arm   Burning/stinging pain  By end of the day- a bit in her hand  Middle of arm-not med or lateral   Worse to raise or abduct arm  Ok to bend elbow and wrist  A little worse to grip   No swelling or bruising now  ? If a little medial elbow swelling several days ago   A lot of reped movement with work  Is R handed    Patient Active Problem List   Diagnosis Date Noted  . Pain of left upper extremity 11/15/2017  . Essential hypertension 01/01/2015  . Hypothyroidism 06/25/2014  . Migraine with vertigo 05/21/2014  . History of Chiari malformation 05/21/2014  . Atrophic vaginitis 05/01/2013  . Colon cancer screening 04/20/2012  . Encounter for routine gynecological examination 11/24/2010  . Routine general medical examination at a health care facility 11/18/2010  . INSOMNIA 01/27/2010  . HEADACHE 03/31/2008  . Disorder of bone and cartilage 09/18/2007  . Generalized anxiety disorder 05/05/2006  . MITRAL VALVE PROLAPSE 05/05/2006  . ALLERGIC RHINITIS 05/05/2006  . FIBROCYSTIC BREAST DISEASE 05/05/2006  . SEBORRHEIC DERMATITIS 05/05/2006  . KERATOSIS, ACTINIC 05/05/2006  . NECK PAIN, CHRONIC 05/05/2006   Past Medical History:  Diagnosis Date  . Actinic keratosis   . Allergic rhinitis   . Anxiety   . Chiari malformation   . DDD (degenerative disc disease)   . Diffuse cystic mastopathy   . Family history of other specified malignant neoplasm   . Headache(784.0)   . Insomnia, unspecified   . Lactose intolerance   . Mitral valve disorders(424.0)   . Osteopenia   . Seborrheic dermatitis, unspecified   . Vaginal cyst    stable   Past Surgical History:  Procedure Laterality Date    . bone spur removal  10/06   shoulder  . Hiram SURGERY  2005  . Chiari Malformation surgery  6/03  . EXCISION MORTON'S NEUROMA  2/04  . LUMBAR DISC SURGERY    . TUBAL LIGATION     Social History   Tobacco Use  . Smoking status: Former Smoker    Last attempt to quit: 01/17/2001    Years since quitting: 16.8  . Smokeless tobacco: Never Used  Substance Use Topics  . Alcohol use: No    Alcohol/week: 0.0 standard drinks  . Drug use: No   Family History  Problem Relation Age of Onset  . Melanoma Sister   . Lung cancer Father   . Coronary artery disease Mother   . Heart failure Mother   . Hypertension Mother   . Anxiety disorder Son   . Lung cancer Brother        + smoker  . Breast cancer Neg Hx    Allergies  Allergen Reactions  . Calcium     REACTION: cannot tolerate any calcium supplement - GI sympotms  . Fluticasone Propionate     REACTION: headache/irritation  . Hctz [Hydrochlorothiazide] Other (See Comments)    Eye redness and discomfort/ slt blurred vision   . Paroxetine     REACTION: abdominal pain and constipation   Current Outpatient Medications on  File Prior to Visit  Medication Sig Dispense Refill  . amLODipine (NORVASC) 5 MG tablet Take 1 tablet (5 mg total) by mouth daily. 90 tablet 3  . Chlorphen-Phenyleph-Ibuprofen (ADVIL ALLERGY & CONGESTION PO) Take by mouth as needed.    . cyclobenzaprine (FLEXERIL) 10 MG tablet Take 0.5-1 tablets (5-10 mg total) by mouth 3 (three) times daily as needed for muscle spasms. 30 tablet 1  . levothyroxine (SYNTHROID, LEVOTHROID) 50 MCG tablet Take 1 tablet (50 mcg total) by mouth daily before breakfast. 30 tablet 3  . LORazepam (ATIVAN) 1 MG tablet TAKE 1/2 (ONE-HALF) TABLET BY MOUTH AT BEDTIME AS NEEDED 30 tablet 2  . meclizine (ANTIVERT) 25 MG tablet Take 1 tablet (25 mg total) by mouth 3 (three) times daily as needed for dizziness. 15 tablet 0   No current facility-administered medications on file prior to visit.      Review of Systems  Constitutional: Negative for activity change, appetite change, fatigue, fever and unexpected weight change.  HENT: Negative for congestion, ear pain, rhinorrhea, sinus pressure and sore throat.   Eyes: Negative for pain, redness and visual disturbance.  Respiratory: Negative for cough, shortness of breath and wheezing.   Cardiovascular: Negative for chest pain and palpitations.  Gastrointestinal: Negative for abdominal pain, blood in stool, constipation and diarrhea.  Endocrine: Negative for polydipsia and polyuria.  Genitourinary: Negative for dysuria, frequency and urgency.  Musculoskeletal: Negative for arthralgias, back pain, joint swelling and myalgias.       R arm and shoulder pain   Skin: Negative for pallor and rash.  Allergic/Immunologic: Negative for environmental allergies.  Neurological: Negative for dizziness, syncope and headaches.  Hematological: Negative for adenopathy. Does not bruise/bleed easily.  Psychiatric/Behavioral: Negative for decreased concentration and dysphoric mood. The patient is not nervous/anxious.        Objective:   Physical Exam  Constitutional: She appears well-developed and well-nourished. No distress.  Well appearing   Eyes: Pupils are equal, round, and reactive to light. Conjunctivae and EOM are normal.  Neck: Normal range of motion. Neck supple.  Cardiovascular: Normal heart sounds.  Pulmonary/Chest: Effort normal and breath sounds normal. No stridor. No respiratory distress. She has no wheezes.  Musculoskeletal:       Left shoulder: She exhibits decreased range of motion, tenderness, bony tenderness and spasm. She exhibits no swelling, no effusion, no crepitus, normal pulse and normal strength.  L shoulder Acromion tenderness/ mild bicep tendon tenderness Pos Hawkings test  equivocal Neer test  Limited ext rotation due to pain   No epicondylar tenderness Nl rom elbow and wrist    Lymphadenopathy:    She has no  cervical adenopathy.  Neurological: She is alert. She displays normal reflexes. No sensory deficit. She exhibits normal muscle tone. Coordination normal.          Assessment & Plan:   Problem List Items Addressed This Visit      Other   Pain of left upper extremity - Primary    Suspect shoulder tendonitis from overuse  Pain is radiating to mid arm  tx with prednisone taper Ice/heat  rom exercises- given handout  Relative rest  Update if not starting to improve in a week or if worsening   Consider further eval or PT if no imp

## 2017-11-21 ENCOUNTER — Other Ambulatory Visit: Payer: Self-pay | Admitting: *Deleted

## 2017-11-21 MED ORDER — LEVOTHYROXINE SODIUM 50 MCG PO TABS: 50.0000 ug | ORAL_TABLET | Freq: Every day | ORAL | 1 refills | Status: DC

## 2017-12-11 ENCOUNTER — Telehealth: Payer: Self-pay | Admitting: Family Medicine

## 2017-12-11 DIAGNOSIS — M25512 Pain in left shoulder: Principal | ICD-10-CM

## 2017-12-11 DIAGNOSIS — G8929 Other chronic pain: Secondary | ICD-10-CM

## 2017-12-11 NOTE — Telephone Encounter (Signed)
Pt notified referral done and our [facility] will call to schedule appt 

## 2017-12-11 NOTE — Telephone Encounter (Signed)
Pt called office stating her arm is not getting any better. The prednisone that was prescribed is not helping. Pt says she would like to go to an orthopaedist.

## 2017-12-11 NOTE — Telephone Encounter (Signed)
Referral done Let her know our office will call her

## 2018-01-29 ENCOUNTER — Other Ambulatory Visit: Payer: Self-pay | Admitting: Orthopedic Surgery

## 2018-01-29 DIAGNOSIS — M25512 Pain in left shoulder: Secondary | ICD-10-CM

## 2018-02-05 ENCOUNTER — Other Ambulatory Visit: Payer: Self-pay | Admitting: Orthopedic Surgery

## 2018-02-05 DIAGNOSIS — M542 Cervicalgia: Secondary | ICD-10-CM

## 2018-02-11 ENCOUNTER — Ambulatory Visit
Admission: RE | Admit: 2018-02-11 | Discharge: 2018-02-11 | Disposition: A | Discharge status: Home / Self Care | Payer: 59 | Source: Ambulatory Visit | Attending: Orthopedic Surgery | Admitting: Orthopedic Surgery

## 2018-02-11 DIAGNOSIS — M542 Cervicalgia: Secondary | ICD-10-CM

## 2018-02-11 DIAGNOSIS — M25512 Pain in left shoulder: Secondary | ICD-10-CM

## 2018-02-11 IMAGING — MR MR SHOULDER*L* W/O CM
4 of 5 series · 21 of 40 positions shown · non-contrast
Comparison: None.

CLINICAL DATA: Chronic left shoulder pain.  No specific injury.

EXAM:
MRI OF THE LEFT SHOULDER WITHOUT CONTRAST
TECHNIQUE: Multiplanar, multisequence MR imaging of the shoulder was performed.
No intravenous contrast was administered.

[Series 6: PD fat-sat · axial · left · 4.0mm · 0.44mm/px · z∈[-58,+57]mm · 8 of 25 slices shown (1 of 2)]
[im 1/25]
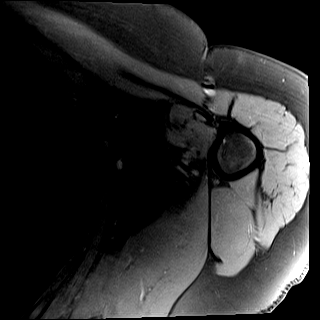
[im 4/25]
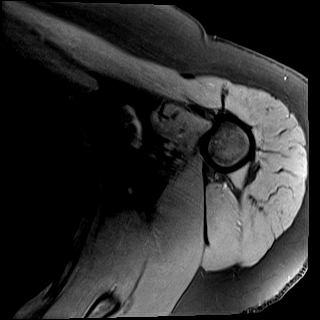
[im 7/25]
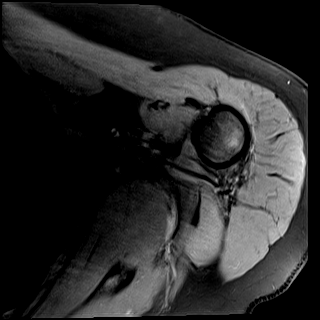
[im 11/25]
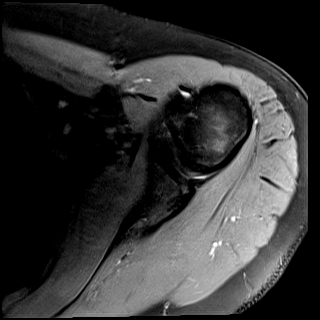
[im 14/25]
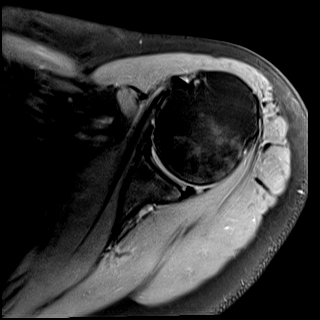
[im 18/25]
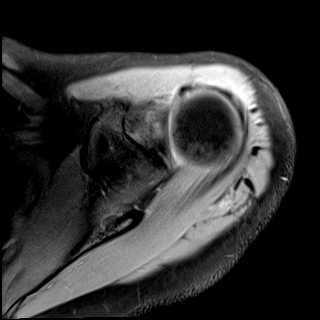
[im 21/25]
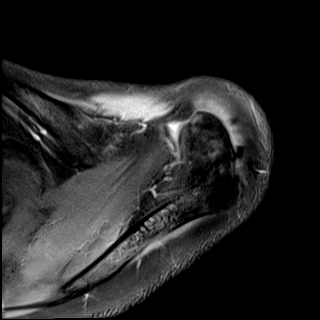
[im 25/25]
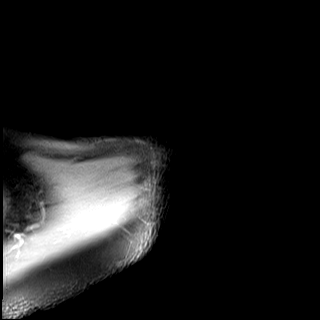

[Series 7: T2 fat-sat · oblique · left · 4.0mm · 0.22mm/px · 3 of 24 slices shown (1 of 2)]
[im 4/24]
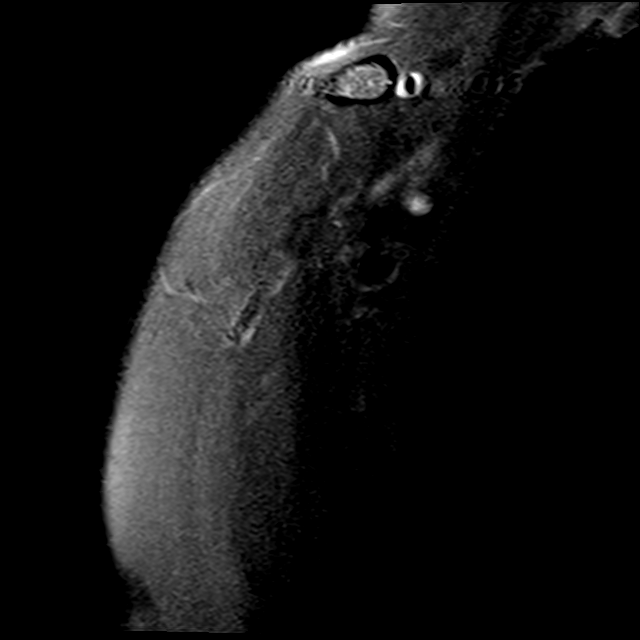
[im 14/24]
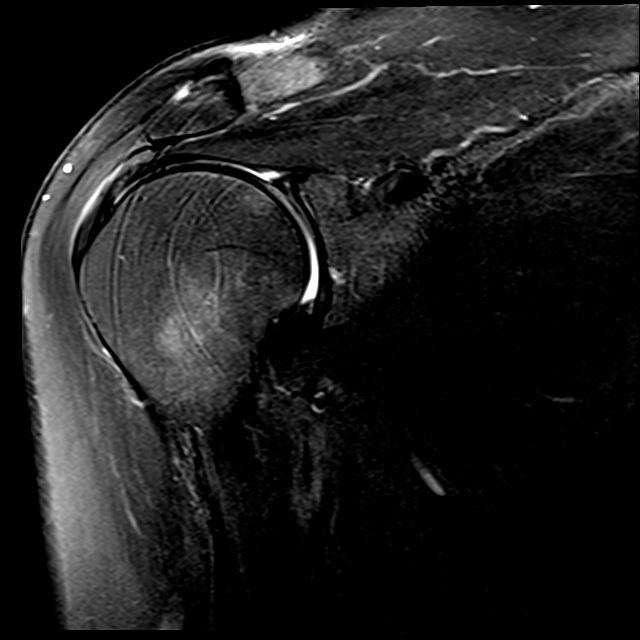
[im 20/24]
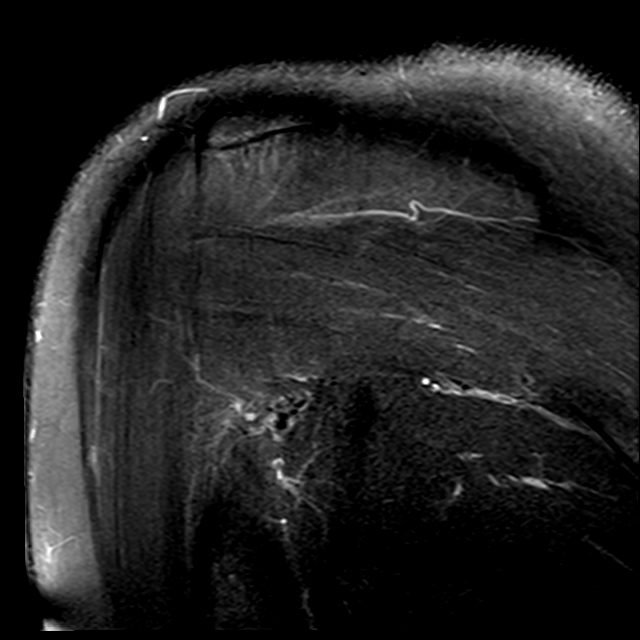

[Series 8: PD fat-sat · oblique · left · 4.0mm · 0.22mm/px · 7 of 24 slices shown (2 of 2)]
[im 1/24]
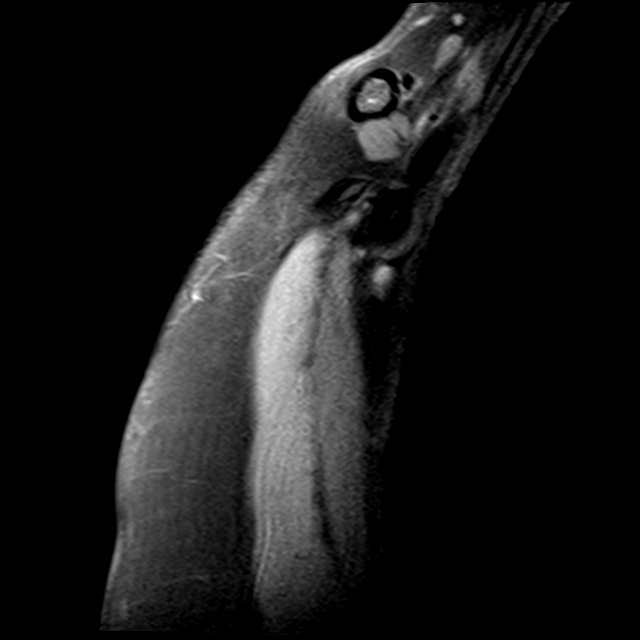
[im 4/24]
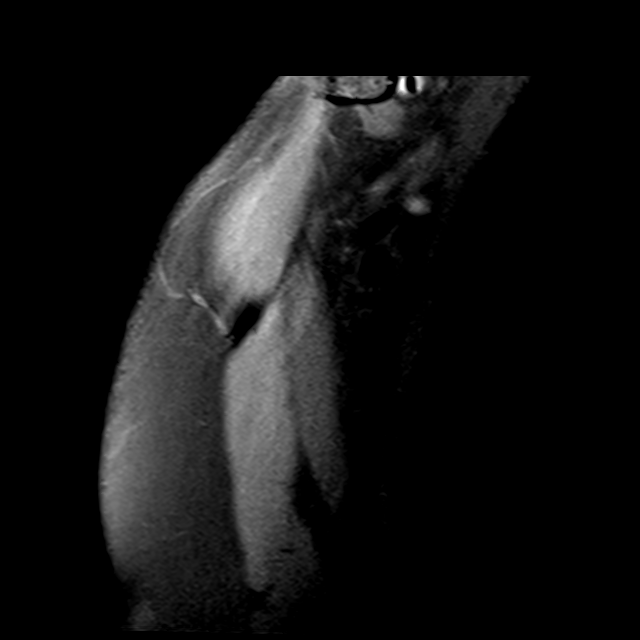
[im 7/24]
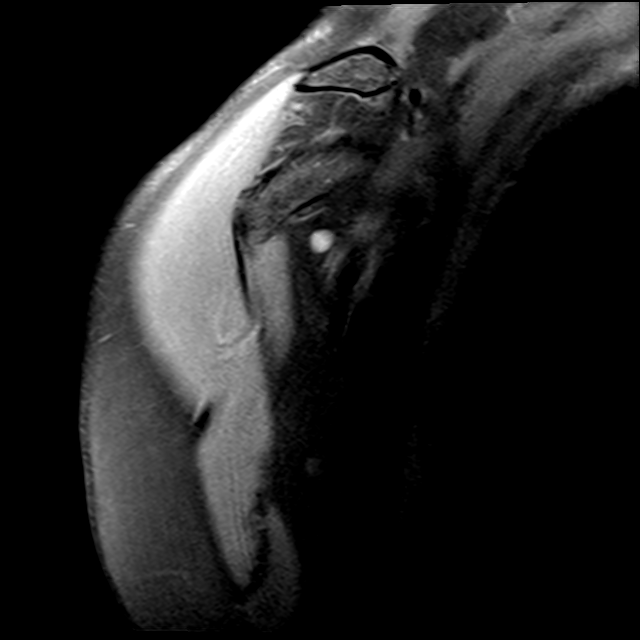
[im 10/24]
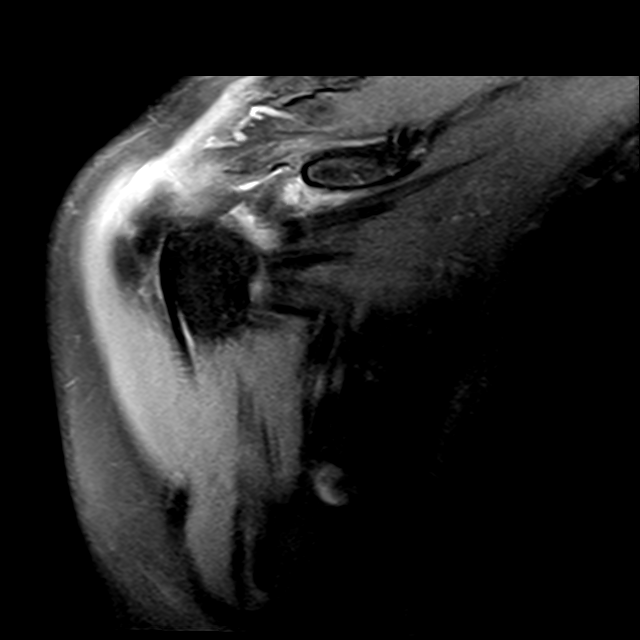
[im 14/24]
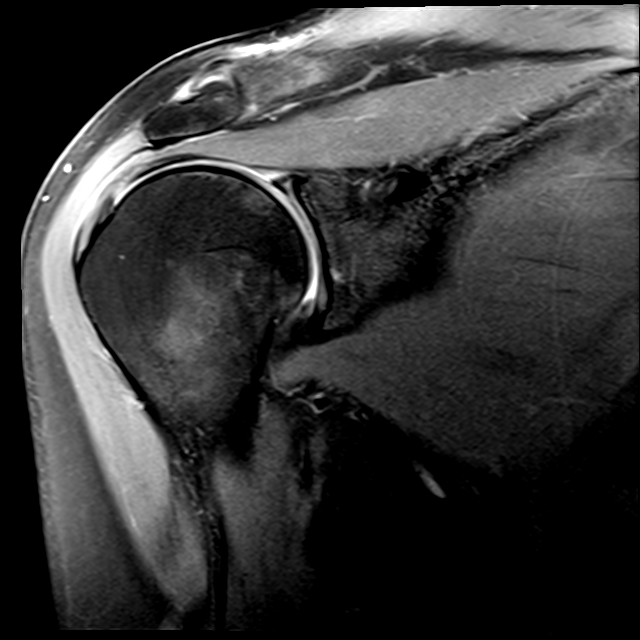
[im 17/24]
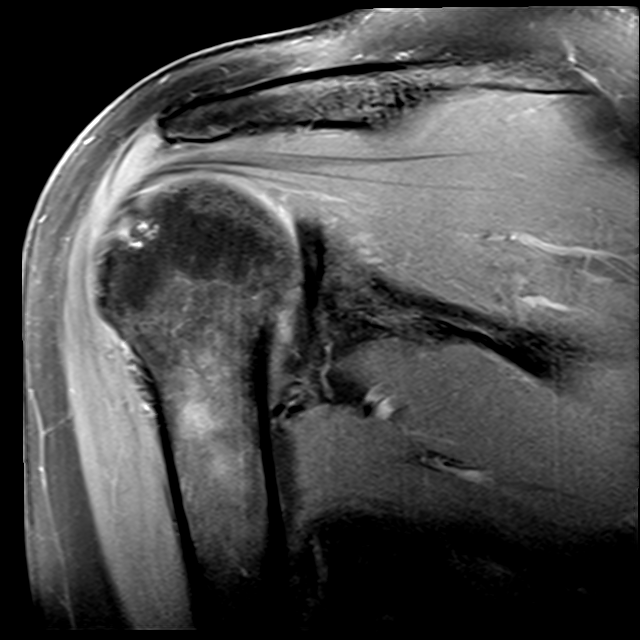
[im 20/24]
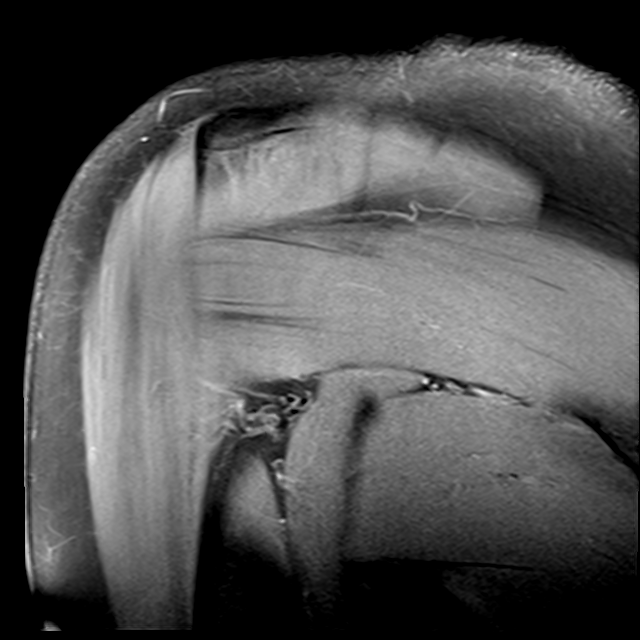

[Series 12: T2 fat-sat · oblique · left · 4.0mm · 0.44mm/px · 3 of 23 slices shown (2 of 2)]
[im 4/23]
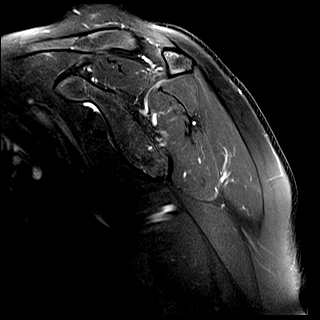
[im 13/23]
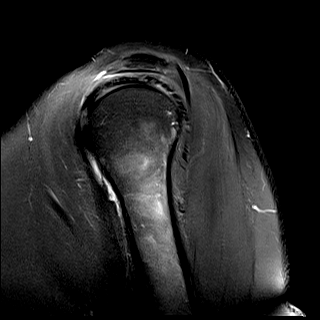
[im 19/23]
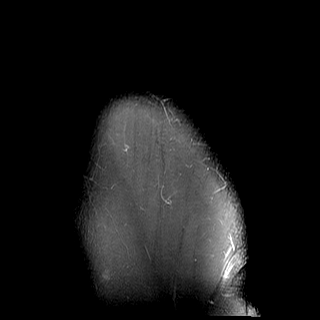

[21 of 40 positions shown; findings below may reference images not displayed]

FINDINGS: Rotator cuff: Moderate supraspinatus tendinopathy/tendinosis with
bursal surface fraying and fibrillation and possible shallow bursal
surface tears. No full-thickness retracted tear. Mild tendinopathy
involving the supraspinatus and infraspinatus tendons with small
interstitial tears.

Muscles:  Normal

Biceps long head:  Intact

Acromioclavicular Joint: No degenerative changes. Type 1-2 acromion.
Mild lateral downsloping but no undersurface spurring.

Glenohumeral Joint: No significant degenerative changes. There is a
small joint effusion and moderate synovitis versus adhesive
capsulitis.

Labrum:  No labral tears are identified.

Bones:  No acute bony findings.

Other: Minimal subacromial/subdeltoid bursitis.
IMPRESSION: 1. Moderate supraspinatus tendinopathy/tendinosis with bursal
surface fraying and fibrillation anteriorly and possible shallow
bursal surface tears. No full-thickness retracted rotator cuff tear.
2. Intact long head biceps tendon and glenoid labrum.
3. Mild lateral downsloping of a type 1-2 acromion.
4. Glenohumeral synovitis versus adhesive capsulitis.

## 2018-02-11 IMAGING — MR MR CERVICAL SPINE W/O CM
4 of 5 series · 25 of 48 positions shown · non-contrast
Comparison: [DATE] MRI. [DATE] radiography. [DATE] MRI.

CLINICAL DATA: Chronic neck pain. Previous cervical fusion surgery.
Previous Chiari surgery. Bilateral hand numbness.

EXAM:
MRI CERVICAL SPINE WITHOUT CONTRAST
TECHNIQUE: Multiplanar, multisequence MR imaging of the cervical spine was
performed. No intravenous contrast was administered.

[Series 6: T1 · sagittal · 3.2mm · 0.66mm/px · 6 of 13 slices shown (1 of 2)]
[im 1/13]
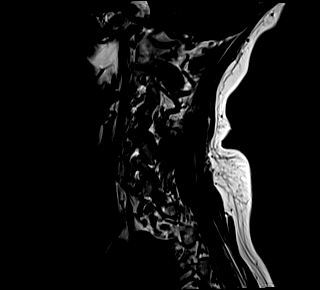
[im 3/13]
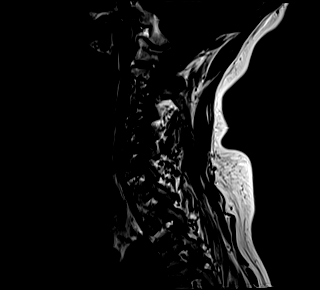
[im 5/13]
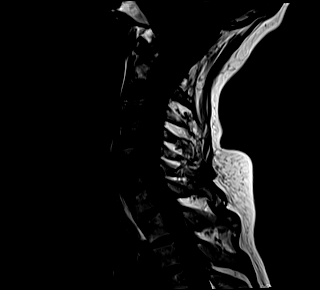
[im 8/13]
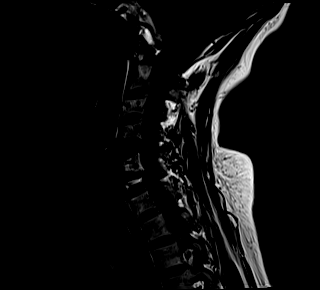
[im 10/13]
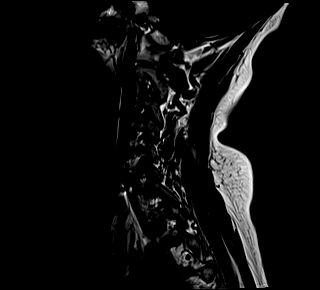
[im 13/13]
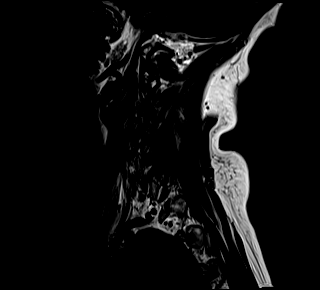

[Series 7: T2 · sagittal · 3.2mm · 0.66mm/px · 7 of 13 slices shown]
[im 1/13]
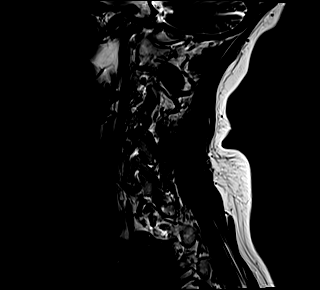
[im 3/13]
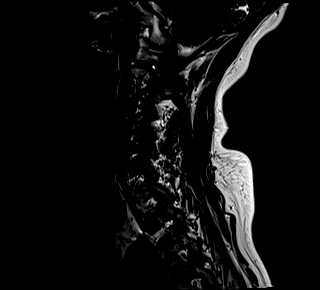
[im 5/13]
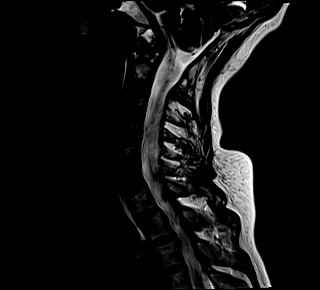
[im 7/13]
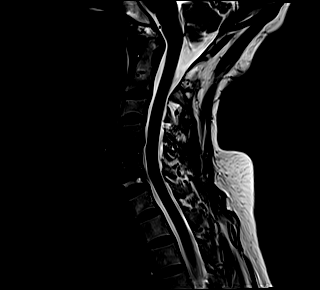
[im 9/13]
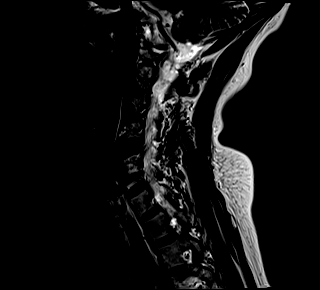
[im 11/13]
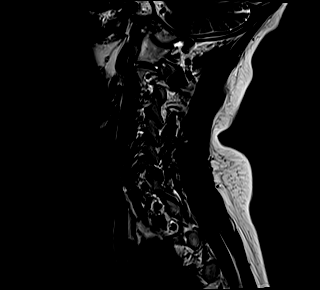
[im 13/13]
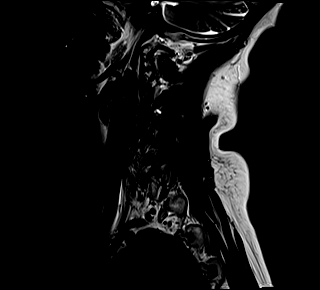

[Series 8: STIR · sagittal · 3.2mm · 0.33mm/px · 4 of 13 slices shown]
[im 1/13]
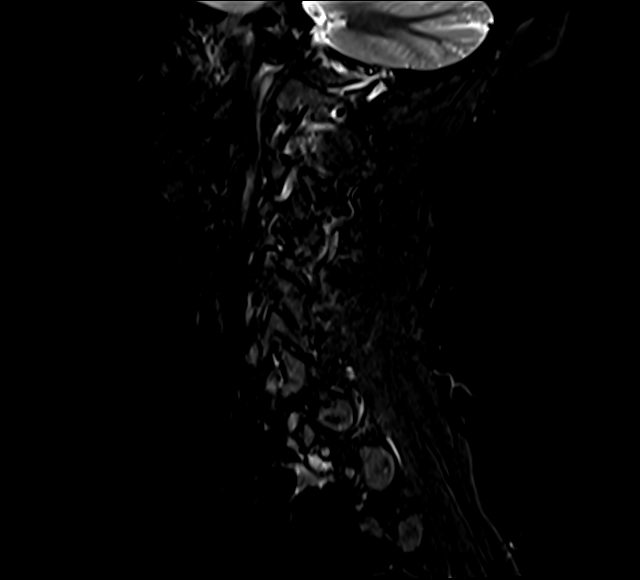
[im 3/13]
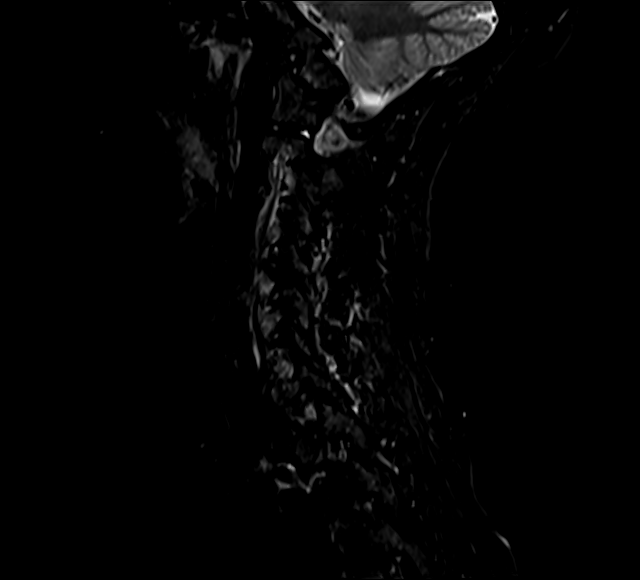
[im 7/13]
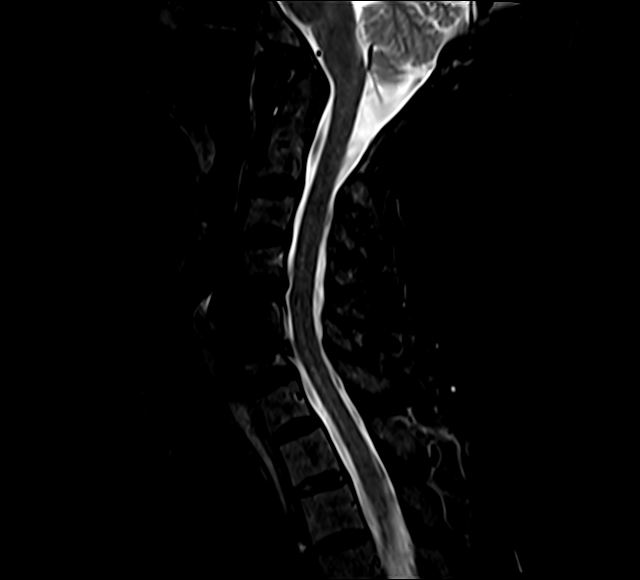
[im 11/13]
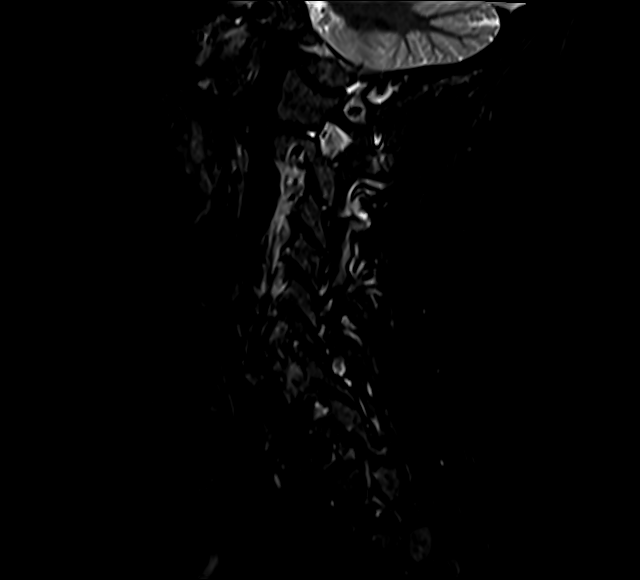

[Series 11: T1 · axial · non-contrast · 3.0mm · 0.31mm/px · z∈[-79,+10]mm · 8 of 26 slices shown (2 of 2)]
[im 1/26]
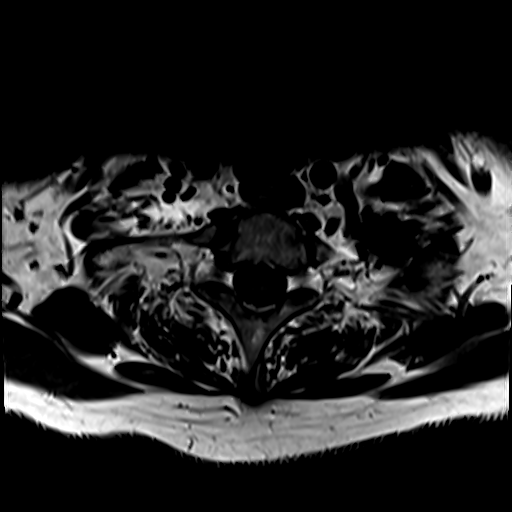
[im 4/26]
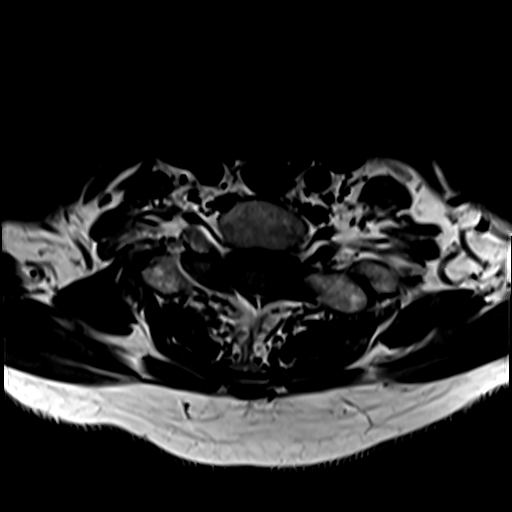
[im 8/26]
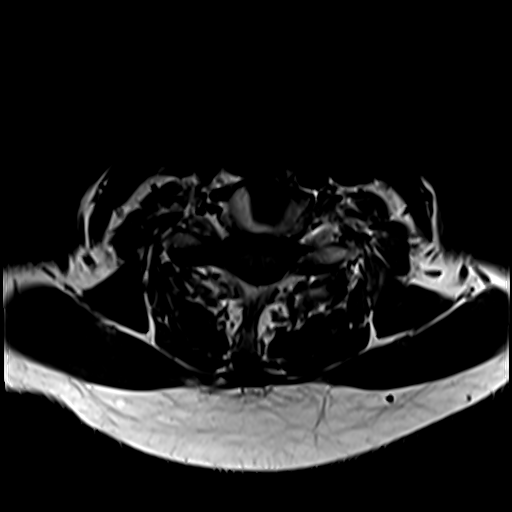
[im 12/26]
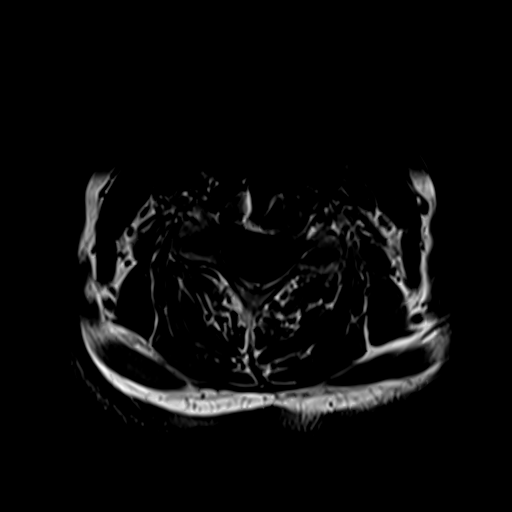
[im 14/26]
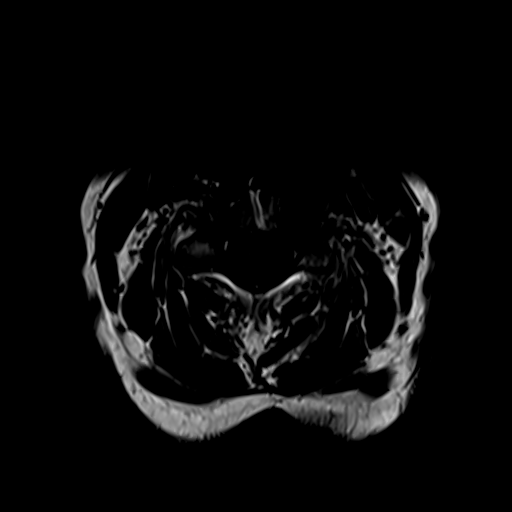
[im 18/26]
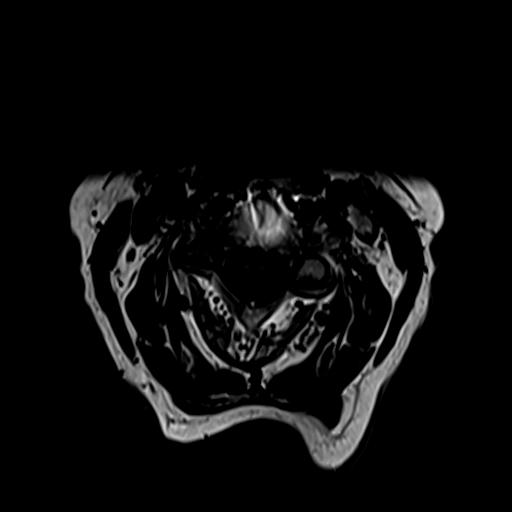
[im 22/26]
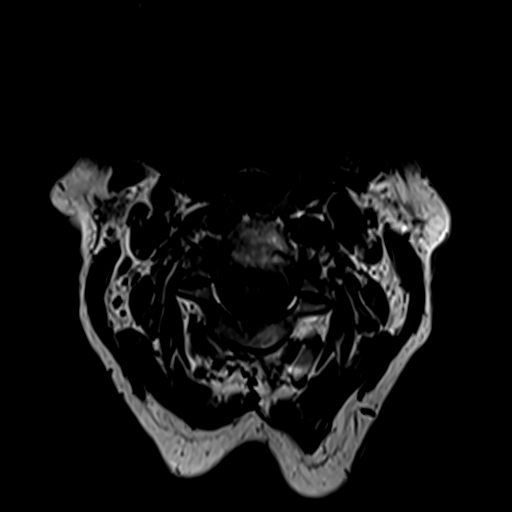
[im 26/26]
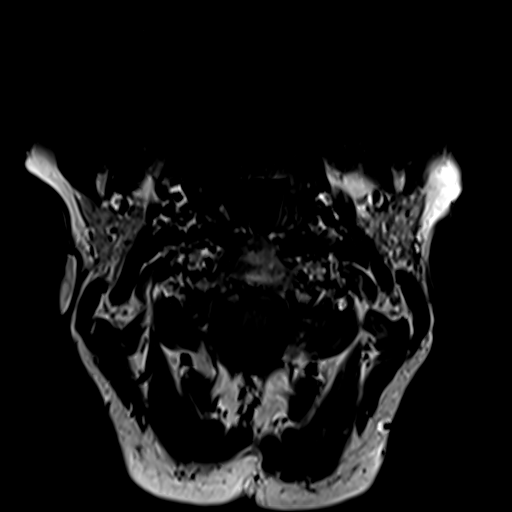

[25 of 48 positions shown; findings below may reference images not displayed]

FINDINGS: Alignment: Normal

Vertebrae: Previous suboccipital craniectomy and C1 laminectomy.
Previous ACDF C5-6.

Cord: No cord compression or primary cord lesion.  No syrinx.

Posterior Fossa, vertebral arteries, paraspinal tissues: Previous
suboccipital craniectomy and C1 laminectomy as noted previously for
treatment of Chiari malformation.

Disc levels:

C2-3: Normal interspace.

C3-4: Tiny central disc bulge.  No stenosis.

C4-5: Endplate osteophytes and mild bulging of the disc. No
significant canal or foraminal narrowing.

C5-6: Previous ACDF has a good appearance with wide patency of the
canal and foramina.

C6-7: Minimal disc bulge.  No canal or foraminal stenosis.

C7-T1: Normal interspace.

Upper thoracic region normal.
IMPRESSION: No significant finding presently. Previous C5-6 ACDF has a good
appearance. Minimal non-compressive degenerative changes at C3-4,
C4-5 and C6-7.

Previous suboccipital craniectomy and L1 laminectomy for treatment
of Chiari malformation.

## 2018-02-27 ENCOUNTER — Other Ambulatory Visit: Payer: Self-pay | Admitting: Family Medicine

## 2018-02-27 NOTE — Telephone Encounter (Signed)
Name of Medication: Ativan Name of [facility]acy: [facility] Last Fill or Written Date and Quantity: 08/04/17 #30 tabs with 2 refills Last Office Visit and Type: CPE on 08/04/17 Next Office Visit and Type: CPE on 08/24/18 Last Controlled Substance Agreement Date: 01/28/15 Last UDS:01/28/15

## 2018-05-18 ENCOUNTER — Other Ambulatory Visit: Payer: Self-pay | Admitting: Family Medicine

## 2018-07-02 ENCOUNTER — Other Ambulatory Visit: Payer: Self-pay | Admitting: Family Medicine

## 2018-07-02 DIAGNOSIS — Z1231 Encounter for screening mammogram for malignant neoplasm of breast: Secondary | ICD-10-CM

## 2018-07-13 ENCOUNTER — Other Ambulatory Visit: Payer: Self-pay | Admitting: Family Medicine

## 2018-07-13 NOTE — Telephone Encounter (Signed)
Name of Medication: ativan Name of Pharmacy: [facility] [address] Last Fill or Written Date and Quantity: 02/27/18 #15 tabs with 3 refills Last Office Visit and Type: arm pain 11/15/17 Next Office Visit and Type: CPE 08/24/18

## 2018-07-25 ENCOUNTER — Ambulatory Visit (HOSPITAL_COMMUNITY)
Admission: RE | Admit: 2018-07-25 | Discharge: 2018-07-25 | Disposition: A | Discharge status: Home / Self Care | Payer: 59 | Source: Ambulatory Visit | Attending: Ophthalmology | Admitting: Ophthalmology

## 2018-07-25 ENCOUNTER — Ambulatory Visit (HOSPITAL_COMMUNITY): Payer: 59 | Admitting: Certified Registered"

## 2018-07-25 ENCOUNTER — Other Ambulatory Visit: Payer: Self-pay

## 2018-07-25 ENCOUNTER — Encounter (HOSPITAL_COMMUNITY)
Admission: RE | Disposition: A | Discharge status: Home / Self Care | Payer: Self-pay | Source: Ambulatory Visit | Attending: Ophthalmology

## 2018-07-25 ENCOUNTER — Encounter (HOSPITAL_COMMUNITY): Payer: Self-pay | Admitting: Orthopedic Surgery

## 2018-07-25 DIAGNOSIS — F419 Anxiety disorder, unspecified: Secondary | ICD-10-CM | POA: Diagnosis not present

## 2018-07-25 DIAGNOSIS — Z1159 Encounter for screening for other viral diseases: Secondary | ICD-10-CM | POA: Insufficient documentation

## 2018-07-25 DIAGNOSIS — H3321 Serous retinal detachment, right eye: Secondary | ICD-10-CM | POA: Insufficient documentation

## 2018-07-25 DIAGNOSIS — Z79899 Other long term (current) drug therapy: Secondary | ICD-10-CM | POA: Insufficient documentation

## 2018-07-25 DIAGNOSIS — J309 Allergic rhinitis, unspecified: Secondary | ICD-10-CM | POA: Diagnosis not present

## 2018-07-25 HISTORY — PX: REPAIR OF COMPLEX TRACTION RETINAL DETACHMENT: SHX6217

## 2018-07-25 HISTORY — PX: AIR/FLUID EXCHANGE: SHX6494

## 2018-07-25 HISTORY — PX: GAS INSERTION: SHX5336

## 2018-07-25 LAB — SARS CORONAVIRUS 2 BY RT PCR (HOSPITAL ORDER, PERFORMED IN ~~LOC~~ HOSPITAL LAB): SARS Coronavirus 2: NEGATIVE

## 2018-07-25 SURGERY — REPAIR, RETINAL DETACHMENT, COMPLEX
Anesthesia: Monitor Anesthesia Care | Site: Eye | Laterality: Right

## 2018-07-25 MED ORDER — SCOPOLAMINE 1 MG/3DAYS TD PT72
MEDICATED_PATCH | TRANSDERMAL | Status: AC
  Administered 2018-07-25: 1.5 mg via TRANSDERMAL
  Filled 2018-07-25: qty 1

## 2018-07-25 MED ORDER — BSS IO SOLN
INTRAOCULAR | Status: AC
  Filled 2018-07-25: qty 15

## 2018-07-25 MED ORDER — INDOCYANINE GREEN 25 MG IV SOLR
INTRAVENOUS | Status: AC
  Filled 2018-07-25: qty 25

## 2018-07-25 MED ORDER — EPINEPHRINE PF 1 MG/ML IJ SOLN
INTRAMUSCULAR | Status: AC
  Filled 2018-07-25: qty 1

## 2018-07-25 MED ORDER — CYCLOPENTOLATE HCL 1 % OP SOLN
1.0000 [drp] | OPHTHALMIC | Status: AC | PRN
  Administered 2018-07-25 (×3): 1 [drp] via OPHTHALMIC
  Filled 2018-07-25: qty 2

## 2018-07-25 MED ORDER — DEXAMETHASONE SODIUM PHOSPHATE 10 MG/ML IJ SOLN
INTRAMUSCULAR | Status: DC | PRN
  Administered 2018-07-25: 10 mg

## 2018-07-25 MED ORDER — MIDAZOLAM HCL 2 MG/2ML IJ SOLN
INTRAMUSCULAR | Status: AC
  Filled 2018-07-25: qty 2

## 2018-07-25 MED ORDER — BSS IO SOLN
INTRAOCULAR | Status: DC | PRN
  Administered 2018-07-25: 15 mL via INTRAOCULAR

## 2018-07-25 MED ORDER — BSS PLUS IO SOLN
INTRAOCULAR | Status: DC | PRN
  Administered 2018-07-25: 1

## 2018-07-25 MED ORDER — ONDANSETRON HCL 4 MG/2ML IJ SOLN
INTRAMUSCULAR | Status: AC
  Filled 2018-07-25: qty 2

## 2018-07-25 MED ORDER — TROPICAMIDE 1 % OP SOLN
1.0000 [drp] | OPHTHALMIC | Status: AC | PRN
  Administered 2018-07-25 (×3): 1 [drp] via OPHTHALMIC
  Filled 2018-07-25: qty 15

## 2018-07-25 MED ORDER — ACETAMINOPHEN 500 MG PO TABS
1000.0000 mg | ORAL_TABLET | Freq: Once | ORAL | Status: AC
  Administered 2018-07-25: 1000 mg via ORAL
  Filled 2018-07-25: qty 2

## 2018-07-25 MED ORDER — SODIUM CHLORIDE 0.9 % IV SOLN: INTRAVENOUS | Status: DC

## 2018-07-25 MED ORDER — HYALURONIDASE HUMAN NICU 150 UNIT/ML INJECTION
INTRAMUSCULAR | Status: DC | PRN
  Administered 2018-07-25: 150 [IU]

## 2018-07-25 MED ORDER — ONDANSETRON HCL 4 MG/2ML IJ SOLN
INTRAMUSCULAR | Status: DC | PRN
  Administered 2018-07-25: 4 mg via INTRAVENOUS

## 2018-07-25 MED ORDER — BSS PLUS IO SOLN
INTRAOCULAR | Status: AC
  Filled 2018-07-25: qty 500

## 2018-07-25 MED ORDER — SODIUM CHLORIDE 0.9 % IV SOLN
INTRAVENOUS | Status: DC | PRN
  Administered 2018-07-25: 20:00:00 via INTRAVENOUS

## 2018-07-25 MED ORDER — CEFAZOLIN SUBCONJUNCTIVAL INJECTION 100 MG/0.5 ML
INJECTION | SUBCONJUNCTIVAL | Status: DC | PRN
  Administered 2018-07-25: 100 mg via SUBCONJUNCTIVAL

## 2018-07-25 MED ORDER — PROPOFOL 10 MG/ML IV BOLUS
INTRAVENOUS | Status: AC
  Filled 2018-07-25: qty 20

## 2018-07-25 MED ORDER — PROPOFOL 10 MG/ML IV BOLUS
INTRAVENOUS | Status: DC | PRN
  Administered 2018-07-25: 30 mg via INTRAVENOUS

## 2018-07-25 MED ORDER — LIDOCAINE HCL 2 % IJ SOLN
INTRAMUSCULAR | Status: AC
  Filled 2018-07-25: qty 20

## 2018-07-25 MED ORDER — ATROPINE SULFATE 1 % OP SOLN
OPHTHALMIC | Status: AC
  Filled 2018-07-25: qty 5

## 2018-07-25 MED ORDER — TOBRAMYCIN-DEXAMETHASONE 0.3-0.1 % OP OINT
TOPICAL_OINTMENT | OPHTHALMIC | Status: DC | PRN
  Administered 2018-07-25: 1 via OPHTHALMIC

## 2018-07-25 MED ORDER — HYPROMELLOSE (GONIOSCOPIC) 2.5 % OP SOLN
OPHTHALMIC | Status: AC
  Filled 2018-07-25: qty 15

## 2018-07-25 MED ORDER — TETRACAINE HCL 0.5 % OP SOLN
OPHTHALMIC | Status: AC
  Filled 2018-07-25: qty 4

## 2018-07-25 MED ORDER — LIDOCAINE HCL 2 % IJ SOLN
INTRAMUSCULAR | Status: DC | PRN
  Administered 2018-07-25: 7.5 mL via RETROBULBAR

## 2018-07-25 MED ORDER — ACETAMINOPHEN 500 MG PO TABS
ORAL_TABLET | ORAL | Status: AC
  Administered 2018-07-25: 1000 mg via ORAL
  Filled 2018-07-25: qty 2

## 2018-07-25 MED ORDER — SCOPOLAMINE 1 MG/3DAYS TD PT72
1.0000 | MEDICATED_PATCH | TRANSDERMAL | Status: DC
  Administered 2018-07-25: 1.5 mg via TRANSDERMAL
  Filled 2018-07-25: qty 1

## 2018-07-25 MED ORDER — HYPROMELLOSE (GONIOSCOPIC) 2.5 % OP SOLN
OPHTHALMIC | Status: DC | PRN
  Administered 2018-07-25: 2 [drp] via OPHTHALMIC

## 2018-07-25 MED ORDER — SODIUM CHLORIDE 0.9 % IV SOLN
INTRAVENOUS | Status: DC
  Administered 2018-07-25: 19:00:00 via INTRAVENOUS

## 2018-07-25 MED ORDER — LIDOCAINE HCL (CARDIAC) PF 100 MG/5ML IV SOSY
PREFILLED_SYRINGE | INTRAVENOUS | Status: DC | PRN
  Administered 2018-07-25: 50 mg via INTRATRACHEAL

## 2018-07-25 MED ORDER — EPINEPHRINE PF 1 MG/ML IJ SOLN
INTRAOCULAR | Status: DC | PRN
  Administered 2018-07-25: 500 mL

## 2018-07-25 MED ORDER — DEXAMETHASONE SODIUM PHOSPHATE 10 MG/ML IJ SOLN
INTRAMUSCULAR | Status: AC
  Filled 2018-07-25: qty 1

## 2018-07-25 MED ORDER — PROMETHAZINE HCL 25 MG/ML IJ SOLN: 6.2500 mg | INTRAMUSCULAR | Status: DC | PRN

## 2018-07-25 MED ORDER — FENTANYL CITRATE (PF) 100 MCG/2ML IJ SOLN: 25.0000 ug | INTRAMUSCULAR | Status: DC | PRN

## 2018-07-25 MED ORDER — MIDAZOLAM HCL 2 MG/2ML IJ SOLN
INTRAMUSCULAR | Status: DC | PRN
  Administered 2018-07-25: 2 mg via INTRAVENOUS

## 2018-07-25 MED ORDER — TOBRAMYCIN-DEXAMETHASONE 0.3-0.1 % OP OINT
TOPICAL_OINTMENT | OPHTHALMIC | Status: AC
  Filled 2018-07-25: qty 3.5

## 2018-07-25 MED ORDER — DEXAMETHASONE SODIUM PHOSPHATE 10 MG/ML IJ SOLN
INTRAMUSCULAR | Status: DC | PRN
  Administered 2018-07-25: 10 mg via INTRAVENOUS

## 2018-07-25 MED ORDER — BUPIVACAINE HCL (PF) 0.75 % IJ SOLN
INTRAMUSCULAR | Status: AC
  Filled 2018-07-25: qty 10

## 2018-07-25 MED ORDER — OFLOXACIN 0.3 % OP SOLN
1.0000 [drp] | OPHTHALMIC | Status: AC | PRN
  Administered 2018-07-25 (×3): 1 [drp] via OPHTHALMIC
  Filled 2018-07-25: qty 5

## 2018-07-25 MED ORDER — LIDOCAINE 2% (20 MG/ML) 5 ML SYRINGE
INTRAMUSCULAR | Status: AC
  Filled 2018-07-25: qty 5

## 2018-07-25 MED ORDER — CEFAZOLIN SUBCONJUNCTIVAL INJECTION 100 MG/0.5 ML
100.0000 mg | INJECTION | SUBCONJUNCTIVAL | Status: DC
  Filled 2018-07-25: qty 5

## 2018-07-25 SURGICAL SUPPLY — 61 items
APPLICATOR COTTON TIP 6 STRL (MISCELLANEOUS) ×1 IMPLANT
APPLICATOR COTTON TIP 6IN STRL (MISCELLANEOUS) ×2
BLADE MVR KNIFE 20G (BLADE) IMPLANT
BLADE STAB KNIFE 15DEG (BLADE) IMPLANT
CABLE BIPOLOR RESECTION CORD (MISCELLANEOUS) ×1 IMPLANT
CANNULA ANT CHAM MAIN (OPHTHALMIC RELATED) IMPLANT
CANNULA DUAL BORE 23G (CANNULA) IMPLANT
CANNULA DUALBORE 25G (CANNULA) IMPLANT
CANNULA VLV SOFT TIP 25G (OPHTHALMIC) ×1 IMPLANT
CANNULA VLV SOFT TIP 25GA (OPHTHALMIC) ×2 IMPLANT
CAUTERY EYE LOW TEMP 1300F FIN (OPHTHALMIC RELATED) IMPLANT
CLSR STERI-STRIP ANTIMIC 1/2X4 (GAUZE/BANDAGES/DRESSINGS) ×2 IMPLANT
COVER MAYO STAND STRL (DRAPES) IMPLANT
DRAPE HALF SHEET 40X57 (DRAPES) ×2 IMPLANT
DRAPE INCISE 51X51 W/FILM STRL (DRAPES) IMPLANT
DRAPE RETRACTOR (MISCELLANEOUS) ×2 IMPLANT
ERASER HMR WETFIELD 23G BP (MISCELLANEOUS) ×1 IMPLANT
FORCEPS ECKARDT ILM 25G SERR (OPHTHALMIC RELATED) IMPLANT
FORCEPS GRIESHABER ILM 25G A (INSTRUMENTS) IMPLANT
GAS AUTO FILL CONSTEL (OPHTHALMIC) ×2
GAS AUTO FILL CONSTELLATION (OPHTHALMIC) IMPLANT
GLOVE ECLIPSE 7.5 STRL STRAW (GLOVE) ×2 IMPLANT
GOWN STRL REUS W/ TWL LRG LVL3 (GOWN DISPOSABLE) ×1 IMPLANT
GOWN STRL REUS W/TWL LRG LVL3 (GOWN DISPOSABLE) ×1
KIT BASIN OR (CUSTOM PROCEDURE TRAY) ×2 IMPLANT
KIT TURNOVER KIT B (KITS) ×2 IMPLANT
LENS BIOM SUPER VIEW SET DISP (MISCELLANEOUS) ×2 IMPLANT
MICROPICK 25G (MISCELLANEOUS)
NDL 18GX1X1/2 (RX/OR ONLY) (NEEDLE) ×1 IMPLANT
NDL 25GX 5/8IN NON SAFETY (NEEDLE) ×1 IMPLANT
NDL FILTER BLUNT 18X1 1/2 (NEEDLE) ×1 IMPLANT
NDL HYPO 25GX1X1/2 BEV (NEEDLE) IMPLANT
NDL HYPO 30X.5 LL (NEEDLE) ×2 IMPLANT
NDL RETROBULBAR 25GX1.5 (NEEDLE) ×1 IMPLANT
NEEDLE 18GX1X1/2 (RX/OR ONLY) (NEEDLE) ×2 IMPLANT
NEEDLE 25GX 5/8IN NON SAFETY (NEEDLE) ×2 IMPLANT
NEEDLE FILTER BLUNT 18X 1/2SAF (NEEDLE) ×1
NEEDLE FILTER BLUNT 18X1 1/2 (NEEDLE) ×1 IMPLANT
NEEDLE HYPO 25GX1X1/2 BEV (NEEDLE) IMPLANT
NEEDLE HYPO 30X.5 LL (NEEDLE) ×4 IMPLANT
NEEDLE RETROBULBAR 25GX1.5 (NEEDLE) ×2 IMPLANT
NS IRRIG 1000ML POUR BTL (IV SOLUTION) ×2 IMPLANT
PACK FRAGMATOME (OPHTHALMIC) IMPLANT
PACK VITRECTOMY CUSTOM (CUSTOM PROCEDURE TRAY) ×2 IMPLANT
PAD ARMBOARD 7.5X6 YLW CONV (MISCELLANEOUS) ×4 IMPLANT
PAK PIK VITRECTOMY CVS 25GA (OPHTHALMIC) ×2 IMPLANT
PENCIL BIPOLAR 25GA STR DISP (OPHTHALMIC RELATED) ×1 IMPLANT
PICK MICROPICK 25G (MISCELLANEOUS) IMPLANT
PROBE LASER ILLUM FLEX CVD 25G (OPHTHALMIC) ×1 IMPLANT
ROLLS DENTAL (MISCELLANEOUS) IMPLANT
SCRAPER DIAMOND 25GA (OPHTHALMIC RELATED) IMPLANT
SOLUTION ANTI FOG 6CC (MISCELLANEOUS) ×2 IMPLANT
STOPCOCK 4 WAY LG BORE MALE ST (IV SETS) IMPLANT
SUT VICRYL 7 0 TG140 8 (SUTURE) ×2 IMPLANT
SUT VICRYL 8 0 TG140 8 (SUTURE) IMPLANT
SYR 10ML LL (SYRINGE) IMPLANT
SYR 20CC LL (SYRINGE) ×2 IMPLANT
SYR 5ML LL (SYRINGE) ×2 IMPLANT
SYR TB 1ML LUER SLIP (SYRINGE) IMPLANT
WATER STERILE IRR 1000ML POUR (IV SOLUTION) ×2 IMPLANT
WIPE INSTRUMENT VISIWIPE 73X73 (MISCELLANEOUS) IMPLANT

## 2018-07-25 NOTE — Anesthesia Procedure Notes (Signed)
Procedure Name: MAC Date/Time: 07/25/2018 8:08 PM Performed by: Claris Che, CRNA Pre-anesthesia Checklist: Patient identified, Emergency Drugs available, Suction available, Patient being monitored and Timeout performed Patient Re-evaluated:Patient Re-evaluated prior to induction Oxygen Delivery Method: Nasal cannula

## 2018-07-25 NOTE — Discharge Instructions (Signed)
DO NOT SLEEP ON BACK, THE EYE PRESSURE CAN GO UP AND CAUSE VISION LOSS   SLEEP ON SIDE WITH NOSE TO PILLOW  DURING DAY KEEP FACE DOWN.  15 MINUTES EVERY 2 HOURS IT IS OK TO LOOK STRAIGHT AHEAD (USE BATHROOM, EAT, WALK, ETC.)  

## 2018-07-25 NOTE — Anesthesia Postprocedure Evaluation (Signed)
Anesthesia Post Note  Patient: KLEIGH HOELZER  Procedure(s) Performed: REPAIR OF COMPLEX TRACTION RETINAL DETACHMENT - 25GA VITRECTOMY, ENDOLASER, DRAINAGE OF SUBRETINAL FLUID (Right Eye) Air/Fluid Exchange (Right Eye) Insertion Of Gas SF6 (Right Eye)     Patient location during evaluation: PACU Anesthesia Type: MAC Level of consciousness: awake and alert Pain management: pain level controlled Vital Signs Assessment: post-procedure vital signs reviewed and stable Respiratory status: spontaneous breathing and respiratory function stable Cardiovascular status: stable Postop Assessment: no apparent nausea or vomiting Anesthetic complications: no    Last Vitals:  Vitals:   07/25/18 2150 07/25/18 2200  BP: 121/63   Pulse: 91   Resp: 19   Temp:  (!) 36.4 C  SpO2: 98%     Last Pain:  Vitals:   07/25/18 2124  TempSrc:   PainSc: 0-No pain                 Jaysen Wey DANIEL

## 2018-07-25 NOTE — H&P (Signed)
  Date of examination:  07/25/18  Indication for surgery: Retinal detachment right eye  Pertinent past medical history:  Past Medical History:  Diagnosis Date  . Actinic keratosis   . Allergic rhinitis   . Anxiety   . Chiari malformation   . DDD (degenerative disc disease)   . Diffuse cystic mastopathy   . Family history of other specified malignant neoplasm   . Headache(784.0)   . Insomnia, unspecified   . Lactose intolerance   . Mitral valve disorders(424.0)   . Osteopenia   . Seborrheic dermatitis, unspecified   . Vaginal cyst    stable    Pertinent ocular history: myopia, cataract surgery  Pertinent family history:  Family History  Problem Relation Age of Onset  . Melanoma Sister   . Lung cancer Father   . Coronary artery disease Mother   . Heart failure Mother   . Hypertension Mother   . Anxiety disorder Son   . Lung cancer Brother        + smoker  . Breast cancer Neg Hx     General:  Healthy appearing patient in no distress.    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

## 2018-07-25 NOTE — Brief Op Note (Signed)
07/25/2018  9:19 PM  PATIENT:  Denise Grant  63 y.o. 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  PROCEDURE:  Procedure(s): REPAIR OF RETINAL DETACHMENT (Right), vitrectomy, endocautery, drainage of subretinal fluid, endolaser, air/fluid exchange and SF6 gas tamponade  SURGEON:  Surgeon(s) and Role:    * Jalene Mullet, MD - Primary  PHYSICIAN ASSISTANT:   ASSISTANTS: none   ANESTHESIA:   local and MAC  EBL:  minimal   BLOOD ADMINISTERED:none  DRAINS: none   LOCAL MEDICATIONS USED:  MARCAINE    and LIDOCAINE   SPECIMEN:  No Specimen  DISPOSITION OF SPECIMEN:  N/A  COUNTS:  YES  TOURNIQUET:  * No tourniquets in log *  DICTATION: .Note written in EPIC  PLAN OF CARE: Discharge to home after PACU  PATIENT DISPOSITION:  PACU - hemodynamically stable.   Delay start of Pharmacological VTE agent (>24hrs) due to surgical blood loss or risk of bleeding: not applicable

## 2018-07-25 NOTE — Transfer of Care (Signed)
Immediate Anesthesia Transfer of Care Note  Patient: Denise Grant  Procedure(s) Performed: REPAIR OF COMPLEX TRACTION RETINAL DETACHMENT - 25GA VITRECTOMY (Right Eye)  Patient Location: PACU  Anesthesia Type:MAC combined with regional for post-op pain  Level of Consciousness: awake, alert , oriented and patient cooperative  Airway & Oxygen Therapy: Patient Spontanous Breathing  Post-op Assessment: Report given to RN, Post -op Vital signs reviewed and stable and Patient moving all extremities X 4  Post vital signs: Reviewed and stable  Last Vitals:  Vitals Value Taken Time  BP 132/67 07/25/18 2124  Temp    Pulse 86 07/25/18 2124  Resp 13 07/25/18 2124  SpO2 99 % 07/25/18 2124  Vitals shown include unvalidated device data.  Last Pain:  Vitals:   07/25/18 1859  TempSrc: Oral  PainSc:       Patients Stated Pain Goal: 3 (24/58/09 9833)  Complications: No apparent anesthesia complications

## 2018-07-25 NOTE — Anesthesia Preprocedure Evaluation (Signed)
Anesthesia Evaluation  Patient identified by MRN, date of birth, ID band Patient awake    Reviewed: Allergy & Precautions, NPO status , Patient's Chart, lab work & pertinent test results  Airway Mallampati: II  TM Distance: >3 FB Neck ROM: Full    Dental no notable dental hx. (+) Dental Advisory Given   Pulmonary neg pulmonary ROS, former smoker,    Pulmonary exam normal        Cardiovascular hypertension, Pt. on medications Normal cardiovascular exam     Neuro/Psych  Headaches, PSYCHIATRIC DISORDERS Anxiety    GI/Hepatic negative GI ROS, Neg liver ROS,   Endo/Other  Hypothyroidism   Renal/GU negative Renal ROS  negative genitourinary   Musculoskeletal negative musculoskeletal ROS (+)   Abdominal   Peds negative pediatric ROS (+)  Hematology negative hematology ROS (+)   Anesthesia Other Findings   Reproductive/Obstetrics negative OB ROS                             Anesthesia Physical Anesthesia Plan  ASA: II  Anesthesia Plan: MAC   Post-op Pain Management:    Induction: Intravenous  PONV Risk Score and Plan: 3 and Ondansetron, Dexamethasone and Scopolamine patch - Pre-op  Airway Management Planned: Natural Airway  Additional Equipment:   Intra-op Plan:   Post-operative Plan:   Informed Consent: I have reviewed the patients History and Physical, chart, labs and discussed the procedure including the risks, benefits and alternatives for the proposed anesthesia with the patient or authorized representative who has indicated his/her understanding and acceptance.     Dental advisory given  Plan Discussed with: CRNA and Anesthesiologist  Anesthesia Plan Comments:         Anesthesia Quick Evaluation

## 2018-07-25 NOTE — Op Note (Signed)
EMAAN GARY 07/25/2018 Diagnosis: Retinal detachment right eye  Procedure: REPAIR OF RETINAL DETACHMENT (Right), vitrectomy, endocautery, drainage of subretinal fluid, endolaser, air/fluid exchange and SF6 gas tamponade Operative Eye:  right eye  Surgeon: Royston Cowper Estimated Blood Loss: minimal Specimens for Pathology:  None Complications: none   The  patient was prepped and draped in the usual fashion for ocular surgery on the  right eye .  A lid speculum was placed.  Infusion line and trocar was placed at the 8 o'clock position approximately 3.5 mm from the surgical limbus.   The infusion line was allowed to run and then clamped when placed at the cannula opening. The line was inserted and secured to the drape with an adhesive strip.   Active trocars/cannula were placed at the 10 and 2 o'clock positions approximately 3.5 mm from the surgical limbus. The cannula was visualized in the vitreous cavity.  The light pipe and vitreous cutter were inserted into the vitreous cavity and a core vitrectomy was performed.  Care taken to remove the vitreous up to the vitreous base for 360 degrees with the aid of scleral depression.  The vitreous was carefully shaved over the detachment and area of lattice degeneration with multiple breaks.  Endocuatery was used to make a posterior retinotomy in the area of detachment.  The silicone soft tipped cannula was used to drain the subretinal fluid.  A complete air/fluid exchange was performed.    3 rows of endolaser were applied 360 degrees to the periphery and surrounding the area of lattice degeneration.  18% SF6 gas was placed in the eye.  The superior cannulas were sequentially removed with concommitant tamponade using a cotton tipped applicator and noted to be air tight.  The infusion line and trocar were removed and the sclerotomy was noted to be air tight with normal intraocular pressure by digital palpapation.  Subconjunctival injections of   Dexamethasone 4mg /42ml was placed in the infero-medial quadrant.   The speculum and drapes were removed and the eye was patched with Polymixin/Bacitracin ophthalmic ointment. An eye shield was placed and the patient was transferred alert and conversant with stable vital signs to the post operative recovery area.  The patient tolerated the procedure well and no complications were noted.  Royston Cowper MD

## 2018-07-26 ENCOUNTER — Encounter (HOSPITAL_COMMUNITY): Payer: Self-pay | Admitting: Ophthalmology

## 2018-08-07 ENCOUNTER — Ambulatory Visit
Admission: RE | Admit: 2018-08-07 | Discharge: 2018-08-07 | Disposition: A | Discharge status: Home / Self Care | Payer: 59 | Source: Ambulatory Visit | Attending: Family Medicine | Admitting: Family Medicine

## 2018-08-07 DIAGNOSIS — Z1231 Encounter for screening mammogram for malignant neoplasm of breast: Secondary | ICD-10-CM

## 2018-08-07 IMAGING — MG DIGITAL SCREENING BILATERAL MAMMOGRAM WITH TOMO AND CAD
8 series · 9 of 24 positions shown · non-contrast
Comparison: Previous exam(s).

CLINICAL DATA: Screening.

EXAM:
DIGITAL SCREENING BILATERAL MAMMOGRAM WITH TOMO AND CAD

[L MLO synth-2D]
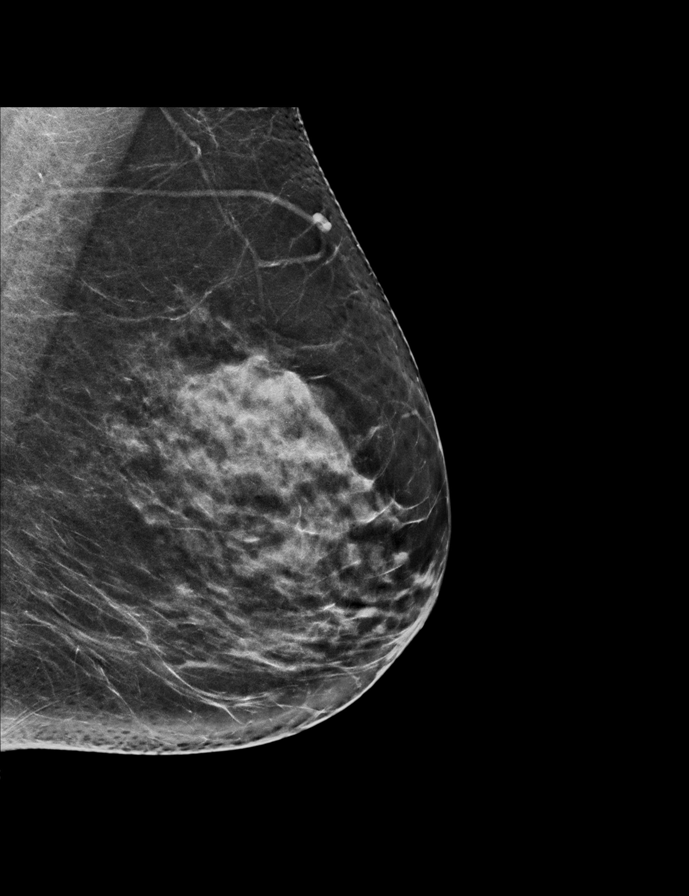

[R CC synth-2D]
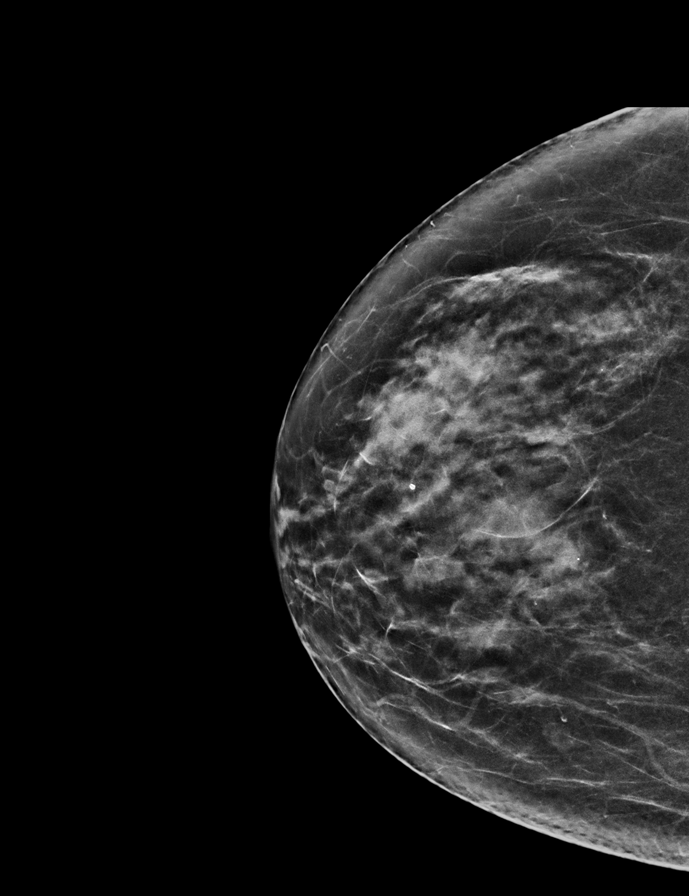

[R MLO synth-2D]
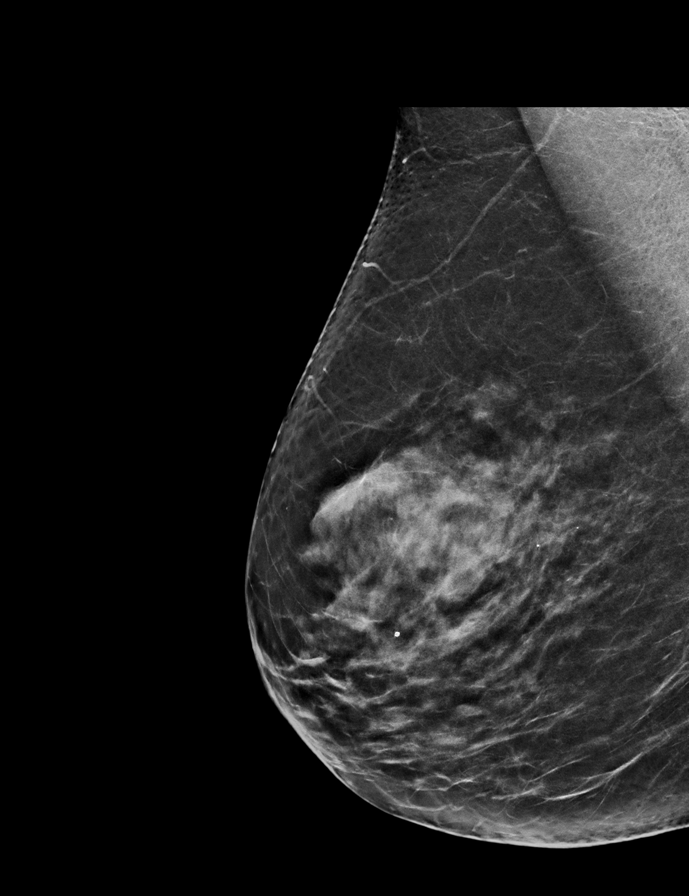

[L CC synth-2D]
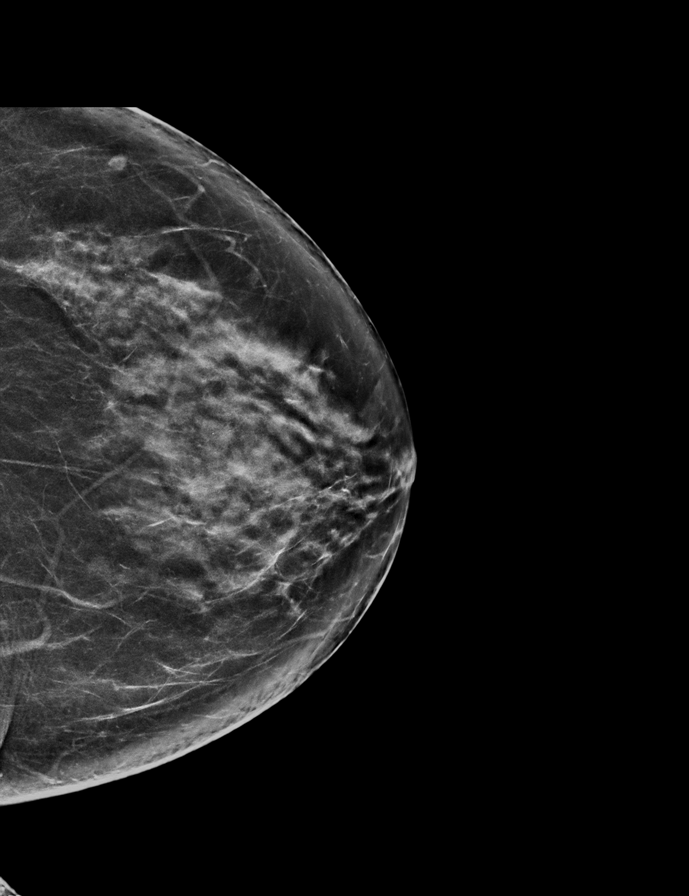

[R CC tomo · 2 of 63 frames shown]
[frame 21/63]
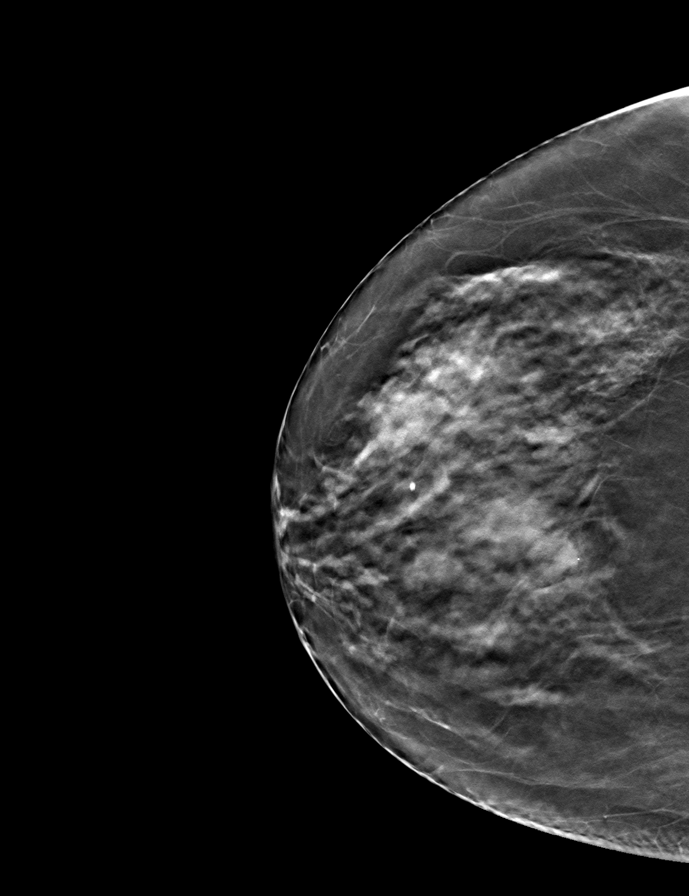
[frame 32/63]
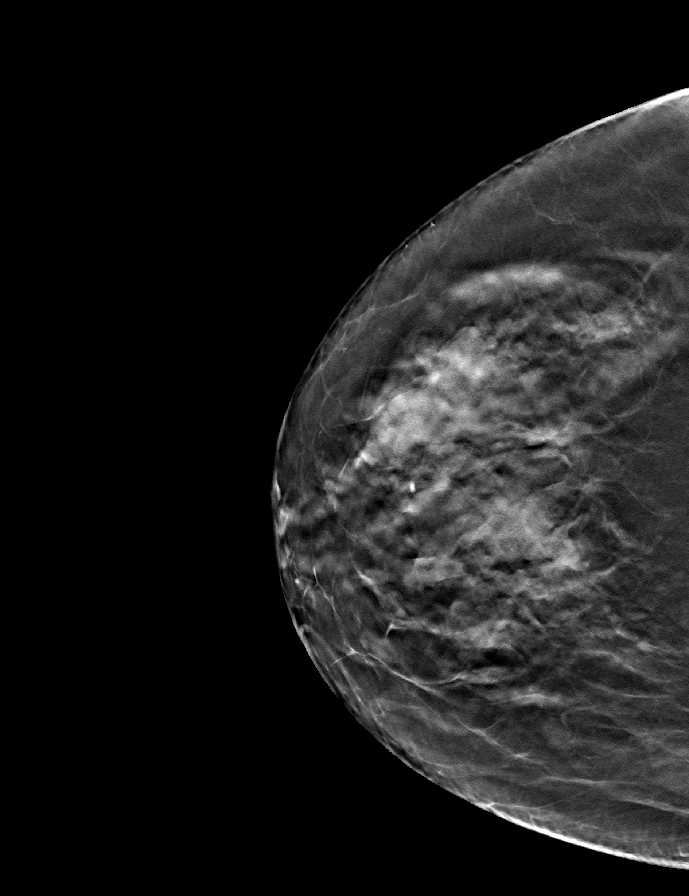

[L MLO tomo · tomo slice 35/70.0]
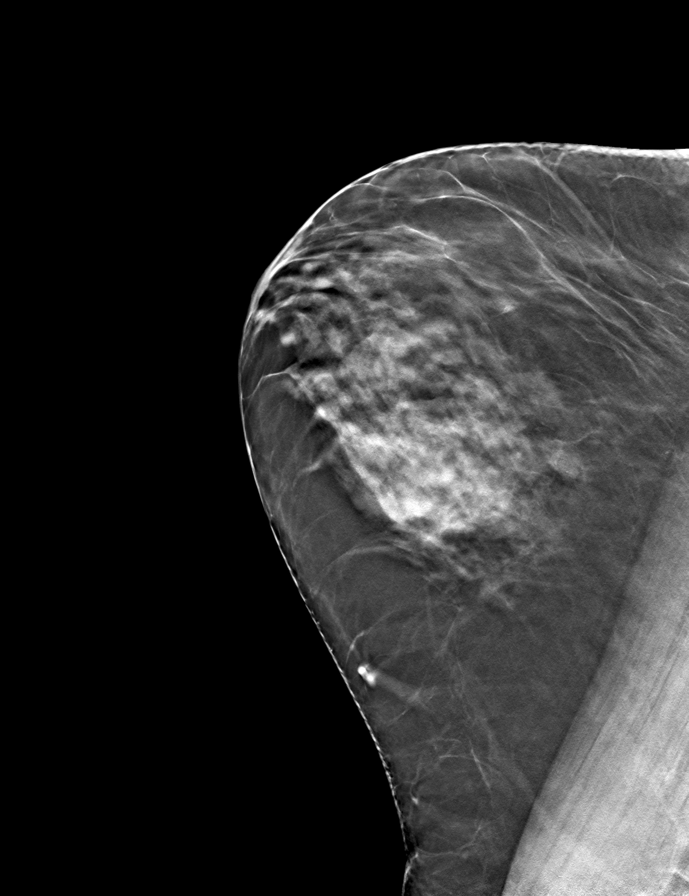

[L CC tomo · tomo slice 35/69.0]
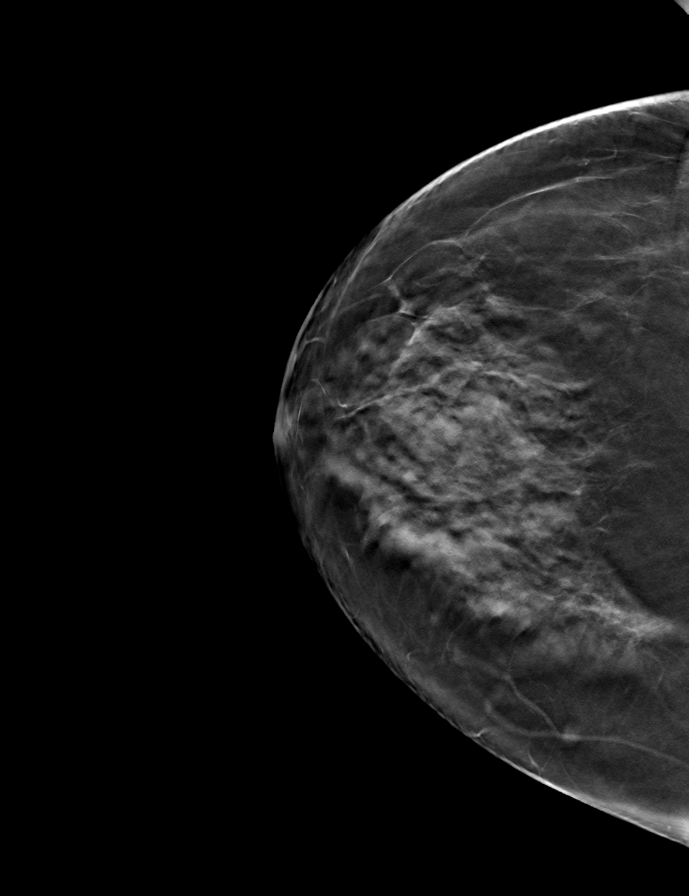

[R MLO tomo · tomo slice 35/68.0]
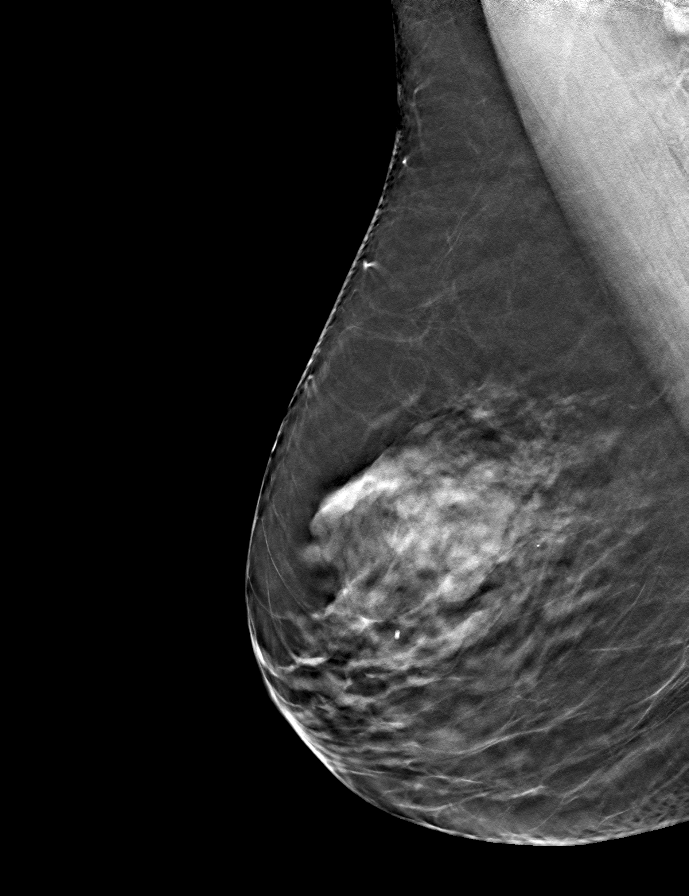

[9 of 24 positions shown; findings below may reference images not displayed]

ACR Breast Density Category c: The breast tissue is heterogeneously
dense, which may obscure small masses.
FINDINGS: There are no findings suspicious for malignancy. Images were
processed with CAD.
IMPRESSION: No mammographic evidence of malignancy. A result letter of this
screening mammogram will be mailed directly to the patient.

RECOMMENDATION:
Screening mammogram in one year. (Code:[5V])

BI-RADS CATEGORY  1: Negative.

## 2018-08-15 ENCOUNTER — Telehealth: Payer: Self-pay

## 2018-08-15 ENCOUNTER — Telehealth: Payer: Self-pay | Admitting: Family Medicine

## 2018-08-15 DIAGNOSIS — Z0000 Encounter for general adult medical examination without abnormal findings: Secondary | ICD-10-CM

## 2018-08-15 DIAGNOSIS — I10 Essential (primary) hypertension: Secondary | ICD-10-CM

## 2018-08-15 DIAGNOSIS — E039 Hypothyroidism, unspecified: Secondary | ICD-10-CM

## 2018-08-15 NOTE — Telephone Encounter (Signed)
-----   Message from Ellamae Sia sent at 08/07/2018 12:39 PM EDT ----- Regarding: Lab orders for Friday, 7.31.20 Patient is scheduled for CPX labs, please order future labs, Thanks , Karna Christmas

## 2018-08-15 NOTE — Telephone Encounter (Signed)
Left message to call clinic, needs COVID screen and back door lab info   

## 2018-08-17 ENCOUNTER — Other Ambulatory Visit (INDEPENDENT_AMBULATORY_CARE_PROVIDER_SITE_OTHER): Payer: 59

## 2018-08-17 ENCOUNTER — Other Ambulatory Visit: Payer: Self-pay

## 2018-08-17 DIAGNOSIS — E039 Hypothyroidism, unspecified: Secondary | ICD-10-CM | POA: Diagnosis not present

## 2018-08-17 DIAGNOSIS — I10 Essential (primary) hypertension: Secondary | ICD-10-CM

## 2018-08-17 LAB — COMPREHENSIVE METABOLIC PANEL
ALT: 19 U/L (ref 0–35)
AST: 24 U/L (ref 0–37)
Albumin: 4.6 g/dL (ref 3.5–5.2)
Alkaline Phosphatase: 57 U/L (ref 39–117)
BUN: 10 mg/dL (ref 6–23)
CO2: 29 mEq/L (ref 19–32)
Calcium: 10 mg/dL (ref 8.4–10.5)
Chloride: 106 mEq/L (ref 96–112)
Creatinine, Ser: 0.83 mg/dL (ref 0.40–1.20)
GFR: 69.12 mL/min (ref >60.00)
Glucose, Bld: 92 mg/dL (ref 70–99)
Potassium: 4.6 mEq/L (ref 3.5–5.1)
Sodium: 142 mEq/L (ref 135–145)
Total Bilirubin: 0.9 mg/dL (ref 0.2–1.2)
Total Protein: 6.8 g/dL (ref 6.0–8.3)

## 2018-08-17 LAB — LIPID PANEL
Cholesterol: 163 mg/dL (ref 0–200)
HDL: 60.7 mg/dL (ref >39.00)
LDL Cholesterol: 86 mg/dL (ref 0–99)
NonHDL: 102.11
Total CHOL/HDL Ratio: 3
Triglycerides: 83 mg/dL (ref 0.0–149.0)
VLDL: 16.6 mg/dL (ref 0.0–40.0)

## 2018-08-17 LAB — CBC WITH DIFFERENTIAL/PLATELET
Basophils Absolute: 0 10*3/uL (ref 0.0–0.1)
Basophils Relative: 0.9 % (ref 0.0–3.0)
Eosinophils Absolute: 0 10*3/uL (ref 0.0–0.7)
Eosinophils Relative: 0.6 % (ref 0.0–5.0)
HCT: 41.3 % (ref 36.0–46.0)
Hemoglobin: 14 g/dL (ref 12.0–15.0)
Lymphocytes Relative: 28.7 % (ref 12.0–46.0)
Lymphs Abs: 1.3 10*3/uL (ref 0.7–4.0)
MCHC: 34 g/dL (ref 30.0–36.0)
MCV: 89.6 fl (ref 78.0–100.0)
Monocytes Absolute: 0.4 10*3/uL (ref 0.1–1.0)
Monocytes Relative: 8.2 % (ref 3.0–12.0)
Neutro Abs: 2.9 10*3/uL (ref 1.4–7.7)
Neutrophils Relative %: 61.6 % (ref 43.0–77.0)
Platelets: 213 10*3/uL (ref 150.0–400.0)
RBC: 4.61 Mil/uL (ref 3.87–5.11)
RDW: 14 % (ref 11.5–15.5)
WBC: 4.7 10*3/uL (ref 4.0–10.5)

## 2018-08-17 LAB — TSH: TSH: 0.45 u[IU]/mL (ref 0.35–4.50)

## 2018-08-24 ENCOUNTER — Other Ambulatory Visit: Payer: Self-pay

## 2018-08-24 ENCOUNTER — Ambulatory Visit (INDEPENDENT_AMBULATORY_CARE_PROVIDER_SITE_OTHER): Payer: 59 | Admitting: Family Medicine

## 2018-08-24 ENCOUNTER — Encounter: Payer: Self-pay | Admitting: Family Medicine

## 2018-08-24 VITALS — BP 130/70 | HR 94 | Temp 98.9°F | Ht 65.75 in | Wt 147.1 lb

## 2018-08-24 DIAGNOSIS — Z1211 Encounter for screening for malignant neoplasm of colon: Secondary | ICD-10-CM | POA: Diagnosis not present

## 2018-08-24 DIAGNOSIS — Z0000 Encounter for general adult medical examination without abnormal findings: Secondary | ICD-10-CM

## 2018-08-24 DIAGNOSIS — M899 Disorder of bone, unspecified: Secondary | ICD-10-CM

## 2018-08-24 DIAGNOSIS — I10 Essential (primary) hypertension: Secondary | ICD-10-CM

## 2018-08-24 DIAGNOSIS — F411 Generalized anxiety disorder: Secondary | ICD-10-CM

## 2018-08-24 DIAGNOSIS — E039 Hypothyroidism, unspecified: Secondary | ICD-10-CM

## 2018-08-24 DIAGNOSIS — M949 Disorder of cartilage, unspecified: Secondary | ICD-10-CM

## 2018-08-24 MED ORDER — LEVOTHYROXINE SODIUM 50 MCG PO TABS: ORAL_TABLET | ORAL | 3 refills | Status: DC

## 2018-08-24 MED ORDER — AMLODIPINE BESYLATE 5 MG PO TABS: 5.0000 mg | ORAL_TABLET | Freq: Every day | ORAL | 3 refills | Status: DC

## 2018-08-24 NOTE — Patient Instructions (Addendum)
Don't miss a flu shot in the fall   Take care of yourself  Add exercise if /when you can   Labs look good

## 2018-08-24 NOTE — Progress Notes (Signed)
Subjective:    Patient ID: Denise Grant, female    DOB: 15-Apr-1955, 63 y.o.   MRN: [id number]  HPI  Here for health maintenance exam and to review chronic medical problems    Had a retinal detachment and surgery for this (hereditary) Other than that doing well   Wt Readings from Last 3 Encounters:  08/24/18 147 lb 1 oz (66.7 kg)  07/25/18 156 lb (70.8 kg)  11/15/17 151 lb 8 oz (68.7 kg)  lost some weight-did not eat during her retinal surgery recovery Not a lot of exercise -too hot  23.92 kg/m   Colon screen 7/17 ifob neg cologuard neg 7/18   Flu vaccine - gets at work  Tdap 4/14 Zoster status -not interested in vaccine   Pap 7/18 -nl pap with neg hpv screen No new partners or gyn symptoms   Mammogram 7/20 Self breast exam - no lumps or changes  dexa 2014 - declines another one ) Osteopenia  Falls-none Fractures- none  Supplements - taking vit D  Exercise not much   bp is stable today  No cp or palpitations or headaches or edema  No side effects to medicines  BP Readings from Last 3 Encounters:  08/24/18 (!) 144/70  07/25/18 121/63  11/15/17 122/70    Re check BP: 130/70    Lab Results  Component Value Date   CREATININE 0.83 08/17/2018   BUN 10 08/17/2018   NA 142 08/17/2018   K 4.6 08/17/2018   CL 106 08/17/2018   CO2 29 08/17/2018   Lab Results  Component Value Date   ALT 19 08/17/2018   AST 24 08/17/2018   ALKPHOS 57 08/17/2018   BILITOT 0.9 08/17/2018   Lab Results  Component Value Date   WBC 4.7 08/17/2018   HGB 14.0 08/17/2018   HCT 41.3 08/17/2018   MCV 89.6 08/17/2018   PLT 213.0 08/17/2018   Hypothyroidism  Pt has no clinical changes No change in energy level/ hair or skin/ edema and no tremor Lab Results  Component Value Date   TSH 0.45 08/17/2018     Cholesterol  Lab Results  Component Value Date   CHOL 163 08/17/2018   CHOL 156 07/28/2017   CHOL 159 07/22/2016   Lab Results  Component Value Date   HDL 60.70  08/17/2018   HDL 57.80 07/28/2017   HDL 57.20 07/22/2016   Lab Results  Component Value Date   LDLCALC 86 08/17/2018   LDLCALC 86 07/28/2017   LDLCALC 88 07/22/2016   Lab Results  Component Value Date   TRIG 83.0 08/17/2018   TRIG 61.0 07/28/2017   TRIG 68.0 07/22/2016   Lab Results  Component Value Date   CHOLHDL 3 08/17/2018   CHOLHDL 3 07/28/2017   CHOLHDL 3 07/22/2016   No results found for: LDLDIRECT Good profile Diet is fairly good -stable  Patient Active Problem List   Diagnosis Date Noted  . Left shoulder pain 11/15/2017  . Essential hypertension 01/01/2015  . Hypothyroidism 06/25/2014  . Migraine with vertigo 05/21/2014  . History of Chiari malformation 05/21/2014  . Atrophic vaginitis 05/01/2013  . Colon cancer screening 04/20/2012  . Encounter for routine gynecological examination 11/24/2010  . Routine general medical examination at a health care facility 11/18/2010  . INSOMNIA 01/27/2010  . HEADACHE 03/31/2008  . Disorder of bone and cartilage 09/18/2007  . Generalized anxiety disorder 05/05/2006  . MITRAL VALVE PROLAPSE 05/05/2006  . ALLERGIC RHINITIS 05/05/2006  . FIBROCYSTIC BREAST DISEASE  05/05/2006  . SEBORRHEIC DERMATITIS 05/05/2006  . KERATOSIS, ACTINIC 05/05/2006  . NECK PAIN, CHRONIC 05/05/2006   Past Medical History:  Diagnosis Date  . Actinic keratosis   . Allergic rhinitis   . Anxiety   . Chiari malformation   . DDD (degenerative disc disease)   . Family history of other specified malignant neoplasm   . Headache(784.0)   . Insomnia, unspecified   . Lactose intolerance   . Mitral valve disorders(424.0)   . Osteopenia   . Seborrheic dermatitis, unspecified   . Vaginal cyst    stable   Past Surgical History:  Procedure Laterality Date  . AIR/FLUID EXCHANGE Right 07/25/2018   Procedure: Air/Fluid Exchange;  Surgeon: Jalene Mullet, MD;  Location: [facility];  Service: Ophthalmology;  Laterality: Right;  . bone spur removal  10/06    shoulder  . CATARACT EXTRACTION, BILATERAL Bilateral 2016  . [place] SURGERY  2005  . Chiari Malformation surgery  6/03  . EXCISION MORTON'S NEUROMA  2/04  . GAS INSERTION Right 07/25/2018   Procedure: Insertion Of Gas SF6;  Surgeon: Jalene Mullet, MD;  Location[place];  Service: Ophthalmology;  Laterality: Right;  . LUMBAR DISC SURGERY    . 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   Procedure: REPAIR OF COMPLEX TRACTION RETINAL DETACHMENT - 25GA VITRECTOMY, ENDOLASER, DRAINAGE OF SUBRETINAL FLUID;  Surgeon: Jalene Mullet, MD;  Location: [place];  Service: Ophthalmology;  Laterality: Right;  . TUBAL LIGATION     Social History   Tobacco Use  . Smoking status: Former Smoker    Quit date: 01/17/2001    Years since quitting: 17.6  . Smokeless tobacco: Never Used  Substance Use Topics  . Alcohol use: No    Alcohol/week: 0.0 standard drinks  . Drug use: No   Family History  Problem Relation Age of Onset  . Melanoma Sister   . Lung cancer Father   . Coronary artery disease Mother   . Heart failure Mother   . Hypertension Mother   . Anxiety disorder Son   . Lung cancer Brother        + smoker  . Breast cancer Neg Hx    Allergies  Allergen Reactions  . Calcium     REACTION: cannot tolerate any calcium supplement - GI sympotms  . Fluticasone Propionate     REACTION: headache/irritation  . Hctz [Hydrochlorothiazide] Other (See Comments)    Eye redness and discomfort/ slt blurred vision   . Paroxetine     REACTION: abdominal pain and constipation   Current Outpatient Medications on File Prior to Visit  Medication Sig Dispense Refill  . Chlorphen-Phenyleph-Ibuprofen (ADVIL ALLERGY & CONGESTION PO) Take 0.5 tablets by mouth as needed (allergies).     . LORazepam (ATIVAN) 1 MG tablet TAKE 1/2 (ONE-HALF) TABLET BY MOUTH AT BEDTIME AS NEEDED (Patient taking differently: Take 0.5 mg by mouth daily as needed for anxiety. ) 15 tablet 3  . meclizine (ANTIVERT)  25 MG tablet Take 1 tablet (25 mg total) by mouth 3 (three) times daily as needed for dizziness. 15 tablet 0   No current facility-administered medications on file prior to visit.     Review of Systems  Constitutional: Negative for activity change, appetite change, fatigue, fever and unexpected weight change.  HENT: Negative for congestion, ear pain, rhinorrhea, sinus pressure and sore throat.   Eyes: Negative for pain, redness and visual disturbance.       Vision is now fine  Respiratory:  Negative for cough, shortness of breath and wheezing.   Cardiovascular: Negative for chest pain and palpitations.  Gastrointestinal: Negative for abdominal pain, blood in stool, constipation and diarrhea.  Endocrine: Negative for polydipsia and polyuria.  Genitourinary: Negative for dysuria, frequency and urgency.  Musculoskeletal: Negative for arthralgias, back pain and myalgias.  Skin: Negative for pallor and rash.  Allergic/Immunologic: Negative for environmental allergies.  Neurological: Negative for dizziness, syncope and headaches.  Hematological: Negative for adenopathy. Does not bruise/bleed easily.  Psychiatric/Behavioral: Negative for decreased concentration and dysphoric mood. The patient is not nervous/anxious.        Objective:   Physical Exam Constitutional:      General: She is not in acute distress.    Appearance: Normal appearance. She is well-developed and normal weight. She is not ill-appearing.  HENT:     Head: Normocephalic and atraumatic.     Right Ear: Tympanic membrane, ear canal and external ear normal.     Left Ear: Tympanic membrane, ear canal and external ear normal.     Nose: Nose normal.     Mouth/Throat:     Mouth: Mucous membranes are moist.     Pharynx: Oropharynx is clear. No posterior oropharyngeal erythema.  Eyes:     General: No scleral icterus.    Conjunctiva/sclera: Conjunctivae normal.     Pupils: Pupils are equal, round, and reactive to light.  Neck:      Musculoskeletal: Normal range of motion and neck supple.     Thyroid: No thyromegaly.     Vascular: No carotid bruit or JVD.  Cardiovascular:     Rate and Rhythm: Normal rate and regular rhythm.     Pulses: Normal pulses.     Heart sounds: Normal heart sounds. No murmur. No gallop.   Pulmonary:     Effort: Pulmonary effort is normal. No respiratory distress.     Breath sounds: Normal breath sounds. No wheezing.  Chest:     Chest wall: No tenderness.  Abdominal:     General: Bowel sounds are normal. There is no distension or abdominal bruit.     Palpations: Abdomen is soft. There is no mass.     Tenderness: There is no abdominal tenderness.     Hernia: No hernia is present.  Genitourinary:    Comments: Breast exam: No mass, nodules, thickening, tenderness, bulging, retraction, inflamation, nipple discharge or skin changes noted.  No axillary or clavicular LA.     Musculoskeletal: Normal range of motion.        General: No tenderness.     Right lower leg: No edema.     Left lower leg: No edema.  Lymphadenopathy:     Cervical: No cervical adenopathy.  Skin:    General: Skin is warm and dry.     Coloration: Skin is not pale.     Findings: No erythema or rash.     Comments: Solar lentigines diffusely Some sks  Neurological:     Mental Status: She is alert. Mental status is at baseline.     Cranial Nerves: No cranial nerve deficit.     Motor: No abnormal muscle tone.     Coordination: Coordination normal.     Gait: Gait normal.     Deep Tendon Reflexes: Reflexes normal.  Psychiatric:        Mood and Affect: Mood normal.        Cognition and Memory: Cognition and memory normal.           Assessment &  Plan:   Problem List Items Addressed This Visit      Cardiovascular and Mediastinum   Essential hypertension    bp in fair control at this time  BP Readings from Last 1 Encounters:  08/24/18 130/70   No changes needed Most recent labs reviewed  Disc lifstyle  change with low sodium diet and exercise        Relevant Medications   amLODipine (NORVASC) 5 MG tablet     Endocrine   Hypothyroidism    Hypothyroidism  Pt has no clinical changes No change in energy level/ hair or skin/ edema and no tremor Lab Results  Component Value Date   TSH 0.45 08/17/2018          Relevant Medications   levothyroxine (SYNTHROID) 50 MCG tablet     Musculoskeletal and Integument   Disorder of bone and cartilage    Declines another dexa at this time  Disc need for calcium/ vitamin D/ wt bearing exercise and bone density test every 2 y to monitor Disc safety/ fracture risk in detail           Other   Generalized anxiety disorder    Takes occ lorazepam       Routine general medical examination at a health care facility - Primary    Reviewed health habits including diet and exercise and skin cancer prevention Reviewed appropriate screening tests for age  Also reviewed health mt list, fam hx and immunization status , as well as social and family history   See HPI Labs reviewed  Enc flu shot in the fall Enc exercise when able      Colon cancer screening    cologuard neg 7/18 -good for 3 y  No bowel changes

## 2018-08-26 NOTE — Assessment & Plan Note (Signed)
Takes occ lorazepam

## 2018-08-26 NOTE — Assessment & Plan Note (Signed)
Hypothyroidism  Pt has no clinical changes No change in energy level/ hair or skin/ edema and no tremor Lab Results  Component Value Date   TSH 0.45 08/17/2018

## 2018-08-26 NOTE — Assessment & Plan Note (Signed)
Declines another dexa at this time  Disc need for calcium/ vitamin D/ wt bearing exercise and bone density test every 2 y to monitor Disc safety/ fracture risk in detail

## 2018-08-26 NOTE — Assessment & Plan Note (Signed)
bp in fair control at this time  BP Readings from Last 1 Encounters:  08/24/18 130/70   No changes needed Most recent labs reviewed  Disc lifstyle change with low sodium diet and exercise

## 2018-08-26 NOTE — Assessment & Plan Note (Signed)
cologuard neg 7/18 -good for 3 y  No bowel changes

## 2018-08-26 NOTE — Assessment & Plan Note (Signed)
Reviewed health habits including diet and exercise and skin cancer prevention Reviewed appropriate screening tests for age  Also reviewed health mt list, fam hx and immunization status , as well as social and family history   See HPI Labs reviewed  Enc flu shot in the fall Enc exercise when able

## 2018-09-19 ENCOUNTER — Emergency Department (HOSPITAL_COMMUNITY)
Admission: EM | Admit: 2018-09-19 | Discharge: 2018-09-19 | Disposition: A | Discharge status: Home / Self Care | Payer: 59 | Attending: Emergency Medicine | Admitting: Emergency Medicine

## 2018-09-19 ENCOUNTER — Emergency Department (HOSPITAL_COMMUNITY): Payer: 59

## 2018-09-19 DIAGNOSIS — I10 Essential (primary) hypertension: Secondary | ICD-10-CM | POA: Insufficient documentation

## 2018-09-19 DIAGNOSIS — Z79899 Other long term (current) drug therapy: Secondary | ICD-10-CM | POA: Insufficient documentation

## 2018-09-19 DIAGNOSIS — Z87891 Personal history of nicotine dependence: Secondary | ICD-10-CM | POA: Insufficient documentation

## 2018-09-19 DIAGNOSIS — I4891 Unspecified atrial fibrillation: Secondary | ICD-10-CM | POA: Insufficient documentation

## 2018-09-19 DIAGNOSIS — R0602 Shortness of breath: Secondary | ICD-10-CM | POA: Diagnosis present

## 2018-09-19 LAB — CBC WITH DIFFERENTIAL/PLATELET
Abs Immature Granulocytes: 0.04 10*3/uL (ref 0.00–0.07)
Basophils Absolute: 0 10*3/uL (ref 0.0–0.1)
Basophils Relative: 0 %
Eosinophils Absolute: 0 10*3/uL (ref 0.0–0.5)
Eosinophils Relative: 0 %
HCT: 40.1 % (ref 36.0–46.0)
Hemoglobin: 13.8 g/dL (ref 12.0–15.0)
Immature Granulocytes: 1 %
Lymphocytes Relative: 13 %
Lymphs Abs: 1.1 10*3/uL (ref 0.7–4.0)
MCH: 30.5 pg (ref 26.0–34.0)
MCHC: 34.4 g/dL (ref 30.0–36.0)
MCV: 88.7 fL (ref 80.0–100.0)
Monocytes Absolute: 0.3 10*3/uL (ref 0.1–1.0)
Monocytes Relative: 3 %
Neutro Abs: 7.3 10*3/uL (ref 1.7–7.7)
Neutrophils Relative %: 83 %
Platelets: 290 10*3/uL (ref 150–400)
RBC: 4.52 MIL/uL (ref 3.87–5.11)
RDW: 13 % (ref 11.5–15.5)
WBC: 8.8 10*3/uL (ref 4.0–10.5)
nRBC: 0 % (ref 0.0–0.2)

## 2018-09-19 LAB — COMPREHENSIVE METABOLIC PANEL
ALT: 27 U/L (ref 0–44)
AST: 39 U/L (ref 15–41)
Albumin: 4 g/dL (ref 3.5–5.0)
Alkaline Phosphatase: 55 U/L (ref 38–126)
Anion gap: 15 (ref 5–15)
BUN: 18 mg/dL (ref 8–23)
CO2: 17 mmol/L — ABNORMAL LOW (ref 22–32)
Calcium: 9.6 mg/dL (ref 8.9–10.3)
Chloride: 105 mmol/L (ref 98–111)
Creatinine, Ser: 0.96 mg/dL (ref 0.44–1.00)
GFR calc Af Amer: >60 mL/min (ref >60)
GFR calc non Af Amer: >60 mL/min (ref >60)
Glucose, Bld: 169 mg/dL — ABNORMAL HIGH (ref 70–99)
Potassium: 3.4 mmol/L — ABNORMAL LOW (ref 3.5–5.1)
Sodium: 137 mmol/L (ref 135–145)
Total Bilirubin: 0.9 mg/dL (ref 0.3–1.2)
Total Protein: 6.7 g/dL (ref 6.5–8.1)

## 2018-09-19 LAB — TSH: TSH: 1.744 u[IU]/mL (ref 0.350–4.500)

## 2018-09-19 LAB — T4, FREE: Free T4: 1.04 ng/dL (ref 0.61–1.12)

## 2018-09-19 LAB — MAGNESIUM: Magnesium: 1.8 mg/dL (ref 1.7–2.4)

## 2018-09-19 IMAGING — DX DG CHEST 1V PORT
1 series · 1 of 1 positions shown · non-contrast
Comparison: None.

CLINICAL DATA: Shortness of breath.  Heart palpitations.

EXAM:
PORTABLE CHEST 1 VIEW

[chest ap]
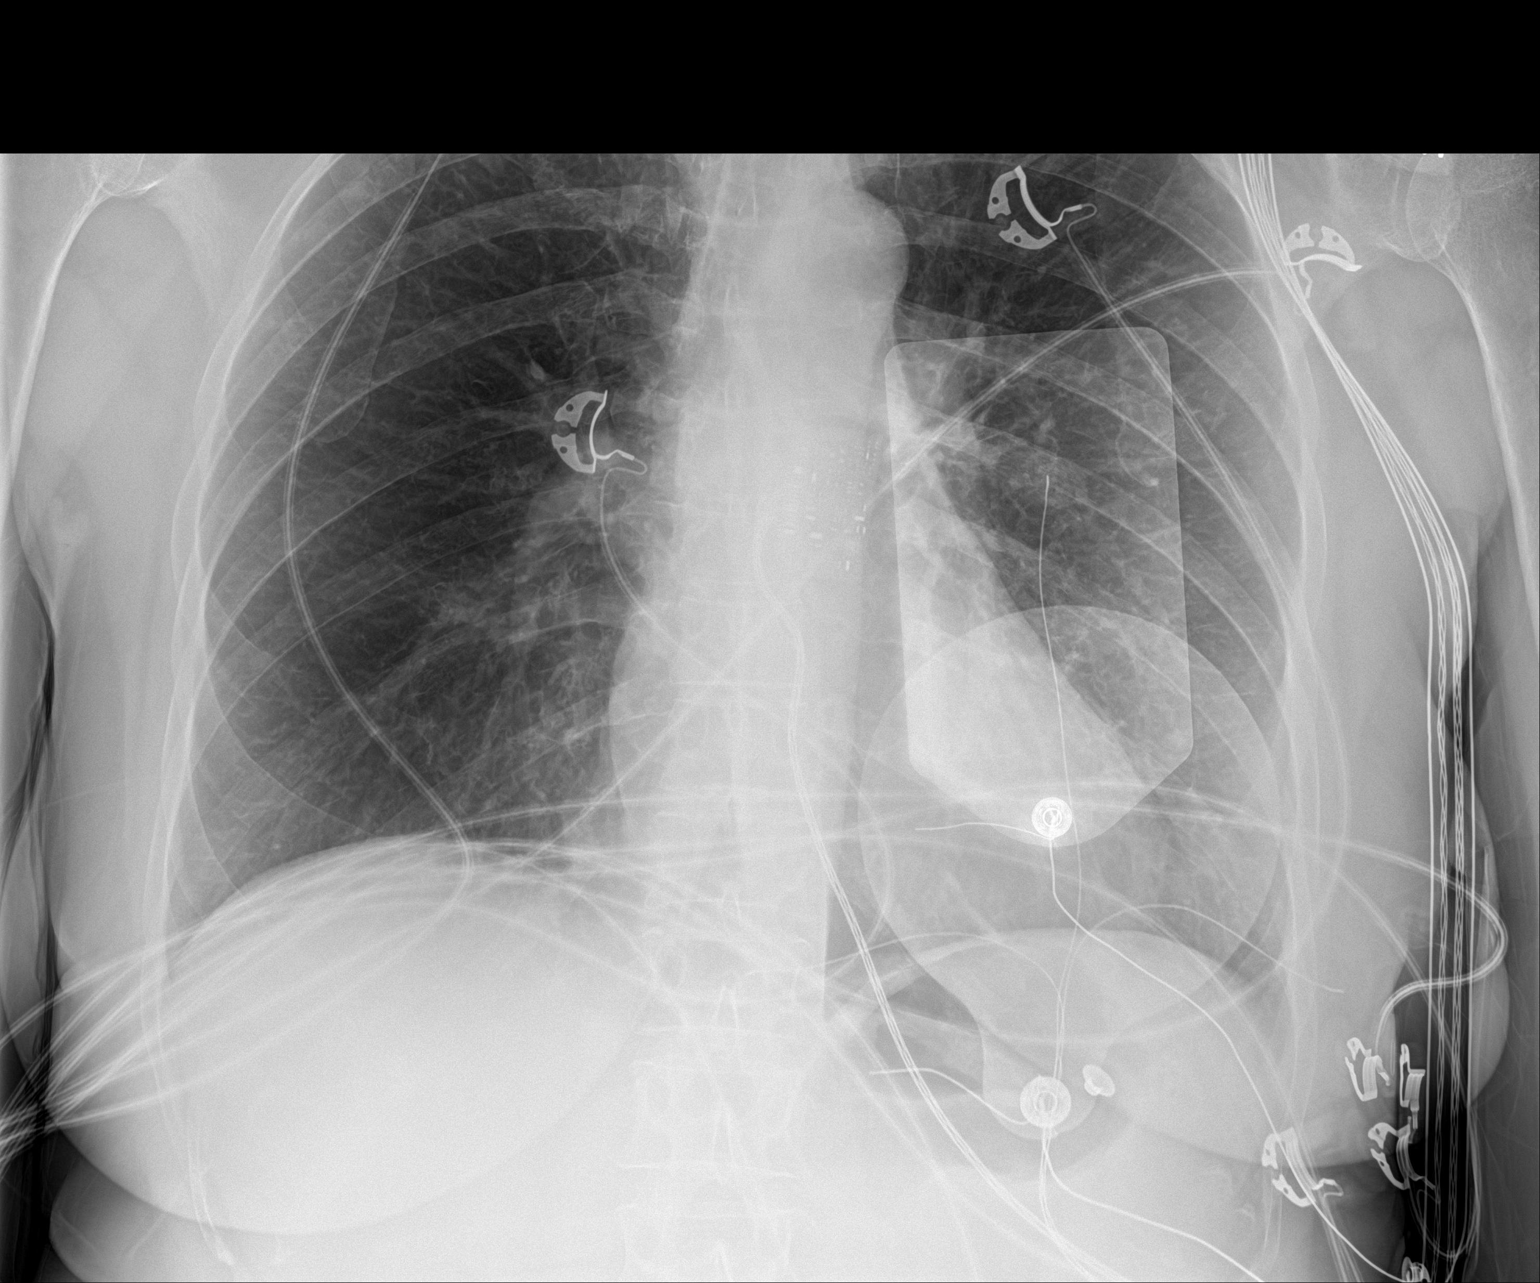

[1 of 1 positions shown; findings below may reference images not displayed]

FINDINGS: External pacemaker leads present.

Cardiomediastinal silhouette is normal. Mediastinal contours appear
intact.

There is no evidence of focal airspace consolidation, pleural
effusion or pneumothorax.

Osseous structures are without acute abnormality. Soft tissues are
grossly normal.
IMPRESSION: No active disease.

## 2018-09-19 MED ORDER — PROPOFOL 10 MG/ML IV BOLUS
35.0000 mg | Freq: Once | INTRAVENOUS | Status: AC
  Administered 2018-09-19: 35 mg via INTRAVENOUS
  Filled 2018-09-19: qty 20

## 2018-09-19 MED ORDER — SODIUM CHLORIDE 0.9 % IV BOLUS
1000.0000 mL | Freq: Once | INTRAVENOUS | Status: AC
  Administered 2018-09-19: 1000 mL via INTRAVENOUS

## 2018-09-19 MED ORDER — RIVAROXABAN 20 MG PO TABS: 20.0000 mg | ORAL_TABLET | Freq: Every day | ORAL | 0 refills | Status: DC

## 2018-09-19 MED ORDER — PROPOFOL 10 MG/ML IV BOLUS
INTRAVENOUS | Status: AC | PRN
  Administered 2018-09-19 (×2): 10 mg via INTRAVENOUS

## 2018-09-19 MED ORDER — RIVAROXABAN (XARELTO) EDUCATION KIT FOR AFIB PATIENTS
PACK | Freq: Once | Status: AC
  Administered 2018-09-19: 19:00:00
  Filled 2018-09-19: qty 1

## 2018-09-19 MED ORDER — RIVAROXABAN 20 MG PO TABS
20.0000 mg | ORAL_TABLET | Freq: Once | ORAL | Status: AC
  Administered 2018-09-19: 20 mg via ORAL
  Filled 2018-09-19: qty 1

## 2018-09-19 NOTE — Sedation Documentation (Signed)
Cardioverted at 200j

## 2018-09-19 NOTE — ED Provider Notes (Signed)
[facility] EMERGENCY DEPARTMENT Provider Note   CSN: [id number] Arrival date & time: 09/19/18  1545     History   Chief Complaint Chief Complaint  Patient presents with  . Shortness of Breath    HPI Denise Grant is a 63 y.o. female.     63 yo F chief complaints of feeling bad.  This started about lunchtime today.  She had to leave from work.  Ended up calling 911.  Found to be in a rapid heart rate going into about 200.  Was given 2 doses of adenosine with what looks like A. fib versus atrial flutter.  Given some metoprolol with transient improvement.  Patient continues to be very symptomatic from this.  Prior to this event she denied any symptoms denied chest pain shortness of breath abdominal pain vomiting diarrhea cough or fever.  She denies history of prior atrial fibrillation.  Denies prior MI.  Denies history of stroke.  The history is provided by the patient.  Shortness of Breath Severity:  Moderate Onset quality:  Sudden Duration:  4 hours Timing:  Constant Progression:  Worsening Chronicity:  New Relieved by:  Nothing Worsened by:  Nothing Ineffective treatments:  None tried Associated symptoms: chest pain   Associated symptoms: no fever, no headaches, no vomiting and no wheezing     Past Medical History:  Diagnosis Date  . Actinic keratosis   . Allergic rhinitis   . Anxiety   . Chiari malformation   . DDD (degenerative disc disease)   . Family history of other specified malignant neoplasm   . Headache(784.0)   . Insomnia, unspecified   . Lactose intolerance   . Mitral valve disorders(424.0)   . Osteopenia   . Seborrheic dermatitis, unspecified   . Vaginal cyst    stable    Patient Active Problem List   Diagnosis Date Noted  . Left shoulder pain 11/15/2017  . Essential hypertension 01/01/2015  . Hypothyroidism 06/25/2014  . Migraine with vertigo 05/21/2014  . History of Chiari malformation 05/21/2014  . Atrophic vaginitis  05/01/2013  . Colon cancer screening 04/20/2012  . Encounter for routine gynecological examination 11/24/2010  . Routine general medical examination at a health care facility 11/18/2010  . INSOMNIA 01/27/2010  . HEADACHE 03/31/2008  . Disorder of bone and cartilage 09/18/2007  . Generalized anxiety disorder 05/05/2006  . MITRAL VALVE PROLAPSE 05/05/2006  . ALLERGIC RHINITIS 05/05/2006  . FIBROCYSTIC BREAST DISEASE 05/05/2006  . SEBORRHEIC DERMATITIS 05/05/2006  . KERATOSIS, ACTINIC 05/05/2006  . NECK PAIN, CHRONIC 05/05/2006    Past Surgical History:  Procedure Laterality Date  . AIR/FLUID EXCHANGE Right 07/25/2018   Procedure: Air/Fluid Exchange;  Surgeon: Jalene Mullet, MD;  Location: [facility];  Service: Ophthalmology;  Laterality: Right;  . bone spur removal  10/06   shoulder  . CATARACT EXTRACTION, BILATERAL Bilateral 2016  . [facility] SURGERY  2005  . Chiari Malformation surgery  6/03  . EXCISION MORTON'S NEUROMA  2/04  . GAS INSERTION Right 07/25/2018   Procedure: Insertion Of Gas SF6;  Surgeon: Jalene Mullet, MD;  Location: [facility];  Service: Ophthalmology;  Laterality: Right;  . LUMBAR DISC SURGERY    . 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 TUBAL LIGATION       OB  History   No obstetric history on file.      Home Medications    Prior to Admission medications   Medication Sig Start Date End Date Taking? Authorizing Provider  amLODipine (NORVASC) 5 MG tablet Take 1 tablet (5 mg total) by mouth daily. 08/24/18   Tower, Wynelle Fanny, MD  Chlorphen-Phenyleph-Ibuprofen (ADVIL ALLERGY & CONGESTION PO) Take 0.5 tablets by mouth as needed (allergies).     [provider]  levothyroxine (SYNTHROID) 50 MCG tablet TAKE 1 TABLET BY MOUTH  DAILY BEFORE BREAKFAST 08/24/18   Tower, Roque Lias A, MD  LORazepam (ATIVAN) 1 MG tablet TAKE 1/2 (ONE-HALF) TABLET BY MOUTH AT BEDTIME AS NEEDED Patient taking differently: Take 0.5 mg by mouth daily as needed for anxiety.  07/13/18   Tower, Wynelle Fanny, MD  meclizine (ANTIVERT) 25 MG tablet Take 1 tablet (25 mg total) by mouth 3 (three) times daily as needed for dizziness. 05/21/14   Tower, Wynelle Fanny, MD  rivaroxaban (XARELTO) 20 MG TABS tablet Take 1 tablet (20 mg total) by mouth daily with supper. 09/19/18   Deno Etienne, DO    Family History Family History  Problem Relation Age of Onset  . Melanoma Sister   . Lung cancer Father   . Coronary artery disease Mother   . Heart failure Mother   . Hypertension Mother   . Anxiety disorder Son   . Lung cancer Brother        + smoker  . Breast cancer Neg Hx     Social History Social History   Tobacco Use  . Smoking status: Former Smoker    Quit date: 01/17/2001    Years since quitting: 17.6  . Smokeless tobacco: Never Used  Substance Use Topics  . Alcohol use: No    Alcohol/week: 0.0 standard drinks  . Drug use: No     Allergies   Calcium, Fluticasone propionate, Hctz [hydrochlorothiazide], and Paroxetine   Review of Systems Review of Systems  Constitutional: Negative for chills and fever.  HENT: Negative for congestion and rhinorrhea.   Eyes: Negative for redness and visual disturbance.  Respiratory: Positive for shortness of breath. Negative for wheezing.   Cardiovascular: Positive for chest pain. Negative for palpitations.  Gastrointestinal: Negative for nausea and vomiting.  Genitourinary: Negative for dysuria and urgency.  Musculoskeletal: Negative for arthralgias and myalgias.  Skin: Negative for pallor and wound.  Neurological: Positive for weakness. Negative for dizziness and headaches.     Physical Exam Updated Vital Signs BP 126/74   Pulse 76   Temp 98.4 F (36.9 C) (Oral)   Resp 20   Wt 66.7 kg   SpO2 100%   BMI 23.91  kg/m   Physical Exam Vitals signs and nursing note reviewed.  Constitutional:      General: She is not in acute distress.    Appearance: She is well-developed. She is not diaphoretic.  HENT:     Head: Normocephalic and atraumatic.  Eyes:     Pupils: Pupils are equal, round, and reactive to light.  Neck:     Musculoskeletal: Normal range of motion and neck supple.  Cardiovascular:     Rate and Rhythm: Regular rhythm. Tachycardia present.     Heart sounds: No murmur. No friction rub. No gallop.   Pulmonary:     Effort: Pulmonary effort is normal.     Breath sounds: No wheezing or rales.  Abdominal:     General: There is no distension.     Palpations: Abdomen is  soft.     Tenderness: There is no abdominal tenderness.  Musculoskeletal:        General: No tenderness.  Skin:    General: Skin is warm and dry.  Neurological:     Mental Status: She is alert and oriented to person, place, and time.  Psychiatric:        Behavior: Behavior normal.      ED Treatments / Results  Labs (all labs ordered are listed, but only abnormal results are displayed) Labs Reviewed  COMPREHENSIVE METABOLIC PANEL - Abnormal; Notable for the following components:      Result Value   Potassium 3.4 (*)    CO2 17 (*)    Glucose, Bld 169 (*)    All other components within normal limits  CBC WITH DIFFERENTIAL/PLATELET  MAGNESIUM  T4, FREE  TSH    EKG EKG Interpretation  Date/Time:  Wednesday September 19 2018 15:52:13 EDT Ventricular Rate:  139 PR Interval:    QRS Duration: 111 QT Interval:  311 QTC Calculation: 487 R Axis:   45 Text Interpretation:  Atrial fibrillation Anteroseptal infarct, age indeterminate No old tracing to compare Confirmed by Deno Etienne [id number]) on 09/19/2018 3:58:19 PM   Radiology Dg Chest Port 1 View  Result Date: 09/19/2018 CLINICAL DATA:  Shortness of breath.  Heart palpitations. EXAM: PORTABLE CHEST 1 VIEW COMPARISON:  None. FINDINGS: External pacemaker leads  present. Cardiomediastinal silhouette is normal. Mediastinal contours appear intact. There is no evidence of focal airspace consolidation, pleural effusion or pneumothorax. Osseous structures are without acute abnormality. Soft tissues are grossly normal. IMPRESSION: No active disease. Electronically Signed   By: Fidela Salisbury M.D.   On: 09/19/2018 17:57    Procedures .Sedation  Date/Time: 09/19/2018 4:19 PM Performed by: Deno Etienne, DO Authorized by: Deno Etienne, DO   Consent:    Consent obtained:  Verbal   Consent given by:  Patient   Risks discussed:  Allergic reaction, dysrhythmia, inadequate sedation, nausea, prolonged hypoxia resulting in organ damage, prolonged sedation necessitating reversal, respiratory compromise necessitating ventilatory assistance and intubation and vomiting   Alternatives discussed:  Analgesia without sedation, anxiolysis and regional anesthesia Universal protocol:    Procedure explained and questions answered to patient or proxy's satisfaction: yes     Relevant documents present and verified: yes     Test results available and properly labeled: yes     Imaging studies available: yes     Required blood products, implants, devices, and special equipment available: yes     Site/side marked: yes     Immediately prior to procedure a time out was called: yes     Patient identity confirmation method:  Verbally with patient Indications:    Procedure necessitating sedation performed by:  Physician performing sedation Pre-sedation assessment:    Time since last food or drink:  3   ASA classification: class 1 - normal, healthy patient     Neck mobility: normal     Mouth opening:  3 or more finger widths   Thyromental distance:  4 finger widths   Mallampati score:  I - soft palate, uvula, fauces, pillars visible   Pre-sedation assessments completed and reviewed: airway patency, cardiovascular function, hydration status, mental status, nausea/vomiting, pain  level, respiratory function and temperature   Immediate pre-procedure details:    Reassessment: Patient reassessed immediately prior to procedure     Reviewed: vital signs, relevant labs/tests and NPO status     Verified: bag valve mask available, emergency equipment available, intubation  equipment available, IV patency confirmed, oxygen available and suction available   Procedure details (see MAR for exact dosages):    Preoxygenation:  Nasal cannula   Sedation:  Propofol   Intra-procedure monitoring:  Blood pressure monitoring, cardiac monitor, continuous pulse oximetry, frequent LOC assessments, frequent vital sign checks and continuous capnometry   Intra-procedure events: respiratory depression     Intra-procedure management:  Airway repositioning and supplemental oxygen   Total Provider sedation time (minutes):  35 Post-procedure details:    Attendance: Constant attendance by certified staff until patient recovered     Recovery: Patient returned to pre-procedure baseline     Post-sedation assessments completed and reviewed: airway patency, cardiovascular function, hydration status, mental status, nausea/vomiting, pain level, respiratory function and temperature     Patient is stable for discharge or admission: yes     Patient tolerance:  Tolerated well, no immediate complications .Cardioversion  Date/Time: 09/19/2018 4:19 PM Performed by: Deno Etienne, DO Authorized by: Deno Etienne, DO   Consent:    Consent obtained:  Verbal   Consent given by:  Patient   Risks discussed:  Cutaneous burn, death, induced arrhythmia and pain   Alternatives discussed:  Rate-control medication, anti-coagulation medication, delayed treatment, alternative treatment, observation and referral Pre-procedure details:    Cardioversion basis:  Emergent   Rhythm:  Atrial fibrillation   Electrode placement:  Anterior-posterior Patient sedated: No Attempt one:    Cardioversion mode:  Synchronous   Waveform:   Biphasic   Shock (Joules):  200   Shock outcome:  Conversion to normal sinus rhythm Post-procedure details:    Patient status:  Awake   Patient tolerance of procedure:  Tolerated well, no immediate complications   (including critical care time)  Medications Ordered in ED Medications  rivaroxaban (XARELTO) Education Kit for Afib patients (has no administration in time range)  sodium chloride 0.9 % bolus 1,000 mL (1,000 mLs Intravenous New Bag/Given 09/19/18 1607)  propofol (DIPRIVAN) 10 mg/mL bolus/IV push 35 mg (35 mg Intravenous Given 09/19/18 1609)  propofol (DIPRIVAN) 10 mg/mL bolus/IV push (10 mg Intravenous Given 09/19/18 1611)     Initial Impression / Assessment and Plan / ED Course  I have reviewed the triage vital signs and the nursing notes.  Pertinent labs & imaging results that were available during my care of the patient were reviewed by me and considered in my medical decision making (see chart for details).        63 yo F with a chief complaints of feeling bad.  I think this is likely due to her heart rate.  Seems to be related to the rate more than she had something because the rate issue.  As she still seems very symptomatic on my exam I will cardiovert her.  Started less than 5 hours ago.   Patient was cardioverted with significant improvement of her symptoms.  Now in normal sinus rhythm.  She is mildly acidotic without anion gap.  She has no significant electrolyte abnormality.  No leukocytosis.  Chest x-ray without focal infiltrate or pneumothorax.  She was able to ambulate without difficulty.  Will discharge at this time.  Started on Xarelto.  A. fib clinic follow-up.  CRITICAL CARE Performed by: Cecilio Asper   Total critical care time: 35 minutes  Critical care time was exclusive of separately billable procedures and treating other patients.  Critical care was necessary to treat or prevent imminent or life-threatening deterioration.  Critical care was  time spent personally by me on  the following activities: development of treatment plan with patient and/or surrogate as well as nursing, discussions with consultants, evaluation of patient's response to treatment, examination of patient, obtaining history from patient or surrogate, ordering and performing treatments and interventions, ordering and review of laboratory studies, ordering and review of radiographic studies, pulse oximetry and re-evaluation of patient's condition.   CHA2DS2/VAS Stroke Risk Points      N/A >= 2 Points: High Risk  1 - 1.99 Points: Medium Risk  0 Points: Low Risk    A final score could not be computed because of missing components.: Last  Change: N/A     This score determines the patient's risk of having a stroke if the  patient has atrial fibrillation.      This score is not applicable to this patient. Components are not  calculated.    The patients results and plan were reviewed and discussed.   Any x-rays performed were independently reviewed by myself.   Differential diagnosis were considered with the presenting HPI.  Medications  rivaroxaban (XARELTO) Education Kit for Afib patients (has no administration in time range)  sodium chloride 0.9 % bolus 1,000 mL (1,000 mLs Intravenous New Bag/Given 09/19/18 1607)  propofol (DIPRIVAN) 10 mg/mL bolus/IV push 35 mg (35 mg Intravenous Given 09/19/18 1609)  propofol (DIPRIVAN) 10 mg/mL bolus/IV push (10 mg Intravenous Given 09/19/18 1611)    Vitals:   09/19/18 1622 09/19/18 1624 09/19/18 1630 09/19/18 1645  BP: (!) 120/95 121/81 127/77 126/74  Pulse: 87 86 81 76  Resp: 20 (!) '23 20 20  ' Temp: 98.4 F (36.9 C)     TempSrc: Oral     SpO2: 99% 99% 100% 100%  Weight:        Final diagnoses:  Atrial fibrillation with rapid ventricular response (HCC)    Admission/ observation were discussed with the admitting physician, patient and/or family and they are comfortable with the plan.    Final Clinical  Impressions(s) / ED Diagnoses   Final diagnoses:  Atrial fibrillation with rapid ventricular response [facility])    ED Discharge Orders         Ordered    rivaroxaban (XARELTO) 20 MG TABS tablet  Daily with supper     09/19/18 Sangrey, Taiyana Kissler, DO 09/19/18 1817

## 2018-09-19 NOTE — Sedation Documentation (Signed)
10mg  propofol administered per md verbal order

## 2018-09-19 NOTE — ED Triage Notes (Signed)
Pt bib ems from home with sob. Ems found HR 200. Given 6mg  then 12mg  adenosine with some improvement. Given a total of 4 doses IV metoprolol with EMS with HR on arrival 160. Pt appears SOB on arrival. EDP at bedside.

## 2018-09-19 NOTE — ED Notes (Signed)
Patient Alert and oriented to baseline. Stable and ambulatory to baseline. Patient verbalized understanding of the discharge instructions.  Patient belongings were taken by the patient.   

## 2018-09-19 NOTE — Discharge Instructions (Signed)
Return for repeat symptoms.  Follow up with your cardiologist.    Information on my medicine - XARELTO (Rivaroxaban)  This medication education was reviewed with me or my healthcare representative as part of my discharge preparation.  Why was Xarelto prescribed for you? Xarelto was prescribed for you to reduce the risk of a blood clot forming that can cause a stroke if you have a medical condition called atrial fibrillation (a type of irregular heartbeat).  What do you need to know about xarelto ? Take your Xarelto ONCE DAILY at the same time every day with your evening meal. If you have difficulty swallowing the tablet whole, you may crush it and mix in applesauce just prior to taking your dose.  Take Xarelto exactly as prescribed by your doctor and DO NOT stop taking Xarelto without talking to the doctor who prescribed the medication.  Stopping without other stroke prevention medication to take the place of Xarelto may increase your risk of developing a clot that causes a stroke.  Refill your prescription before you run out.  After discharge, you should have regular check-up appointments with your healthcare provider that is prescribing your Xarelto.  In the future your dose may need to be changed if your kidney function or weight changes by a significant amount.  What do you do if you miss a dose? If you are taking Xarelto ONCE DAILY and you miss a dose, take it as soon as you remember on the same day then continue your regularly scheduled once daily regimen the next day. Do not take two doses of Xarelto at the same time or on the same day.   Important Safety Information A possible side effect of Xarelto is bleeding. You should call your healthcare provider right away if you experience any of the following: ? Bleeding from an injury or your nose that does not stop. ? Unusual colored urine (red or dark brown) or unusual colored stools (red or black). ? Unusual bruising for  unknown reasons. ? A serious fall or if you hit your head (even if there is no bleeding).  Some medicines may interact with Xarelto and might increase your risk of bleeding while on Xarelto. To help avoid this, consult your healthcare provider or pharmacist prior to using any new prescription or non-prescription medications, including herbals, vitamins, non-steroidal anti-inflammatory drugs (NSAIDs) and supplements.  This website has more information on Xarelto: [email or web address]

## 2018-09-19 NOTE — ED Notes (Signed)
Pt ambulated to the restroom and back with stand by assistance. Heart rate 89-93 while ambulating, O2 100%.

## 2018-09-21 ENCOUNTER — Encounter (HOSPITAL_COMMUNITY): Payer: Self-pay | Admitting: Physician Assistant

## 2018-09-21 ENCOUNTER — Ambulatory Visit (HOSPITAL_COMMUNITY)
Admission: RE | Admit: 2018-09-21 | Discharge: 2018-09-21 | Disposition: A | Discharge status: Home / Self Care | Payer: 59 | Source: Ambulatory Visit | Attending: Physician Assistant | Admitting: Physician Assistant

## 2018-09-21 ENCOUNTER — Other Ambulatory Visit: Payer: Self-pay

## 2018-09-21 VITALS — BP 158/76 | HR 104 | Ht 65.75 in | Wt 145.8 lb

## 2018-09-21 DIAGNOSIS — Z87891 Personal history of nicotine dependence: Secondary | ICD-10-CM | POA: Insufficient documentation

## 2018-09-21 DIAGNOSIS — Z7901 Long term (current) use of anticoagulants: Secondary | ICD-10-CM | POA: Diagnosis not present

## 2018-09-21 DIAGNOSIS — Z79899 Other long term (current) drug therapy: Secondary | ICD-10-CM | POA: Diagnosis not present

## 2018-09-21 DIAGNOSIS — F411 Generalized anxiety disorder: Secondary | ICD-10-CM | POA: Diagnosis not present

## 2018-09-21 DIAGNOSIS — I341 Nonrheumatic mitral (valve) prolapse: Secondary | ICD-10-CM | POA: Insufficient documentation

## 2018-09-21 DIAGNOSIS — E039 Hypothyroidism, unspecified: Secondary | ICD-10-CM | POA: Diagnosis not present

## 2018-09-21 DIAGNOSIS — Z8249 Family history of ischemic heart disease and other diseases of the circulatory system: Secondary | ICD-10-CM | POA: Diagnosis not present

## 2018-09-21 DIAGNOSIS — I10 Essential (primary) hypertension: Secondary | ICD-10-CM | POA: Insufficient documentation

## 2018-09-21 DIAGNOSIS — Z888 Allergy status to other drugs, medicaments and biological substances status: Secondary | ICD-10-CM | POA: Insufficient documentation

## 2018-09-21 DIAGNOSIS — Z808 Family history of malignant neoplasm of other organs or systems: Secondary | ICD-10-CM | POA: Insufficient documentation

## 2018-09-21 DIAGNOSIS — I480 Paroxysmal atrial fibrillation: Secondary | ICD-10-CM

## 2018-09-21 DIAGNOSIS — Z79890 Hormone replacement therapy (postmenopausal): Secondary | ICD-10-CM | POA: Diagnosis not present

## 2018-09-21 DIAGNOSIS — I4891 Unspecified atrial fibrillation: Secondary | ICD-10-CM | POA: Diagnosis present

## 2018-09-21 DIAGNOSIS — Z801 Family history of malignant neoplasm of trachea, bronchus and lung: Secondary | ICD-10-CM | POA: Diagnosis not present

## 2018-09-21 MED ORDER — RIVAROXABAN 20 MG PO TABS: 20.0000 mg | ORAL_TABLET | Freq: Every day | ORAL | 3 refills | Status: DC

## 2018-09-21 MED ORDER — METOPROLOL SUCCINATE ER 25 MG PO TB24: 25.0000 mg | ORAL_TABLET | Freq: Every day | ORAL | 6 refills | Status: DC

## 2018-09-21 NOTE — Progress Notes (Signed)
Primary Care Physician: Tower, Wynelle Fanny, MD Primary Cardiologist: none Primary Electrophysiologist: none Referring Physician: [facility]   Denise Grant is a 63 y.o. female with a history of Chiari malformation, HTN, hypothyroidism, MVP, generalized anxiety, and paroxysmal atrial fibrillation who presents for consultation in the [facility].  The patient was initially diagnosed with atrial fibrillation on 09/19/18 after presenting to the ER with symptoms of SOB, chest discomfort, and weakness. She was found to be in afib with RVR and underwent successful DCCV. There were no specific triggers that the patient could identify. She denies snoring or significant alcohol use. She has noted that her heart rate becomes elevated with any exertion and she is still fatigued.  Today, she denies symptoms of chest pain, shortness of breath, orthopnea, PND, lower extremity edema, dizziness, presyncope, syncope, snoring, daytime somnolence, bleeding, or neurologic sequela. The patient is tolerating medications without difficulties and is otherwise without complaint today.    Atrial Fibrillation Risk Factors:  she does not have symptoms or diagnosis of sleep apnea. she does not have a history of rheumatic fever. she does not have a history of alcohol use. The patient does have a history of early familial atrial fibrillation or other arrhythmias. Sister has afib, brother has PPM.  she has a BMI of Body mass index is 23.71 kg/m.Marland Kitchen Filed Weights   09/21/18 1131  Weight: 66.1 kg    Family History  Problem Relation Age of Onset  . Melanoma Sister   . Lung cancer Father   . Coronary artery disease Mother   . Heart failure Mother   . Hypertension Mother   . Anxiety disorder Son   . Lung cancer Brother        + smoker  . Breast cancer Neg Hx      Atrial Fibrillation Management history:  Previous antiarrhythmic drugs: none Previous cardioversions: 09/19/18 Previous  ablations: none CHADS2VASC score: 2 Anticoagulation history: Xarelto   Past Medical History:  Diagnosis Date  . Actinic keratosis   . Allergic rhinitis   . Anxiety   . Chiari malformation   . DDD (degenerative disc disease)   . Family history of other specified malignant neoplasm   . Headache(784.0)   . Insomnia, unspecified   . Lactose intolerance   . Mitral valve disorders(424.0)   . Osteopenia   . Seborrheic dermatitis, unspecified   . Vaginal cyst    stable   Past Surgical History:  Procedure Laterality Date  . AIR/FLUID EXCHANGE Right 07/25/2018   Procedure: Air/Fluid Exchange;  Surgeon: Jalene Mullet, MD;  Location: [place];  Service: Ophthalmology;  Laterality: Right;  . bone spur removal  10/06   shoulder  . CATARACT EXTRACTION, BILATERAL Bilateral 2016  . [place] SURGERY  2005  . Chiari Malformation surgery  6/03  . EXCISION MORTON'S NEUROMA  2/04  . GAS INSERTION Right 07/25/2018   Procedure: Insertion Of Gas SF6;  Surgeon: Jalene Mullet, MD;  Location: [place];  Service: Ophthalmology;  Laterality: Right;  . LUMBAR DISC SURGERY    . 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 TUBAL LIGATION      Current Outpatient Medications  Medication Sig Dispense Refill  . amLODipine (NORVASC) 5 MG tablet Take 1 tablet (5 mg total) by  mouth daily. 90 tablet 3  . levothyroxine (SYNTHROID) 50 MCG tablet TAKE 1 TABLET BY MOUTH DAILY BEFORE BREAKFAST 90 tablet 3  . LORazepam (ATIVAN) 1 MG tablet TAKE 1/2 (ONE-HALF) TABLET BY MOUTH AT BEDTIME AS NEEDED (Patient taking differently: Take 0.5 mg by mouth daily as needed for anxiety. ) 15 tablet 3  . meclizine (ANTIVERT) 25 MG tablet Take 1 tablet (25 mg total) by mouth 3 (three) times daily as needed  for dizziness. 15 tablet 0  . metoprolol succinate (TOPROL XL) 25 MG 24 hr tablet Take 1 tablet (25 mg total) by mouth daily. 30 tablet 6  . rivaroxaban (XARELTO) 20 MG TABS tablet Take 1 tablet (20 mg total) by mouth daily with supper. 30 tablet 3   No current facility-administered medications for this encounter.     Allergies  Allergen Reactions  . Calcium     REACTION: cannot tolerate any calcium supplement - GI sympotms  . Fluticasone Propionate     REACTION: headache/irritation  . Hctz [Hydrochlorothiazide] Other (See Comments)    Eye redness and discomfort/ slt blurred vision   . Paroxetine     REACTION: abdominal pain and constipation    Social History   Socioeconomic History  . Marital status: Married    Spouse name: Not on file  . Number of children: Not on file  . Years of education: Not on file  . Highest education level: Not on file  Occupational History  . Not on file  Social Needs  . Financial resource strain: Not on file  . Food insecurity    Worry: Not on file    Inability: Not on file  . Transportation needs    Medical: Not on file    Non-medical: Not on file  Tobacco Use  . Smoking status: Former Smoker    Quit date: 01/17/2001    Years since quitting: 17.6  . Smokeless tobacco: Never Used  Substance and Sexual Activity  . Alcohol use: No    Alcohol/week: 0.0 standard drinks  . Drug use: No  . Sexual activity: Not on file  Lifestyle  . Physical activity    Days per week: Not on file    Minutes per session: Not on file  . Stress: Not on file  Relationships  . Social Herbalist on phone: Not on file    Gets together: Not on file    Attends religious service: Not on file    Active member of club or organization: Not on file    Attends meetings of clubs or organizations: Not on file    Relationship status: Not on file  . Intimate partner violence    Fear of current or ex partner: Not on file    Emotionally abused: Not on file     Physically abused: Not on file    Forced sexual activity: Not on file  Other Topics Concern  . Not on file  Social History Narrative   Walks daily for exercise      Married--husband diagnosed with stomach cancer (5/11)   Lost her husband 4/12 to cancer      ROS- All systems are reviewed and negative except as per the HPI above.  Physical Exam: Vitals:   09/21/18 1131  BP: (!) 158/76  Pulse: (!) 104  Weight: 66.1 kg  Height: 5' 5.75" (1.67 m)    GEN- The patient is well appearing, alert and oriented x 3 today.   Head- normocephalic, atraumatic Eyes-  Sclera clear, conjunctiva pink Ears- hearing intact Oropharynx- clear Neck- supple  Lungs- Clear to ausculation bilaterally, normal work of breathing Heart- Regular rhythm, tachycardic, no murmurs, rubs or gallops  GI- soft, NT, ND, + BS Extremities- no clubbing, cyanosis, or edema MS- no significant deformity or atrophy Skin- no rash or lesion Psych- euthymic mood, full affect Neuro- strength and sensation are intact  Wt Readings from Last 3 Encounters:  09/21/18 66.1 kg  09/19/18 66.7 kg  08/24/18 66.7 kg    EKG today demonstrates sinus tach, NST, PR 134, QRS 82, QTc 454  Epic records are reviewed at length today  Assessment and Plan:  1. Paroxysmal atrial fibrillation General education about afib provided and questions answered.  We also discussed her stroke risk and the risks and benefits of anticoagulation.  Patient still having daily symptoms of heart racing. She is in SR today. ? ST vs afib vs SVT. Will check 3 day Zio patch to evaluate for arrhythmia burden. Will start Toprol 25 mg daily. Check echocardiogram Continue Xarelto 20 mg daily for at least 4 weeks post DCCV with no missed doses. We discussed that her CHADSVASC score will be 3 with her birthday in [birth year]. Long term anticoagulation would be recommended per guidelines at that point.  This patients CHA2DS2-VASc Score and unadjusted Ischemic  Stroke Rate (% per year) is equal to 2.2 % stroke rate/year from a score of 2  Above score calculated as 1 point each if present [CHF, HTN, DM, Vascular=MI/PAD/Aortic Plaque, Age if 65-74, or Female] Above score calculated as 2 points each if present [Age > 75, or Stroke/TIA/TE]   2. MVP Per patient history. Check echocardiogram as above.  3. HTN Elevated today. Will start BB as above.   Follow up in the AF clinic in 3-4 weeks.   [facility] [address] [place], [place] [address] [phone] 09/21/2018 12:20 PM

## 2018-09-21 NOTE — Patient Instructions (Signed)
Start metoprolol 25mg once a day 

## 2018-09-22 ENCOUNTER — Telehealth: Payer: Self-pay | Admitting: Cardiology

## 2018-09-22 NOTE — Telephone Encounter (Signed)
Pt called- wanted to know if it was OK to take Metoprolol 25 mg with a B/P of 123456 systolic- I reassured it was OK to take.  Kerin Ransom PA-C 09/22/2018 9:57 AM

## 2018-09-26 ENCOUNTER — Other Ambulatory Visit: Payer: Self-pay

## 2018-09-26 ENCOUNTER — Encounter (HOSPITAL_COMMUNITY): Payer: Self-pay | Admitting: Physician Assistant

## 2018-09-26 ENCOUNTER — Ambulatory Visit: Payer: 59 | Admitting: Family Medicine

## 2018-09-26 ENCOUNTER — Ambulatory Visit (HOSPITAL_COMMUNITY)
Admission: RE | Admit: 2018-09-26 | Discharge: 2018-09-26 | Disposition: A | Discharge status: Home / Self Care | Payer: 59 | Source: Ambulatory Visit | Attending: Physician Assistant | Admitting: Physician Assistant

## 2018-09-26 DIAGNOSIS — I080 Rheumatic disorders of both mitral and aortic valves: Secondary | ICD-10-CM | POA: Insufficient documentation

## 2018-09-26 DIAGNOSIS — I480 Paroxysmal atrial fibrillation: Secondary | ICD-10-CM | POA: Insufficient documentation

## 2018-09-26 DIAGNOSIS — I313 Pericardial effusion (noninflammatory): Secondary | ICD-10-CM | POA: Diagnosis not present

## 2018-09-26 NOTE — Progress Notes (Signed)
  Echocardiogram 2D Echocardiogram has been performed.  Denise Grant G [name] 09/26/2018, 11:18 AM

## 2018-09-27 ENCOUNTER — Ambulatory Visit: Payer: 59 | Admitting: Family Medicine

## 2018-09-27 ENCOUNTER — Encounter: Payer: Self-pay | Admitting: Family Medicine

## 2018-09-27 VITALS — BP 136/80 | HR 58 | Temp 97.2°F | Ht 65.75 in | Wt 146.4 lb

## 2018-09-27 DIAGNOSIS — E039 Hypothyroidism, unspecified: Secondary | ICD-10-CM

## 2018-09-27 DIAGNOSIS — I480 Paroxysmal atrial fibrillation: Secondary | ICD-10-CM | POA: Diagnosis not present

## 2018-09-27 DIAGNOSIS — R5382 Chronic fatigue, unspecified: Secondary | ICD-10-CM

## 2018-09-27 DIAGNOSIS — I10 Essential (primary) hypertension: Secondary | ICD-10-CM | POA: Diagnosis not present

## 2018-09-27 DIAGNOSIS — Z23 Encounter for immunization: Secondary | ICD-10-CM

## 2018-09-27 DIAGNOSIS — R5383 Other fatigue: Secondary | ICD-10-CM | POA: Insufficient documentation

## 2018-09-27 NOTE — Assessment & Plan Note (Signed)
S/p cardioversion for a fib  Suspect pt is adjusting to her metoprolol  Will f/u with cardiology next week to disc  Rev s/s of orthostatic hypotension to watch for

## 2018-09-27 NOTE — Assessment & Plan Note (Signed)
bp remains stable with addn of metoprolol (for rate control with a fib) bp in fair control at this time  BP Readings from Last 1 Encounters:  09/27/18 136/80   No changes needed Most recent labs reviewed  Disc lifstyle change with low sodium diet and exercise

## 2018-09-27 NOTE — Progress Notes (Signed)
Subjective:    Patient ID: Denise Grant, female    DOB: 1955-09-15, 63 y.o.   MRN: [id number]  HPI Here for f/u of ED visit 09/19/18 for sob and rapid HR around 200  She called EMS at work and brought to ED Was give 2 doses of adenosine and afib/flutter was diagnosed Then given metoprolol for rate which helped   EKG reported: rate of 139 with a fib   Chest xray Dg Chest Port 1 View  Result Date: 09/19/2018 CLINICAL DATA:  Shortness of breath.  Heart palpitations. EXAM: PORTABLE CHEST 1 VIEW COMPARISON:  None. FINDINGS: External pacemaker leads present. Cardiomediastinal silhouette is normal. Mediastinal contours appear intact. There is no evidence of focal airspace consolidation, pleural effusion or pneumothorax. Osseous structures are without acute abnormality. Soft tissues are grossly normal. IMPRESSION: No active disease. Electronically Signed   By: Fidela Salisbury M.D.   On: 09/19/2018 17:57   She was then cardioverted with significant improvement of symptoms/return to sinus rhythm   Lab Results  Component Value Date   CREATININE 0.96 09/19/2018   BUN 18 09/19/2018   NA 137 09/19/2018   K 3.4 (L) 09/19/2018   CL 105 09/19/2018   CO2 17 (L) 09/19/2018   Lab Results  Component Value Date   ALT 27 09/19/2018   AST 39 09/19/2018   ALKPHOS 55 09/19/2018   BILITOT 0.9 09/19/2018    Lab Results  Component Value Date   WBC 8.8 09/19/2018   HGB 13.8 09/19/2018   HCT 40.1 09/19/2018   MCV 88.7 09/19/2018   PLT 290 09/19/2018   Lab Results  Component Value Date   TSH 1.744 09/19/2018    She was started on xarelto and ref to cardiology   Seen in a fib clinic by PA Clint Fenton on 9/4 A/P as follows:  Assessment and Plan:  1. Paroxysmal atrial fibrillation General education about afib provided and questions answered.  We also discussed her stroke risk and the risks and benefits of anticoagulation.  Patient still having daily symptoms of heart racing. She is in  SR today. ? ST vs afib vs SVT. Will check 3 day Zio patch to evaluate for arrhythmia burden. Will start Toprol 25 mg daily. Check echocardiogram Continue Xarelto 20 mg daily for at least 4 weeks post DCCV with no missed doses. We discussed that her CHADSVASC score will be 3 with her birthday in [birth year]. Long term anticoagulation would be recommended per guidelines at that point.  This patients CHA2DS2-VASc Score and unadjusted Ischemic Stroke Rate (% per year) is equal to 2.2 % stroke rate/year from a score of 2  Above score calculated as 1 point each if present [CHF, HTN, DM, Vascular=MI/PAD/Aortic Plaque, Age if 65-74, or Female] Above score calculated as 2 points each if present [Age > 75, or Stroke/TIA/TE]   2. MVP Per patient history. Check echocardiogram as above.  3. HTN Elevated today. Will start BB as above.   Today: Wt Readings from Last 3 Encounters:  09/27/18 146 lb 6 oz (66.4 kg)  09/21/18 145 lb 12.8 oz (66.1 kg)  09/19/18 147 lb 0.8 oz (66.7 kg)   23.81 kg/m   BP Readings from Last 3 Encounters:  09/27/18 (!) 148/76  09/21/18 (!) 158/76  09/19/18 116/76   Pulse Readings from Last 3 Encounters:  09/27/18 (!) 58  09/21/18 (!) 104  09/19/18 64   Cannot get her energy level back  Getting used to the metoprolol   No  family history No etoh intake  Had her echocardiogram-pending a report from cardiology  Will f/u   Had monitor removed   Appetite is coming back  Able to sleep at night   Cardiology visit is 9/18   Patient Active Problem List   Diagnosis Date Noted  . Paroxysmal atrial fibrillation ([place]) 09/27/2018  . Fatigue 09/27/2018  . Left shoulder pain 11/15/2017  . Essential hypertension 01/01/2015  . Hypothyroidism 06/25/2014  . Migraine with vertigo 05/21/2014  . History of Chiari malformation 05/21/2014  . Atrophic vaginitis 05/01/2013  . Colon cancer screening 04/20/2012  . Encounter for routine gynecological examination 11/24/2010   . Routine general medical examination at a health care facility 11/18/2010  . INSOMNIA 01/27/2010  . HEADACHE 03/31/2008  . Disorder of bone and cartilage 09/18/2007  . Generalized anxiety disorder 05/05/2006  . MITRAL VALVE PROLAPSE 05/05/2006  . ALLERGIC RHINITIS 05/05/2006  . FIBROCYSTIC BREAST DISEASE 05/05/2006  . SEBORRHEIC DERMATITIS 05/05/2006  . KERATOSIS, ACTINIC 05/05/2006  . NECK PAIN, CHRONIC 05/05/2006   Past Medical History:  Diagnosis Date  . Actinic keratosis   . Allergic rhinitis   . Anxiety   . Chiari malformation   . DDD (degenerative disc disease)   . Family history of other specified malignant neoplasm   . Headache(784.0)   . Insomnia, unspecified   . Lactose intolerance   . Mitral valve disorders(424.0)   . Osteopenia   . Seborrheic dermatitis, unspecified   . Vaginal cyst    stable   Past Surgical History:  Procedure Laterality Date  . AIR/FLUID EXCHANGE Right 07/25/2018   Procedure: Air/Fluid Exchange;  Surgeon: Jalene Mullet, MD;  Location: [facility];  Service: Ophthalmology;  Laterality: Right;  . bone spur removal  10/06   shoulder  . CATARACT EXTRACTION, BILATERAL Bilateral 2016  . [place] SURGERY  2005  . Chiari Malformation surgery  6/03  . EXCISION MORTON'S NEUROMA  2/04  . GAS INSERTION Right 07/25/2018   Procedure: Insertion Of Gas SF6;  Surgeon: Jalene Mullet, MD;  Location: [facility];  Service: Ophthalmology;  Laterality: Right;  . LUMBAR DISC SURGERY    . 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 TUBAL LIGATION     Social History   Tobacco Use  . Smoking status: Former Smoker    Quit date: 01/17/2001    Years since quitting: 17.7  . Smokeless tobacco: Never Used  Substance Use Topics  . Alcohol  use: No    Alcohol/week: 0.0 standard drinks  . Drug use: No   Family History  Problem Relation Age of Onset  . Melanoma Sister   . Lung cancer Father   . Coronary artery disease Mother   . Heart failure Mother   . Hypertension Mother   . Anxiety disorder Son   . Lung cancer Brother        + smoker  . Breast cancer Neg Hx    Allergies  Allergen Reactions  . Calcium     REACTION: cannot tolerate any calcium supplement - GI sympotms  . Fluticasone Propionate     REACTION: headache/irritation  . Hctz [Hydrochlorothiazide] Other (See Comments)    Eye redness and discomfort/ slt blurred vision   . Paroxetine     REACTION: abdominal pain and constipation   Current  Outpatient Medications on File Prior to Visit  Medication Sig Dispense Refill  . amLODipine (NORVASC) 5 MG tablet Take 1 tablet (5 mg total) by mouth daily. 90 tablet 3  . levothyroxine (SYNTHROID) 50 MCG tablet TAKE 1 TABLET BY MOUTH DAILY BEFORE BREAKFAST 90 tablet 3  . LORazepam (ATIVAN) 1 MG tablet TAKE 1/2 (ONE-HALF) TABLET BY MOUTH AT BEDTIME AS NEEDED (Patient taking differently: Take 0.5 mg by mouth daily as needed for anxiety. ) 15 tablet 3  . meclizine (ANTIVERT) 25 MG tablet Take 1 tablet (25 mg total) by mouth 3 (three) times daily as needed for dizziness. 15 tablet 0  . metoprolol succinate (TOPROL XL) 25 MG 24 hr tablet Take 1 tablet (25 mg total) by mouth daily. 30 tablet 6  . rivaroxaban (XARELTO) 20 MG TABS tablet Take 1 tablet (20 mg total) by mouth daily with supper. 30 tablet 3   No current facility-administered medications on file prior to visit.      Review of Systems  Constitutional: Positive for fatigue. Negative for activity change, appetite change, chills, diaphoresis, fever and unexpected weight change.  HENT: Negative for congestion, ear pain, rhinorrhea, sinus pressure and sore throat.   Eyes: Negative for pain, redness and visual disturbance.  Respiratory: Negative for cough, shortness of  breath and wheezing.   Cardiovascular: Positive for palpitations. Negative for chest pain.       Occ palpitation -feels like a skipped beat   Gastrointestinal: Negative for abdominal pain, blood in stool, constipation and diarrhea.  Endocrine: Negative for polydipsia and polyuria.  Genitourinary: Negative for dysuria, frequency and urgency.  Musculoskeletal: Negative for arthralgias, back pain and myalgias.  Skin: Negative for pallor and rash.  Allergic/Immunologic: Negative for environmental allergies.  Neurological: Negative for dizziness, syncope and headaches.  Hematological: Negative for adenopathy. Does not bruise/bleed easily.  Psychiatric/Behavioral: Negative for decreased concentration and dysphoric mood. The patient is not nervous/anxious.        Objective:   Physical Exam Constitutional:      General: She is not in acute distress.    Appearance: Normal appearance. She is well-developed and normal weight. She is not ill-appearing or diaphoretic.     Comments: Sluggish appearing   HENT:     Head: Normocephalic and atraumatic.     Mouth/Throat:     Mouth: Mucous membranes are moist.  Eyes:     Conjunctiva/sclera: Conjunctivae normal.     Pupils: Pupils are equal, round, and reactive to light.  Neck:     Musculoskeletal: Normal range of motion and neck supple.     Thyroid: No thyromegaly.     Vascular: No carotid bruit or JVD.  Cardiovascular:     Rate and Rhythm: Regular rhythm. Bradycardia present.     Heart sounds: Normal heart sounds. No gallop.   Pulmonary:     Effort: Pulmonary effort is normal. No respiratory distress.     Breath sounds: Normal breath sounds. No wheezing or rales.  Abdominal:     General: Bowel sounds are normal. There is no distension or abdominal bruit.     Palpations: Abdomen is soft. There is no mass.     Tenderness: There is no abdominal tenderness.  Musculoskeletal:     Right lower leg: No edema.     Left lower leg: No edema.   Lymphadenopathy:     Cervical: No cervical adenopathy.  Skin:    General: Skin is warm and dry.     Findings: No rash.  Neurological:  Mental Status: She is alert.     Coordination: Coordination normal.     Deep Tendon Reflexes: Reflexes are normal and symmetric. Reflexes normal.  Psychiatric:        Mood and Affect: Affect is flat.        Cognition and Memory: Cognition and memory normal.     Comments: Generally sluggish             Assessment & Plan:   Problem List Items Addressed This Visit      Cardiovascular and Mediastinum   Essential hypertension    bp remains stable with addn of metoprolol (for rate control with a fib) bp in fair control at this time  BP Readings from Last 1 Encounters:  09/27/18 136/80   No changes needed Most recent labs reviewed  Disc lifstyle change with low sodium diet and exercise        Paroxysmal atrial fibrillation ([place]) - Primary    Reviewed hospital records, lab results and studies in detail  Was cardioverted in ED In [facility] since  Mildly bradycardic on metoprolol (also causing some fatigue)  Will have to watch this Continues xarelto for CVA prev  For cardiology f/u soon to disc monitor and echo  May need to cut down/change beta blocker due to fatigue         Endocrine   Hypothyroidism    Lab Results  Component Value Date   TSH 1.744 09/19/2018   done in ED during a fib No change in levothy dose         Other   Fatigue    S/p cardioversion for a fib  Suspect pt is adjusting to her metoprolol  Will f/u with cardiology next week to disc  Rev s/s of orthostatic hypotension to watch for        Other Visit Diagnoses    Need for influenza vaccination       Relevant Orders   Flu Vaccine QUAD 6+ mos PF IM (Fluarix Quad PF) (Completed)

## 2018-09-27 NOTE — Assessment & Plan Note (Signed)
Reviewed hospital records, lab results and studies in detail  Was cardioverted in ED In [facility] since  Mildly bradycardic on metoprolol (also causing some fatigue)  Will have to watch this Continues xarelto for CVA prev  For cardiology f/u soon to disc monitor and echo  May need to cut down/change beta blocker due to fatigue

## 2018-09-27 NOTE — Patient Instructions (Signed)
Continue your current medicines  Let's keep you out of work until after your cardiology follow up.  Get some rest  If you feel worse- call the cardiology office   Blood pressure and pulse are ok today

## 2018-09-27 NOTE — Assessment & Plan Note (Signed)
Lab Results  Component Value Date   TSH 1.744 09/19/2018   done in ED during a fib No change in levothy dose

## 2018-10-05 ENCOUNTER — Encounter (HOSPITAL_COMMUNITY): Payer: Self-pay | Admitting: Physician Assistant

## 2018-10-05 ENCOUNTER — Ambulatory Visit (HOSPITAL_COMMUNITY)
Admission: RE | Admit: 2018-10-05 | Discharge: 2018-10-05 | Disposition: A | Discharge status: Home / Self Care | Payer: 59 | Source: Ambulatory Visit | Attending: Physician Assistant | Admitting: Physician Assistant

## 2018-10-05 ENCOUNTER — Other Ambulatory Visit: Payer: Self-pay

## 2018-10-05 VITALS — BP 146/80 | HR 67 | Ht 66.0 in | Wt 147.2 lb

## 2018-10-05 DIAGNOSIS — Z801 Family history of malignant neoplasm of trachea, bronchus and lung: Secondary | ICD-10-CM | POA: Insufficient documentation

## 2018-10-05 DIAGNOSIS — F411 Generalized anxiety disorder: Secondary | ICD-10-CM | POA: Insufficient documentation

## 2018-10-05 DIAGNOSIS — R001 Bradycardia, unspecified: Secondary | ICD-10-CM | POA: Insufficient documentation

## 2018-10-05 DIAGNOSIS — Z7901 Long term (current) use of anticoagulants: Secondary | ICD-10-CM | POA: Insufficient documentation

## 2018-10-05 DIAGNOSIS — I480 Paroxysmal atrial fibrillation: Secondary | ICD-10-CM

## 2018-10-05 DIAGNOSIS — E739 Lactose intolerance, unspecified: Secondary | ICD-10-CM | POA: Diagnosis not present

## 2018-10-05 DIAGNOSIS — I341 Nonrheumatic mitral (valve) prolapse: Secondary | ICD-10-CM | POA: Diagnosis not present

## 2018-10-05 DIAGNOSIS — I10 Essential (primary) hypertension: Secondary | ICD-10-CM | POA: Diagnosis not present

## 2018-10-05 DIAGNOSIS — E039 Hypothyroidism, unspecified: Secondary | ICD-10-CM | POA: Insufficient documentation

## 2018-10-05 DIAGNOSIS — Z818 Family history of other mental and behavioral disorders: Secondary | ICD-10-CM | POA: Diagnosis not present

## 2018-10-05 DIAGNOSIS — Z79890 Hormone replacement therapy (postmenopausal): Secondary | ICD-10-CM | POA: Insufficient documentation

## 2018-10-05 DIAGNOSIS — Z87891 Personal history of nicotine dependence: Secondary | ICD-10-CM | POA: Diagnosis not present

## 2018-10-05 DIAGNOSIS — Z888 Allergy status to other drugs, medicaments and biological substances status: Secondary | ICD-10-CM | POA: Insufficient documentation

## 2018-10-05 DIAGNOSIS — Z8249 Family history of ischemic heart disease and other diseases of the circulatory system: Secondary | ICD-10-CM | POA: Diagnosis not present

## 2018-10-05 DIAGNOSIS — Z808 Family history of malignant neoplasm of other organs or systems: Secondary | ICD-10-CM | POA: Diagnosis not present

## 2018-10-05 DIAGNOSIS — R9431 Abnormal electrocardiogram [ECG] [EKG]: Secondary | ICD-10-CM | POA: Insufficient documentation

## 2018-10-05 DIAGNOSIS — Z79899 Other long term (current) drug therapy: Secondary | ICD-10-CM | POA: Diagnosis not present

## 2018-10-05 MED ORDER — METOPROLOL SUCCINATE ER 25 MG PO TB24: 12.5000 mg | ORAL_TABLET | Freq: Every day | ORAL | 6 refills | Status: DC

## 2018-10-05 NOTE — Progress Notes (Signed)
Primary Care Physician: Tower, Wynelle Fanny, MD Primary Cardiologist: none Primary Electrophysiologist: none Referring Physician: [facility]   Denise Grant is a 63 y.o. female with a history of Chiari malformation, HTN, hypothyroidism, MVP, generalized anxiety, and paroxysmal atrial fibrillation who presents for consultation in the [facility].  The patient was initially diagnosed with atrial fibrillation on 09/19/18 after presenting to the ER with symptoms of SOB, chest discomfort, and weakness. She was found to be in afib with RVR and underwent successful DCCV. There were no specific triggers that the patient could identify. She denies snoring or significant alcohol use.   On follow up today, patient reports that she has done reasonably well. She has been fatigued but this has slowly improved. She denies palpitations or heart racing. She had an echo which showed preserved EF and a Zio patch which showed no afib. She is tolerating the Xarelto with no bleeding issues.    Today, she denies symptoms of palpitations, chest pain, shortness of breath, orthopnea, PND, lower extremity edema, dizziness, presyncope, syncope, snoring, daytime somnolence, bleeding, or neurologic sequela. The patient is tolerating medications without difficulties and is otherwise without complaint today.    Atrial Fibrillation Risk Factors:  she does not have symptoms or diagnosis of sleep apnea. she does not have a history of rheumatic fever. she does not have a history of alcohol use. The patient does have a history of early familial atrial fibrillation or other arrhythmias. Sister has afib, brother has PPM.  she has a BMI of Body mass index is 23.76 kg/m.Marland Kitchen Filed Weights   10/05/18 1026  Weight: 66.8 kg    Family History  Problem Relation Age of Onset  . Melanoma Sister   . Lung cancer Father   . Coronary artery disease Mother   . Heart failure Mother   . Hypertension Mother    . Anxiety disorder Son   . Lung cancer Brother        + smoker  . Breast cancer Neg Hx      Atrial Fibrillation Management history:  Previous antiarrhythmic drugs: none Previous cardioversions: 09/19/18 Previous ablations: none CHADS2VASC score: 2 Anticoagulation history: Xarelto   Past Medical History:  Diagnosis Date  . Actinic keratosis   . Allergic rhinitis   . Anxiety   . Chiari malformation   . DDD (degenerative disc disease)   . Family history of other specified malignant neoplasm   . Headache(784.0)   . Insomnia, unspecified   . Lactose intolerance   . Mitral valve disorders(424.0)   . Osteopenia   . Seborrheic dermatitis, unspecified   . Vaginal cyst    stable   Past Surgical History:  Procedure Laterality Date  . AIR/FLUID EXCHANGE Right 07/25/2018   Procedure: Air/Fluid Exchange;  Surgeon: Jalene Mullet, MD;  Location: [facility];  Service: Ophthalmology;  Laterality: Right;  . bone spur removal  10/06   shoulder  . CATARACT EXTRACTION, BILATERAL Bilateral 2016  . [place] SURGERY  2005  . Chiari Malformation surgery  6/03  . EXCISION MORTON'S NEUROMA  2/04  . GAS INSERTION Right 07/25/2018   Procedure: Insertion Of Gas SF6;  Surgeon: Jalene Mullet, MD;  Location: [facility];  Service: Ophthalmology;  Laterality: Right;  . LUMBAR DISC SURGERY    . 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 TUBAL LIGATION      Current Outpatient Medications  Medication Sig Dispense Refill  . amLODipine (NORVASC) 5 MG tablet Take 1 tablet (5 mg total) by mouth daily. 90 tablet 3  . levothyroxine (SYNTHROID) 50 MCG tablet TAKE 1 TABLET BY MOUTH DAILY BEFORE BREAKFAST 90 tablet 3  . LORazepam (ATIVAN) 1 MG tablet TAKE 1/2 (ONE-HALF) TABLET BY MOUTH  AT BEDTIME AS NEEDED (Patient taking differently: Take 0.5 mg by mouth daily as needed for anxiety. ) 15 tablet 3  . meclizine (ANTIVERT) 25 MG tablet Take 1 tablet (25 mg total) by mouth 3 (three) times daily as needed for dizziness. 15 tablet 0  . metoprolol succinate (TOPROL XL) 25 MG 24 hr tablet Take 0.5 tablets (12.5 mg total) by mouth daily. 30 tablet 6  . rivaroxaban (XARELTO) 20 MG TABS tablet Take 1 tablet (20 mg total) by mouth daily with supper. 30 tablet 3   No current facility-administered medications for this encounter.     Allergies  Allergen Reactions  . Calcium     REACTION: cannot tolerate any calcium supplement - GI sympotms  . Fluticasone Propionate     REACTION: headache/irritation  . Hctz [Hydrochlorothiazide] Other (See Comments)    Eye redness and discomfort/ slt blurred vision   . Paroxetine     REACTION: abdominal pain and constipation    Social History   Socioeconomic History  . Marital status: Married    Spouse name: Not on file  . Number of children: Not on file  . Years of education: Not on file  . Highest education level: Not on file  Occupational History  . Not on file  Social Needs  . Financial resource strain: Not on file  . Food insecurity    Worry: Not on file    Inability: Not on file  . Transportation needs    Medical: Not on file    Non-medical: Not on file  Tobacco Use  . Smoking status: Former Smoker    Quit date: 01/17/2001    Years since quitting: 17.7  . Smokeless tobacco: Never Used  Substance and Sexual Activity  . Alcohol use: No    Alcohol/week: 0.0 standard drinks  . Drug use: No  . Sexual activity: Not on file  Lifestyle  . Physical activity    Days per week: Not on file    Minutes per session: Not on file  . Stress: Not on file  Relationships  . Social Herbalist on phone: Not on file    Gets together: Not on file    Attends religious service: Not on file    Active member of club or organization: Not  on file    Attends meetings of clubs or organizations: Not on file    Relationship status: Not on file  . Intimate partner violence    Fear of current or ex partner: Not on file    Emotionally abused: Not on file    Physically abused: Not on file    Forced sexual activity: Not on file  Other Topics Concern  . Not on file  Social History Narrative   Walks daily for exercise      Married--husband diagnosed with stomach cancer (5/11)   Lost her husband 4/12 to cancer      ROS- All systems are reviewed and negative except as per the HPI above.  Physical Exam: Vitals:   10/05/18  1026  BP: (!) 146/80  Pulse: 67  Weight: 66.8 kg  Height: 5\' 6"  (1.676 m)    GEN- The patient is well appearing, alert and oriented x 3 today.   HEENT-head normocephalic, atraumatic, sclera clear, conjunctiva pink, hearing intact, trachea midline. Lungs- Clear to ausculation bilaterally, normal work of breathing Heart- Regular rate and rhythm, no murmurs, rubs or gallops  GI- soft, NT, ND, + BS Extremities- no clubbing, cyanosis, or edema MS- no significant deformity or atrophy Skin- no rash or lesion Psych- euthymic mood, full affect Neuro- strength and sensation are intact   Wt Readings from Last 3 Encounters:  10/05/18 66.8 kg  09/27/18 66.4 kg  09/21/18 66.1 kg    EKG today demonstrates SR HR 67, PR 150, QRS 72, QTc 414  Echo 09/26/18 1. The left ventricle has normal systolic function with an ejection fraction of 60-65%. The cavity size was normal. Left ventricular diastolic parameters were normal. No evidence of left ventricular regional wall motion abnormalities.  2. The right ventricle has normal systolic function. The cavity was normal. There is no increase in right ventricular wall thickness. Right ventricular systolic pressure is normal with an estimated pressure of 21.8 mmHg.  3. The pericardial effusion is circumferential.  4. Trivial pericardial effusion is present.  5. Mild  thickening of the mitral valve leaflet. There is mild mitral annular calcification present.  6. Mild thickening of the aortic valve. Aortic valve regurgitation is mild by color flow Doppler.  7. The aorta is normal unless otherwise noted.  Epic records are reviewed at length today  Assessment and Plan:  1. Paroxysmal atrial fibrillation Zio patch showed no afib with only one brief episode of SVT (4 beats) Will decrease Toprol to 12.5 mg daily 2/2 fatigue.  Continue Xarelto 20 mg daily for at least 4 weeks (9/30) post DCCV with no missed doses.  We discussed that her CHADSVASC score will be 3 with her birthday in [birth year]. Patient would prefer to continue anticoagulation beyond the 4 weeks.  This patients CHA2DS2-VASc Score and unadjusted Ischemic Stroke Rate (% per year) is equal to 2.2 % stroke rate/year from a score of 2  Above score calculated as 1 point each if present [CHF, HTN, DM, Vascular=MI/PAD/Aortic Plaque, Age if 65-74, or Female] Above score calculated as 2 points each if present [Age > 75, or Stroke/TIA/TE]   2. MVP Per patient history. Recent echo showed no MVP.  3. HTN Stable, no changes today.    Follow up in the AF clinic in 3 months.   [facility] [address] [place], [place] [address] [phone] 10/05/2018 11:43 AM

## 2018-10-12 ENCOUNTER — Telehealth: Payer: Self-pay | Admitting: *Deleted

## 2018-10-12 MED ORDER — FAMOTIDINE 40 MG PO TABS: 40.0000 mg | ORAL_TABLET | Freq: Every day | ORAL | 11 refills | Status: DC

## 2018-10-12 NOTE — Telephone Encounter (Signed)
Pt notified of Dr. Marliss Coots comments. Pt said she is having a lot of GERD. It didn't start until she was put on the xarelto and metoprolol but she read that she can't take an antiacid for up to 4 hrs after taking the synthroid so that's why she was asking to stop the synthroid because she wants to be able to take some type of antiacid med due to the increase of GERD

## 2018-10-12 NOTE — Telephone Encounter (Signed)
Patient left a voicemail stating that she wants to know if she can stop her thyroid medication because she thinks that it is causing her to have indigestion?

## 2018-10-12 NOTE — Telephone Encounter (Signed)
I doubt the GERD is related to her medicines but it needs to be treated   I pended a px for pepcid to send to her pharmacy-please send to preferred pharmacy  Take it at bedtime  Let me know in a week or so if it is helping Goal is to make her not need the antacid  Also avoid foods that make her worse- like tomato sauce/spice

## 2018-10-12 NOTE — Telephone Encounter (Signed)
I would not recommend stopping it.  What kind of GI symptoms and what is the timing with medication dosing? Thanks

## 2018-10-12 NOTE — Telephone Encounter (Signed)
Pt notified of all of Dr. Marliss Coots recommendations and verbalized understanding. Rx sent to pharmacy and she will update [place] in a week

## 2018-10-19 ENCOUNTER — Telehealth: Payer: Self-pay | Admitting: *Deleted

## 2018-10-19 NOTE — Telephone Encounter (Signed)
Patient left a voicemail stating that the indigestion  medication that you sent in for her has worked great. Patient stated that she only had to take 3 pills and she is not having any problems with indigestion now.

## 2018-10-21 NOTE — Telephone Encounter (Signed)
Aware Thanks for the update, glad it helped

## 2018-11-05 ENCOUNTER — Other Ambulatory Visit: Payer: Self-pay | Admitting: Family Medicine

## 2018-11-06 NOTE — Telephone Encounter (Signed)
Name of Medication: ativan Name of Pharmacy: [facility] [address] Last Fill or Written Date and Quantity: 07/13/18 #15 tabs with 3 refills Last Office Visit and Type: Hospital f/u on 09/27/18 Next Office Visit and Type: CPE on 08/30/19

## 2018-11-14 ENCOUNTER — Other Ambulatory Visit (HOSPITAL_COMMUNITY): Payer: Self-pay

## 2018-11-14 MED ORDER — RIVAROXABAN 20 MG PO TABS: 20.0000 mg | ORAL_TABLET | Freq: Every day | ORAL | 3 refills | Status: DC

## 2019-01-01 ENCOUNTER — Other Ambulatory Visit (HOSPITAL_COMMUNITY): Payer: Self-pay | Admitting: Physician Assistant

## 2019-01-03 ENCOUNTER — Ambulatory Visit (HOSPITAL_COMMUNITY)
Admission: RE | Admit: 2019-01-03 | Discharge: 2019-01-03 | Disposition: A | Discharge status: Home / Self Care | Payer: 59 | Source: Ambulatory Visit | Attending: Physician Assistant | Admitting: Physician Assistant

## 2019-01-03 ENCOUNTER — Encounter (HOSPITAL_COMMUNITY): Payer: Self-pay | Admitting: Physician Assistant

## 2019-01-03 ENCOUNTER — Other Ambulatory Visit: Payer: Self-pay

## 2019-01-03 VITALS — BP 140/76 | HR 73 | Ht 66.0 in | Wt 148.2 lb

## 2019-01-03 DIAGNOSIS — I341 Nonrheumatic mitral (valve) prolapse: Secondary | ICD-10-CM | POA: Diagnosis not present

## 2019-01-03 DIAGNOSIS — Z79899 Other long term (current) drug therapy: Secondary | ICD-10-CM | POA: Insufficient documentation

## 2019-01-03 DIAGNOSIS — I10 Essential (primary) hypertension: Secondary | ICD-10-CM | POA: Insufficient documentation

## 2019-01-03 DIAGNOSIS — Z7901 Long term (current) use of anticoagulants: Secondary | ICD-10-CM | POA: Insufficient documentation

## 2019-01-03 DIAGNOSIS — Z049 Encounter for examination and observation for unspecified reason: Secondary | ICD-10-CM | POA: Diagnosis not present

## 2019-01-03 DIAGNOSIS — Z87798 Personal history of other (corrected) congenital malformations: Secondary | ICD-10-CM | POA: Insufficient documentation

## 2019-01-03 DIAGNOSIS — I480 Paroxysmal atrial fibrillation: Secondary | ICD-10-CM

## 2019-01-03 DIAGNOSIS — E039 Hypothyroidism, unspecified: Secondary | ICD-10-CM | POA: Insufficient documentation

## 2019-01-03 DIAGNOSIS — Z87891 Personal history of nicotine dependence: Secondary | ICD-10-CM | POA: Insufficient documentation

## 2019-01-03 LAB — BASIC METABOLIC PANEL
Anion gap: 6 (ref 5–15)
BUN: 11 mg/dL (ref 8–23)
CO2: 27 mmol/L (ref 22–32)
Calcium: 9.5 mg/dL (ref 8.9–10.3)
Chloride: 105 mmol/L (ref 98–111)
Creatinine, Ser: 0.94 mg/dL (ref 0.44–1.00)
GFR calc Af Amer: >60 mL/min (ref >60)
GFR calc non Af Amer: >60 mL/min (ref >60)
Glucose, Bld: 95 mg/dL (ref 70–99)
Potassium: 5 mmol/L (ref 3.5–5.1)
Sodium: 138 mmol/L (ref 135–145)

## 2019-01-03 LAB — CBC
HCT: 41 % (ref 36.0–46.0)
Hemoglobin: 14 g/dL (ref 12.0–15.0)
MCH: 30.7 pg (ref 26.0–34.0)
MCHC: 34.1 g/dL (ref 30.0–36.0)
MCV: 89.9 fL (ref 80.0–100.0)
Platelets: 210 10*3/uL (ref 150–400)
RBC: 4.56 MIL/uL (ref 3.87–5.11)
RDW: 12.6 % (ref 11.5–15.5)
WBC: 4.5 10*3/uL (ref 4.0–10.5)
nRBC: 0 % (ref 0.0–0.2)

## 2019-01-03 NOTE — Progress Notes (Signed)
Primary Care Physician: Tower, Wynelle Fanny, MD Primary Cardiologist: none Primary Electrophysiologist: none Referring Physician: [facility]   Denise Grant is a 63 y.o. female with a history of Chiari malformation, HTN, hypothyroidism, MVP, generalized anxiety, and paroxysmal atrial fibrillation who presents for follow up in the [facility].  The patient was initially diagnosed with atrial fibrillation on 09/19/18 after presenting to the ER with symptoms of SOB, chest discomfort, and weakness. She was found to be in afib with RVR and underwent successful DCCV. There were no specific triggers that the patient could identify. She denies snoring or significant alcohol use. She had an echo 09/26/18 which showed preserved EF and a Zio patch 10/01/18 which showed no afib.   On follow up today, patient reports that she has done well since her last visit. She has not had any heart racing or palpitations. She does admit to some SOB when she gets home from work in the evening. This resolves with lorazapam. Her fatigue has improved on the lower dose of BB.    Today, she denies symptoms of palpitations, chest pain, shortness of breath, orthopnea, PND, lower extremity edema, dizziness, presyncope, syncope, snoring, daytime somnolence, bleeding, or neurologic sequela. The patient is tolerating medications without difficulties and is otherwise without complaint today.    Atrial Fibrillation Risk Factors:  she does not have symptoms or diagnosis of sleep apnea. she does not have a history of rheumatic fever. she does not have a history of alcohol use. The patient does have a history of early familial atrial fibrillation or other arrhythmias. Sister has afib, brother has PPM.  she has a BMI of Body mass index is 23.92 kg/m.Marland Kitchen Filed Weights   01/03/19 1000  Weight: 67.2 kg    Family History  Problem Relation Age of Onset  . Melanoma Sister   . Lung cancer Father   . Coronary  artery disease Mother   . Heart failure Mother   . Hypertension Mother   . Anxiety disorder Son   . Lung cancer Brother        + smoker  . Breast cancer Neg Hx      Atrial Fibrillation Management history:  Previous antiarrhythmic drugs: none Previous cardioversions: 09/19/18 Previous ablations: none CHADS2VASC score: 2 Anticoagulation history: Xarelto   Past Medical History:  Diagnosis Date  . Actinic keratosis   . Allergic rhinitis   . Anxiety   . Chiari malformation   . DDD (degenerative disc disease)   . Family history of other specified malignant neoplasm   . Headache(784.0)   . Insomnia, unspecified   . Lactose intolerance   . Mitral valve disorders(424.0)   . Osteopenia   . Seborrheic dermatitis, unspecified   . Vaginal cyst    stable   Past Surgical History:  Procedure Laterality Date  . AIR/FLUID EXCHANGE Right 07/25/2018   Procedure: Air/Fluid Exchange;  Surgeon: Jalene Mullet, MD;  Location: [facility];  Service: Ophthalmology;  Laterality: Right;  . bone spur removal  10/06   shoulder  . CATARACT EXTRACTION, BILATERAL Bilateral 2016  . [place] SURGERY  2005  . Chiari Malformation surgery  6/03  . EXCISION MORTON'S NEUROMA  2/04  . GAS INSERTION Right 07/25/2018   Procedure: Insertion Of Gas SF6;  Surgeon: Jalene Mullet, MD;  Location: [facility];  Service: Ophthalmology;  Laterality: Right;  . LUMBAR DISC SURGERY    . 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 TUBAL LIGATION      Current Outpatient Medications  Medication Sig Dispense Refill  . amLODipine (NORVASC) 5 MG tablet Take 1 tablet (5 mg total) by mouth daily. 90 tablet 3  . famotidine (PEPCID) 40 MG tablet Take 1 tablet (40 mg total) by mouth at bedtime. 30 tablet  11  . levothyroxine (SYNTHROID) 50 MCG tablet TAKE 1 TABLET BY MOUTH DAILY BEFORE BREAKFAST 90 tablet 3  . LORazepam (ATIVAN) 1 MG tablet TAKE 1/2 (ONE-HALF) TABLET BY MOUTH AT BEDTIME AS NEEDED 15 tablet 3  . meclizine (ANTIVERT) 25 MG tablet Take 1 tablet (25 mg total) by mouth 3 (three) times daily as needed for dizziness. 15 tablet 0  . metoprolol succinate (TOPROL-XL) 25 MG 24 hr tablet TAKE 1 TABLET BY MOUTH EVERY DAY (Patient taking differently: 12.5 mg. ) 90 tablet 2  . rivaroxaban (XARELTO) 20 MG TABS tablet Take 1 tablet (20 mg total) by mouth daily with supper. 30 tablet 3   No current facility-administered medications for this encounter.    Allergies  Allergen Reactions  . Calcium     REACTION: cannot tolerate any calcium supplement - GI sympotms  . Fluticasone Propionate     REACTION: headache/irritation  . Hctz [Hydrochlorothiazide] Other (See Comments)    Eye redness and discomfort/ slt blurred vision   . Paroxetine     REACTION: abdominal pain and constipation    Social History   Socioeconomic History  . Marital status: Married    Spouse name: Not on file  . Number of children: Not on file  . Years of education: Not on file  . Highest education level: Not on file  Occupational History  . Not on file  Tobacco Use  . Smoking status: Former Smoker    Quit date: 01/17/2001    Years since quitting: 17.9  . Smokeless tobacco: Never Used  Substance and Sexual Activity  . Alcohol use: No    Alcohol/week: 0.0 standard drinks  . Drug use: No  . Sexual activity: Not on file  Other Topics Concern  . Not on file  Social History Narrative   Walks daily for exercise      Married--husband diagnosed with stomach cancer (5/11)   Lost her husband 4/12 to cancer    Social Determinants of Health   Financial Resource Strain:   . Difficulty of Paying Living Expenses: Not on file  Food Insecurity:   . Worried About Charity fundraiser in the Last Year: Not on file  . Ran  Out of Food in the Last Year: Not on file  Transportation Needs:   . Lack of Transportation (Medical): Not on file  . Lack of Transportation (Non-Medical): Not on file  Physical Activity:   . Days of Exercise per Week: Not on file  . Minutes of Exercise per Session: Not on file  Stress:   . Feeling of Stress : Not on file  Social Connections:   . Frequency of Communication with Friends and Family: Not on file  . Frequency of Social Gatherings with Friends and Family: Not on file  . Attends Religious Services: Not on file  . Active Member of Clubs or Organizations: Not on file  . Attends Archivist Meetings: Not on file  . Marital Status: Not on file  Intimate  Partner Violence:   . Fear of Current or Ex-Partner: Not on file  . Emotionally Abused: Not on file  . Physically Abused: Not on file  . Sexually Abused: Not on file     ROS- All systems are reviewed and negative except as per the HPI above.  Physical Exam: Vitals:   01/03/19 1000  BP: 140/76  Pulse: 73  Weight: 67.2 kg  Height: 5\' 6"  (1.676 m)    GEN- The patient is well appearing, alert and oriented x 3 today.   HEENT-head normocephalic, atraumatic, sclera clear, conjunctiva pink, hearing intact, trachea midline. Lungs- Clear to ausculation bilaterally, normal work of breathing Heart- Regular rate and rhythm, no murmurs, rubs or gallops  GI- soft, NT, ND, + BS Extremities- no clubbing, cyanosis, or edema MS- no significant deformity or atrophy Skin- no rash or lesion Psych- euthymic mood, full affect Neuro- strength and sensation are intact   Wt Readings from Last 3 Encounters:  01/03/19 67.2 kg  10/05/18 66.8 kg  09/27/18 66.4 kg    EKG today demonstrates SR HR 73, PR 154, QRS 68, QTc 420  Echo 09/26/18 1. The left ventricle has normal systolic function with an ejection fraction of 60-65%. The cavity size was normal. Left ventricular diastolic parameters were normal. No evidence of left  ventricular regional wall motion abnormalities.  2. The right ventricle has normal systolic function. The cavity was normal. There is no increase in right ventricular wall thickness. Right ventricular systolic pressure is normal with an estimated pressure of 21.8 mmHg.  3. The pericardial effusion is circumferential.  4. Trivial pericardial effusion is present.  5. Mild thickening of the mitral valve leaflet. There is mild mitral annular calcification present.  6. Mild thickening of the aortic valve. Aortic valve regurgitation is mild by color flow Doppler.  7. The aorta is normal unless otherwise noted.  Epic records are reviewed at length today  Assessment and Plan:  1. Paroxysmal atrial fibrillation Patient appears to be maintaining SR. Continue Toprol to 12.5 mg daily Continue Xarelto 20 mg daily. Recall discussion that her CHADSVASC score will be 3 with her birthday in [birth year]. Check Bmet/CBC today.   This patients CHA2DS2-VASc Score and unadjusted Ischemic Stroke Rate (% per year) is equal to 2.2 % stroke rate/year from a score of 2  Above score calculated as 1 point each if present [CHF, HTN, DM, Vascular=MI/PAD/Aortic Plaque, Age if 65-74, or Female] Above score calculated as 2 points each if present [Age > 75, or Stroke/TIA/TE]   2. MVP Per patient history, no MVP seen on recent echo.  3. HTN Stable, no changes today.   Follow up in the AF clinic in 6 months.   [facility] [address] [place], [place] [address] [phone] 01/03/2019 10:27 AM

## 2019-02-22 ENCOUNTER — Encounter: Payer: Self-pay | Admitting: Family Medicine

## 2019-02-22 ENCOUNTER — Other Ambulatory Visit: Payer: Self-pay

## 2019-02-22 ENCOUNTER — Ambulatory Visit: Payer: 59 | Admitting: Family Medicine

## 2019-02-22 VITALS — BP 140/70 | HR 77 | Temp 97.8°F | Ht 66.0 in | Wt 151.1 lb

## 2019-02-22 DIAGNOSIS — M21611 Bunion of right foot: Secondary | ICD-10-CM | POA: Diagnosis not present

## 2019-02-22 DIAGNOSIS — M21612 Bunion of left foot: Secondary | ICD-10-CM | POA: Diagnosis not present

## 2019-02-22 DIAGNOSIS — I480 Paroxysmal atrial fibrillation: Secondary | ICD-10-CM | POA: Diagnosis not present

## 2019-02-22 DIAGNOSIS — M519 Unspecified thoracic, thoracolumbar and lumbosacral intervertebral disc disorder: Secondary | ICD-10-CM | POA: Diagnosis not present

## 2019-02-22 NOTE — Assessment & Plan Note (Signed)
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

## 2019-02-22 NOTE — Patient Instructions (Signed)
Wear shoes that are wide enough for bunions and correct for over pronation to protect your back  I wrote a work note   Let us know if that does not help

## 2019-02-22 NOTE — Assessment & Plan Note (Signed)
No clinical changes 

## 2019-02-22 NOTE — Progress Notes (Signed)
Subjective:    Patient ID: Denise Grant, female    DOB: 06-Apr-1955, 64 y.o.   MRN: [id number]  This visit occurred during the SARS-CoV-2 public health emergency.  Safety protocols were in place, including screening questions prior to the visit, additional usage of staff PPE, and extensive cleaning of exam room while observing appropriate contact time as indicated for disinfecting solutions.    HPI Pt presents for back pain  Also struggling with her work shoes   Wt Readings from Last 3 Encounters:  02/22/19 151 lb 1 oz (68.5 kg)  01/03/19 148 lb 3.2 oz (67.2 kg)  10/05/18 147 lb 3.2 oz (66.8 kg)   24.38 kg/m    Has to wear steel toed shoes at work  They hurt her feet with bunions  Also they make her back hurt   They have to wear the shoes they give her (not wide enough)  She is not allowed to wear her own shoes  She has tried all sorts of inserts   Is also looking for another job  Works at [employer] and plans to retire soon   Back pain - lower  She has had surgery on low back - ruptured disc -- about 20 y ago  Unsure which procedure she had (not a fusion)   Usually wears athletic shoes- Brooks/[employer]  No problems   No meds for pain  No radiation to her leg   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 Lumbar disc disease   . Bilateral bunions   . Paroxysmal atrial fibrillation ([place]) 09/27/2018  . Fatigue 09/27/2018  . Left shoulder pain 11/15/2017  . Essential hypertension 01/01/2015  . Hypothyroidism 06/25/2014  . Migraine with vertigo 05/21/2014  . History of Chiari malformation 05/21/2014  . Atrophic vaginitis 05/01/2013  . Colon cancer screening 04/20/2012  . Encounter for routine gynecological examination 11/24/2010  . Routine general medical examination at a health care facility 11/18/2010  . INSOMNIA 01/27/2010  .  HEADACHE 03/31/2008  . Disorder of bone and cartilage 09/18/2007  . Generalized anxiety disorder 05/05/2006  . MITRAL VALVE PROLAPSE 05/05/2006  . ALLERGIC RHINITIS 05/05/2006  . FIBROCYSTIC BREAST DISEASE 05/05/2006  . SEBORRHEIC DERMATITIS 05/05/2006  . KERATOSIS, ACTINIC 05/05/2006  . NECK PAIN, CHRONIC 05/05/2006   Past Medical History:  Diagnosis Date  . Actinic keratosis   . Allergic rhinitis   . Anxiety   . Chiari malformation   . DDD (degenerative disc disease)   . Family history of other specified malignant neoplasm   . Headache(784.0)   . Insomnia, unspecified   . Lactose intolerance   . Mitral valve disorders(424.0)   . Osteopenia   . Seborrheic dermatitis, unspecified   . Vaginal cyst    stable   Past Surgical History:  Procedure Laterality Date  . AIR/FLUID EXCHANGE Right 07/25/2018   Procedure: Air/Fluid Exchange;  Surgeon: Jalene Mullet, MD;  Location: [facility];  Service: Ophthalmology;  Laterality: Right;  . bone spur removal  10/06   shoulder  . CATARACT EXTRACTION, BILATERAL Bilateral 2016  . [place] SURGERY  2005  . Chiari Malformation surgery  6/03  . EXCISION MORTON'S NEUROMA  2/04  . GAS INSERTION Right 07/25/2018   Procedure: Insertion Of Gas SF6;  Surgeon: Jalene Mullet, MD;  Location: [facility];  Service: Ophthalmology;  Laterality: Right;  . LUMBAR DISC SURGERY    . 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 TUBAL LIGATION     Social History   Tobacco Use  . Smoking status: Former Smoker    Quit date: 01/17/2001    Years since quitting: 18.1  . Smokeless tobacco: Never Used  Substance Use Topics  . Alcohol use: No    Alcohol/week: 0.0 standard drinks  . Drug use: No   Family History  Problem Relation Age of  Onset  . Melanoma Sister   . Lung cancer Father   . Coronary artery disease Mother   . Heart failure Mother   . Hypertension Mother   . Anxiety disorder Son   . Lung cancer Brother        + smoker  . Breast cancer Neg Hx    Allergies  Allergen Reactions  . Calcium     REACTION: cannot tolerate any calcium supplement - GI sympotms  . Fluticasone Propionate     REACTION: headache/irritation  . Hctz [Hydrochlorothiazide] Other (See Comments)    Eye redness and discomfort/ slt blurred vision   . Paroxetine     REACTION: abdominal pain and constipation   Current Outpatient Medications on File Prior to Visit  Medication Sig Dispense Refill  . amLODipine (NORVASC) 5 MG tablet Take 1 tablet (5 mg total) by mouth daily. 90 tablet 3  . famotidine (PEPCID) 40 MG tablet Take 1 tablet (40 mg total) by mouth at bedtime. 30 tablet 11  . levothyroxine (SYNTHROID) 50 MCG tablet TAKE 1 TABLET BY MOUTH DAILY BEFORE BREAKFAST 90 tablet 3  . LORazepam (ATIVAN) 1 MG tablet TAKE 1/2 (ONE-HALF) TABLET BY MOUTH AT BEDTIME AS NEEDED 15 tablet 3  . meclizine (ANTIVERT) 25 MG tablet Take 1 tablet (25 mg total) by mouth 3 (three) times daily as needed for dizziness. 15 tablet 0  . metoprolol succinate (TOPROL-XL) 25 MG 24 hr tablet TAKE 1 TABLET BY MOUTH EVERY DAY (Patient taking differently: 12.5 mg. ) 90 tablet 2  . rivaroxaban (XARELTO) 20 MG TABS tablet Take 1 tablet (20 mg total) by mouth daily with supper. 30 tablet 3   No current facility-administered medications on file prior to visit.     Review of Systems  Constitutional: Negative for activity change, appetite change, fatigue, fever and unexpected weight change.  HENT: Negative for congestion, ear pain, rhinorrhea, sinus pressure and sore throat.   Eyes: Negative for pain, redness and visual disturbance.  Respiratory: Negative for cough, shortness of breath and wheezing.   Cardiovascular: Negative for chest pain and palpitations.    Gastrointestinal: Negative for abdominal pain, blood in stool, constipation and diarrhea.  Endocrine: Negative for polydipsia and polyuria.  Genitourinary: Negative for dysuria, frequency and urgency.  Musculoskeletal: Positive for arthralgias and back pain. Negative for myalgias.  Skin: Negative for pallor and rash.  Allergic/Immunologic: Negative for environmental allergies.  Neurological: Negative for dizziness, syncope and headaches.  Hematological: Negative for adenopathy. Does not bruise/bleed easily.  Psychiatric/Behavioral: Negative for decreased concentration and dysphoric mood. The patient is not nervous/anxious.        Objective:   Physical Exam Constitutional:      General: She is not in acute distress.    Appearance: Normal appearance. She is  normal weight. She is not ill-appearing or diaphoretic.  Cardiovascular:     Rate and Rhythm: Normal rate and regular rhythm.     Heart sounds: Normal heart sounds.  Pulmonary:     Effort: Pulmonary effort is normal. No respiratory distress.     Breath sounds: Normal breath sounds. No wheezing or rales.  Musculoskeletal:     Right lower leg: No edema.     Left lower leg: No edema.     Comments: Hallux valgus deformity bilaterally with medial bunions that are non tender (milldy limited rom of great toes) No erythema or joint swelling  Nl gait in athletic shoes  High arches in feet   LS-no tenderness  Some lumbar muscular tenderness (mild loss of lordosis) Neg SLR No neuro changes    Skin:    General: Skin is warm and dry.     Coloration: Skin is not pale.     Findings: No erythema or rash.  Neurological:     Mental Status: She is alert. Mental status is at baseline.     Sensory: No sensory deficit.     Coordination: Coordination normal.  Psychiatric:        Mood and Affect: Mood normal.           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

## 2019-02-22 NOTE — Assessment & Plan Note (Signed)
Medial bunions with hallux valgus deformity  Not bothersome unless shoes do not fit well  None of the issued steel toed shoes for work are wide enough for her  Note written to excuse her from them (she does not work in the risk area anyway)  Enc her to look on line or in stores for better fitting steel toes

## 2019-03-18 ENCOUNTER — Other Ambulatory Visit (HOSPITAL_COMMUNITY): Payer: Self-pay | Admitting: *Deleted

## 2019-03-18 MED ORDER — RIVAROXABAN 20 MG PO TABS: 20.0000 mg | ORAL_TABLET | Freq: Every day | ORAL | 6 refills | Status: DC

## 2019-03-21 ENCOUNTER — Other Ambulatory Visit: Payer: Self-pay | Admitting: Family Medicine

## 2019-03-21 NOTE — Telephone Encounter (Signed)
Name of [facility] Name of [address] Last Fill or Written Date and Quantity:11/06/18 #15 tabs with 3 refills Last Office Visit and Type:Back Pain 02/22/19  Next Office Visit and Type:CPE on 08/30/19

## 2019-04-12 ENCOUNTER — Other Ambulatory Visit (HOSPITAL_COMMUNITY): Payer: Self-pay | Admitting: *Deleted

## 2019-04-12 MED ORDER — METOPROLOL SUCCINATE ER 25 MG PO TB24: 12.5000 mg | ORAL_TABLET | Freq: Every day | ORAL | 1 refills | Status: DC

## 2019-05-13 ENCOUNTER — Other Ambulatory Visit: Payer: Self-pay

## 2019-05-13 ENCOUNTER — Ambulatory Visit (INDEPENDENT_AMBULATORY_CARE_PROVIDER_SITE_OTHER)
Admission: RE | Admit: 2019-05-13 | Discharge: 2019-05-13 | Disposition: A | Discharge status: Home / Self Care | Payer: 59 | Source: Ambulatory Visit | Attending: Family Medicine | Admitting: Family Medicine

## 2019-05-13 ENCOUNTER — Encounter: Payer: Self-pay | Admitting: Family Medicine

## 2019-05-13 ENCOUNTER — Ambulatory Visit: Payer: 59 | Admitting: Family Medicine

## 2019-05-13 DIAGNOSIS — R2242 Localized swelling, mass and lump, left lower limb: Secondary | ICD-10-CM | POA: Diagnosis not present

## 2019-05-13 DIAGNOSIS — R2240 Localized swelling, mass and lump, unspecified lower limb: Secondary | ICD-10-CM | POA: Insufficient documentation

## 2019-05-13 IMAGING — DX DG TIBIA/FIBULA 2V*L*
4 series · 4 of 4 positions shown · non-contrast
Comparison: None.

CLINICAL DATA: tender spot (about 1.5 cm) over medial lower tibia
left leg

EXAM:
LEFT TIBIA AND FIBULA - 2 VIEW

[tibia ap (1 of 2)]
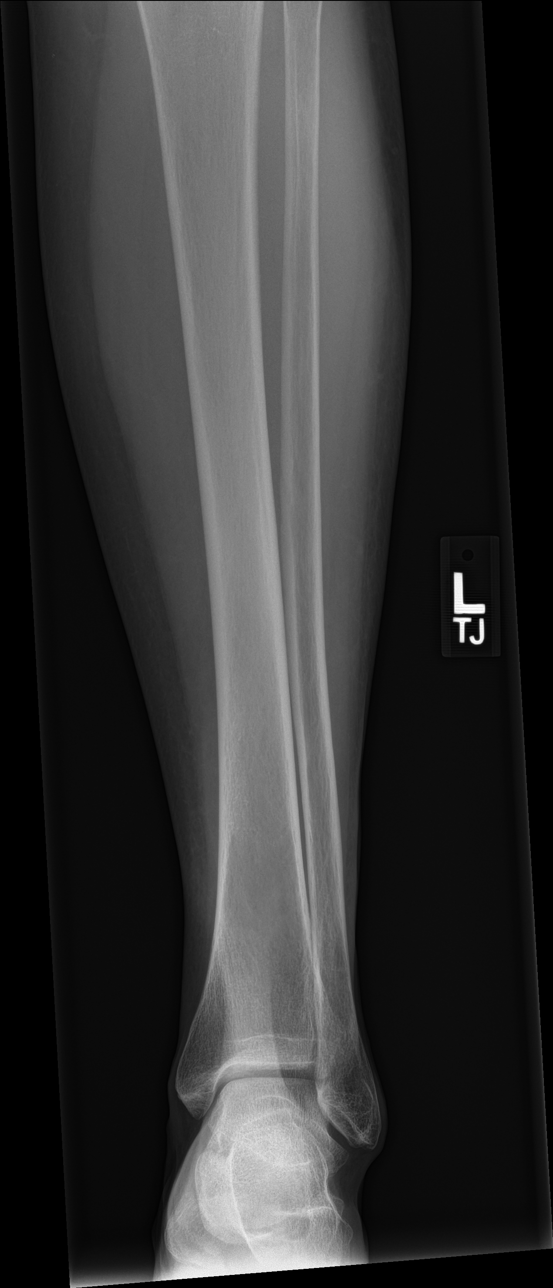

[tibia ap (2 of 2)]
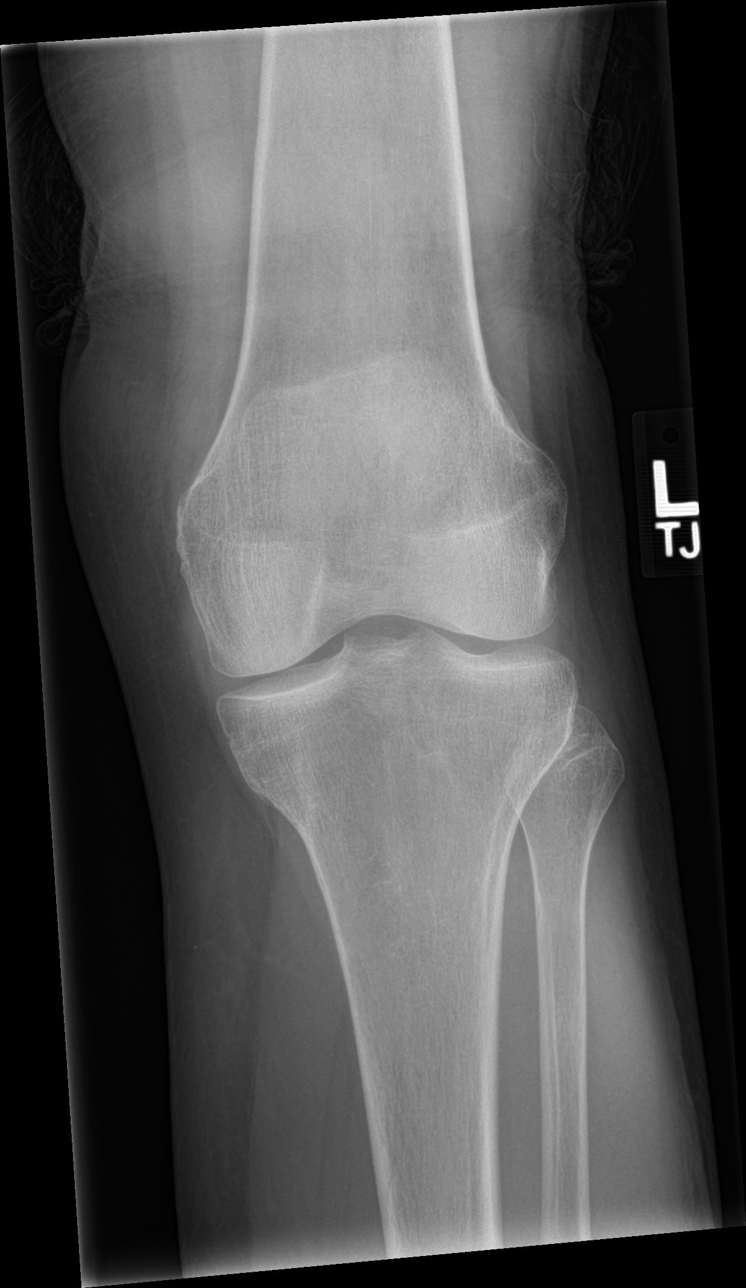

[tibia lat (1 of 2)]
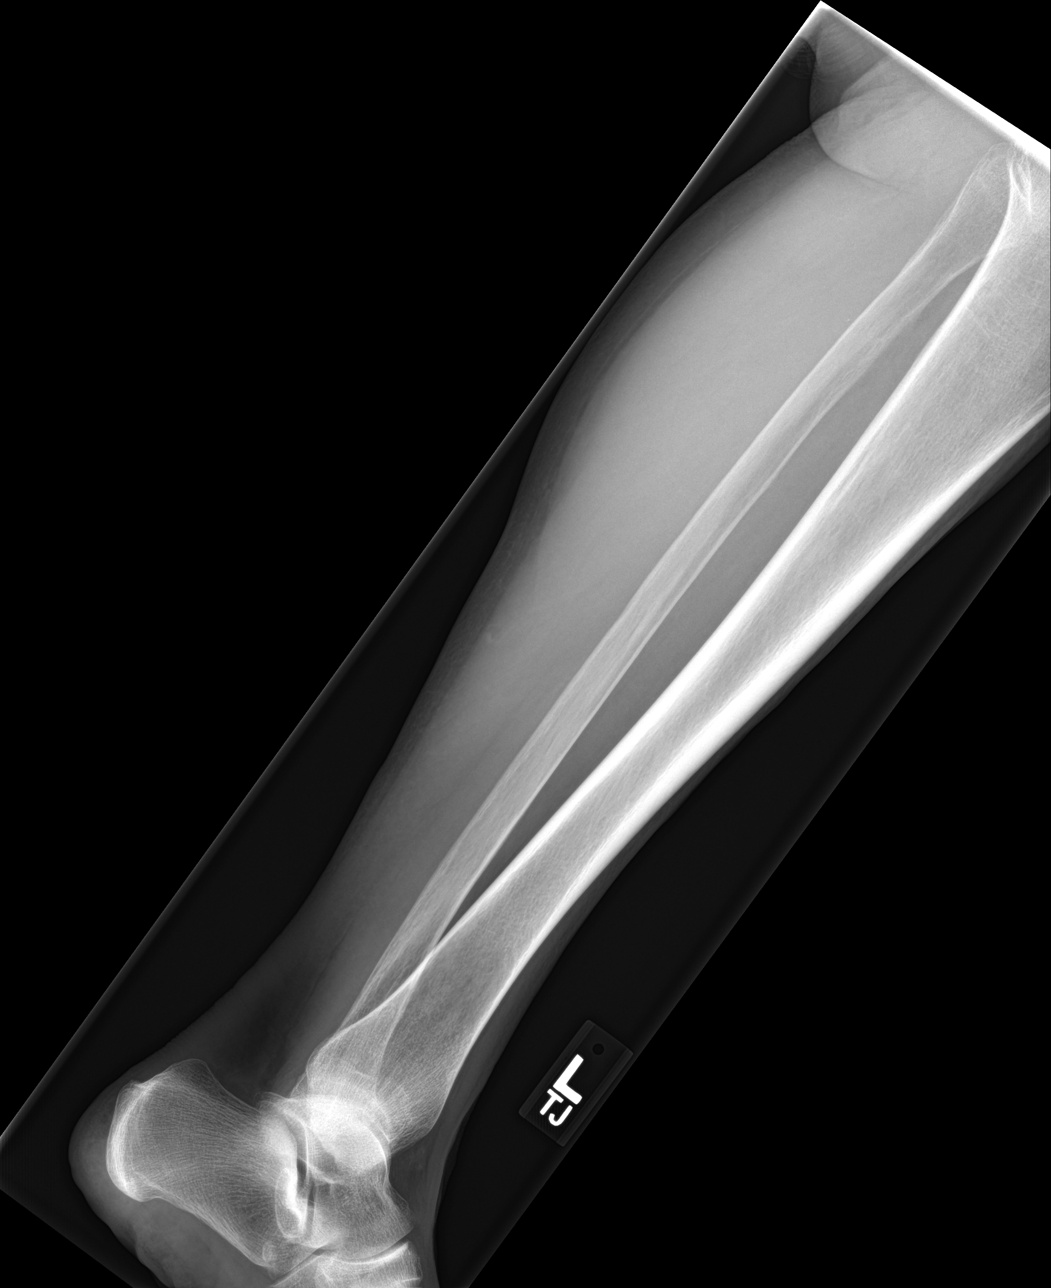

[tibia lat (2 of 2)]
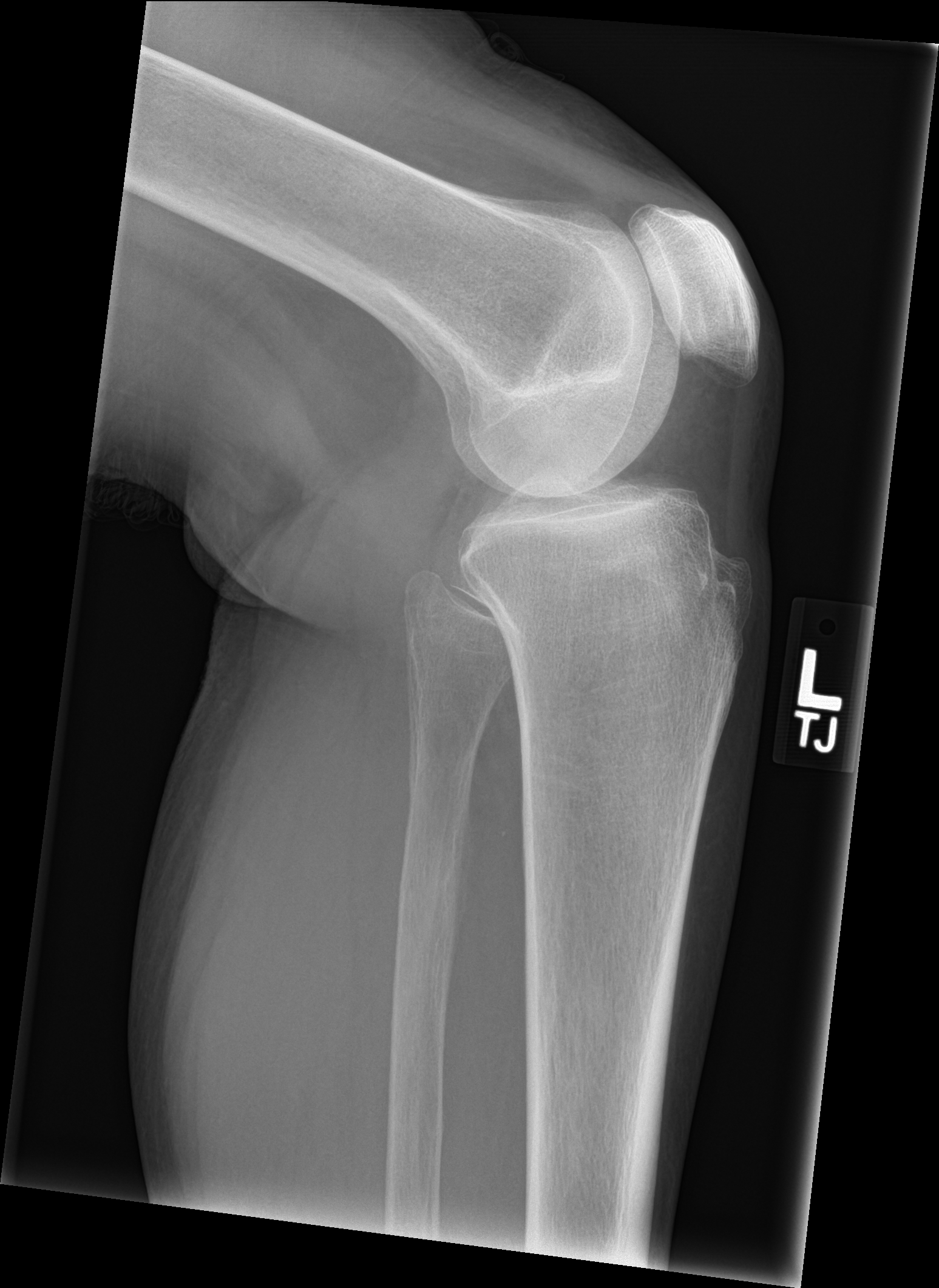

[4 of 4 positions shown; findings below may reference images not displayed]

FINDINGS: There is no evidence of fracture or other focal bone lesions. Soft
tissues are unremarkable.
IMPRESSION: Negative radiographs of the left lower extremity.

## 2019-05-13 NOTE — Patient Instructions (Addendum)
Xray of Left lower leg today  We will contact you with a result   If this starts to bother you more (hurt without touching or get bigger) let us know

## 2019-05-13 NOTE — Progress Notes (Signed)
Subjective:    Patient ID: Denise Grant, female    DOB: 1955-06-24, 64 y.o.   MRN: [id number]  This visit occurred during the SARS-CoV-2 public health emergency.  Safety protocols were in place, including screening questions prior to the visit, additional usage of staff PPE, and extensive cleaning of exam room while observing appropriate contact time as indicated for disinfecting solutions.    HPI Pt presents for L ankle lump with pain   Wt Readings from Last 3 Encounters:  05/13/19 151 lb (68.5 kg)  02/22/19 151 lb 1 oz (68.5 kg)  01/03/19 148 lb 3.2 oz (67.2 kg)   24.37 kg/m   Has a sore spot over her lateral lower leg/just above the ankle   No known trauma  Always a chance   It is tender but not painful when not touch  No new boots  Foot is ok -wears good shoes   Patient Active Problem List   Diagnosis Date Noted  . Localized swelling, mass, or lump of lower extremity 05/13/2019  . Lumbar disc disease   . Bilateral bunions   . Paroxysmal atrial fibrillation ([place]) 09/27/2018  . Fatigue 09/27/2018  . Left shoulder pain 11/15/2017  . Essential hypertension 01/01/2015  . Hypothyroidism 06/25/2014  . Migraine with vertigo 05/21/2014  . History of Chiari malformation 05/21/2014  . Atrophic vaginitis 05/01/2013  . Colon cancer screening 04/20/2012  . Encounter for routine gynecological examination 11/24/2010  . Routine general medical examination at a health care facility 11/18/2010  . INSOMNIA 01/27/2010  . HEADACHE 03/31/2008  . Disorder of bone and cartilage 09/18/2007  . Generalized anxiety disorder 05/05/2006  . MITRAL VALVE PROLAPSE 05/05/2006  . ALLERGIC RHINITIS 05/05/2006  . FIBROCYSTIC BREAST DISEASE 05/05/2006  . SEBORRHEIC DERMATITIS 05/05/2006  . KERATOSIS, ACTINIC 05/05/2006  . NECK PAIN, CHRONIC 05/05/2006   Past Medical History:  Diagnosis Date  . Actinic keratosis   . Allergic rhinitis   . Anxiety   . Chiari malformation   . DDD  (degenerative disc disease)   . Family history of other specified malignant neoplasm   . Headache(784.0)   . Insomnia, unspecified   . Lactose intolerance   . Mitral valve disorders(424.0)   . Osteopenia   . Seborrheic dermatitis, unspecified   . Vaginal cyst    stable   Past Surgical History:  Procedure Laterality Date  . AIR/FLUID EXCHANGE Right 07/25/2018   Procedure: Air/Fluid Exchange;  Surgeon: Jalene Mullet, MD;  Location: [facility];  Service: Ophthalmology;  Laterality: Right;  . bone spur removal  10/06   shoulder  . CATARACT EXTRACTION, BILATERAL Bilateral 2016  . [place] SURGERY  2005  . Chiari Malformation surgery  6/03  . EXCISION MORTON'S NEUROMA  2/04  . GAS INSERTION Right 07/25/2018   Procedure: Insertion Of Gas SF6;  Surgeon: Jalene Mullet, MD;  Location: [facility];  Service: Ophthalmology;  Laterality: Right;  . LUMBAR DISC SURGERY    . 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 TUBAL LIGATION     Social History   Tobacco Use  . Smoking status: Former Smoker    Quit date: 01/17/2001    Years since quitting: 18.3  . Smokeless tobacco: Never Used  Substance Use Topics  . Alcohol use: No    Alcohol/week:  0.0 standard drinks  . Drug use: No   Family History  Problem Relation Age of Onset  . Melanoma Sister   . Lung cancer Father   . Coronary artery disease Mother   . Heart failure Mother   . Hypertension Mother   . Anxiety disorder Son   . Lung cancer Brother        + smoker  . Breast cancer Neg Hx    Allergies  Allergen Reactions  . Calcium     REACTION: cannot tolerate any calcium supplement - GI sympotms  . Fluticasone Propionate     REACTION: headache/irritation  . Hctz [Hydrochlorothiazide] Other (See Comments)     Eye redness and discomfort/ slt blurred vision   . Paroxetine     REACTION: abdominal pain and constipation   Current Outpatient Medications on File Prior to Visit  Medication Sig Dispense Refill  . amLODipine (NORVASC) 5 MG tablet Take 1 tablet (5 mg total) by mouth daily. 90 tablet 3  . famotidine (PEPCID) 40 MG tablet Take 1 tablet (40 mg total) by mouth at bedtime. 30 tablet 11  . levothyroxine (SYNTHROID) 50 MCG tablet TAKE 1 TABLET BY MOUTH DAILY BEFORE BREAKFAST 90 tablet 3  . LORazepam (ATIVAN) 1 MG tablet TAKE 1/2 (ONE-HALF) TABLET BY MOUTH AT BEDTIME AS NEEDED 15 tablet 3  . meclizine (ANTIVERT) 25 MG tablet Take 1 tablet (25 mg total) by mouth 3 (three) times daily as needed for dizziness. 15 tablet 0  . metoprolol succinate (TOPROL-XL) 25 MG 24 hr tablet Take 0.5 tablets (12.5 mg total) by mouth daily. 45 tablet 1  . rivaroxaban (XARELTO) 20 MG TABS tablet Take 1 tablet (20 mg total) by mouth daily with supper. 30 tablet 6   No current facility-administered medications on file prior to visit.    Review of Systems  Constitutional: Negative for activity change, appetite change, fatigue, fever and unexpected weight change.  HENT: Negative for congestion, ear pain, rhinorrhea, sinus pressure and sore throat.   Eyes: Negative for pain, redness and visual disturbance.  Respiratory: Negative for cough, shortness of breath and wheezing.   Cardiovascular: Negative for chest pain and palpitations.  Gastrointestinal: Negative for abdominal pain, blood in stool, constipation and diarrhea.  Endocrine: Negative for polydipsia and polyuria.  Genitourinary: Negative for dysuria, frequency and urgency.  Musculoskeletal: Negative for arthralgias, back pain and myalgias.       Lump on L leg-tender but no pain if she does not touch it   Skin: Negative for pallor and rash.  Allergic/Immunologic: Negative for environmental allergies.  Neurological: Negative for dizziness, syncope and headaches.   Hematological: Negative for adenopathy. Does not bruise/bleed easily.  Psychiatric/Behavioral: Negative for decreased concentration and dysphoric mood. The patient is not nervous/anxious.        Objective:   Physical Exam Constitutional:      General: She is not in acute distress.    Appearance: Normal appearance. She is normal weight. She is not ill-appearing.  Cardiovascular:     Pulses: Normal pulses.     Comments: Good pedal pulses  Musculoskeletal:        General: No deformity.     Right lower leg: No edema.     Left lower leg: No edema.     Comments: 1 to 1.5 cm oval area of swelling over medial lower extremity  (tibia area)  Mobile and slt tender No skin changes   Neurological:     Mental Status: She is alert.  Sensory: No sensory deficit.     Motor: No weakness.     Coordination: Coordination normal.     Gait: Gait normal.  Psychiatric:        Mood and Affect: Mood normal.           Assessment & Plan:   Problem List Items Addressed This Visit      Other   Localized swelling, mass, or lump of lower extremity    Small (1-1.5 cm) area of soft tissue swelling over medial L lower leg (above ankle) Mildly tender but no pain otherwise Cannot r/o trauma or bruise in the past  Suspect residual hematoma or scar tissue  Xray of tib/fib today to r/o bony problem  Result to follow  Inst to call if worse , also to protect this area from trauma       Relevant Orders   DG Tibia/Fibula Left

## 2019-05-13 NOTE — Assessment & Plan Note (Signed)
Small (1-1.5 cm) area of soft tissue swelling over medial L lower leg (above ankle) Mildly tender but no pain otherwise Cannot r/o trauma or bruise in the past  Suspect residual hematoma or scar tissue  Xray of tib/fib today to r/o bony problem  Result to follow  Inst to call if worse , also to protect this area from trauma

## 2019-06-24 ENCOUNTER — Other Ambulatory Visit: Payer: Self-pay | Admitting: Family Medicine

## 2019-06-24 DIAGNOSIS — Z1231 Encounter for screening mammogram for malignant neoplasm of breast: Secondary | ICD-10-CM

## 2019-07-04 NOTE — Progress Notes (Signed)
Primary Care Physician: Tower, Wynelle Fanny, MD Primary Cardiologist: none Primary Electrophysiologist: none Referring Physician: [facility]   Denise Grant is a 64 y.o. female with a history of Chiari malformation, HTN, hypothyroidism, MVP, generalized anxiety, and paroxysmal atrial fibrillation who presents for follow up in the [facility].  The patient was initially diagnosed with atrial fibrillation on 09/19/18 after presenting to the ER with symptoms of SOB, chest discomfort, and weakness. She was found to be in afib with RVR and underwent successful DCCV. There were no specific triggers that the patient could identify. She denies snoring or significant alcohol use. She had an echo 09/26/18 which showed preserved EF and a Zio patch 10/01/18 which showed no afib.   On follow up today, patient reports that she has done well since her last visit. She has had very brief and infrequent palpitations. She denies any bleeding issues on anticoagulation.   Today, she denies symptoms of palpitations, chest pain, shortness of breath, orthopnea, PND, lower extremity edema, dizziness, presyncope, syncope, snoring, daytime somnolence, bleeding, or neurologic sequela. The patient is tolerating medications without difficulties and is otherwise without complaint today.    Atrial Fibrillation Risk Factors:  she does not have symptoms or diagnosis of sleep apnea. she does not have a history of rheumatic fever. she does not have a history of alcohol use. The patient does have a history of early familial atrial fibrillation or other arrhythmias. Sister has afib, brother has PPM.  she has a BMI of Body mass index is 24.99 kg/m.Marland Kitchen Filed Weights   07/05/19 0839  Weight: 70.2 kg    Family History  Problem Relation Age of Onset  . Melanoma Sister   . Lung cancer Father   . Coronary artery disease Mother   . Heart failure Mother   . Hypertension Mother   . Anxiety disorder Son     . Lung cancer Brother        + smoker  . Breast cancer Neg Hx      Atrial Fibrillation Management history:  Previous antiarrhythmic drugs: none Previous cardioversions: 09/19/18 Previous ablations: none CHADS2VASC score: 3 Anticoagulation history: Xarelto   Past Medical History:  Diagnosis Date  . Actinic keratosis   . Allergic rhinitis   . Anxiety   . Chiari malformation   . DDD (degenerative disc disease)   . Family history of other specified malignant neoplasm   . Headache(784.0)   . Insomnia, unspecified   . Lactose intolerance   . Mitral valve disorders(424.0)   . Osteopenia   . Seborrheic dermatitis, unspecified   . Vaginal cyst    stable   Past Surgical History:  Procedure Laterality Date  . AIR/FLUID EXCHANGE Right 07/25/2018   Procedure: Air/Fluid Exchange;  Surgeon: Jalene Mullet, MD;  Location: [facility];  Service: Ophthalmology;  Laterality: Right;  . bone spur removal  10/06   shoulder  . CATARACT EXTRACTION, BILATERAL Bilateral 2016  . [place] SURGERY  2005  . Chiari Malformation surgery  6/03  . EXCISION MORTON'S NEUROMA  2/04  . GAS INSERTION Right 07/25/2018   Procedure: Insertion Of Gas SF6;  Surgeon: Jalene Mullet, MD;  Location: [facility];  Service: Ophthalmology;  Laterality: Right;  . LUMBAR DISC SURGERY    . 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 TUBAL LIGATION      Current Outpatient Medications  Medication Sig Dispense Refill  . amLODipine (NORVASC) 5 MG tablet Take 1 tablet (5 mg total) by mouth daily. 90 tablet 3  . famotidine (PEPCID) 40 MG tablet Take 1 tablet (40 mg total) by mouth at bedtime. 30 tablet 11  . levothyroxine (SYNTHROID) 50 MCG tablet TAKE 1 TABLET BY MOUTH DAILY BEFORE BREAKFAST 90  tablet 3  . loratadine (CLARITIN) 10 MG tablet Take 10 mg by mouth daily as needed for allergies.    Marland Kitchen LORazepam (ATIVAN) 1 MG tablet TAKE 1/2 (ONE-HALF) TABLET BY MOUTH AT BEDTIME AS NEEDED 15 tablet 3  . meclizine (ANTIVERT) 25 MG tablet Take 1 tablet (25 mg total) by mouth 3 (three) times daily as needed for dizziness. 15 tablet 0  . metoprolol succinate (TOPROL-XL) 25 MG 24 hr tablet Take 0.5 tablets (12.5 mg total) by mouth daily. 45 tablet 1  . rivaroxaban (XARELTO) 20 MG TABS tablet Take 1 tablet (20 mg total) by mouth daily with supper. 30 tablet 6   No current facility-administered medications for this encounter.    Allergies  Allergen Reactions  . Calcium     REACTION: cannot tolerate any calcium supplement - GI sympotms  . Fluticasone Propionate     REACTION: headache/irritation  . Hctz [Hydrochlorothiazide] Other (See Comments)    Eye redness and discomfort/ slt blurred vision   . Paroxetine     REACTION: abdominal pain and constipation    Social History   Socioeconomic History  . Marital status: Married    Spouse name: Not on file  . Number of children: Not on file  . Years of education: Not on file  . Highest education level: Not on file  Occupational History  . Not on file  Tobacco Use  . Smoking status: Former Smoker    Quit date: 01/17/2001    Years since quitting: 18.4  . Smokeless tobacco: Never Used  Substance and Sexual Activity  . Alcohol use: No    Alcohol/week: 0.0 standard drinks  . Drug use: No  . Sexual activity: Not on file  Other Topics Concern  . Not on file  Social History Narrative   Walks daily for exercise      Married--husband diagnosed with stomach cancer (5/11)   Lost her husband 4/12 to cancer    Social Determinants of Health   Financial Resource Strain:   . Difficulty of Paying Living Expenses:   Food Insecurity:   . Worried About Charity fundraiser in the Last Year:   . Arboriculturist in the Last Year:   Transportation  Needs:   . Film/video editor (Medical):   Marland Kitchen Lack of Transportation (Non-Medical):   Physical Activity:   . Days of Exercise per Week:   . Minutes of Exercise per Session:   Stress:   . Feeling of Stress :   Social Connections:   . Frequency of Communication with Friends and Family:   . Frequency of Social Gatherings with Friends and Family:   . Attends Religious Services:   . Active Member of Clubs or Organizations:   . Attends Archivist Meetings:   Marland Kitchen Marital Status:   Intimate Partner Violence:   . Fear of Current or Ex-Partner:   . Emotionally Abused:   Marland Kitchen Physically Abused:   . Sexually Abused:      ROS- All systems are reviewed and negative  except as per the HPI above.  Physical Exam: Vitals:   07/05/19 0839  BP: (!) 142/70  Pulse: (!) 54  Weight: 70.2 kg  Height: 5\' 6"  (1.676 m)    GEN- The patient is well appearing, alert and oriented x 3 today.   HEENT-head normocephalic, atraumatic, sclera clear, conjunctiva pink, hearing intact, trachea midline. Lungs- Clear to ausculation bilaterally, normal work of breathing Heart- Regular rate and rhythm, no murmurs, rubs or gallops  GI- soft, NT, ND, + BS Extremities- no clubbing, cyanosis, or edema MS- no significant deformity or atrophy Skin- no rash or lesion Psych- euthymic mood, full affect Neuro- strength and sensation are intact   Wt Readings from Last 3 Encounters:  07/05/19 70.2 kg  05/13/19 68.5 kg  02/22/19 68.5 kg    EKG today demonstrates SB HR 54, LAD, PR 146, QRS 78, QTc 386  Echo 09/26/18 1. The left ventricle has normal systolic function with an ejection fraction of 60-65%. The cavity size was normal. Left ventricular diastolic parameters were normal. No evidence of left ventricular regional wall motion abnormalities.  2. The right ventricle has normal systolic function. The cavity was normal. There is no increase in right ventricular wall thickness. Right ventricular systolic pressure  is normal with an estimated pressure of 21.8 mmHg.  3. The pericardial effusion is circumferential.  4. Trivial pericardial effusion is present.  5. Mild thickening of the mitral valve leaflet. There is mild mitral annular calcification present.  6. Mild thickening of the aortic valve. Aortic valve regurgitation is mild by color flow Doppler.  7. The aorta is normal unless otherwise noted.  Epic records are reviewed at length today  Assessment and Plan:  1. Paroxysmal atrial fibrillation Patient appears to be maintaining SR. Continue Toprol to 12.5 mg daily Continue Xarelto 20 mg daily.  This patients CHA2DS2-VASc Score and unadjusted Ischemic Stroke Rate (% per year) is equal to 3.2 % stroke rate/year from a score of 3  Above score calculated as 1 point each if present [CHF, HTN, DM, Vascular=MI/PAD/Aortic Plaque, Age if 65-74, or Female] Above score calculated as 2 points each if present [Age > 75, or Stroke/TIA/TE]  2. MVP Per patient history, no MVP seen on echo.  3. HTN Stable, no changes today.   Follow up in the AF clinic in 6 months.    [facility] [address] [place], [place] [address] [phone] 07/05/2019 8:59 AM

## 2019-07-05 ENCOUNTER — Encounter (HOSPITAL_COMMUNITY): Payer: Self-pay | Admitting: Physician Assistant

## 2019-07-05 ENCOUNTER — Ambulatory Visit (HOSPITAL_COMMUNITY)
Admission: RE | Admit: 2019-07-05 | Discharge: 2019-07-05 | Disposition: A | Discharge status: Home / Self Care | Payer: 59 | Source: Ambulatory Visit | Attending: Physician Assistant | Admitting: Physician Assistant

## 2019-07-05 ENCOUNTER — Other Ambulatory Visit: Payer: Self-pay

## 2019-07-05 VITALS — BP 142/70 | HR 54 | Ht 66.0 in | Wt 154.8 lb

## 2019-07-05 DIAGNOSIS — Z79899 Other long term (current) drug therapy: Secondary | ICD-10-CM | POA: Insufficient documentation

## 2019-07-05 DIAGNOSIS — D6869 Other thrombophilia: Secondary | ICD-10-CM

## 2019-07-05 DIAGNOSIS — E039 Hypothyroidism, unspecified: Secondary | ICD-10-CM | POA: Diagnosis not present

## 2019-07-05 DIAGNOSIS — Z7901 Long term (current) use of anticoagulants: Secondary | ICD-10-CM | POA: Diagnosis not present

## 2019-07-05 DIAGNOSIS — I10 Essential (primary) hypertension: Secondary | ICD-10-CM | POA: Diagnosis not present

## 2019-07-05 DIAGNOSIS — Z87891 Personal history of nicotine dependence: Secondary | ICD-10-CM | POA: Diagnosis not present

## 2019-07-05 DIAGNOSIS — I480 Paroxysmal atrial fibrillation: Secondary | ICD-10-CM | POA: Diagnosis not present

## 2019-08-03 ENCOUNTER — Other Ambulatory Visit: Payer: Self-pay | Admitting: Family Medicine

## 2019-08-07 ENCOUNTER — Other Ambulatory Visit: Payer: Self-pay | Admitting: Family Medicine

## 2019-08-07 NOTE — Telephone Encounter (Signed)
Name of Ketchum Name of [address] Last Fill or Written Date and Quantity: 03/21/19#15 tabs with 3 refills Last Office Visit and Type:leg/knee issues 05/13/19  Next Office Visit and Type:CPE on 08/30/19

## 2019-08-08 ENCOUNTER — Ambulatory Visit
Admission: RE | Admit: 2019-08-08 | Discharge: 2019-08-08 | Disposition: A | Discharge status: Home / Self Care | Payer: 59 | Source: Ambulatory Visit | Attending: Family Medicine | Admitting: Family Medicine

## 2019-08-08 DIAGNOSIS — Z1231 Encounter for screening mammogram for malignant neoplasm of breast: Secondary | ICD-10-CM | POA: Diagnosis not present

## 2019-08-08 IMAGING — MG DIGITAL SCREENING BILAT W/ TOMO W/ CAD
8 series · 8 of 24 positions shown · non-contrast
Comparison: Previous exam(s).

CLINICAL DATA: Screening.

EXAM:
DIGITAL SCREENING BILATERAL MAMMOGRAM WITH TOMO AND CAD

[L MLO synth-2D]
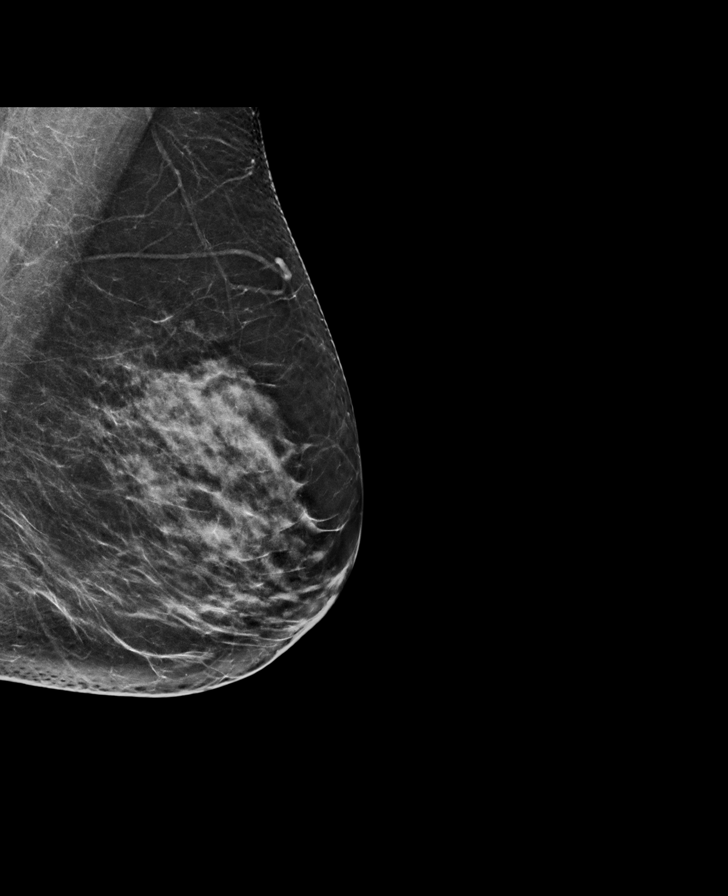

[R MLO synth-2D]
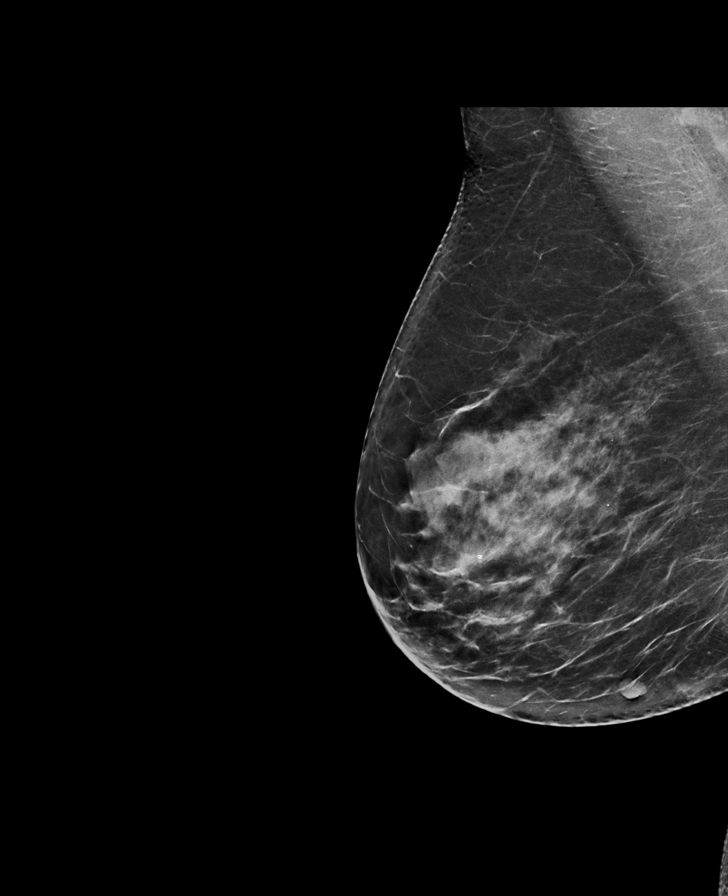

[L CC synth-2D]
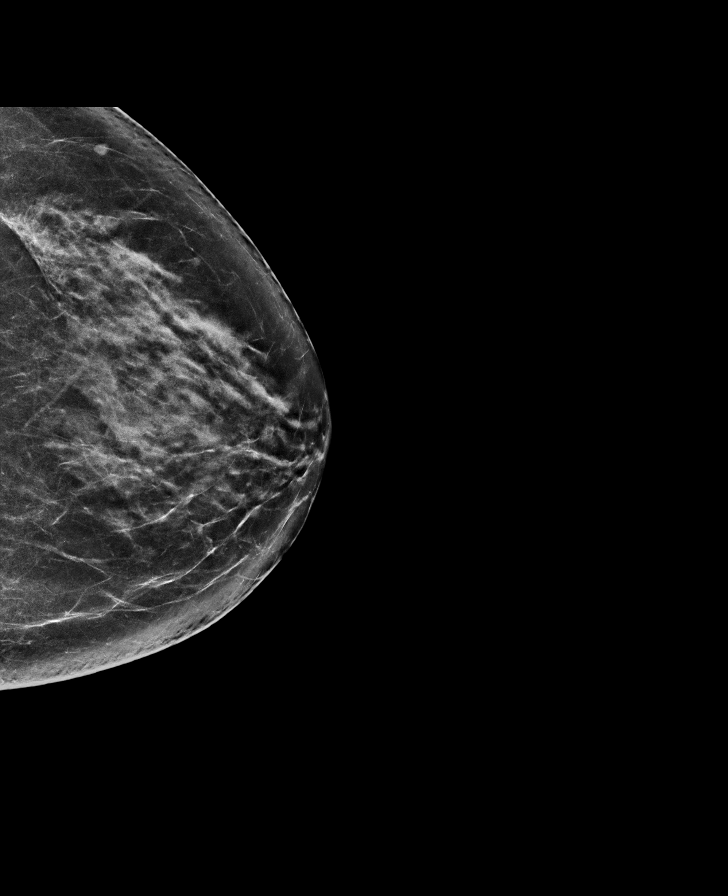

[R CC synth-2D]
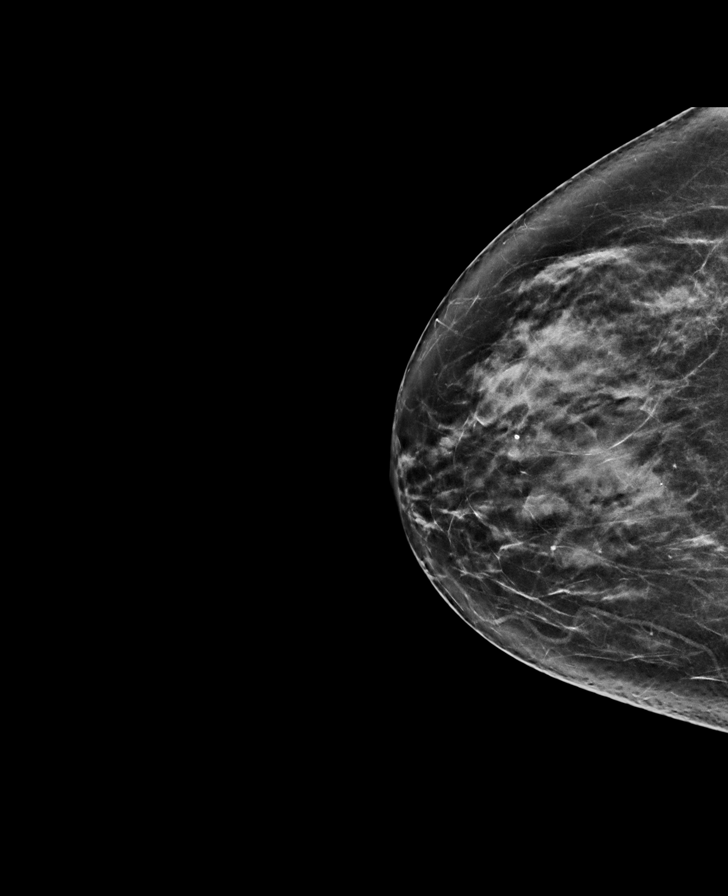

[R MLO tomo · tomo slice 39/76.0]
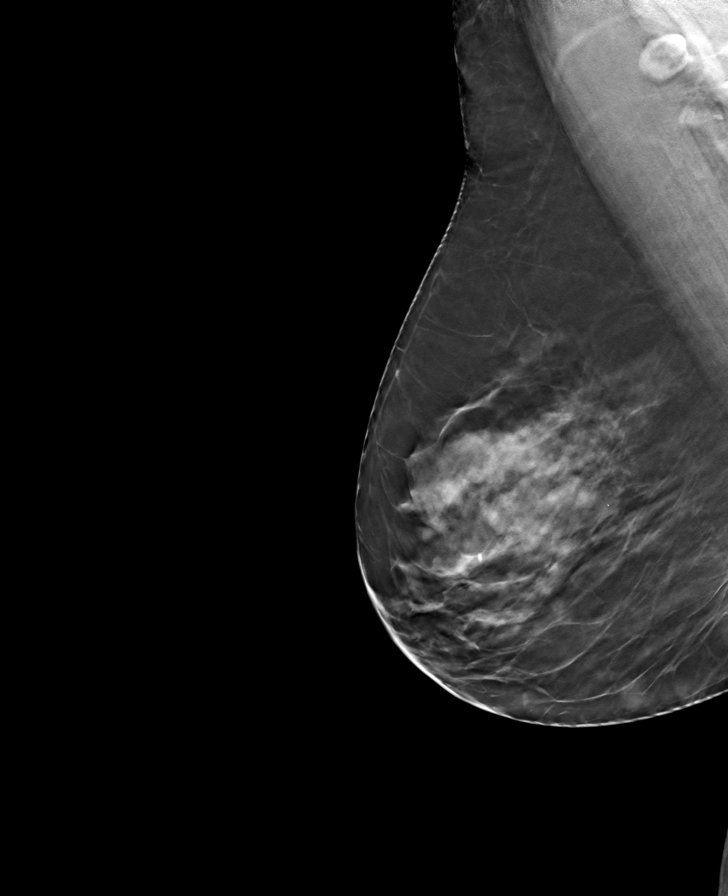

[R CC tomo · tomo slice 35/70.0]
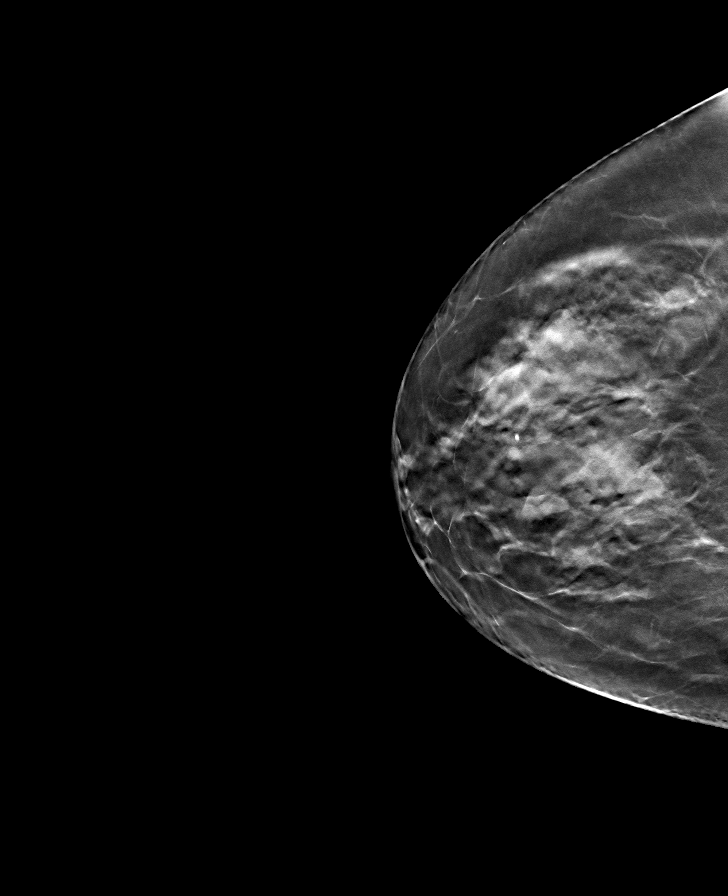

[L CC tomo · tomo slice 37/72.0]
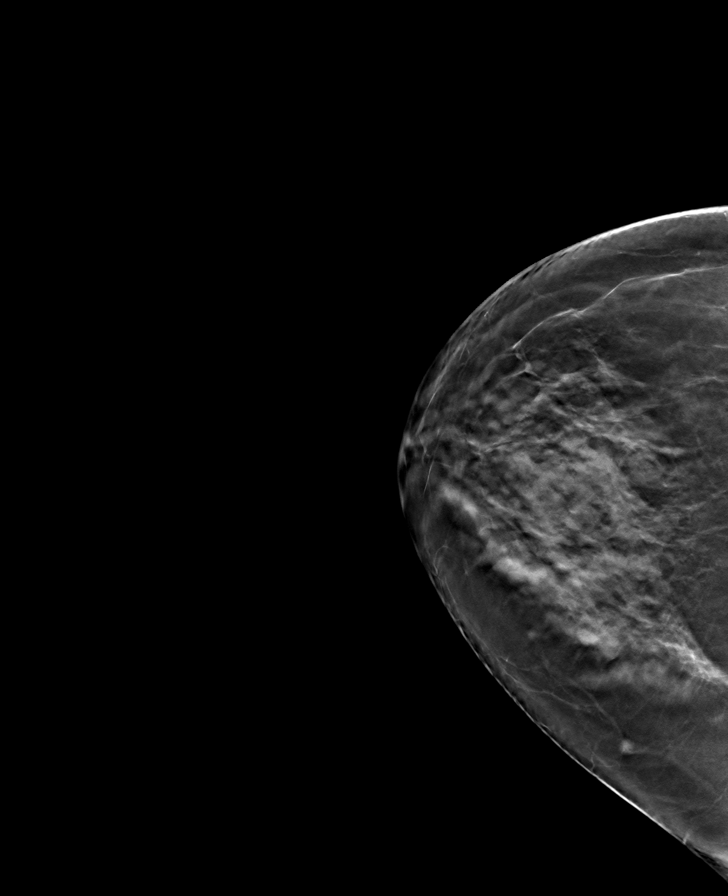

[L MLO tomo · tomo slice 38/75.0]
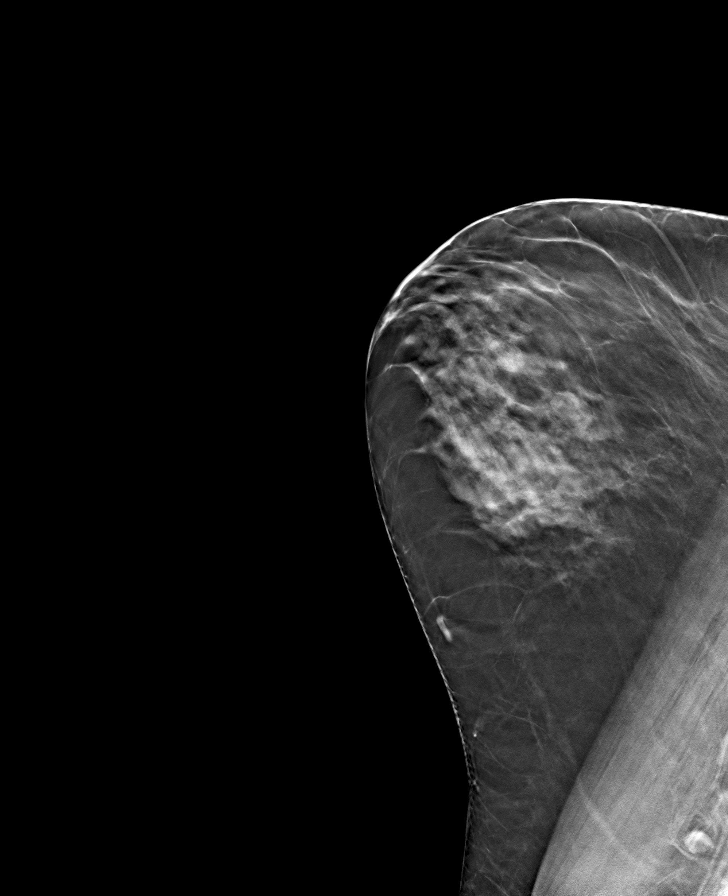

[8 of 24 positions shown; findings below may reference images not displayed]

ACR Breast Density Category c: The breast tissue is heterogeneously
dense, which may obscure small masses.
FINDINGS: There are no findings suspicious for malignancy. Images were
processed with CAD.
IMPRESSION: No mammographic evidence of malignancy. A result letter of this
screening mammogram will be mailed directly to the patient.

RECOMMENDATION:
Screening mammogram in one year. (Code:[5V])

BI-RADS CATEGORY  1: Negative.

## 2019-08-23 ENCOUNTER — Telehealth: Payer: Self-pay | Admitting: Family Medicine

## 2019-08-23 ENCOUNTER — Other Ambulatory Visit (INDEPENDENT_AMBULATORY_CARE_PROVIDER_SITE_OTHER): Payer: 59

## 2019-08-23 ENCOUNTER — Other Ambulatory Visit: Payer: Self-pay

## 2019-08-23 DIAGNOSIS — E039 Hypothyroidism, unspecified: Secondary | ICD-10-CM

## 2019-08-23 DIAGNOSIS — I10 Essential (primary) hypertension: Secondary | ICD-10-CM | POA: Diagnosis not present

## 2019-08-23 LAB — COMPREHENSIVE METABOLIC PANEL
ALT: 21 U/L (ref 0–35)
AST: 26 U/L (ref 0–37)
Albumin: 4.3 g/dL (ref 3.5–5.2)
Alkaline Phosphatase: 55 U/L (ref 39–117)
BUN: 14 mg/dL (ref 6–23)
CO2: 30 mEq/L (ref 19–32)
Calcium: 9.4 mg/dL (ref 8.4–10.5)
Chloride: 104 mEq/L (ref 96–112)
Creatinine, Ser: 0.79 mg/dL (ref 0.40–1.20)
GFR: 72.94 mL/min (ref >60.00)
Glucose, Bld: 91 mg/dL (ref 70–99)
Potassium: 4.4 mEq/L (ref 3.5–5.1)
Sodium: 138 mEq/L (ref 135–145)
Total Bilirubin: 0.8 mg/dL (ref 0.2–1.2)
Total Protein: 6.7 g/dL (ref 6.0–8.3)

## 2019-08-23 LAB — CBC WITH DIFFERENTIAL/PLATELET
Basophils Absolute: 0 10*3/uL (ref 0.0–0.1)
Basophils Relative: 0.5 % (ref 0.0–3.0)
Eosinophils Absolute: 0 10*3/uL (ref 0.0–0.7)
Eosinophils Relative: 0.9 % (ref 0.0–5.0)
HCT: 38.7 % (ref 36.0–46.0)
Hemoglobin: 13.1 g/dL (ref 12.0–15.0)
Lymphocytes Relative: 25.1 % (ref 12.0–46.0)
Lymphs Abs: 1.1 10*3/uL (ref 0.7–4.0)
MCHC: 34 g/dL (ref 30.0–36.0)
MCV: 91.3 fl (ref 78.0–100.0)
Monocytes Absolute: 0.4 10*3/uL (ref 0.1–1.0)
Monocytes Relative: 9.1 % (ref 3.0–12.0)
Neutro Abs: 2.8 10*3/uL (ref 1.4–7.7)
Neutrophils Relative %: 64.4 % (ref 43.0–77.0)
Platelets: 193 10*3/uL (ref 150.0–400.0)
RBC: 4.24 Mil/uL (ref 3.87–5.11)
RDW: 13.4 % (ref 11.5–15.5)
WBC: 4.4 10*3/uL (ref 4.0–10.5)

## 2019-08-23 LAB — LIPID PANEL
Cholesterol: 158 mg/dL (ref 0–200)
HDL: 55 mg/dL (ref >39.00)
LDL Cholesterol: 91 mg/dL (ref 0–99)
NonHDL: 103.43
Total CHOL/HDL Ratio: 3
Triglycerides: 62 mg/dL (ref 0.0–149.0)
VLDL: 12.4 mg/dL (ref 0.0–40.0)

## 2019-08-23 LAB — TSH: TSH: 1.23 u[IU]/mL (ref 0.35–4.50)

## 2019-08-23 NOTE — Telephone Encounter (Signed)
-----   Message from Ellamae Sia sent at 08/07/2019  9:39 AM EDT ----- Regarding: Lab orders for Friday, 8.6.21 Patient is scheduled for CPX labs, please order future labs, Thanks , Karna Christmas

## 2019-08-30 ENCOUNTER — Ambulatory Visit (INDEPENDENT_AMBULATORY_CARE_PROVIDER_SITE_OTHER): Payer: 59 | Admitting: Family Medicine

## 2019-08-30 ENCOUNTER — Encounter: Payer: Self-pay | Admitting: Family Medicine

## 2019-08-30 ENCOUNTER — Other Ambulatory Visit: Payer: Self-pay

## 2019-08-30 VITALS — BP 120/68 | HR 53 | Temp 96.1°F | Ht 66.0 in | Wt 155.0 lb

## 2019-08-30 DIAGNOSIS — Z1211 Encounter for screening for malignant neoplasm of colon: Secondary | ICD-10-CM

## 2019-08-30 DIAGNOSIS — Z0000 Encounter for general adult medical examination without abnormal findings: Secondary | ICD-10-CM | POA: Diagnosis not present

## 2019-08-30 DIAGNOSIS — I480 Paroxysmal atrial fibrillation: Secondary | ICD-10-CM

## 2019-08-30 DIAGNOSIS — E039 Hypothyroidism, unspecified: Secondary | ICD-10-CM | POA: Diagnosis not present

## 2019-08-30 DIAGNOSIS — F411 Generalized anxiety disorder: Secondary | ICD-10-CM

## 2019-08-30 DIAGNOSIS — I10 Essential (primary) hypertension: Secondary | ICD-10-CM

## 2019-08-30 DIAGNOSIS — M899 Disorder of bone, unspecified: Secondary | ICD-10-CM

## 2019-08-30 DIAGNOSIS — M949 Disorder of cartilage, unspecified: Secondary | ICD-10-CM

## 2019-08-30 MED ORDER — FAMOTIDINE 40 MG PO TABS: 40.0000 mg | ORAL_TABLET | Freq: Every day | ORAL | 3 refills | Status: DC

## 2019-08-30 MED ORDER — LEVOTHYROXINE SODIUM 50 MCG PO TABS: ORAL_TABLET | ORAL | 3 refills | Status: DC

## 2019-08-30 MED ORDER — AMLODIPINE BESYLATE 5 MG PO TABS: 5.0000 mg | ORAL_TABLET | Freq: Every day | ORAL | 3 refills | Status: DC

## 2019-08-30 NOTE — Assessment & Plan Note (Signed)
Ref for cologaurd test Declines colonoscopy

## 2019-08-30 NOTE — Assessment & Plan Note (Signed)
Reviewed health habits including diet and exercise and skin cancer prevention Reviewed appropriate screening tests for age  Also reviewed health mt list, fam hx and immunization status , as well as social and family history   See HPI Labs reviewed  Stable weight/ good habits Declines prevnar vaccine  Signed up for cologuard for colon cancer screen (she declines colonoscopy) dexa 5/14 with osteopenia-she declines another for now  No falls or fx and she continues her vit D

## 2019-08-30 NOTE — Assessment & Plan Note (Signed)
Pt declines another dexa  No falls or fractures Taking vit d Walking for exercise

## 2019-08-30 NOTE — Patient Instructions (Addendum)
If you change your mind about a pneumonia vaccine in the future please let us know   If you are interested in the new shingles vaccine (Shingrix) - call your local pharmacy to check on coverage and availability  If affordable, get on a wait list at your pharmacy to get the vaccine.  We will sign you up for the cologuard screening    Stay active/ take care of yourself

## 2019-08-30 NOTE — Assessment & Plan Note (Signed)
No clinical changes  Doing well Planning retirement

## 2019-08-30 NOTE — Assessment & Plan Note (Signed)
bp in fair control at this time  BP Readings from Last 1 Encounters:  08/30/19 120/68   No changes needed Most recent labs reviewed  Disc lifstyle change with low sodium diet and exercise

## 2019-08-30 NOTE — Assessment & Plan Note (Signed)
Rate controlled occ short episode Continues xarelto

## 2019-08-30 NOTE — Assessment & Plan Note (Signed)
Hypothyroidism  Pt has no clinical changes No change in energy level/ hair or skin/ edema and no tremor Lab Results  Component Value Date   TSH 1.23 08/23/2019    Continues levothyroxine 50 mcg

## 2019-08-30 NOTE — Progress Notes (Signed)
Subjective:    Patient ID: Denise Grant, female    DOB: May 25, 1955, 64 y.o.   MRN: [id number]  This visit occurred during the SARS-CoV-2 public health emergency.  Safety protocols were in place, including screening questions prior to the visit, additional usage of staff PPE, and extensive cleaning of exam room while observing appropriate contact time as indicated for disinfecting solutions.    HPI  Here for health maintenance exam and to review chronic medical problems    Wt Readings from Last 3 Encounters:  08/30/19 155 lb (70.3 kg)  07/05/19 154 lb 12.8 oz (70.2 kg)  05/13/19 151 lb (68.5 kg)   25.02 kg/m   Getting ready to retire oct 1  She plans to look for some part time work  Wants to stay busy  Wants to spend more time with older family   Eating healthy  A little less exercise in the heat - but likes to get out early   She is covid immunized  Zoster status--not interested in the vaccine  Gets flu shot in the fall  Tdap 4/14  Due for prevnar -wants to put off   Colon screen cologuard 7/18 -negative  Wants to do that again   Pap 7/18 -neg with neg hpv  Mammogram 7/21 Self breast exam - no lumps or concerns   dexa 5/14-osteopenia , declines another  Falls - none  Fractures-none  Supplements - taking vitamin  Exercise -walking   HTN bp is stable today  No cp or palpitations or headaches or edema  No side effects to medicines  BP Readings from Last 3 Encounters:  08/30/19 120/68  07/05/19 (!) 142/70  05/13/19 120/68     Pulse Readings from Last 3 Encounters:  08/30/19 (!) 53  07/05/19 (!) 54  05/13/19 72   A fib paroxysmal- notices it a little now and then - briefly  Is anticoagulated  Cardiology wants to release back to [place] next time if doing well    Hypothyroidism  Pt has no clinical changes No change in energy level/ hair or skin/ edema and no tremor Lab Results  Component Value Date   TSH 1.23 08/23/2019     Mood/anxiety Is a little  anxious about retirement and when she gets a fib   Cholesterol Lab Results  Component Value Date   CHOL 158 08/23/2019   CHOL 163 08/17/2018   CHOL 156 07/28/2017   Lab Results  Component Value Date   HDL 55.00 08/23/2019   HDL 60.70 08/17/2018   HDL 57.80 07/28/2017   Lab Results  Component Value Date   LDLCALC 91 08/23/2019   LDLCALC 86 08/17/2018   LDLCALC 86 07/28/2017   Lab Results  Component Value Date   TRIG 62.0 08/23/2019   TRIG 83.0 08/17/2018   TRIG 61.0 07/28/2017   Lab Results  Component Value Date   CHOLHDL 3 08/23/2019   CHOLHDL 3 08/17/2018   CHOLHDL 3 07/28/2017   No results found for: LDLDIRECT She eats fairly healthy  Glucose 91   Other labs  Lab Results  Component Value Date   CREATININE 0.79 08/23/2019   BUN 14 08/23/2019   NA 138 08/23/2019   K 4.4 08/23/2019   CL 104 08/23/2019   CO2 30 08/23/2019   Lab Results  Component Value Date   ALT 21 08/23/2019   AST 26 08/23/2019   ALKPHOS 55 08/23/2019   BILITOT 0.8 08/23/2019    Lab Results  Component Value Date  WBC 4.4 08/23/2019   HGB 13.1 08/23/2019   HCT 38.7 08/23/2019   MCV 91.3 08/23/2019   PLT 193.0 08/23/2019   Patient Active Problem List   Diagnosis Date Noted  . Secondary hypercoagulable state ([place]) 07/05/2019  . Localized swelling, mass, or lump of lower extremity 05/13/2019  . Lumbar disc disease   . Bilateral bunions   . Paroxysmal atrial fibrillation ([place]) 09/27/2018  . Fatigue 09/27/2018  . Left shoulder pain 11/15/2017  . Essential hypertension 01/01/2015  . Hypothyroidism 06/25/2014  . Migraine with vertigo 05/21/2014  . History of Chiari malformation 05/21/2014  . Atrophic vaginitis 05/01/2013  . Colon cancer screening 04/20/2012  . Encounter for routine gynecological examination 11/24/2010  . Routine general medical examination at a health care facility 11/18/2010  . INSOMNIA 01/27/2010  . HEADACHE 03/31/2008  . Disorder of bone and cartilage  09/18/2007  . Generalized anxiety disorder 05/05/2006  . MITRAL VALVE PROLAPSE 05/05/2006  . ALLERGIC RHINITIS 05/05/2006  . FIBROCYSTIC BREAST DISEASE 05/05/2006  . SEBORRHEIC DERMATITIS 05/05/2006  . KERATOSIS, ACTINIC 05/05/2006  . NECK PAIN, CHRONIC 05/05/2006   Past Medical History:  Diagnosis Date  . Actinic keratosis   . Allergic rhinitis   . Anxiety   . Chiari malformation   . DDD (degenerative disc disease)   . Family history of other specified malignant neoplasm   . Headache(784.0)   . Insomnia, unspecified   . Lactose intolerance   . Mitral valve disorders(424.0)   . Osteopenia   . Seborrheic dermatitis, unspecified   . Vaginal cyst    stable   Past Surgical History:  Procedure Laterality Date  . AIR/FLUID EXCHANGE Right 07/25/2018   Procedure: Air/Fluid Exchange;  Surgeon: Jalene Mullet, MD;  Location: [facility];  Service: Ophthalmology;  Laterality: Right;  . bone spur removal  10/06   shoulder  . CATARACT EXTRACTION, BILATERAL Bilateral 2016  . [place] SURGERY  2005  . Chiari Malformation surgery  6/03  . EXCISION MORTON'S NEUROMA  2/04  . GAS INSERTION Right 07/25/2018   Procedure: Insertion Of Gas SF6;  Surgeon: Jalene Mullet, MD;  Location: [place];  Service: Ophthalmology;  Laterality: Right;  . LUMBAR DISC SURGERY    . 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 TUBAL LIGATION     Social History   Tobacco Use  . Smoking status: Former Smoker    Quit date: 01/17/2001    Years since quitting: 18.6  . Smokeless tobacco: Never Used  Substance Use Topics  . Alcohol use: No    Alcohol/week: 0.0 standard drinks  . Drug use: No   Family History  Problem Relation Age of Onset  . Melanoma Sister   . Lung cancer Father   .  Coronary artery disease Mother   . Heart failure Mother   . Hypertension Mother   . Anxiety disorder Son   . Lung cancer Brother        + smoker  . Breast cancer Neg Hx    Allergies  Allergen Reactions  . Calcium     REACTION: cannot tolerate any calcium supplement - GI sympotms  . Fluticasone Propionate     REACTION: headache/irritation  . Hctz [Hydrochlorothiazide] Other (See Comments)    Eye redness and discomfort/ slt blurred vision   .  Paroxetine     REACTION: abdominal pain and constipation   Current Outpatient Medications on File Prior to Visit  Medication Sig Dispense Refill  . loratadine (CLARITIN) 10 MG tablet Take 10 mg by mouth daily as needed for allergies.    Marland Kitchen LORazepam (ATIVAN) 1 MG tablet TAKE 1/2 (ONE-HALF) TABLET BY MOUTH AT BEDTIME AS NEEDED 15 tablet 3  . meclizine (ANTIVERT) 25 MG tablet Take 1 tablet (25 mg total) by mouth 3 (three) times daily as needed for dizziness. 15 tablet 0  . metoprolol succinate (TOPROL-XL) 25 MG 24 hr tablet Take 0.5 tablets (12.5 mg total) by mouth daily. 45 tablet 1  . rivaroxaban (XARELTO) 20 MG TABS tablet Take 1 tablet (20 mg total) by mouth daily with supper. 30 tablet 6   No current facility-administered medications on file prior to visit.    Review of Systems  Constitutional: Negative for activity change, appetite change, fatigue, fever and unexpected weight change.  HENT: Negative for congestion, ear pain, rhinorrhea, sinus pressure and sore throat.   Eyes: Negative for pain, redness and visual disturbance.  Respiratory: Negative for cough, shortness of breath and wheezing.   Cardiovascular: Negative for chest pain and palpitations.  Gastrointestinal: Negative for abdominal pain, blood in stool, constipation and diarrhea.  Endocrine: Negative for polydipsia and polyuria.  Genitourinary: Negative for dysuria, frequency and urgency.  Musculoskeletal: Negative for arthralgias, back pain and myalgias.  Skin: Negative for  pallor and rash.  Allergic/Immunologic: Negative for environmental allergies.  Neurological: Negative for dizziness, syncope and headaches.  Hematological: Negative for adenopathy. Does not bruise/bleed easily.  Psychiatric/Behavioral: Negative for decreased concentration and dysphoric mood. The patient is not nervous/anxious.        Objective:   Physical Exam Constitutional:      General: She is not in acute distress.    Appearance: Normal appearance. She is well-developed and normal weight. She is not ill-appearing or diaphoretic.  HENT:     Head: Normocephalic and atraumatic.     Right Ear: Tympanic membrane, ear canal and external ear normal.     Left Ear: Tympanic membrane, ear canal and external ear normal.     Nose: Nose normal. No congestion.     Mouth/Throat:     Mouth: Mucous membranes are moist.     Pharynx: Oropharynx is clear. No posterior oropharyngeal erythema.  Eyes:     General: No scleral icterus.    Extraocular Movements: Extraocular movements intact.     Conjunctiva/sclera: Conjunctivae normal.     Pupils: Pupils are equal, round, and reactive to light.  Neck:     Thyroid: No thyromegaly.     Vascular: No carotid bruit or JVD.  Cardiovascular:     Rate and Rhythm: Normal rate and regular rhythm.     Pulses: Normal pulses.     Heart sounds: Normal heart sounds. No gallop.   Pulmonary:     Effort: Pulmonary effort is normal. No respiratory distress.     Breath sounds: Normal breath sounds. No wheezing.     Comments: Good air exch Chest:     Chest wall: No tenderness.  Abdominal:     General: Bowel sounds are normal. There is no distension or abdominal bruit.     Palpations: Abdomen is soft. There is no mass.     Tenderness: There is no abdominal tenderness.     Hernia: No hernia is present.  Genitourinary:    Comments: Breast exam: No mass, nodules, thickening, tenderness, bulging, retraction,  inflamation, nipple discharge or skin changes noted.  No  axillary or clavicular LA.     Musculoskeletal:        General: No tenderness. Normal range of motion.     Cervical back: Normal range of motion and neck supple. No rigidity. No muscular tenderness.     Right lower leg: No edema.     Left lower leg: No edema.  Lymphadenopathy:     Cervical: No cervical adenopathy.  Skin:    General: Skin is warm and dry.     Coloration: Skin is not pale.     Findings: No erythema or rash.     Comments: Solar lentigines diffusely (pt recently saw dermatology for screen) Scattered sks  Neurological:     Mental Status: She is alert. Mental status is at baseline.     Cranial Nerves: No cranial nerve deficit.     Motor: No abnormal muscle tone.     Coordination: Coordination normal.     Gait: Gait normal.     Deep Tendon Reflexes: Reflexes are normal and symmetric. Reflexes normal.  Psychiatric:        Mood and Affect: Mood normal.        Cognition and Memory: Cognition and memory normal.           Assessment & Plan:   Problem List Items Addressed This Visit      Cardiovascular and Mediastinum   Essential hypertension    bp in fair control at this time  BP Readings from Last 1 Encounters:  08/30/19 120/68   No changes needed Most recent labs reviewed  Disc lifstyle change with low sodium diet and exercise        Relevant Medications   amLODipine (NORVASC) 5 MG tablet   Paroxysmal atrial fibrillation (HCC)    Rate controlled occ short episode Continues xarelto        Relevant Medications   amLODipine (NORVASC) 5 MG tablet     Endocrine   Hypothyroidism    Hypothyroidism  Pt has no clinical changes No change in energy level/ hair or skin/ edema and no tremor Lab Results  Component Value Date   TSH 1.23 08/23/2019    Continues levothyroxine 50 mcg       Relevant Medications   levothyroxine (SYNTHROID) 50 MCG tablet     Musculoskeletal and Integument   Disorder of bone and cartilage    Pt declines another dexa  No  falls or fractures Taking vit d Walking for exercise         Other   Generalized anxiety disorder    No clinical changes  Doing well Planning retirement      Routine general medical examination at a health care facility - Primary    Reviewed health habits including diet and exercise and skin cancer prevention Reviewed appropriate screening tests for age  Also reviewed health mt list, fam hx and immunization status , as well as social and family history   See HPI Labs reviewed  Stable weight/ good habits Declines prevnar vaccine  Signed up for cologuard for colon cancer screen (she declines colonoscopy) dexa 5/14 with osteopenia-she declines another for now  No falls or fx and she continues her vit D         Colon cancer screening    Ref for cologaurd test Declines colonoscopy

## 2019-09-14 ENCOUNTER — Other Ambulatory Visit (HOSPITAL_COMMUNITY): Payer: Self-pay | Admitting: Physician Assistant

## 2019-09-14 LAB — COLOGUARD

## 2019-09-18 LAB — COLOGUARD

## 2019-09-27 LAB — COLOGUARD: Cologuard: NEGATIVE

## 2019-10-03 LAB — COLOGUARD: COLOGUARD: NEGATIVE

## 2019-10-12 ENCOUNTER — Other Ambulatory Visit: Payer: Self-pay

## 2019-10-12 ENCOUNTER — Emergency Department (HOSPITAL_COMMUNITY)
Admission: EM | Admit: 2019-10-12 | Discharge: 2019-10-12 | Disposition: A | Discharge status: Home / Self Care | Payer: 59 | Attending: Emergency Medicine | Admitting: Emergency Medicine

## 2019-10-12 ENCOUNTER — Encounter (HOSPITAL_COMMUNITY): Payer: Self-pay

## 2019-10-12 ENCOUNTER — Emergency Department (HOSPITAL_COMMUNITY): Payer: 59

## 2019-10-12 DIAGNOSIS — R000 Tachycardia, unspecified: Secondary | ICD-10-CM | POA: Diagnosis present

## 2019-10-12 DIAGNOSIS — I4891 Unspecified atrial fibrillation: Secondary | ICD-10-CM | POA: Diagnosis not present

## 2019-10-12 DIAGNOSIS — Z87891 Personal history of nicotine dependence: Secondary | ICD-10-CM | POA: Diagnosis not present

## 2019-10-12 DIAGNOSIS — Z79890 Hormone replacement therapy (postmenopausal): Secondary | ICD-10-CM | POA: Diagnosis not present

## 2019-10-12 LAB — BASIC METABOLIC PANEL
Anion gap: 13 (ref 5–15)
BUN: 16 mg/dL (ref 8–23)
CO2: 19 mmol/L — ABNORMAL LOW (ref 22–32)
Calcium: 9.6 mg/dL (ref 8.9–10.3)
Chloride: 106 mmol/L (ref 98–111)
Creatinine, Ser: 0.94 mg/dL (ref 0.44–1.00)
GFR calc Af Amer: >60 mL/min (ref >60)
GFR calc non Af Amer: >60 mL/min (ref >60)
Glucose, Bld: 153 mg/dL — ABNORMAL HIGH (ref 70–99)
Potassium: 3.3 mmol/L — ABNORMAL LOW (ref 3.5–5.1)
Sodium: 138 mmol/L (ref 135–145)

## 2019-10-12 LAB — CBC WITH DIFFERENTIAL/PLATELET
Abs Immature Granulocytes: 0.01 10*3/uL (ref 0.00–0.07)
Basophils Absolute: 0 10*3/uL (ref 0.0–0.1)
Basophils Relative: 0 %
Eosinophils Absolute: 0 10*3/uL (ref 0.0–0.5)
Eosinophils Relative: 1 %
HCT: 38.5 % (ref 36.0–46.0)
Hemoglobin: 13.8 g/dL (ref 12.0–15.0)
Immature Granulocytes: 0 %
Lymphocytes Relative: 18 %
Lymphs Abs: 0.8 10*3/uL (ref 0.7–4.0)
MCH: 32.3 pg (ref 26.0–34.0)
MCHC: 35.8 g/dL (ref 30.0–36.0)
MCV: 90.2 fL (ref 80.0–100.0)
Monocytes Absolute: 0.4 10*3/uL (ref 0.1–1.0)
Monocytes Relative: 8 %
Neutro Abs: 3.2 10*3/uL (ref 1.7–7.7)
Neutrophils Relative %: 73 %
Platelets: 210 10*3/uL (ref 150–400)
RBC: 4.27 MIL/uL (ref 3.87–5.11)
RDW: 12.8 % (ref 11.5–15.5)
WBC: 4.5 10*3/uL (ref 4.0–10.5)
nRBC: 0 % (ref 0.0–0.2)

## 2019-10-12 LAB — TROPONIN I (HIGH SENSITIVITY)
Troponin I (High Sensitivity): 22 ng/L — ABNORMAL HIGH (ref <18)
Troponin I (High Sensitivity): 43 ng/L — ABNORMAL HIGH (ref <18)

## 2019-10-12 LAB — MAGNESIUM: Magnesium: 1.8 mg/dL (ref 1.7–2.4)

## 2019-10-12 IMAGING — DX DG CHEST 1V PORT
1 series · 1 of 1 positions shown · non-contrast
Comparison: [DATE]

CLINICAL DATA: Sudden onset of palpitations.

EXAM:
PORTABLE CHEST 1 VIEW

[chest ap]
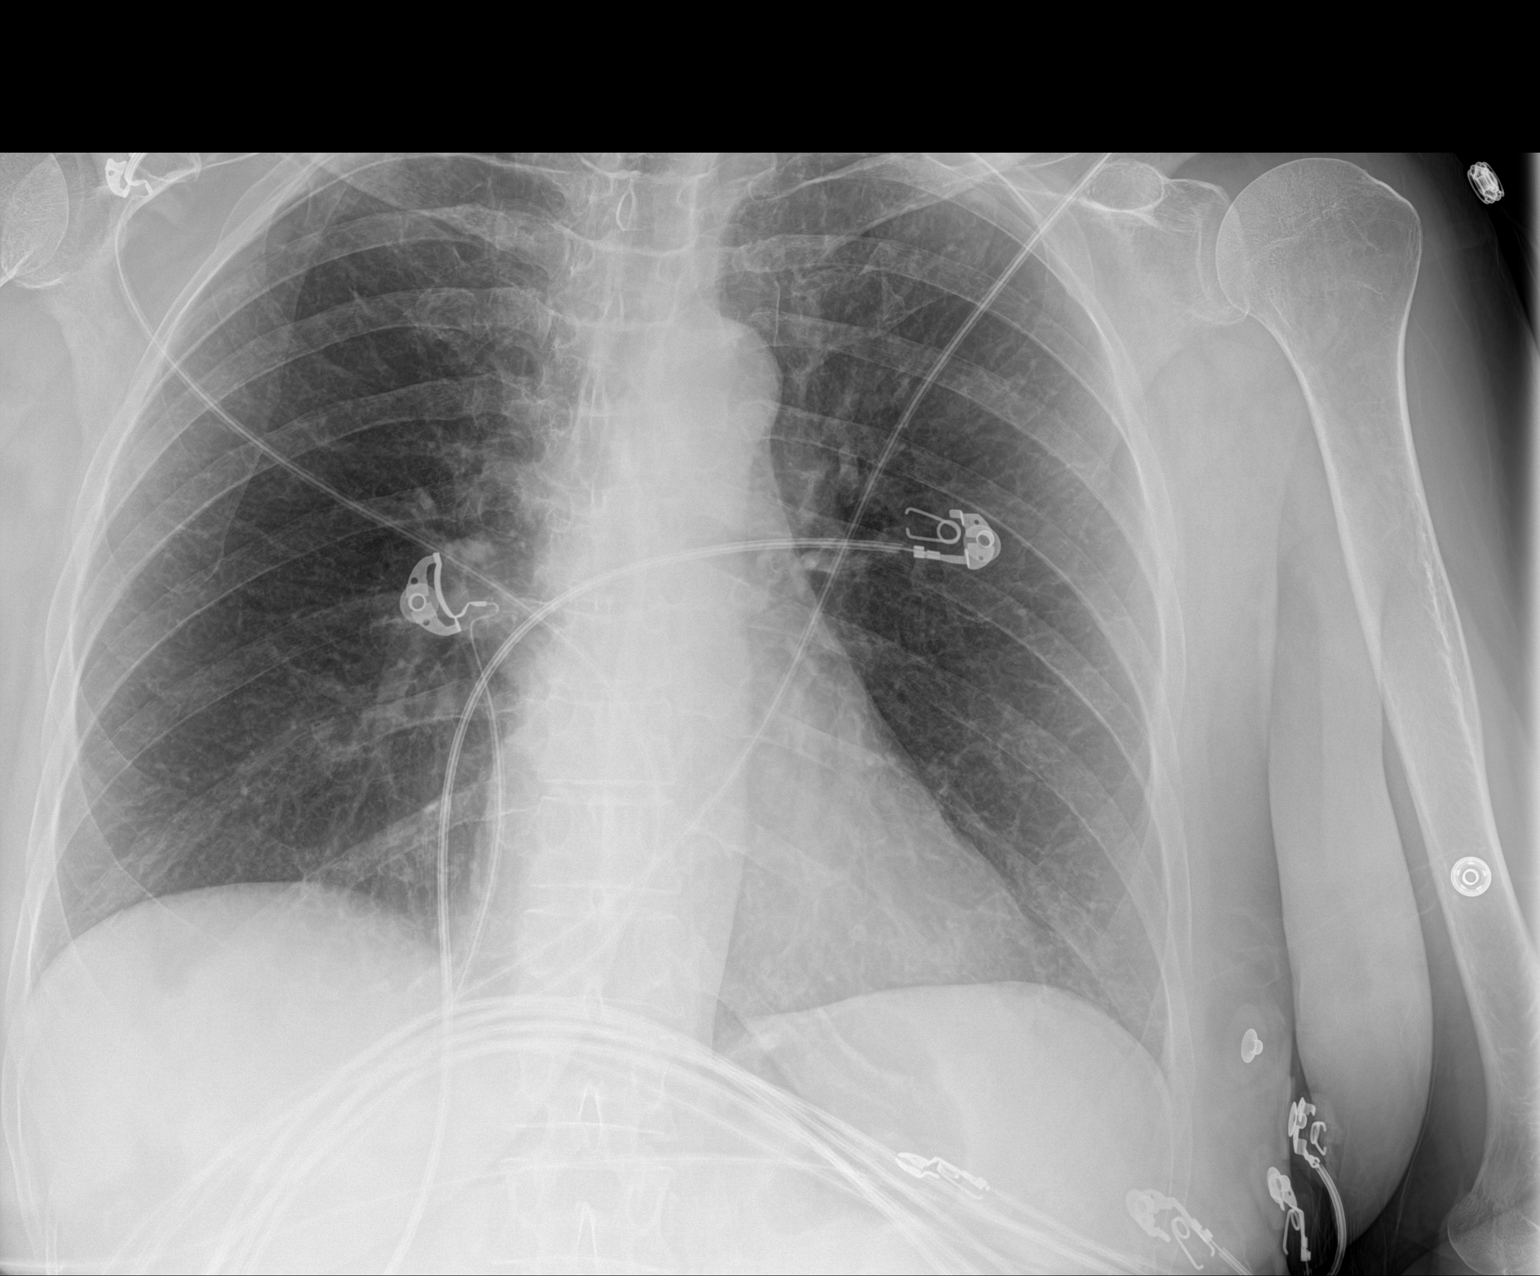

[1 of 1 positions shown; findings below may reference images not displayed]

FINDINGS: The heart size and mediastinal contours are within normal limits.
Both lungs are clear. The visualized skeletal structures are
unremarkable.
IMPRESSION: No active disease.

## 2019-10-12 MED ORDER — PROPOFOL 10 MG/ML IV BOLUS
0.5000 mg/kg | Freq: Once | INTRAVENOUS | Status: DC
  Filled 2019-10-12: qty 20

## 2019-10-12 MED ORDER — PROPOFOL 10 MG/ML IV BOLUS
INTRAVENOUS | Status: AC | PRN
  Administered 2019-10-12: 35 mg via INTRAVENOUS

## 2019-10-12 NOTE — ED Notes (Signed)
Pt verified transportation to home if disposition is discharge.

## 2019-10-12 NOTE — ED Notes (Signed)
Pt signed consent for Cardioversion and Procedural Sedation. Copies placed in Medical Records to be scanned into patients records.

## 2019-10-12 NOTE — ED Notes (Addendum)
EDP made aware of Elevated Troponin, no new orders at this time.

## 2019-10-12 NOTE — Sedation Documentation (Signed)
First Shock Delivered at [facility]. EKG provided to MD Karle Starch

## 2019-10-12 NOTE — ED Provider Notes (Signed)
[facility] EMERGENCY DEPARTMENT Provider Note  CSN: [id number] Arrival date & time: 10/12/19 1641    History Chief Complaint  Patient presents with  . Tachycardia  . Atrial Fibrillation    HPI  Denise Grant is a 64 y.o. female with history of paroxysmal afib, managed by Afib clinic, taking Xarelto daily. She reports around 2:30pm today she had onset of rapid heart rate, nausea and SOB. No chest pain. She was given Cardizem by EMS without improvement. Has had DC cardioversion in the past.   Past Medical History:  Diagnosis Date  . Actinic keratosis   . Allergic rhinitis   . Anxiety   . Chiari malformation   . DDD (degenerative disc disease)   . Family history of other specified malignant neoplasm   . Headache(784.0)   . Insomnia, unspecified   . Lactose intolerance   . Mitral valve disorders(424.0)   . Osteopenia   . Seborrheic dermatitis, unspecified   . Vaginal cyst    stable    Past Surgical History:  Procedure Laterality Date  . AIR/FLUID EXCHANGE Right 07/25/2018   Procedure: Air/Fluid Exchange;  Surgeon: Jalene Mullet, MD;  Location: [facility];  Service: Ophthalmology;  Laterality: Right;  . bone spur removal  10/06   shoulder  . CATARACT EXTRACTION, BILATERAL Bilateral 2016  . [place] SURGERY  2005  . Chiari Malformation surgery  6/03  . EXCISION MORTON'S NEUROMA  2/04  . GAS INSERTION Right 07/25/2018   Procedure: Insertion Of Gas SF6;  Surgeon: Jalene Mullet, MD;  Location: [place];  Service: Ophthalmology;  Laterality: Right;  . LUMBAR DISC SURGERY    . 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 TUBAL LIGATION      Family History  Problem Relation Age of Onset  . Melanoma Sister   . Lung cancer Father   . Coronary  artery disease Mother   . Heart failure Mother   . Hypertension Mother   . Anxiety disorder Son   . Lung cancer Brother        + smoker  . Breast cancer Neg Hx     Social History   Tobacco Use  . Smoking status: Former Smoker    Quit date: 01/17/2001    Years since quitting: 18.7  . Smokeless tobacco: Never Used  Substance Use Topics  . Alcohol use: No    Alcohol/week: 0.0 standard drinks  . Drug use: No     Home Medications Prior to Admission medications   Medication Sig Start Date End Date Taking? Authorizing Provider  amLODipine (NORVASC) 5 MG tablet Take 1 tablet (5 mg total) by mouth daily. 08/30/19   Tower, Wynelle Fanny, MD  famotidine (PEPCID) 40 MG tablet Take 1 tablet (40 mg total) by mouth at bedtime. 08/30/19   Tower, Wynelle Fanny, MD  levothyroxine (SYNTHROID) 50 MCG tablet TAKE 1 TABLET BY MOUTH DAILY BEFORE BREAKFAST 08/30/19   Tower, Wynelle Fanny, MD  loratadine (CLARITIN) 10 MG tablet Take 10 mg by mouth daily as needed for allergies.    [provider]  LORazepam (ATIVAN) 1 MG tablet TAKE 1/2 (ONE-HALF) TABLET BY MOUTH AT BEDTIME AS NEEDED 08/07/19   Tower, Wynelle Fanny, MD  meclizine (ANTIVERT) 25 MG tablet Take 1 tablet (25 mg total) by mouth 3 (  three) times daily as needed for dizziness. 05/21/14   Tower, Wynelle Fanny, MD  metoprolol succinate (TOPROL-XL) 25 MG 24 hr tablet Take 0.5 tablets (12.5 mg total) by mouth daily. 04/12/19   Fenton, Clint R, PA  XARELTO 20 MG TABS tablet TAKE 1 TABLET (20 MG TOTAL) BY MOUTH DAILY WITH SUPPER. 09/16/19   Fenton, Clint R, PA     Allergies    Calcium, Fluticasone propionate, Hctz [hydrochlorothiazide], and Paroxetine   Review of Systems   Review of Systems A comprehensive review of systems was completed and negative except as noted in HPI.    Physical Exam BP (!) 143/71 (BP Location: Right Arm)   Pulse (!) 140   Temp 97.7 F (36.5 C) (Oral)   Resp (!) 21   Ht 5\' 6"  (1.676 m)   Wt 70 kg   SpO2 100%   BMI 24.91 kg/m   Physical  Exam Vitals and nursing note reviewed.  Constitutional:      Appearance: Normal appearance.  HENT:     Head: Normocephalic and atraumatic.     Nose: Nose normal.     Mouth/Throat:     Mouth: Mucous membranes are moist.  Eyes:     Extraocular Movements: Extraocular movements intact.     Conjunctiva/sclera: Conjunctivae normal.  Cardiovascular:     Rate and Rhythm: Tachycardia present. Rhythm irregular.  Pulmonary:     Effort: Pulmonary effort is normal.     Breath sounds: Normal breath sounds.  Abdominal:     General: Abdomen is flat.     Palpations: Abdomen is soft.     Tenderness: There is no abdominal tenderness.  Musculoskeletal:        General: No swelling. Normal range of motion.     Cervical back: Neck supple.  Skin:    General: Skin is warm and dry.  Neurological:     General: No focal deficit present.     Mental Status: She is alert.  Psychiatric:        Mood and Affect: Mood normal.      ED Results / Procedures / Treatments   Labs (all labs ordered are listed, but only abnormal results are displayed) Labs Reviewed  BASIC METABOLIC PANEL  CBC WITH DIFFERENTIAL/PLATELET  MAGNESIUM  TROPONIN I (HIGH SENSITIVITY)    EKG None  Radiology No results found.  Procedures .Cardioversion  Date/Time: 10/12/2019 8:18 PM Performed by: Truddie Hidden, MD Authorized by: Truddie Hidden, MD   Consent:    Consent obtained:  Verbal and written   Consent given by:  Patient   Risks discussed:  Cutaneous burn and pain   Alternatives discussed:  No treatment Pre-procedure details:    Cardioversion basis:  Emergent   Rhythm:  Atrial fibrillation   Electrode placement:  Anterior-posterior Patient sedated: Yes. Refer to sedation procedure documentation for details of sedation.  Attempt one:    Cardioversion mode:  Synchronous   Waveform:  Biphasic   Shock (Joules):  200   Shock outcome:  Conversion to normal sinus rhythm Post-procedure details:     Patient status:  Awake   Patient tolerance of procedure:  Tolerated well, no immediate complications .Sedation  Date/Time: 10/12/2019 8:19 PM Performed by: Truddie Hidden, MD Authorized by: Truddie Hidden, MD   Consent:    Consent obtained:  Verbal and written   Consent given by:  Patient   Risks discussed:  Prolonged hypoxia resulting in organ damage and respiratory compromise necessitating ventilatory assistance and  intubation   Alternatives discussed:  Analgesia without sedation Universal protocol:    Immediately prior to procedure a time out was called: yes     Patient identity confirmation method:  Arm band Indications:    Procedure performed:  Cardioversion Pre-sedation assessment:    Time since last food or drink:  4   ASA classification: class 2 - patient with mild systemic disease     Neck mobility: normal     Mouth opening:  3 or more finger widths   Mallampati score:  I - soft palate, uvula, fauces, pillars visible   Pre-sedation assessments completed and reviewed: airway patency, cardiovascular function, mental status and respiratory function     Pre-sedation assessment completed:  10/12/2019 8:10 PM Immediate pre-procedure details:    Reassessment: Patient reassessed immediately prior to procedure     Reviewed: vital signs and NPO status     Verified: bag valve mask available, emergency equipment available, oxygen available and suction available   Procedure details (see MAR for exact dosages):    Preoxygenation:  Nasal cannula   Sedation:  Propofol   Intended level of sedation: deep   Intra-procedure monitoring:  Blood pressure monitoring, continuous pulse oximetry, frequent LOC assessments, frequent vital sign checks and cardiac monitor   Intra-procedure events: none     Total Provider sedation time (minutes):  15 Post-procedure details:    Post-sedation assessment completed:  10/12/2019 8:20 PM   Attendance: Constant attendance by certified staff until  patient recovered     Post-sedation assessments completed and reviewed: airway patency, cardiovascular function and mental status     Patient is stable for discharge or admission: yes     Patient tolerance:  Tolerated well, no immediate complications    Medications Ordered in the ED Medications - No data to display   MDM Rules/Calculators/A&P MDM Patient with history of afib is back in rapid afib today, good history of onset just a few hours ago. Taking Xarelto as prescribed. Will check labs and anticipate cardioversion if no concerning findings.  ED Course  I have reviewed the triage vital signs and the nursing notes.  Pertinent labs & imaging results that were available during my care of the patient were reviewed by me and considered in my medical decision making (see chart for details).  Clinical Course as of Oct 12 2111  Sat Oct 12, 2019  1728 CBC is normal.    [CS]  1829 BMP and Mag are unremarkable. Trop #1 mildly elevated. Patient remains in Afib with rate 110-130s. Will discuss with Cardiology.    [CS]  5427 Spoke with Dr. Launa Grill, Cardiology, who agrees with plan for DCCV in the ED. Patient is amenable. Risk and benefits discussed.    [CS]  2022 Spoke again with Dr. Launa Grill, Cardiology regarding mildly elevated Trop. He suspects this is due to HR as she was not having any CP and on ischemic changes on EKG. Does not feel she needs to be admitted for same. Cardioversion in ED successful with return to NSR.    [CS]  2110 Patient remains in NSR and would like to go home. She will follow up with Afib Clinic next week. RTED for any other concerns. Advised to continue her normal medications.    [CS]    Clinical Course User Index [CS] Truddie Hidden, MD    Final Clinical Impression(s) / ED Diagnoses Final diagnoses:  A-fib [facility])    Rx / DC Orders ED Discharge Orders    None  Truddie Hidden, MD 10/12/19 2113

## 2019-10-12 NOTE — ED Triage Notes (Signed)
Pt BIB [facility] [place] with [place].   [place] and had sudden onset of palpitations (bird in the chest) along with nausea. No pain.  Upon EMS arrival pt was in 180s-210s. EMS gave a total of: 35 mg of Cardizem (10 twice and then 15 again).  50 ml of NS  VSS with EMS besides HR (BP with EMS 140/80).  Hx of A-Fib with Cardioversion approx. 1 year ago.

## 2019-10-14 ENCOUNTER — Encounter: Payer: Self-pay | Admitting: Family Medicine

## 2019-10-16 NOTE — Progress Notes (Signed)
Primary Care Physician: Tower, Wynelle Fanny, MD Primary Cardiologist: none Primary Electrophysiologist: none Referring Physician: [facility]   Denise Grant is a 64 y.o. female with a history of Chiari malformation, HTN, hypothyroidism, MVP, generalized anxiety, and paroxysmal atrial fibrillation who presents for follow up in the [facility]. The patient was initially diagnosed with atrial fibrillation on 09/19/18 after presenting to the ER with symptoms of SOB, chest discomfort, and weakness. She was found to be in afib with RVR and underwent successful DCCV. There were no specific triggers that the patient could identify. She denies snoring or significant alcohol use. She had an echo 09/26/18 which showed preserved EF and a Zio patch 10/01/18 which showed no afib.   On follow up today, patient presented to the ED on 10/12/19 with symptoms of heart racing, nausea, and SOB. ECG showed afib with RVR. She underwent DCCV at that time. She denies any bleeding issues with anticoagulation.   Today, she denies symptoms of chest pain, shortness of breath, orthopnea, PND, lower extremity edema, dizziness, presyncope, syncope, snoring, daytime somnolence, bleeding, or neurologic sequela. The patient is tolerating medications without difficulties and is otherwise without complaint today.    Atrial Fibrillation Risk Factors:  she does not have symptoms or diagnosis of sleep apnea. she does not have a history of rheumatic fever. she does not have a history of alcohol use. The patient does have a history of early familial atrial fibrillation or other arrhythmias. Sister has afib, brother has PPM.  she has a BMI of Body mass index is 25.02 kg/m.Marland Kitchen Filed Weights   10/17/19 0916  Weight: 70.3 kg    Family History  Problem Relation Age of Onset  . Melanoma Sister   . Lung cancer Father   . Coronary artery disease Mother   . Heart failure Mother   . Hypertension Mother   .  Anxiety disorder Son   . Lung cancer Brother        + smoker  . Breast cancer Neg Hx      Atrial Fibrillation Management history:  Previous antiarrhythmic drugs: none Previous cardioversions: 09/19/18, 10/12/19 Previous ablations: none CHADS2VASC score: 3 Anticoagulation history: Xarelto   Past Medical History:  Diagnosis Date  . Actinic keratosis   . Allergic rhinitis   . Anxiety   . Chiari malformation   . DDD (degenerative disc disease)   . Family history of other specified malignant neoplasm   . Headache(784.0)   . Insomnia, unspecified   . Lactose intolerance   . Mitral valve disorders(424.0)   . Osteopenia   . Seborrheic dermatitis, unspecified   . Vaginal cyst    stable   Past Surgical History:  Procedure Laterality Date  . AIR/FLUID EXCHANGE Right 07/25/2018   Procedure: Air/Fluid Exchange;  Surgeon: Jalene Mullet, MD;  Location: [facility];  Service: Ophthalmology;  Laterality: Right;  . bone spur removal  10/06   shoulder  . CATARACT EXTRACTION, BILATERAL Bilateral 2016  . [place] SURGERY  2005  . Chiari Malformation surgery  6/03  . EXCISION MORTON'S NEUROMA  2/04  . GAS INSERTION Right 07/25/2018   Procedure: Insertion Of Gas SF6;  Surgeon: Jalene Mullet, MD;  Location: [facility];  Service: Ophthalmology;  Laterality: Right;  . LUMBAR DISC SURGERY    . 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   Procedure: REPAIR OF COMPLEX TRACTION RETINAL DETACHMENT - 25GA VITRECTOMY, ENDOLASER, DRAINAGE OF SUBRETINAL FLUID;  Surgeon: Jalene Mullet,  MD;  Location: [facility];  Service: Ophthalmology;  Laterality: Right;  . TUBAL LIGATION      Current Outpatient Medications  Medication Sig Dispense Refill  . amLODipine (NORVASC) 5 MG tablet Take 1 tablet (5 mg total) by mouth daily. 90 tablet 3  . DENTA 5000 PLUS 1.1 % CREA dental cream Take 1 application by mouth at bedtime.     . famotidine (PEPCID) 40 MG tablet Take 1 tablet (40 mg total) by mouth at  bedtime. 90 tablet 3  . levothyroxine (SYNTHROID) 50 MCG tablet TAKE 1 TABLET BY MOUTH DAILY BEFORE BREAKFAST 90 tablet 3  . loratadine (CLARITIN) 10 MG tablet Take 10 mg by mouth daily as needed for allergies.    Marland Kitchen LORazepam (ATIVAN) 1 MG tablet TAKE 1/2 (ONE-HALF) TABLET BY MOUTH AT BEDTIME AS NEEDED 15 tablet 3  . meclizine (ANTIVERT) 25 MG tablet Take 1 tablet (25 mg total) by mouth 3 (three) times daily as needed for dizziness. 15 tablet 0  . metoprolol succinate (TOPROL-XL) 25 MG 24 hr tablet Take 0.5 tablets (12.5 mg total) by mouth daily. 45 tablet 1  . XARELTO 20 MG TABS tablet TAKE 1 TABLET (20 MG TOTAL) BY MOUTH DAILY WITH SUPPER. 90 tablet 2  . flecainide (TAMBOCOR) 50 MG tablet Take 1 tablet (50 mg total) by mouth 2 (two) times daily. 60 tablet 3   No current facility-administered medications for this encounter.    Allergies  Allergen Reactions  . Calcium     REACTION: cannot tolerate any calcium supplement - GI sympotms  . Fluticasone Propionate     REACTION: headache/irritation  . Hctz [Hydrochlorothiazide] Other (See Comments)    Eye redness and discomfort/ slt blurred vision   . Paroxetine     REACTION: abdominal pain and constipation    Social History   Socioeconomic History  . Marital status: Married    Spouse name: Not on file  . Number of children: Not on file  . Years of education: Not on file  . Highest education level: Not on file  Occupational History  . Not on file  Tobacco Use  . Smoking status: Former Smoker    Quit date: 01/17/2001    Years since quitting: 18.7  . Smokeless tobacco: Never Used  Substance and Sexual Activity  . Alcohol use: No    Alcohol/week: 0.0 standard drinks  . Drug use: No  . Sexual activity: Not on file  Other Topics Concern  . Not on file  Social History Narrative   Walks daily for exercise      Married--husband diagnosed with stomach cancer (5/11)   Lost her husband 4/12 to cancer    Social Determinants of Health    Financial Resource Strain:   . Difficulty of Paying Living Expenses: Not on file  Food Insecurity:   . Worried About Charity fundraiser in the Last Year: Not on file  . Ran Out of Food in the Last Year: Not on file  Transportation Needs:   . Lack of Transportation (Medical): Not on file  . Lack of Transportation (Non-Medical): Not on file  Physical Activity:   . Days of Exercise per Week: Not on file  . Minutes of Exercise per Session: Not on file  Stress:   . Feeling of Stress : Not on file  Social Connections:   . Frequency of Communication with Friends and Family: Not on file  . Frequency of Social Gatherings with Friends and Family: Not on file  .  Attends Religious Services: Not on file  . Active Member of Clubs or Organizations: Not on file  . Attends Archivist Meetings: Not on file  . Marital Status: Not on file  Intimate Partner Violence:   . Fear of Current or Ex-Partner: Not on file  . Emotionally Abused: Not on file  . Physically Abused: Not on file  . Sexually Abused: Not on file     ROS- All systems are reviewed and negative except as per the HPI above.  Physical Exam: Vitals:   10/17/19 0916  BP: 132/60  Pulse: (!) 53  Weight: 70.3 kg  Height: 5\' 6"  (1.676 m)    GEN- The patient is well appearing, alert and oriented x 3 today.   HEENT-head normocephalic, atraumatic, sclera clear, conjunctiva pink, hearing intact, trachea midline. Lungs- Clear to ausculation bilaterally, normal work of breathing Heart- Regular rate and rhythm, bradycardia, no murmurs, rubs or gallops  GI- soft, NT, ND, + BS Extremities- no clubbing, cyanosis, or edema MS- no significant deformity or atrophy Skin- no rash or lesion Psych- euthymic mood, full affect Neuro- strength and sensation are intact   Wt Readings from Last 3 Encounters:  10/17/19 70.3 kg  10/12/19 70 kg  08/30/19 70.3 kg    EKG today demonstrates SB HR 53, PR 144, QRS 82, QTc 386  Echo  09/26/18 1. The left ventricle has normal systolic function with an ejection fraction of 60-65%. The cavity size was normal. Left ventricular diastolic parameters were normal. No evidence of left ventricular regional wall motion abnormalities.  2. The right ventricle has normal systolic function. The cavity was normal. There is no increase in right ventricular wall thickness. Right ventricular systolic pressure is normal with an estimated pressure of 21.8 mmHg.  3. The pericardial effusion is circumferential.  4. Trivial pericardial effusion is present.  5. Mild thickening of the mitral valve leaflet. There is mild mitral annular calcification present.  6. Mild thickening of the aortic valve. Aortic valve regurgitation is mild by color flow Doppler.  7. The aorta is normal unless otherwise noted.  Epic records are reviewed at length today   CHA2DS2-VASc Score = 3  The patient's score is based upon: CHF History: 0 HTN History: 1 Age : 1 Diabetes History: 0 Stroke History: 0 Vascular Disease History: 0 Gender: 1      ASSESSMENT AND PLAN: 1. Paroxysmal Atrial Fibrillation (ICD10:  I48.0) The patient's CHA2DS2-VASc score is 3, indicating a 3.2% annual risk of stroke.   S/p DCCV on 10/12/19 We discussed therapeutic options today including AAD and ablation.  Will start flecainide 50 mg BID. Patient willing to consider ablation if she fails flecainide or has intolerable side effects.  Continue Toprol 12.5 mg daily Continue Xarelto 20 mg daily  2. Secondary Hypercoagulable State (ICD10:  D68.69) The patient is at significant risk for stroke/thromboembolism based upon her CHA2DS2-VASc Score of 3.  Continue Rivaroxaban (Xarelto).   3. HTN Stable, no changes today.   Follow up in the AF clinic 5-7 days after starting flecainide. (Patient will be out of town and will start flecainide 10/18)   Adline Peals PA-C [facility] [place], [place]  [phone] 10/17/2019 11:06 AM

## 2019-10-17 ENCOUNTER — Other Ambulatory Visit: Payer: Self-pay

## 2019-10-17 ENCOUNTER — Ambulatory Visit (HOSPITAL_COMMUNITY)
Admission: RE | Admit: 2019-10-17 | Discharge: 2019-10-17 | Disposition: A | Discharge status: Home / Self Care | Payer: 59 | Source: Ambulatory Visit | Attending: Physician Assistant | Admitting: Physician Assistant

## 2019-10-17 ENCOUNTER — Encounter (HOSPITAL_COMMUNITY): Payer: Self-pay | Admitting: Physician Assistant

## 2019-10-17 VITALS — BP 132/60 | HR 53 | Ht 66.0 in | Wt 155.0 lb

## 2019-10-17 DIAGNOSIS — Z79890 Hormone replacement therapy (postmenopausal): Secondary | ICD-10-CM | POA: Insufficient documentation

## 2019-10-17 DIAGNOSIS — Z7901 Long term (current) use of anticoagulants: Secondary | ICD-10-CM | POA: Diagnosis not present

## 2019-10-17 DIAGNOSIS — I480 Paroxysmal atrial fibrillation: Secondary | ICD-10-CM

## 2019-10-17 DIAGNOSIS — E039 Hypothyroidism, unspecified: Secondary | ICD-10-CM | POA: Diagnosis not present

## 2019-10-17 DIAGNOSIS — Z87891 Personal history of nicotine dependence: Secondary | ICD-10-CM | POA: Insufficient documentation

## 2019-10-17 DIAGNOSIS — I10 Essential (primary) hypertension: Secondary | ICD-10-CM | POA: Diagnosis not present

## 2019-10-17 DIAGNOSIS — F419 Anxiety disorder, unspecified: Secondary | ICD-10-CM | POA: Diagnosis not present

## 2019-10-17 DIAGNOSIS — D6869 Other thrombophilia: Secondary | ICD-10-CM

## 2019-10-17 DIAGNOSIS — Z79899 Other long term (current) drug therapy: Secondary | ICD-10-CM | POA: Insufficient documentation

## 2019-10-17 MED ORDER — FLECAINIDE ACETATE 50 MG PO TABS: 50.0000 mg | ORAL_TABLET | Freq: Two times a day (BID) | ORAL | 3 refills | Status: DC

## 2019-10-17 NOTE — Patient Instructions (Signed)
10/18 start flecainide 50mg  twice a day

## 2019-10-25 ENCOUNTER — Ambulatory Visit: Payer: 59 | Admitting: Family Medicine

## 2019-10-28 ENCOUNTER — Ambulatory Visit (INDEPENDENT_AMBULATORY_CARE_PROVIDER_SITE_OTHER): Payer: 59 | Admitting: Family Medicine

## 2019-10-28 ENCOUNTER — Encounter: Payer: Self-pay | Admitting: Family Medicine

## 2019-10-28 ENCOUNTER — Other Ambulatory Visit: Payer: Self-pay

## 2019-10-28 VITALS — BP 128/76 | HR 63 | Temp 96.9°F | Ht 66.0 in | Wt 158.0 lb

## 2019-10-28 DIAGNOSIS — I480 Paroxysmal atrial fibrillation: Secondary | ICD-10-CM

## 2019-10-28 DIAGNOSIS — F411 Generalized anxiety disorder: Secondary | ICD-10-CM

## 2019-10-28 DIAGNOSIS — R69 Illness, unspecified: Secondary | ICD-10-CM | POA: Diagnosis not present

## 2019-10-28 MED ORDER — SERTRALINE HCL 25 MG PO TABS: 25.0000 mg | ORAL_TABLET | Freq: Every day | ORAL | 5 refills | Status: DC

## 2019-10-28 NOTE — Patient Instructions (Addendum)
Keep exercising  Practice good self care  Eat a healthy diet and drink enough fluids   Take the generic zoloft 25 mg daily in the evening   Follow up with me about 1 month after starting it   If you feel worse instead of better/ or depressed stop it and let us know   If you want to talk to a counselor please let know

## 2019-10-28 NOTE — Progress Notes (Signed)
Subjective:    Patient ID: Denise Grant, female    DOB: [birth year], 64 y.o.   MRN: [id number]  This visit occurred during the SARS-CoV-2 public health emergency.  Safety protocols were in place, including screening questions prior to the visit, additional usage of staff PPE, and extensive cleaning of exam room while observing appropriate contact time as indicated for disinfecting solutions.    HPI Pt presents to discuss zoloft   Wt Readings from Last 3 Encounters:  10/28/19 158 lb (71.7 kg)  10/17/19 155 lb (70.3 kg)  10/12/19 154 lb 5.2 oz (70 kg)   25.50 kg/m  Newly retired oct 1   Saw cardiology for a fib  (had DCCV 10/12/19)  Martin Majestic to ER for recurrence -done then  F/u with afib clinic and discussed opt of tx  Chose to start flacainaide 50 mg bid and consider ablation later Continues toprol and xarelto    BP Readings from Last 3 Encounters:  10/28/19 128/76  10/17/19 132/60  10/12/19 115/60   Pulse Readings from Last 3 Encounters:  10/28/19 63  10/17/19 (!) 53  10/12/19 61   Cardiology suggested she go on something long term like zoloft for anxiety   She still gets quite anxious with events/change in schedule  Worries about family (that they will get sick) -she has to look after older siblings  Her older sister has dementia  Nothing new going on at home - is ok staying by herself  Some anxiety all the time   Thinks she has had anxiety since early 96s (it started with a bad panic attack then)  She was put on valium for a little while   Right now she has to take the lorazepam every night (almost)  occ needs in the am   Has never seen a counselor for anxiety   buspar did not agree with her-she felt funnly   Exercise- walking for at least 30 minutes 3-4 times per week   Getting up and moving around helps her anxiety - her coping mech Also going for a drive  Not a reader  Plays golf also (keeps her mind busy)  Retirement helped - she got rid of that  stress and less fatigued  She has some social anxiety-does not like large crowds   No depression symptoms at all Not down or hopeless   Thinks anxiety does bring on her a fib  This happened at her unexpected retirement party   Patient Active Problem List   Diagnosis Date Noted  . Secondary hypercoagulable state ([place]) 07/05/2019  . Localized swelling, mass, or lump of lower extremity 05/13/2019  . Lumbar disc disease   . Bilateral bunions   . Paroxysmal atrial fibrillation ([place]) 09/27/2018  . Essential hypertension 01/01/2015  . Hypothyroidism 06/25/2014  . Migraine with vertigo 05/21/2014  . History of Chiari malformation 05/21/2014  . Atrophic vaginitis 05/01/2013  . Colon cancer screening 04/20/2012  . Encounter for routine gynecological examination 11/24/2010  . Routine general medical examination at a health care facility 11/18/2010  . INSOMNIA 01/27/2010  . HEADACHE 03/31/2008  . Disorder of bone and cartilage 09/18/2007  . Generalized anxiety disorder 05/05/2006  . MITRAL VALVE PROLAPSE 05/05/2006  . ALLERGIC RHINITIS 05/05/2006  . FIBROCYSTIC BREAST DISEASE 05/05/2006  . SEBORRHEIC DERMATITIS 05/05/2006  . KERATOSIS, ACTINIC 05/05/2006  . NECK PAIN, CHRONIC 05/05/2006   Past Medical History:  Diagnosis Date  . Actinic keratosis   . Allergic rhinitis   . Anxiety   .  Chiari malformation   . DDD (degenerative disc disease)   . Family history of other specified malignant neoplasm   . Headache(784.0)   . Insomnia, unspecified   . Lactose intolerance   . Mitral valve disorders(424.0)   . Osteopenia   . Seborrheic dermatitis, unspecified   . Vaginal cyst    stable   Past Surgical History:  Procedure Laterality Date  . AIR/FLUID EXCHANGE Right 07/25/2018   Procedure: Air/Fluid Exchange;  Surgeon: Jalene Mullet, MD;  Location: [facility];  Service: Ophthalmology;  Laterality: Right;  . bone spur removal  10/06   shoulder  . CATARACT EXTRACTION, BILATERAL Bilateral  2016  . [place] SURGERY  2005  . Chiari Malformation surgery  6/03  . EXCISION MORTON'S NEUROMA  2/04  . GAS INSERTION Right 07/25/2018   Procedure: Insertion Of Gas SF6;  Surgeon: Jalene Mullet, MD;  Location: [facility];  Service: Ophthalmology;  Laterality: Right;  . LUMBAR DISC SURGERY    . 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 TUBAL LIGATION     Social History   Tobacco Use  . Smoking status: Former Smoker    Quit date: 01/17/2001    Years since quitting: 18.7  . Smokeless tobacco: Never Used  Substance Use Topics  . Alcohol use: No    Alcohol/week: 0.0 standard drinks  . Drug use: No   Family History  Problem Relation Age of Onset  . Melanoma Sister   . Lung cancer Father   . Coronary artery disease Mother   . Heart failure Mother   . Hypertension Mother   . Anxiety disorder Son   . Lung cancer Brother        + smoker  . Breast cancer Neg Hx    Allergies  Allergen Reactions  . Calcium     REACTION: cannot tolerate any calcium supplement - GI sympotms  . Fluticasone Propionate     REACTION: headache/irritation  . Hctz [Hydrochlorothiazide] Other (See Comments)    Eye redness and discomfort/ slt blurred vision   . Paroxetine     REACTION: abdominal pain and constipation   Current Outpatient Medications on File Prior to Visit  Medication Sig Dispense Refill  . amLODipine (NORVASC) 5 MG tablet Take 1 tablet (5 mg total) by mouth daily. 90 tablet 3  . DENTA 5000 PLUS 1.1 % CREA dental cream Take 1 application by mouth at bedtime.     . famotidine (PEPCID) 40 MG tablet Take 1 tablet (40 mg total) by mouth at bedtime. 90 tablet 3  . levothyroxine (SYNTHROID) 50 MCG tablet TAKE 1 TABLET BY MOUTH DAILY BEFORE BREAKFAST 90 tablet 3  .  loratadine (CLARITIN) 10 MG tablet Take 10 mg by mouth daily as needed for allergies.    Marland Kitchen LORazepam (ATIVAN) 1 MG tablet TAKE 1/2 (ONE-HALF) TABLET BY MOUTH AT BEDTIME AS NEEDED 15 tablet 3  . meclizine (ANTIVERT) 25 MG tablet Take 1 tablet (25 mg total) by mouth 3 (three) times daily as needed for dizziness. 15 tablet 0  . metoprolol succinate (TOPROL-XL) 25 MG 24 hr tablet Take 0.5 tablets (12.5 mg total) by mouth daily. 45 tablet 1  . XARELTO 20 MG TABS tablet TAKE 1 TABLET (20 MG TOTAL) BY MOUTH DAILY WITH SUPPER. 90 tablet 2  .  flecainide (TAMBOCOR) 50 MG tablet Take 1 tablet (50 mg total) by mouth 2 (two) times daily. (Patient not taking: Reported on 10/28/2019) 60 tablet 3   No current facility-administered medications on file prior to visit.     Review of Systems  Constitutional: Positive for fatigue. Negative for activity change, appetite change, fever and unexpected weight change.  HENT: Negative for congestion, ear pain, rhinorrhea, sinus pressure and sore throat.   Eyes: Negative for pain, redness and visual disturbance.  Respiratory: Negative for cough, shortness of breath and wheezing.   Cardiovascular: Negative for chest pain and palpitations.  Gastrointestinal: Negative for abdominal pain, blood in stool, constipation and diarrhea.  Endocrine: Negative for polydipsia and polyuria.  Genitourinary: Negative for dysuria, frequency and urgency.  Musculoskeletal: Negative for arthralgias, back pain and myalgias.  Skin: Negative for pallor and rash.  Allergic/Immunologic: Negative for environmental allergies.  Neurological: Negative for dizziness, syncope and headaches.  Hematological: Negative for adenopathy. Does not bruise/bleed easily.  Psychiatric/Behavioral: Negative for confusion, decreased concentration, dysphoric mood, self-injury and suicidal ideas. The patient is nervous/anxious.        Objective:   Physical Exam Constitutional:      General: She is not in acute  distress.    Appearance: Normal appearance. She is well-developed and normal weight. She is not ill-appearing or diaphoretic.  HENT:     Head: Normocephalic and atraumatic.  Eyes:     Conjunctiva/sclera: Conjunctivae normal.     Pupils: Pupils are equal, round, and reactive to light.  Neck:     Thyroid: No thyromegaly.     Vascular: No carotid bruit or JVD.  Cardiovascular:     Rate and Rhythm: Normal rate and regular rhythm.     Heart sounds: Normal heart sounds. No gallop.   Pulmonary:     Effort: Pulmonary effort is normal. No respiratory distress.     Breath sounds: Normal breath sounds. No wheezing or rales.  Abdominal:     General: Bowel sounds are normal. There is no distension or abdominal bruit.     Palpations: Abdomen is soft. There is no mass.     Tenderness: There is no abdominal tenderness.  Musculoskeletal:     Cervical back: Normal range of motion and neck supple.     Right lower leg: No edema.     Left lower leg: No edema.  Lymphadenopathy:     Cervical: No cervical adenopathy.  Skin:    General: Skin is warm and dry.     Findings: No rash.  Neurological:     Mental Status: She is alert.     Sensory: No sensory deficit.     Coordination: Coordination normal.     Deep Tendon Reflexes: Reflexes are normal and symmetric. Reflexes normal.     Comments: No tremor  Psychiatric:        Mood and Affect: Mood is anxious. Affect is not tearful.        Behavior: Behavior normal.        Thought Content: Thought content normal.        Cognition and Memory: Cognition and memory normal.     Comments: Mildly anxious but pleasant  Good insight           Assessment & Plan:   Problem List Items Addressed This Visit      Cardiovascular and Mediastinum   Paroxysmal atrial fibrillation ([place])    Struggling with this Rev last ER and last cardiology notes Considering ablation and/or flecainide  Noted anxiety may be a trigger          Other   Generalized anxiety  disorder - Primary    Worsened by stress  Is now retired- and adjustment  Walking /good self care and no depression symptoms  Possible this anx triggers a fib and cardiology suggested ssri Reviewed stressors/ coping techniques/symptoms/ support sources/ tx options and side effects in detail today Will try zoloft low dose at 25 mg in the evening  Discussed expectations of SSRI medication including time to effectiveness and mechanism of action, also poss of side effects (early and late)- including mental fuzziness, weight or appetite change, nausea and poss of worse dep or anxiety (even suicidal thoughts)  Pt voiced understanding and will stop med and update if this occurs   Will f/u for re check in about a month  Enc her to keep exercising  If intolerable side eff inst to call /hold medicine       Relevant Medications   sertraline (ZOLOFT) 25 MG tablet

## 2019-10-28 NOTE — Assessment & Plan Note (Signed)
Struggling with this Rev last ER and last cardiology notes Considering ablation and/or flecainide  Noted anxiety may be a trigger

## 2019-10-28 NOTE — Assessment & Plan Note (Signed)
Worsened by stress  Is now retired- and adjustment  Walking /good self care and no depression symptoms  Possible this anx triggers a fib and cardiology suggested ssri Reviewed stressors/ coping techniques/symptoms/ support sources/ tx options and side effects in detail today Will try zoloft low dose at 25 mg in the evening  Discussed expectations of SSRI medication including time to effectiveness and mechanism of action, also poss of side effects (early and late)- including mental fuzziness, weight or appetite change, nausea and poss of worse dep or anxiety (even suicidal thoughts)  Pt voiced understanding and will stop med and update if this occurs   Will f/u for re check in about a month  Enc her to keep exercising  If intolerable side eff inst to call /hold medicine

## 2019-11-04 ENCOUNTER — Telehealth (HOSPITAL_COMMUNITY): Payer: Self-pay | Admitting: *Deleted

## 2019-11-04 NOTE — Telephone Encounter (Signed)
Patient called to report she has decided not to start flecainide as previously discussed at last Afib appt. She has been doing well and her PCP recently started her on zoloft and this has helped her a lot. She will call if issues arise before appt in December.

## 2019-11-05 ENCOUNTER — Telehealth: Payer: Self-pay | Admitting: Family Medicine

## 2019-11-05 MED ORDER — VENLAFAXINE HCL ER 37.5 MG PO CP24: 37.5000 mg | ORAL_CAPSULE | Freq: Every day | ORAL | 3 refills | Status: DC

## 2019-11-05 NOTE — Telephone Encounter (Signed)
Pt called  Pt stated she cannot take zoloft It is making her sick, diarrhea,nausea, anxiety is worse and not sleeping  Please advise what she needs to do  She wanted to know if she could take 1/2 of lorazepam  [facility]

## 2019-11-05 NOTE — Telephone Encounter (Signed)
Pt notified of Dr. Marliss Coots comments. She isn't sure if she wants to try another med given the side eff of zoloft. Pt said she will think about it and maybe start the med after her appt with PCP next month

## 2019-11-05 NOTE — Telephone Encounter (Signed)
Stop the zoloft   I sent in low dose effexor xr to try  Alert me of any problems - hope she tolerates this well   1/2 lorazepam is ok if needed while this is ramping up  Thanks

## 2019-11-08 ENCOUNTER — Ambulatory Visit (HOSPITAL_COMMUNITY): Payer: 59 | Admitting: Physician Assistant

## 2019-11-28 ENCOUNTER — Encounter: Payer: Self-pay | Admitting: Family Medicine

## 2019-11-28 ENCOUNTER — Ambulatory Visit (INDEPENDENT_AMBULATORY_CARE_PROVIDER_SITE_OTHER): Payer: Medicare HMO | Admitting: Family Medicine

## 2019-11-28 ENCOUNTER — Other Ambulatory Visit: Payer: Self-pay

## 2019-11-28 VITALS — BP 124/68 | HR 66 | Temp 97.0°F | Ht 66.0 in | Wt 153.2 lb

## 2019-11-28 DIAGNOSIS — I480 Paroxysmal atrial fibrillation: Secondary | ICD-10-CM | POA: Diagnosis not present

## 2019-11-28 DIAGNOSIS — R69 Illness, unspecified: Secondary | ICD-10-CM | POA: Diagnosis not present

## 2019-11-28 DIAGNOSIS — F411 Generalized anxiety disorder: Secondary | ICD-10-CM | POA: Diagnosis not present

## 2019-11-28 NOTE — Patient Instructions (Signed)
I placed a referral for counseling   The office will call you   Exercise  Socialize when you can  Take care of yourself   If symptoms worsen - please let me know   There are 2 apps I really like for meditation and mindfulness Calm  Headspace   There is a book call Keep Sharp    By Coral Spikes that is helpful re: keeping your brain healthy

## 2019-11-28 NOTE — Progress Notes (Signed)
Subjective:    Patient ID: Denise Grant, female    DOB: 1955-03-24, 64 y.o.   MRN: [id number]  This visit occurred during the SARS-CoV-2 public health emergency.  Safety protocols were in place, including screening questions prior to the visit, additional usage of staff PPE, and extensive cleaning of exam room while observing appropriate contact time as indicated for disinfecting solutions.   HPI Pt presents for f/u of anxiety disorder  Wt Readings from Last 3 Encounters:  11/28/19 153 lb 4 oz (69.5 kg)  10/28/19 158 lb (71.7 kg)  10/17/19 155 lb (70.3 kg)   24.74 kg/m  Last visit discussed anxiety symptoms in setting of a fib  Started low dose sertraline  She called saying she had diarrhea/nausea /insomnia and worse anxiety on it   We px effexor xr  Has prn lorazepam   She is not seeing a counselor -but now open to it   Stress Care giving- sister with dementia Has to help a brother in law  She does have help     The a fib is not bad  Did not tolerate flecainide  Has f/u in December Has discussed the ablation   Is able to exercise now  [facility] a mile a day     BP Readings from Last 3 Encounters:  11/28/19 124/68  10/28/19 128/76  10/17/19 132/60   Pulse Readings from Last 3 Encounters:  11/28/19 66  10/28/19 63  10/17/19 (!) 53    Patient Active Problem List   Diagnosis Date Noted  . Secondary hypercoagulable state ([place]) 07/05/2019  . Localized swelling, mass, or lump of lower extremity 05/13/2019  . Lumbar disc disease   . Bilateral bunions   . Paroxysmal atrial fibrillation ([place]) 09/27/2018  . Essential hypertension 01/01/2015  . Hypothyroidism 06/25/2014  . Migraine with vertigo 05/21/2014  . History of Chiari malformation 05/21/2014  . Atrophic vaginitis 05/01/2013  . Colon cancer screening 04/20/2012  . Encounter for routine gynecological examination 11/24/2010  . Routine general medical examination at a health care facility 11/18/2010  .  INSOMNIA 01/27/2010  . HEADACHE 03/31/2008  . Disorder of bone and cartilage 09/18/2007  . Generalized anxiety disorder 05/05/2006  . MITRAL VALVE PROLAPSE 05/05/2006  . ALLERGIC RHINITIS 05/05/2006  . FIBROCYSTIC BREAST DISEASE 05/05/2006  . SEBORRHEIC DERMATITIS 05/05/2006  . KERATOSIS, ACTINIC 05/05/2006  . NECK PAIN, CHRONIC 05/05/2006   Past Medical History:  Diagnosis Date  . Actinic keratosis   . Allergic rhinitis   . Anxiety   . Chiari malformation   . DDD (degenerative disc disease)   . Family history of other specified malignant neoplasm   . Headache(784.0)   . Insomnia, unspecified   . Lactose intolerance   . Mitral valve disorders(424.0)   . Osteopenia   . Seborrheic dermatitis, unspecified   . Vaginal cyst    stable   Past Surgical History:  Procedure Laterality Date  . AIR/FLUID EXCHANGE Right 07/25/2018   Procedure: Air/Fluid Exchange;  Surgeon: Jalene Mullet, MD;  Location: [facility];  Service: Ophthalmology;  Laterality: Right;  . bone spur removal  10/06   shoulder  . CATARACT EXTRACTION, BILATERAL Bilateral 2016  . [place] SURGERY  2005  . Chiari Malformation surgery  6/03  . EXCISION MORTON'S NEUROMA  2/04  . GAS INSERTION Right 07/25/2018   Procedure: Insertion Of Gas SF6;  Surgeon: Jalene Mullet, MD;  Location: [facility];  Service: Ophthalmology;  Laterality: Right;  . LUMBAR DISC SURGERY    .  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 TUBAL LIGATION     Social History   Tobacco Use  . Smoking status: Former Smoker    Quit date: 01/17/2001    Years since quitting: 18.8  . Smokeless tobacco: Never Used  Substance Use Topics  . Alcohol use: No    Alcohol/week: 0.0 standard drinks  . Drug use: No   Family History    Problem Relation Age of Onset  . Melanoma Sister   . Lung cancer Father   . Coronary artery disease Mother   . Heart failure Mother   . Hypertension Mother   . Anxiety disorder Son   . Lung cancer Brother        + smoker  . Breast cancer Neg Hx    Allergies  Allergen Reactions  . Buspar [Buspirone]     Felt funny  . Calcium     REACTION: cannot tolerate any calcium supplement - GI sympotms  . Fluticasone Propionate     REACTION: headache/irritation  . Hctz [Hydrochlorothiazide] Other (See Comments)    Eye redness and discomfort/ slt blurred vision   . Paroxetine     REACTION: abdominal pain and constipation  . Zoloft [Sertraline]     Diarrhea    Current Outpatient Medications on File Prior to Visit  Medication Sig Dispense Refill  . amLODipine (NORVASC) 5 MG tablet Take 1 tablet (5 mg total) by mouth daily. 90 tablet 3  . DENTA 5000 PLUS 1.1 % CREA dental cream Take 1 application by mouth at bedtime.     . famotidine (PEPCID) 40 MG tablet Take 1 tablet (40 mg total) by mouth at bedtime. (Patient taking differently: Take 40 mg by mouth daily as needed. ) 90 tablet 3  . levothyroxine (SYNTHROID) 50 MCG tablet TAKE 1 TABLET BY MOUTH DAILY BEFORE BREAKFAST 90 tablet 3  . loratadine (CLARITIN) 10 MG tablet Take 10 mg by mouth daily as needed for allergies.    Marland Kitchen LORazepam (ATIVAN) 1 MG tablet TAKE 1/2 (ONE-HALF) TABLET BY MOUTH AT BEDTIME AS NEEDED 15 tablet 3  . meclizine (ANTIVERT) 25 MG tablet Take 1 tablet (25 mg total) by mouth 3 (three) times daily as needed for dizziness. 15 tablet 0  . metoprolol succinate (TOPROL-XL) 25 MG 24 hr tablet Take 0.5 tablets (12.5 mg total) by mouth daily. 45 tablet 1  . XARELTO 20 MG TABS tablet TAKE 1 TABLET (20 MG TOTAL) BY MOUTH DAILY WITH SUPPER. 90 tablet 2   No current facility-administered medications on file prior to visit.    Review of Systems  Constitutional: Negative for activity change, appetite change, fatigue, fever and  unexpected weight change.  HENT: Negative for congestion, ear pain, rhinorrhea, sinus pressure and sore throat.   Eyes: Negative for pain, redness and visual disturbance.  Respiratory: Negative for cough, shortness of breath and wheezing.   Cardiovascular: Negative for chest pain and palpitations.  Gastrointestinal: Negative for abdominal pain, blood in stool, constipation and diarrhea.  Endocrine: Negative for polydipsia and polyuria.  Genitourinary: Negative for dysuria, frequency and urgency.  Musculoskeletal: Negative for arthralgias, back pain and myalgias.  Skin: Negative for pallor and rash.  Allergic/Immunologic: Negative for environmental allergies.  Neurological: Negative for dizziness, syncope and headaches.  Hematological: Negative for adenopathy. Does not  bruise/bleed easily.  Psychiatric/Behavioral: Negative for decreased concentration, dysphoric mood and self-injury. The patient is nervous/anxious.        Objective:   Physical Exam Constitutional:      General: She is not in acute distress.    Appearance: Normal appearance. She is normal weight. She is not ill-appearing or diaphoretic.  Eyes:     General: No scleral icterus.    Conjunctiva/sclera: Conjunctivae normal.     Pupils: Pupils are equal, round, and reactive to light.  Cardiovascular:     Rate and Rhythm: Normal rate and regular rhythm.     Heart sounds: Normal heart sounds.  Pulmonary:     Effort: Pulmonary effort is normal. No respiratory distress.  Musculoskeletal:     Right lower leg: No edema.     Left lower leg: No edema.  Neurological:     Mental Status: She is alert.     Coordination: Coordination normal.     Comments: No tremor  Psychiatric:        Mood and Affect: Mood normal.        Cognition and Memory: Cognition and memory normal.     Comments: Nl mood and affect today Candidly discusses stressors and worries            Assessment & Plan:   Problem List Items Addressed This  Visit      Cardiovascular and Mediastinum   Paroxysmal atrial fibrillation ([place])    Per pt still in and out of it  Did not tolerate the flecainide  Plans to f/u with cardiology Considering cardioversion  Rate controlled        Other   Generalized anxiety disorder - Primary    Pt did not tolerate sertraline and is afraid to try other medications  Wants to try counseling Ref made Disc need for self care/healthy lifestyle Enc to continue walking Disc meditation and mindfullness-may try one of the aps  Stress is stable Reviewed stressors/ coping techniques/symptoms/ support sources/ tx options and side effects in detail today       Relevant Orders   Ambulatory referral to Psychology

## 2019-11-28 NOTE — Assessment & Plan Note (Signed)
Per pt still in and out of it  Did not tolerate the flecainide  Plans to f/u with cardiology Considering cardioversion  Rate controlled

## 2019-11-28 NOTE — Assessment & Plan Note (Signed)
Pt did not tolerate sertraline and is afraid to try other medications  Wants to try counseling Ref made Disc need for self care/healthy lifestyle Enc to continue walking Disc meditation and mindfullness-may try one of the aps  Stress is stable Reviewed stressors/ coping techniques/symptoms/ support sources/ tx options and side effects in detail today

## 2019-12-18 ENCOUNTER — Other Ambulatory Visit: Payer: Self-pay | Admitting: Family Medicine

## 2019-12-19 NOTE — Telephone Encounter (Signed)
Name of [place] Name of [address] Last Fill or Written Date and Quantity: 08/09/19#15 tabs with 3 refills Last Office Visit and Type:discuss med 10/28/19 Next Office Visit and Type:CPE on 08/31/20

## 2020-01-02 NOTE — Progress Notes (Signed)
Primary Care Physician: Tower, Wynelle Fanny, MD Primary Cardiologist: none Primary Electrophysiologist: none Referring Physician: [facility]   Denise Grant is a 64 y.o. female with a history of Chiari malformation, HTN, hypothyroidism, MVP, generalized anxiety, and paroxysmal atrial fibrillation who presents for follow up in the [facility]. The patient was initially diagnosed with atrial fibrillation on 09/19/18 after presenting to the ER with symptoms of SOB, chest discomfort, and weakness. She was found to be in afib with RVR and underwent successful DCCV. There were no specific triggers that the patient could identify. She denies snoring or significant alcohol use. She had an echo 09/26/18 which showed preserved EF and a Zio patch 10/01/18 which showed no afib. Patient presented to the ED on 10/12/19 with symptoms of heart racing, nausea, and SOB. ECG showed afib with RVR. She underwent DCCV at that time.  On follow up today, patient reports that she has done well since her last visit. She has had rare and brief (< 1 minute) of palpitations but these have not been bothersome. She denies any bleeding issues on anticoagulation.   Today, she denies symptoms of chest pain, shortness of breath, orthopnea, PND, lower extremity edema, dizziness, presyncope, syncope, snoring, daytime somnolence, bleeding, or neurologic sequela. The patient is tolerating medications without difficulties and is otherwise without complaint today.    Atrial Fibrillation Risk Factors:  she does not have symptoms or diagnosis of sleep apnea. she does not have a history of rheumatic fever. she does not have a history of alcohol use. The patient does have a history of early familial atrial fibrillation or other arrhythmias. Sister has afib, brother has PPM.  she has a BMI of Body mass index is 24.69 kg/m.Marland Kitchen Filed Weights   01/03/20 0840  Weight: 69.4 kg    Family History  Problem Relation  Age of Onset  . Melanoma Sister   . Lung cancer Father   . Coronary artery disease Mother   . Heart failure Mother   . Hypertension Mother   . Anxiety disorder Son   . Lung cancer Brother        + smoker  . Breast cancer Neg Hx      Atrial Fibrillation Management history:  Previous antiarrhythmic drugs: none Previous cardioversions: 09/19/18, 10/12/19 Previous ablations: none CHADS2VASC score: 3 Anticoagulation history: Xarelto   Past Medical History:  Diagnosis Date  . Actinic keratosis   . Allergic rhinitis   . Anxiety   . Chiari malformation   . DDD (degenerative disc disease)   . Family history of other specified malignant neoplasm   . Headache(784.0)   . Insomnia, unspecified   . Lactose intolerance   . Mitral valve disorders(424.0)   . Osteopenia   . Seborrheic dermatitis, unspecified   . Vaginal cyst    stable   Past Surgical History:  Procedure Laterality Date  . AIR/FLUID EXCHANGE Right 07/25/2018   Procedure: Air/Fluid Exchange;  Surgeon: Jalene Mullet, MD;  Location: [facility];  Service: Ophthalmology;  Laterality: Right;  . bone spur removal  10/06   shoulder  . CATARACT EXTRACTION, BILATERAL Bilateral 2016  . [place] SURGERY  2005  . Chiari Malformation surgery  6/03  . EXCISION MORTON'S NEUROMA  2/04  . GAS INSERTION Right 07/25/2018   Procedure: Insertion Of Gas SF6;  Surgeon: Jalene Mullet, MD;  Location: [facility];  Service: Ophthalmology;  Laterality: Right;  . LUMBAR DISC SURGERY    . REPAIR OF  COMPLEX TRACTION RETINAL DETACHMENT Right 07/25/2018   Procedure: REPAIR OF COMPLEX TRACTION RETINAL DETACHMENT - 25GA VITRECTOMY, ENDOLASER, DRAINAGE OF SUBRETINAL FLUID;  Surgeon: Jalene Mullet, MD;  Location: [facility];  Service: Ophthalmology;  Laterality: Right;  . TUBAL LIGATION      Current Outpatient Medications  Medication Sig Dispense Refill  . amLODipine (NORVASC) 5 MG tablet Take 1 tablet (5 mg total) by mouth daily. 90 tablet 3  . DENTA 5000  PLUS 1.1 % CREA dental cream Take 1 application by mouth at bedtime.     . famotidine (PEPCID) 40 MG tablet Take 1 tablet (40 mg total) by mouth at bedtime. 90 tablet 3  . levothyroxine (SYNTHROID) 50 MCG tablet TAKE 1 TABLET BY MOUTH DAILY BEFORE BREAKFAST 90 tablet 3  . loratadine (CLARITIN) 10 MG tablet Take 10 mg by mouth daily as needed for allergies.    Marland Kitchen LORazepam (ATIVAN) 1 MG tablet TAKE 1/2 (ONE-HALF) TABLET BY MOUTH AT BEDTIME AS NEEDED 15 tablet 3  . meclizine (ANTIVERT) 25 MG tablet Take 1 tablet (25 mg total) by mouth 3 (three) times daily as needed for dizziness. 15 tablet 0  . metoprolol succinate (TOPROL-XL) 25 MG 24 hr tablet Take 0.5 tablets (12.5 mg total) by mouth daily. 45 tablet 1  . XARELTO 20 MG TABS tablet TAKE 1 TABLET (20 MG TOTAL) BY MOUTH DAILY WITH SUPPER. 90 tablet 2   No current facility-administered medications for this encounter.    Allergies  Allergen Reactions  . Buspar [Buspirone]     Felt funny  . Calcium     REACTION: cannot tolerate any calcium supplement - GI sympotms  . Fluticasone Propionate     REACTION: headache/irritation  . Hctz [Hydrochlorothiazide] Other (See Comments)    Eye redness and discomfort/ slt blurred vision   . Paroxetine     REACTION: abdominal pain and constipation  . Zoloft [Sertraline]     Diarrhea     Social History   Socioeconomic History  . Marital status: Widowed    Spouse name: Not on file  . Number of children: Not on file  . Years of education: Not on file  . Highest education level: Not on file  Occupational History  . Not on file  Tobacco Use  . Smoking status: Former Smoker    Quit date: 01/17/2001    Years since quitting: 18.9  . Smokeless tobacco: Never Used  Substance and Sexual Activity  . Alcohol use: No    Alcohol/week: 0.0 standard drinks  . Drug use: No  . Sexual activity: Not on file  Other Topics Concern  . Not on file  Social History Narrative   Walks daily for exercise       Married--husband diagnosed with stomach cancer (5/11)   Lost her husband 4/12 to cancer    Social Determinants of Health   Financial Resource Strain: Not on file  Food Insecurity: Not on file  Transportation Needs: Not on file  Physical Activity: Not on file  Stress: Not on file  Social Connections: Not on file  Intimate Partner Violence: Not on file     ROS- All systems are reviewed and negative except as per the HPI above.  Physical Exam: Vitals:   01/03/20 0840  BP: 126/62  Pulse: (!) 56  Weight: 69.4 kg  Height: 5\' 6"  (1.676 m)    GEN- The patient is well appearing, alert and oriented x 3 today.   HEENT-head normocephalic, atraumatic, sclera clear, conjunctiva pink,  hearing intact, trachea midline. Lungs- Clear to ausculation bilaterally, normal work of breathing Heart- Regular rate and rhythm, no murmurs, rubs or gallops  GI- soft, NT, ND, + BS Extremities- no clubbing, cyanosis, or edema MS- no significant deformity or atrophy Skin- no rash or lesion Psych- euthymic mood, full affect Neuro- strength and sensation are intact   Wt Readings from Last 3 Encounters:  01/03/20 69.4 kg  11/28/19 69.5 kg  10/28/19 71.7 kg    EKG today demonstrates SB HR 56, PR 160, QRS 78, QTc 389  Echo 09/26/18 1. The left ventricle has normal systolic function with an ejection fraction of 60-65%. The cavity size was normal. Left ventricular diastolic parameters were normal. No evidence of left ventricular regional wall motion abnormalities.  2. The right ventricle has normal systolic function. The cavity was normal. There is no increase in right ventricular wall thickness. Right ventricular systolic pressure is normal with an estimated pressure of 21.8 mmHg.  3. The pericardial effusion is circumferential.  4. Trivial pericardial effusion is present.  5. Mild thickening of the mitral valve leaflet. There is mild mitral annular calcification present.  6. Mild thickening of the aortic  valve. Aortic valve regurgitation is mild by color flow Doppler.  7. The aorta is normal unless otherwise noted.  Epic records are reviewed at length today   CHA2DS2-VASc Score = 3  The patient's score is based upon: CHF History: No HTN History: Yes Diabetes History: No Stroke History: No Vascular Disease History: No      ASSESSMENT AND PLAN: 1. Paroxysmal Atrial Fibrillation (ICD10:  I48.0) The patient's CHA2DS2-VASc score is 3, indicating a 3.2% annual risk of stroke.   Patient appears to be maintaining SR. Patient decided not to start flecainide. If she has prolonged episodes of afib in the future would start flecainide vs consider front line ablation.  Continue Toprol 12.5 mg daily Continue Xarelto 20 mg daily Check cbc/bmet today.  2. Secondary Hypercoagulable State (ICD10:  D68.69) The patient is at significant risk for stroke/thromboembolism based upon her CHA2DS2-VASc Score of 3.  Continue Rivaroxaban (Xarelto).   3. HTN Stable, no changes today.   Follow up in the AF clinic in 6 months.    [facility] [address] [place], [place] [address] [phone] 01/03/2020 9:08 AM

## 2020-01-03 ENCOUNTER — Other Ambulatory Visit: Payer: Self-pay

## 2020-01-03 ENCOUNTER — Ambulatory Visit (HOSPITAL_COMMUNITY)
Admission: RE | Admit: 2020-01-03 | Discharge: 2020-01-03 | Disposition: A | Discharge status: Home / Self Care | Payer: Medicare HMO | Source: Ambulatory Visit | Attending: Physician Assistant | Admitting: Physician Assistant

## 2020-01-03 ENCOUNTER — Encounter (HOSPITAL_COMMUNITY): Payer: Self-pay | Admitting: Physician Assistant

## 2020-01-03 VITALS — BP 126/62 | HR 56 | Ht 66.0 in | Wt 153.0 lb

## 2020-01-03 DIAGNOSIS — I341 Nonrheumatic mitral (valve) prolapse: Secondary | ICD-10-CM | POA: Insufficient documentation

## 2020-01-03 DIAGNOSIS — I480 Paroxysmal atrial fibrillation: Secondary | ICD-10-CM | POA: Insufficient documentation

## 2020-01-03 DIAGNOSIS — E039 Hypothyroidism, unspecified: Secondary | ICD-10-CM | POA: Diagnosis not present

## 2020-01-03 DIAGNOSIS — Z79899 Other long term (current) drug therapy: Secondary | ICD-10-CM | POA: Diagnosis not present

## 2020-01-03 DIAGNOSIS — Z7901 Long term (current) use of anticoagulants: Secondary | ICD-10-CM | POA: Diagnosis not present

## 2020-01-03 DIAGNOSIS — Z8249 Family history of ischemic heart disease and other diseases of the circulatory system: Secondary | ICD-10-CM | POA: Insufficient documentation

## 2020-01-03 DIAGNOSIS — F411 Generalized anxiety disorder: Secondary | ICD-10-CM | POA: Insufficient documentation

## 2020-01-03 DIAGNOSIS — D6869 Other thrombophilia: Secondary | ICD-10-CM | POA: Insufficient documentation

## 2020-01-03 DIAGNOSIS — I10 Essential (primary) hypertension: Secondary | ICD-10-CM | POA: Diagnosis not present

## 2020-01-03 DIAGNOSIS — Z87891 Personal history of nicotine dependence: Secondary | ICD-10-CM | POA: Diagnosis not present

## 2020-01-03 DIAGNOSIS — R69 Illness, unspecified: Secondary | ICD-10-CM | POA: Diagnosis not present

## 2020-01-03 LAB — CBC
HCT: 38.7 % (ref 36.0–46.0)
Hemoglobin: 13.3 g/dL (ref 12.0–15.0)
MCH: 31 pg (ref 26.0–34.0)
MCHC: 34.4 g/dL (ref 30.0–36.0)
MCV: 90.2 fL (ref 80.0–100.0)
Platelets: 224 10*3/uL (ref 150–400)
RBC: 4.29 MIL/uL (ref 3.87–5.11)
RDW: 13 % (ref 11.5–15.5)
WBC: 4.1 10*3/uL (ref 4.0–10.5)
nRBC: 0 % (ref 0.0–0.2)

## 2020-01-03 LAB — BASIC METABOLIC PANEL
Anion gap: 9 (ref 5–15)
BUN: 15 mg/dL (ref 8–23)
CO2: 26 mmol/L (ref 22–32)
Calcium: 9.4 mg/dL (ref 8.9–10.3)
Chloride: 103 mmol/L (ref 98–111)
Creatinine, Ser: 0.89 mg/dL (ref 0.44–1.00)
GFR, Estimated: >60 mL/min (ref >60)
Glucose, Bld: 98 mg/dL (ref 70–99)
Potassium: 4.4 mmol/L (ref 3.5–5.1)
Sodium: 138 mmol/L (ref 135–145)

## 2020-01-07 ENCOUNTER — Ambulatory Visit (INDEPENDENT_AMBULATORY_CARE_PROVIDER_SITE_OTHER): Payer: Medicare HMO | Admitting: Psychology

## 2020-01-07 DIAGNOSIS — R69 Illness, unspecified: Secondary | ICD-10-CM | POA: Diagnosis not present

## 2020-01-07 DIAGNOSIS — F418 Other specified anxiety disorders: Secondary | ICD-10-CM | POA: Diagnosis not present

## 2020-01-08 ENCOUNTER — Other Ambulatory Visit (HOSPITAL_COMMUNITY): Payer: Self-pay | Admitting: Physician Assistant

## 2020-01-28 ENCOUNTER — Ambulatory Visit (INDEPENDENT_AMBULATORY_CARE_PROVIDER_SITE_OTHER): Payer: Medicare HMO | Admitting: Psychology

## 2020-01-28 DIAGNOSIS — F418 Other specified anxiety disorders: Secondary | ICD-10-CM | POA: Diagnosis not present

## 2020-01-28 DIAGNOSIS — R69 Illness, unspecified: Secondary | ICD-10-CM | POA: Diagnosis not present

## 2020-02-11 ENCOUNTER — Ambulatory Visit (INDEPENDENT_AMBULATORY_CARE_PROVIDER_SITE_OTHER): Payer: Medicare HMO | Admitting: Psychology

## 2020-02-11 DIAGNOSIS — R69 Illness, unspecified: Secondary | ICD-10-CM | POA: Diagnosis not present

## 2020-02-11 DIAGNOSIS — F418 Other specified anxiety disorders: Secondary | ICD-10-CM | POA: Diagnosis not present

## 2020-02-14 DIAGNOSIS — M545 Low back pain, unspecified: Secondary | ICD-10-CM | POA: Diagnosis not present

## 2020-02-14 DIAGNOSIS — M5416 Radiculopathy, lumbar region: Secondary | ICD-10-CM | POA: Diagnosis not present

## 2020-02-19 ENCOUNTER — Other Ambulatory Visit: Payer: Self-pay | Admitting: Orthopedic Surgery

## 2020-02-19 DIAGNOSIS — M5416 Radiculopathy, lumbar region: Secondary | ICD-10-CM

## 2020-02-25 ENCOUNTER — Ambulatory Visit (INDEPENDENT_AMBULATORY_CARE_PROVIDER_SITE_OTHER): Payer: Medicare HMO | Admitting: Psychology

## 2020-02-25 DIAGNOSIS — F418 Other specified anxiety disorders: Secondary | ICD-10-CM | POA: Diagnosis not present

## 2020-02-25 DIAGNOSIS — R69 Illness, unspecified: Secondary | ICD-10-CM | POA: Diagnosis not present

## 2020-02-29 ENCOUNTER — Ambulatory Visit
Admission: RE | Admit: 2020-02-29 | Discharge: 2020-02-29 | Disposition: A | Discharge status: Home / Self Care | Payer: 59 | Source: Ambulatory Visit | Attending: Orthopedic Surgery | Admitting: Orthopedic Surgery

## 2020-02-29 ENCOUNTER — Other Ambulatory Visit: Payer: Self-pay

## 2020-02-29 DIAGNOSIS — M5416 Radiculopathy, lumbar region: Secondary | ICD-10-CM | POA: Insufficient documentation

## 2020-02-29 IMAGING — MR MR LUMBAR SPINE W/O CM
5 series · 31 of 48 positions shown · non-contrast
Comparison: [DATE]

CLINICAL DATA: Low back pain with left leg pain for 2 months

EXAM:
MRI LUMBAR SPINE WITHOUT CONTRAST
TECHNIQUE: Multiplanar, multisequence MR imaging of the lumbar spine was
performed. No intravenous contrast was administered.

[Series 5: T2 · sagittal · 4.0mm · 0.81mm/px · 6 of 17 slices shown (1 of 2)]
[im 1/17]
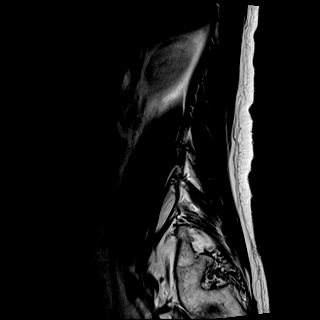
[im 4/17]
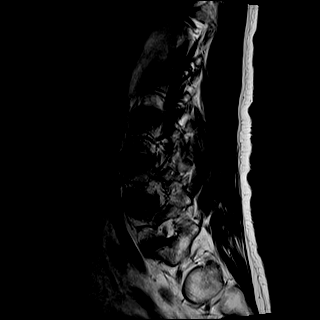
[im 7/17]
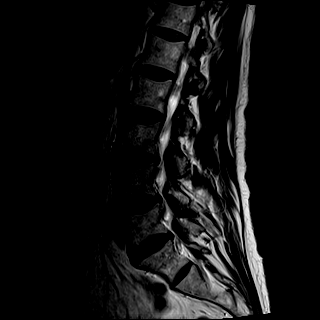
[im 10/17]
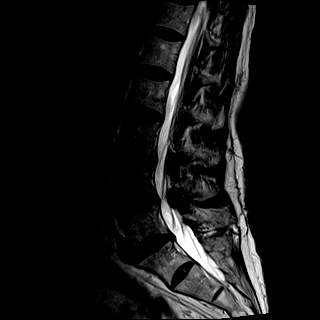
[im 13/17]
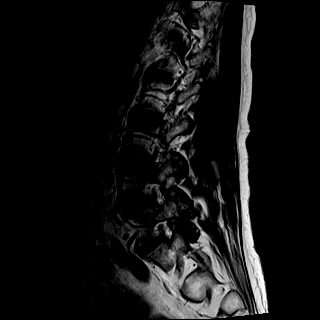
[im 17/17]
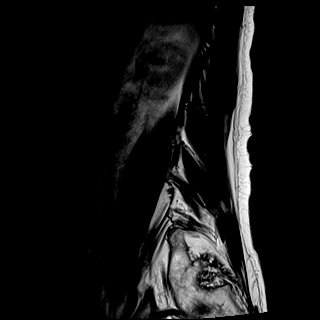

[Series 6: T1 · sagittal · 4.0mm · 0.81mm/px · 7 of 17 slices shown (1 of 2)]
[im 1/17]
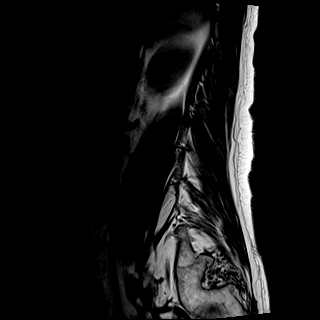
[im 3/17]
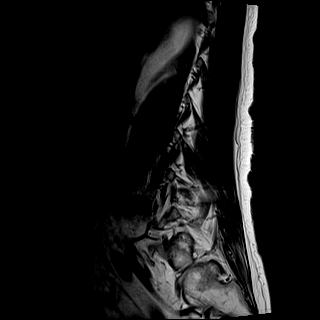
[im 6/17]
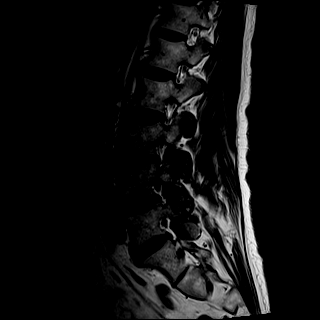
[im 9/17]
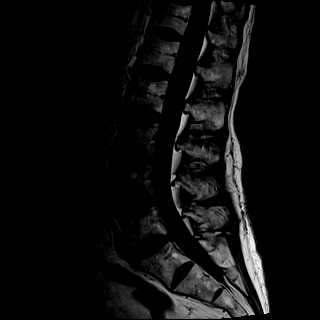
[im 11/17]
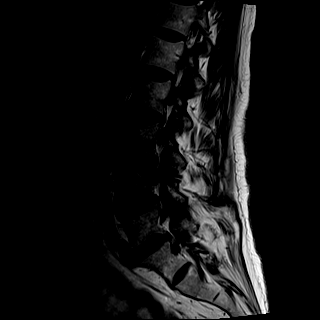
[im 14/17]
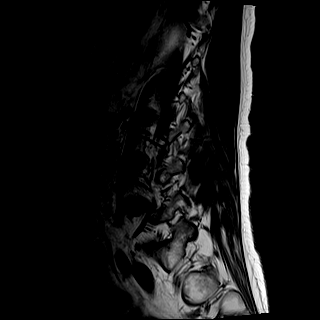
[im 17/17]
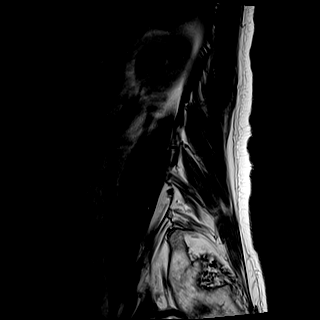

[Series 7: STIR · sagittal · 4.0mm · 0.41mm/px · 2 of 17 slices shown]
[im 1/17]
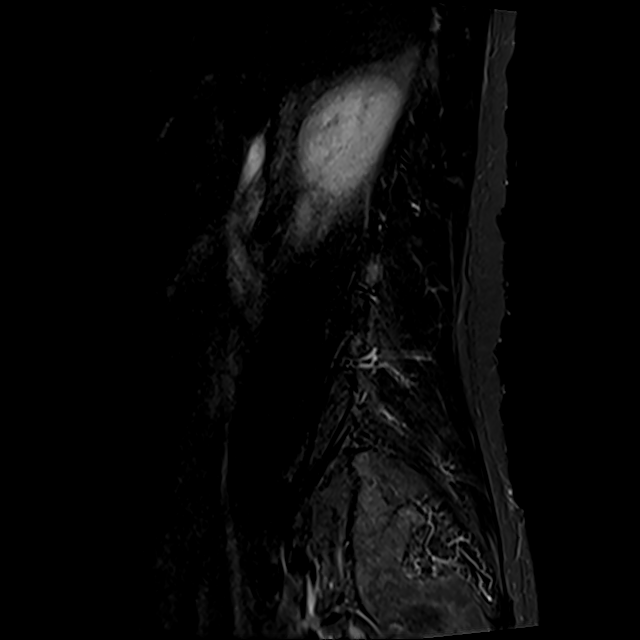
[im 3/17]
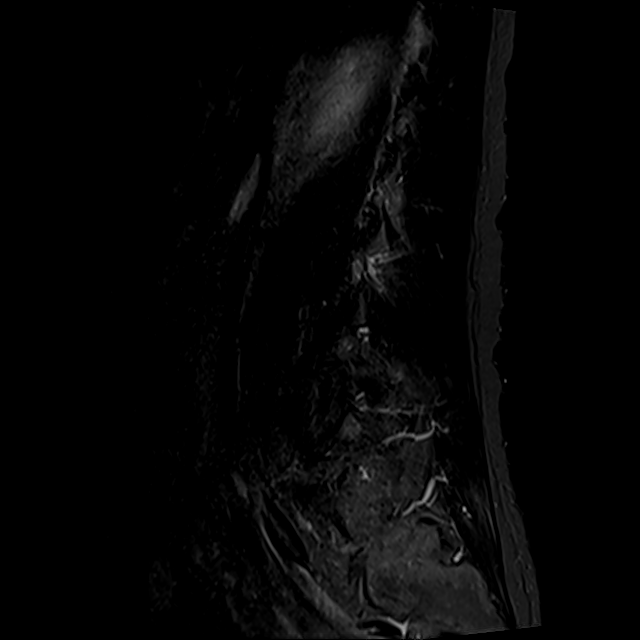

[Series 8: T2 · axial · 4.0mm · 0.78mm/px · z∈[-95,+113]mm · 8 of 36 slices shown (2 of 2)]
[im 1/36]
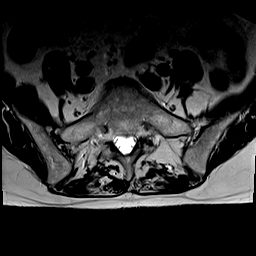
[im 6/36]
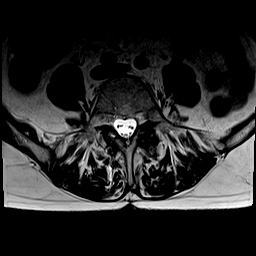
[im 11/36]
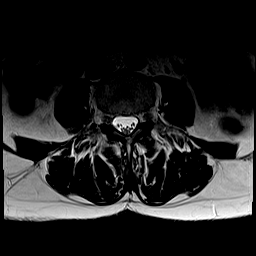
[im 17/36]
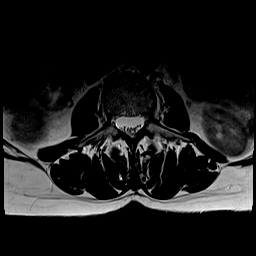
[im 19/36]
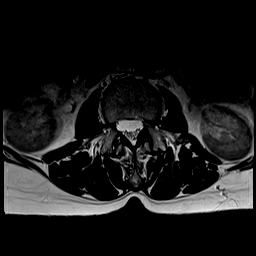
[im 25/36]
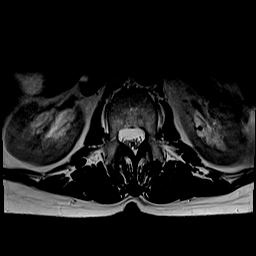
[im 30/36]
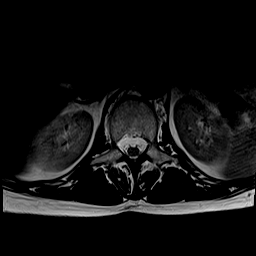
[im 36/36]
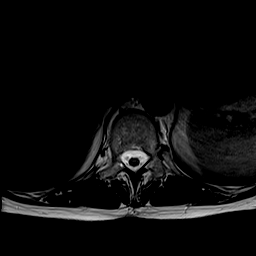

[Series 9: T1 · axial · 4.0mm · 0.39mm/px · z∈[-95,+113]mm · 8 of 36 slices shown (2 of 2)]
[im 1/36]
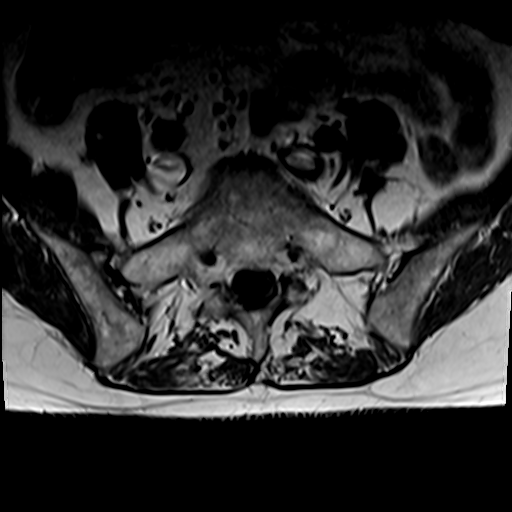
[im 6/36]
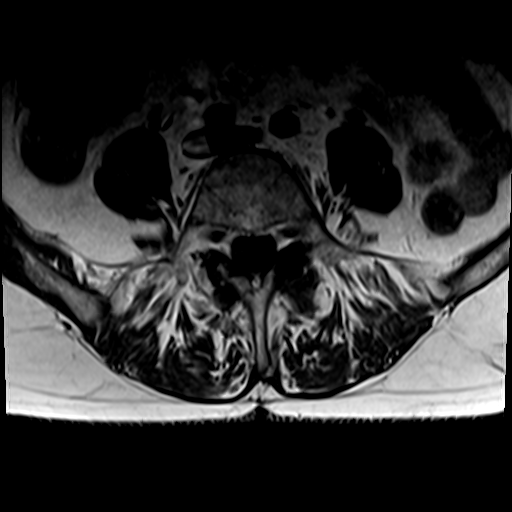
[im 11/36]
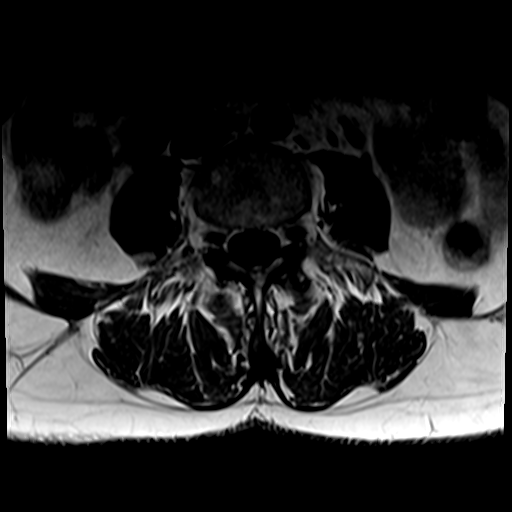
[im 17/36]
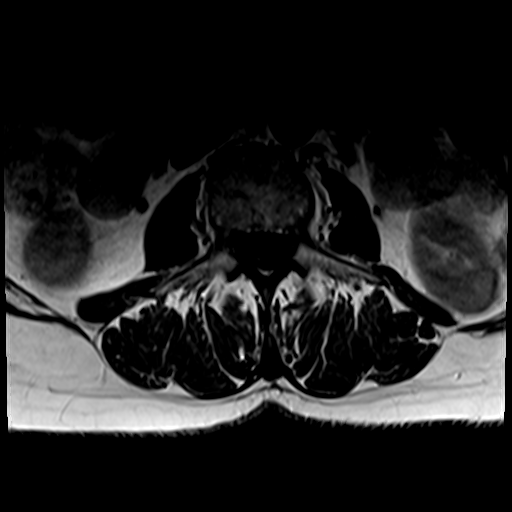
[im 19/36]
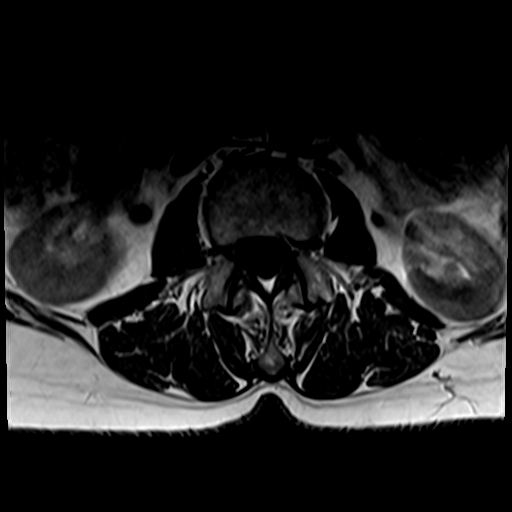
[im 25/36]
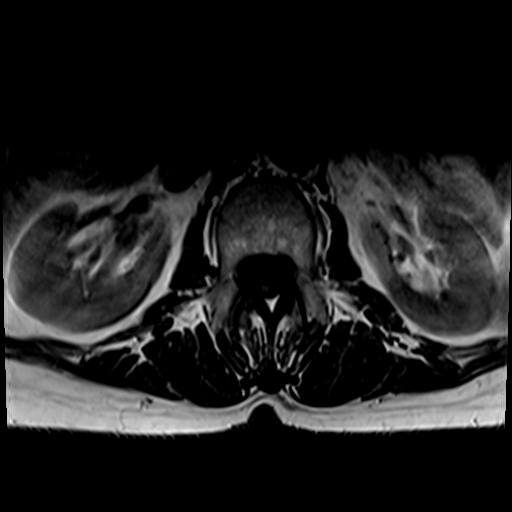
[im 30/36]
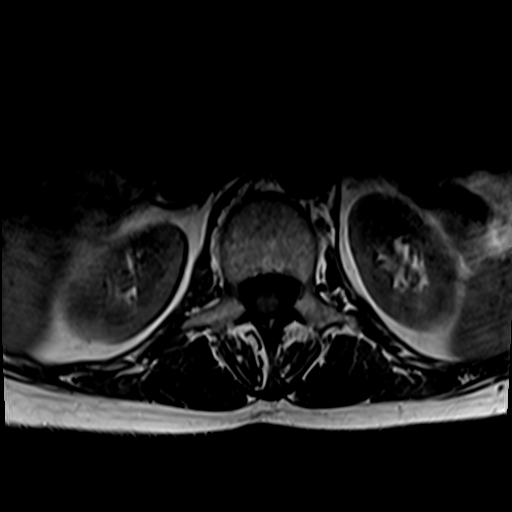
[im 36/36]
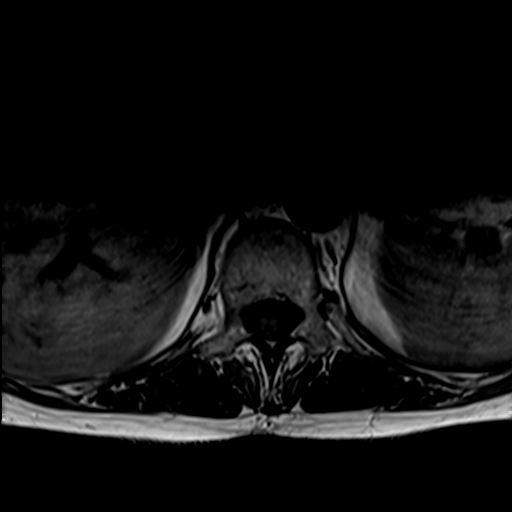

[31 of 48 positions shown; findings below may reference images not displayed]

FINDINGS: Segmentation: Transitional lumbosacral vertebra numbered S1 based on
the lowest visible ribs.

Alignment:  Grade 1 anterolisthesis at L4-5, new from prior

Vertebrae:  No fracture, evidence of discitis, or bone lesion.

Conus medullaris and cauda equina: Conus extends to the L2 level.
Conus and cauda equina appear normal.

Paraspinal and other soft tissues: Scarring in the lower posterior
soft tissues that is expected.

Disc levels:

T12- L1: Unremarkable.

L1-L2: Unremarkable.

L2-L3: Unremarkable.

L3-L4: Minor annulus bulging.  Mild degenerative facet spurring.

L4-L5: Degenerative facet spurring with mild anterolisthesis. Mild
annulus bulging.

L5-S1:Disc narrowing and bulging. Prior left-sided laminotomy
presumably for micro discectomy.
IMPRESSION: 1. No neural impingement or other specific cause of leg symptoms.
2. Lower lumbar facet osteoarthritis with mild progression from
[MP]. Mild anterolisthesis at L4-5 has developed since prior.
3. Prior decompression at L5-S1.  No recurrent stenosis.
4. Transitional S1 vertebra.

## 2020-03-05 ENCOUNTER — Other Ambulatory Visit: Payer: Self-pay

## 2020-03-05 ENCOUNTER — Ambulatory Visit: Payer: Medicare HMO | Admitting: Dermatology

## 2020-03-05 DIAGNOSIS — Z1283 Encounter for screening for malignant neoplasm of skin: Secondary | ICD-10-CM | POA: Diagnosis not present

## 2020-03-05 DIAGNOSIS — L820 Inflamed seborrheic keratosis: Secondary | ICD-10-CM | POA: Diagnosis not present

## 2020-03-05 DIAGNOSIS — B351 Tinea unguium: Secondary | ICD-10-CM | POA: Diagnosis not present

## 2020-03-05 DIAGNOSIS — L814 Other melanin hyperpigmentation: Secondary | ICD-10-CM | POA: Diagnosis not present

## 2020-03-05 DIAGNOSIS — D229 Melanocytic nevi, unspecified: Secondary | ICD-10-CM | POA: Diagnosis not present

## 2020-03-05 DIAGNOSIS — D1800 Hemangioma unspecified site: Secondary | ICD-10-CM | POA: Diagnosis not present

## 2020-03-05 DIAGNOSIS — L739 Follicular disorder, unspecified: Secondary | ICD-10-CM

## 2020-03-05 DIAGNOSIS — L308 Other specified dermatitis: Secondary | ICD-10-CM | POA: Diagnosis not present

## 2020-03-05 DIAGNOSIS — L578 Other skin changes due to chronic exposure to nonionizing radiation: Secondary | ICD-10-CM

## 2020-03-05 DIAGNOSIS — L821 Other seborrheic keratosis: Secondary | ICD-10-CM

## 2020-03-05 MED ORDER — MOMETASONE FUROATE 0.1 % EX OINT: TOPICAL_OINTMENT | Freq: Every day | CUTANEOUS | 1 refills | Status: DC

## 2020-03-05 MED ORDER — MUPIROCIN 2 % EX OINT: TOPICAL_OINTMENT | CUTANEOUS | 2 refills | Status: DC

## 2020-03-05 MED ORDER — TAVABOROLE 5 % EX SOLN: CUTANEOUS | 2 refills | Status: DC

## 2020-03-05 NOTE — Progress Notes (Signed)
Follow-Up Visit   Subjective  Denise Grant is a 65 y.o. female who presents for the following: Annual Exam. Pt c/o ingrown hair inside the R  Nostril area.  Check a itchy rash on the back.  The patient presents for Total-Body Skin Exam (TBSE) for skin cancer screening and mole check.  The following portions of the chart were reviewed this encounter and updated as appropriate:   Tobacco  Allergies  Meds  Problems  Med Hx  Surg Hx  Fam Hx     Review of Systems:  No other skin or systemic complaints except as noted in HPI or Assessment and Plan.  Objective  Well appearing patient in no apparent distress; mood and affect are within normal limits.  A full examination was performed including scalp, head, eyes, ears, nose, lips, neck, chest, axillae, abdomen, back, buttocks, bilateral upper extremities, bilateral lower extremities, hands, feet, fingers, toes, fingernails, and toenails. All findings within normal limits unless otherwise noted below.  Objective  Nose: folliculitis   Objective  Right Wrist - Anterior, R preauricular (2): Erythematous keratotic or waxy stuck-on papule or plaque.   Objective  Mid Back: Scaly erythematous papules and patches +/- dyspigmentation, lichenification, excoriations.   Objective  feet: Nail dystrophy great L toe   Assessment & Plan  Folliculitis - inside nasal mucosa Nose Common, nothing bad or dangerous, no cure can only control.  Likely will be recurrent. Start Mupirocin ointment apply to each nostril daily   Ordered Medications: mupirocin ointment (BACTROBAN) 2 %  Inflamed seborrheic keratosis (2) Right Wrist - Anterior, R preauricular  Destruction of lesion - Right Wrist - Anterior, R preauricular Complexity: simple   Destruction method: cryotherapy   Informed consent: discussed and consent obtained   Timeout:  patient name, date of birth, surgical site, and procedure verified Lesion destroyed using liquid nitrogen: Yes    Region frozen until ice ball extended beyond lesion: Yes   Outcome: patient tolerated procedure well with no complications   Post-procedure details: wound care instructions given    Skin cancer screening  Eczema Mid Back Atopic dermatitis (eczema) is a chronic, relapsing, pruritic condition that can significantly affect quality of life. It is often associated with allergic rhinitis and/or asthma and can require treatment with topical medications, phototherapy, or in severe cases a biologic medication called Dupixent in older children and adults.    Start Mometasone ointment apply to skin qd-bid prn  Ordered Medications: mometasone (ELOCON) 0.1 % ointment  Tinea unguium feet Chronic; persistent Start Kerydin apply to nails at bedtime   Ordered Medications: Tavaborole (KERYDIN) 5 % SOLN   Lentigines - Scattered tan macules - Discussed due to sun exposure - Benign, observe - Call for any changes  Seborrheic Keratoses - Stuck-on, waxy, tan-brown papules and plaques  - Discussed benign etiology and prognosis. - Observe - Call for any changes  Melanocytic Nevi - Tan-brown and/or pink-flesh-colored symmetric macules and papules - Benign appearing on exam today - Observation - Call clinic for new or changing moles - Recommend daily use of broad spectrum spf 30+ sunscreen to sun-exposed areas.   Hemangiomas - Red papules - Discussed benign nature - Observe - Call for any changes  Actinic Damage - Chronic, secondary to cumulative UV/sun exposure - diffuse scaly erythematous macules with underlying dyspigmentation - Recommend daily broad spectrum sunscreen SPF 30+ to sun-exposed areas, reapply every 2 hours as needed.  - Call for new or changing lesions.  Skin cancer screening performed today.  Return in about 1 year (around 03/05/2021) for TBSE.  IMarye Round, CMA, am acting as scribe for Sarina Ser, MD .  Documentation: I have reviewed the above  documentation for accuracy and completeness, and I agree with the above.  Sarina Ser, MD

## 2020-03-05 NOTE — Patient Instructions (Signed)
Topical steroids (such as triamcinolone, fluocinolone, fluocinonide, mometasone, clobetasol, halobetasol, betamethasone, hydrocortisone) can cause thinning and lightening of the skin if they are used for too long in the same area. Your physician has selected the right strength medicine for your problem and area affected on the body. Please use your medication only as directed by your physician to prevent side effects.    Cryotherapy Aftercare  . Wash gently with soap and water everyday.   Marland Kitchen Apply Vaseline and Band-Aid daily until healed. Marland Kitchen

## 2020-03-08 ENCOUNTER — Encounter: Payer: Self-pay | Admitting: Dermatology

## 2020-03-09 DIAGNOSIS — M545 Low back pain, unspecified: Secondary | ICD-10-CM | POA: Diagnosis not present

## 2020-03-10 ENCOUNTER — Ambulatory Visit: Payer: Medicare HMO | Admitting: Psychology

## 2020-03-11 DIAGNOSIS — H31092 Other chorioretinal scars, left eye: Secondary | ICD-10-CM | POA: Diagnosis not present

## 2020-03-11 DIAGNOSIS — H35342 Macular cyst, hole, or pseudohole, left eye: Secondary | ICD-10-CM | POA: Diagnosis not present

## 2020-03-11 DIAGNOSIS — H35413 Lattice degeneration of retina, bilateral: Secondary | ICD-10-CM | POA: Diagnosis not present

## 2020-03-11 DIAGNOSIS — Z961 Presence of intraocular lens: Secondary | ICD-10-CM | POA: Diagnosis not present

## 2020-03-11 DIAGNOSIS — H59811 Chorioretinal scars after surgery for detachment, right eye: Secondary | ICD-10-CM | POA: Diagnosis not present

## 2020-03-11 DIAGNOSIS — H338 Other retinal detachments: Secondary | ICD-10-CM | POA: Diagnosis not present

## 2020-03-11 DIAGNOSIS — H35373 Puckering of macula, bilateral: Secondary | ICD-10-CM | POA: Diagnosis not present

## 2020-03-23 DIAGNOSIS — M5451 Vertebrogenic low back pain: Secondary | ICD-10-CM | POA: Diagnosis not present

## 2020-03-26 DIAGNOSIS — M5451 Vertebrogenic low back pain: Secondary | ICD-10-CM | POA: Diagnosis not present

## 2020-03-31 DIAGNOSIS — M5451 Vertebrogenic low back pain: Secondary | ICD-10-CM | POA: Diagnosis not present

## 2020-04-07 DIAGNOSIS — M5451 Vertebrogenic low back pain: Secondary | ICD-10-CM | POA: Diagnosis not present

## 2020-04-10 DIAGNOSIS — M5451 Vertebrogenic low back pain: Secondary | ICD-10-CM | POA: Diagnosis not present

## 2020-04-14 DIAGNOSIS — M5451 Vertebrogenic low back pain: Secondary | ICD-10-CM | POA: Diagnosis not present

## 2020-04-17 DIAGNOSIS — M5451 Vertebrogenic low back pain: Secondary | ICD-10-CM | POA: Diagnosis not present

## 2020-04-20 DIAGNOSIS — M5451 Vertebrogenic low back pain: Secondary | ICD-10-CM | POA: Diagnosis not present

## 2020-04-21 ENCOUNTER — Other Ambulatory Visit: Payer: Self-pay | Admitting: Family Medicine

## 2020-04-22 NOTE — Telephone Encounter (Signed)
Name of [place] Name of [address] Last Fill or Written Date and Quantity:12/19/19#15 tabs with 3 refills Last Office Visit and Type:discuss med 10/28/19 Next Office Visit and Type:CPE on 08/31/20

## 2020-06-16 ENCOUNTER — Other Ambulatory Visit (HOSPITAL_COMMUNITY): Payer: Self-pay | Admitting: *Deleted

## 2020-06-16 MED ORDER — RIVAROXABAN 20 MG PO TABS
20.0000 mg | ORAL_TABLET | Freq: Every day | ORAL | 2 refills | Status: DC
Start: 2020-06-16 — End: 2021-02-17

## 2020-07-03 ENCOUNTER — Ambulatory Visit (HOSPITAL_COMMUNITY): Payer: Medicare HMO | Admitting: Physician Assistant

## 2020-07-07 ENCOUNTER — Other Ambulatory Visit: Payer: Self-pay | Admitting: Family Medicine

## 2020-07-07 DIAGNOSIS — Z1231 Encounter for screening mammogram for malignant neoplasm of breast: Secondary | ICD-10-CM

## 2020-07-07 NOTE — Progress Notes (Signed)
Primary Care Physician: Tower, Wynelle Fanny, MD Primary Cardiologist: none Primary Electrophysiologist: none Referring Physician: [facility]   Denise Grant is a 65 y.o. female with a history of Chiari malformation, HTN, hypothyroidism, MVP, generalized anxiety, and paroxysmal atrial fibrillation who presents for follow up in the [facility]. The patient was initially diagnosed with atrial fibrillation on 09/19/18 after presenting to the ER with symptoms of SOB, chest discomfort, and weakness. She was found to be in afib with RVR and underwent successful DCCV. There were no specific triggers that the patient could identify. She denies snoring or significant alcohol use. She had an echo 09/26/18 which showed preserved EF and a Zio patch 10/01/18 which showed no afib. Patient presented to the ED on 10/12/19 with symptoms of heart racing, nausea, and SOB. ECG showed afib with RVR. She underwent DCCV at that time.  On follow up today, patient reports that she has done well since her last visit. She denies any sustained heart racing symptoms. She rarely has a "flutter" sensation which will last 1-2 seconds. She denies any bleeding issues on anticoagulation.   Today, she denies symptoms of chest pain, shortness of breath, orthopnea, PND, lower extremity edema, dizziness, presyncope, syncope, snoring, daytime somnolence, bleeding, or neurologic sequela. The patient is tolerating medications without difficulties and is otherwise without complaint today.    Atrial Fibrillation Risk Factors:  she does not have symptoms or diagnosis of sleep apnea. she does not have a history of rheumatic fever. she does not have a history of alcohol use. The patient does have a history of early familial atrial fibrillation or other arrhythmias. Sister has afib, brother has PPM.  she has a BMI of Body mass index is 24.44 kg/m.Marland Kitchen Filed Weights   07/08/20 0938  Weight: 68.7 kg     Family  History  Problem Relation Age of Onset   Melanoma Sister    Lung cancer Father    Coronary artery disease Mother    Heart failure Mother    Hypertension Mother    Anxiety disorder Son    Lung cancer Brother        + smoker   Breast cancer Neg Hx      Atrial Fibrillation Management history:  Previous antiarrhythmic drugs: none Previous cardioversions: 09/19/18, 10/12/19 Previous ablations: none CHADS2VASC score: 3 Anticoagulation history: Xarelto   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.0)    Osteopenia    Seborrheic dermatitis, unspecified    Vaginal cyst    stable   Past Surgical History:  Procedure Laterality Date   AIR/FLUID EXCHANGE Right 07/25/2018   Procedure: Air/Fluid Exchange;  Surgeon: Jalene Mullet, MD;  Location: [place];  Service: Ophthalmology;  Laterality: Right;   bone spur removal  10/06   shoulder   CATARACT EXTRACTION, BILATERAL Bilateral 2016   CERVICAL Oil Trough SURGERY  2005   Chiari Malformation surgery  6/03   EXCISION MORTON'S NEUROMA  2/04   GAS INSERTION Right 07/25/2018   Procedure: Insertion Of Gas SF6;  Surgeon: Jalene Mullet, MD;  Location: [place];  Service: Ophthalmology;  Laterality: Right;   LUMBAR DISC SURGERY  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 90 tablet 3   DENTA 5000 PLUS 1.1  % CREA dental cream Take 1 application by mouth at bedtime.      famotidine (PEPCID) 40 MG tablet Take 1 tablet (40 mg total) by mouth at bedtime. 90 tablet 3   levothyroxine (SYNTHROID) 50 MCG tablet TAKE 1 TABLET BY MOUTH DAILY BEFORE BREAKFAST 90 tablet 3   loratadine (CLARITIN) 10 MG tablet Take 10 mg by mouth daily as needed for allergies.     LORazepam (ATIVAN) 1 MG tablet TAKE 1/2 (ONE-HALF) TABLET BY MOUTH AT BEDTIME AS NEEDED 15 tablet 3   meclizine (ANTIVERT) 25 MG tablet Take 1 tablet (25 mg total) by mouth 3 (three) times daily as needed for dizziness. 15 tablet 0   metoprolol succinate (TOPROL-XL) 25 MG 24 hr tablet Take 0.5 tablets (12.5 mg total) by mouth daily. 45 tablet 1   mometasone (ELOCON) 0.1 % ointment Apply topically daily. Apply to itchy areas on the back bid prn 45 g 1   mupirocin ointment (BACTROBAN) 2 % Apply to each nostril area daily 22 g 2   rivaroxaban (XARELTO) 20 MG TABS tablet Take 1 tablet (20 mg total) by mouth daily with supper. 90 tablet 2   Tavaborole (KERYDIN) 5 % SOLN Apply to toenails at bedtme 4 mL 2   No current facility-administered medications for this encounter.    Allergies  Allergen Reactions   Buspar [Buspirone]     Felt funny   Calcium     REACTION: cannot tolerate any calcium supplement - GI sympotms   Fluticasone Propionate     REACTION: headache/irritation   Hctz [Hydrochlorothiazide] Other (See Comments)    Eye redness and discomfort/ slt blurred vision    Paroxetine     REACTION: abdominal pain and constipation   Zoloft [Sertraline]     Diarrhea     Social History   Socioeconomic History   Marital status: Widowed    Spouse name: Not on file   Number of children: Not on file   Years of education: Not on file   Highest education level: Not on file  Occupational History   Not on file  Tobacco Use   Smoking status: Former    Pack years: 0.00    Types: Cigarettes    Quit date: 01/17/2001    Years since quitting: 19.4    Smokeless tobacco: Never  Substance and Sexual Activity   Alcohol use: No    Alcohol/week: 0.0 standard drinks   Drug use: No   Sexual activity: Not on file  Other Topics Concern   Not on file  Social History Narrative   Walks daily for exercise      Married--husband diagnosed with stomach cancer (5/11)   Lost her husband 4/12 to cancer    Social Determinants of Health   Financial Resource Strain: Not on file  Food Insecurity: Not on file  Transportation Needs: Not on file  Physical Activity: Not on file  Stress: Not on file  Social Connections: Not on file  Intimate Partner Violence: Not on file  ROS- All systems are reviewed and negative except as per the HPI above.  Physical Exam: Vitals:   07/08/20 0938  BP: 128/70  Pulse: 72  Weight: 68.7 kg  Height: 5\' 6"  (1.676 m)     GEN- The patient is a well appearing female, alert and oriented x 3 today.   HEENT-head normocephalic, atraumatic, sclera clear, conjunctiva pink, hearing intact, trachea midline. Lungs- Clear to ausculation bilaterally, normal work of breathing Heart- Regular rate and rhythm, no murmurs, rubs or gallops  GI- soft, NT, ND, + BS Extremities- no clubbing, cyanosis, or edema MS- no significant deformity or atrophy Skin- no rash or lesion Psych- euthymic mood, full affect Neuro- strength and sensation are intact   Wt Readings from Last 3 Encounters:  07/08/20 68.7 kg  01/03/20 69.4 kg  11/28/19 69.5 kg    EKG today demonstrates  SR Vent rate 72 PR 164 QRS 70 Qtc 405  Echo 09/26/18 1. The left ventricle has normal systolic function with an ejection fraction of 60-65%. The cavity size was normal. Left ventricular diastolic parameters were normal. No evidence of left ventricular regional wall motion abnormalities.  2. The right ventricle has normal systolic function. The cavity was normal. There is no increase in right ventricular wall thickness. Right ventricular systolic pressure is  normal with an estimated pressure of 21.8 mmHg.  3. The pericardial effusion is circumferential.  4. Trivial pericardial effusion is present.  5. Mild thickening of the mitral valve leaflet. There is mild mitral annular calcification present.  6. Mild thickening of the aortic valve. Aortic valve regurgitation is mild by color flow Doppler.  7. The aorta is normal unless otherwise noted.  Epic records are reviewed at length today   CHA2DS2-VASc Score = 3  The patient's score is based upon: CHF History: No HTN History: Yes Diabetes History: No Stroke History: No Vascular Disease History: No      ASSESSMENT AND PLAN: 1. Paroxysmal Atrial Fibrillation (ICD10:  I48.0) The patient's CHA2DS2-VASc score is 3, indicating a 3.2% annual risk of stroke.   Patient appears to be maintaining SR. She has a Armed forces logistics/support/administrative officer watch for home monitoring.  Continue Toprol 12.5 mg daily Continue Xarelto 20 mg daily  2. Secondary Hypercoagulable State (ICD10:  D68.69) The patient is at significant risk for stroke/thromboembolism based upon her CHA2DS2-VASc Score of 3.  Continue Rivaroxaban (Xarelto).   3. HTN Stable, no changes today.   Follow up in the [facility] clinic in 6 months.    [facility] [address] [place], [place] [address] [phone] 07/08/2020 9:44 AM

## 2020-07-08 ENCOUNTER — Other Ambulatory Visit: Payer: Self-pay

## 2020-07-08 ENCOUNTER — Encounter (HOSPITAL_COMMUNITY): Payer: Self-pay | Admitting: Physician Assistant

## 2020-07-08 ENCOUNTER — Ambulatory Visit (HOSPITAL_COMMUNITY)
Admission: RE | Admit: 2020-07-08 | Discharge: 2020-07-08 | Disposition: A | Discharge status: Home / Self Care | Payer: Medicare HMO | Source: Ambulatory Visit | Attending: Physician Assistant | Admitting: Physician Assistant

## 2020-07-08 VITALS — BP 128/70 | HR 72 | Ht 66.0 in | Wt 151.4 lb

## 2020-07-08 DIAGNOSIS — D6869 Other thrombophilia: Secondary | ICD-10-CM

## 2020-07-08 DIAGNOSIS — Z8249 Family history of ischemic heart disease and other diseases of the circulatory system: Secondary | ICD-10-CM | POA: Diagnosis not present

## 2020-07-08 DIAGNOSIS — Z87891 Personal history of nicotine dependence: Secondary | ICD-10-CM | POA: Insufficient documentation

## 2020-07-08 DIAGNOSIS — Z882 Allergy status to sulfonamides status: Secondary | ICD-10-CM | POA: Insufficient documentation

## 2020-07-08 DIAGNOSIS — Z7901 Long term (current) use of anticoagulants: Secondary | ICD-10-CM | POA: Insufficient documentation

## 2020-07-08 DIAGNOSIS — I480 Paroxysmal atrial fibrillation: Secondary | ICD-10-CM | POA: Diagnosis not present

## 2020-07-08 DIAGNOSIS — I10 Essential (primary) hypertension: Secondary | ICD-10-CM | POA: Insufficient documentation

## 2020-07-08 MED ORDER — METOPROLOL SUCCINATE ER 25 MG PO TB24
12.5000 mg | ORAL_TABLET | Freq: Every day | ORAL | 3 refills | Status: DC
Start: 2020-07-08 — End: 2021-05-24

## 2020-07-08 NOTE — Addendum Note (Signed)
Encounter addended by: Hinda Kehr, CMA on: 07/08/2020 11:07 AM  Actions taken: Order list changed, Pharmacy for encounter modified

## 2020-07-14 DIAGNOSIS — M545 Low back pain, unspecified: Secondary | ICD-10-CM | POA: Diagnosis not present

## 2020-07-14 DIAGNOSIS — M25552 Pain in left hip: Secondary | ICD-10-CM | POA: Diagnosis not present

## 2020-07-24 DIAGNOSIS — M67911 Unspecified disorder of synovium and tendon, right shoulder: Secondary | ICD-10-CM | POA: Diagnosis not present

## 2020-07-24 DIAGNOSIS — M67912 Unspecified disorder of synovium and tendon, left shoulder: Secondary | ICD-10-CM | POA: Diagnosis not present

## 2020-08-11 DIAGNOSIS — M542 Cervicalgia: Secondary | ICD-10-CM | POA: Diagnosis not present

## 2020-08-11 DIAGNOSIS — M546 Pain in thoracic spine: Secondary | ICD-10-CM | POA: Diagnosis not present

## 2020-08-14 DIAGNOSIS — M542 Cervicalgia: Secondary | ICD-10-CM | POA: Diagnosis not present

## 2020-08-14 DIAGNOSIS — M546 Pain in thoracic spine: Secondary | ICD-10-CM | POA: Diagnosis not present

## 2020-08-18 DIAGNOSIS — M542 Cervicalgia: Secondary | ICD-10-CM | POA: Diagnosis not present

## 2020-08-19 ENCOUNTER — Encounter: Payer: Self-pay | Admitting: Neurology

## 2020-08-23 ENCOUNTER — Telehealth: Payer: Self-pay | Admitting: Family Medicine

## 2020-08-23 DIAGNOSIS — I10 Essential (primary) hypertension: Secondary | ICD-10-CM

## 2020-08-23 DIAGNOSIS — E039 Hypothyroidism, unspecified: Secondary | ICD-10-CM

## 2020-08-23 NOTE — Telephone Encounter (Signed)
-----   Message from Cloyd Stagers, RT sent at 08/10/2020  9:24 AM EDT ----- Regarding: Lab Orders for Monday 8.8.2022 Please place lab orders for Monday 8.8.2022, office visit for physical on Monday 8.15.2022 Thank you, Dyke Maes RT(R)

## 2020-08-24 ENCOUNTER — Other Ambulatory Visit: Payer: Self-pay

## 2020-08-24 ENCOUNTER — Other Ambulatory Visit (INDEPENDENT_AMBULATORY_CARE_PROVIDER_SITE_OTHER): Payer: Medicare HMO

## 2020-08-24 DIAGNOSIS — I10 Essential (primary) hypertension: Secondary | ICD-10-CM

## 2020-08-24 DIAGNOSIS — E039 Hypothyroidism, unspecified: Secondary | ICD-10-CM | POA: Diagnosis not present

## 2020-08-24 LAB — CBC WITH DIFFERENTIAL/PLATELET
Basophils Absolute: 0 10*3/uL (ref 0.0–0.1)
Basophils Relative: 0.4 % (ref 0.0–3.0)
Eosinophils Absolute: 0 10*3/uL (ref 0.0–0.7)
Eosinophils Relative: 0.3 % (ref 0.0–5.0)
HCT: 39.2 % (ref 36.0–46.0)
Hemoglobin: 13.1 g/dL (ref 12.0–15.0)
Lymphocytes Relative: 20.6 % (ref 12.0–46.0)
Lymphs Abs: 1.7 10*3/uL (ref 0.7–4.0)
MCHC: 33.4 g/dL (ref 30.0–36.0)
MCV: 86.7 fl (ref 78.0–100.0)
Monocytes Absolute: 0.5 10*3/uL (ref 0.1–1.0)
Monocytes Relative: 6.2 % (ref 3.0–12.0)
Neutro Abs: 6.1 10*3/uL (ref 1.4–7.7)
Neutrophils Relative %: 72.5 % (ref 43.0–77.0)
Platelets: 345 10*3/uL (ref 150.0–400.0)
RBC: 4.52 Mil/uL (ref 3.87–5.11)
RDW: 13.6 % (ref 11.5–15.5)
WBC: 8.3 10*3/uL (ref 4.0–10.5)

## 2020-08-24 LAB — LIPID PANEL
Cholesterol: 155 mg/dL (ref 0–200)
HDL: 54.7 mg/dL (ref >39.00)
LDL Cholesterol: 83 mg/dL (ref 0–99)
NonHDL: 99.97
Total CHOL/HDL Ratio: 3
Triglycerides: 85 mg/dL (ref 0.0–149.0)
VLDL: 17 mg/dL (ref 0.0–40.0)

## 2020-08-24 LAB — COMPREHENSIVE METABOLIC PANEL
ALT: 17 U/L (ref 0–35)
AST: 15 U/L (ref 0–37)
Albumin: 3.8 g/dL (ref 3.5–5.2)
Alkaline Phosphatase: 58 U/L (ref 39–117)
BUN: 16 mg/dL (ref 6–23)
CO2: 30 mEq/L (ref 19–32)
Calcium: 9.4 mg/dL (ref 8.4–10.5)
Chloride: 100 mEq/L (ref 96–112)
Creatinine, Ser: 0.78 mg/dL (ref 0.40–1.20)
GFR: 79.13 mL/min (ref >60.00)
Glucose, Bld: 81 mg/dL (ref 70–99)
Potassium: 4.7 mEq/L (ref 3.5–5.1)
Sodium: 138 mEq/L (ref 135–145)
Total Bilirubin: 0.5 mg/dL (ref 0.2–1.2)
Total Protein: 6.3 g/dL (ref 6.0–8.3)

## 2020-08-24 LAB — TSH: TSH: 0.53 u[IU]/mL (ref 0.35–5.50)

## 2020-08-25 ENCOUNTER — Ambulatory Visit (INDEPENDENT_AMBULATORY_CARE_PROVIDER_SITE_OTHER): Payer: Medicare HMO | Admitting: Family Medicine

## 2020-08-25 ENCOUNTER — Encounter: Payer: Self-pay | Admitting: Family Medicine

## 2020-08-25 VITALS — BP 146/72 | HR 76 | Temp 98.4°F | Ht 66.0 in | Wt 149.3 lb

## 2020-08-25 DIAGNOSIS — R5382 Chronic fatigue, unspecified: Secondary | ICD-10-CM

## 2020-08-25 DIAGNOSIS — M2550 Pain in unspecified joint: Secondary | ICD-10-CM | POA: Diagnosis not present

## 2020-08-25 DIAGNOSIS — M791 Myalgia, unspecified site: Secondary | ICD-10-CM | POA: Diagnosis not present

## 2020-08-25 LAB — C-REACTIVE PROTEIN: CRP: <1 mg/dL (ref 0.5–20.0)

## 2020-08-25 LAB — CK: Total CK: 39 U/L (ref 7–177)

## 2020-08-25 LAB — SEDIMENTATION RATE: Sed Rate: 16 mm/hr (ref 0–30)

## 2020-08-25 NOTE — Patient Instructions (Signed)
Labs today for auto immune pain syndromes  Will make a plan once it comes back  Take care of yourself   Eat healthy  Take tylenol as needed  Voltaren gel is ok as needed Also heat Denise Grant

## 2020-08-25 NOTE — Assessment & Plan Note (Signed)
Joint and muscle pain /myofasical Worst in upper body and shoulder girdle  Some trap tenderness on exam  No tenderness over any trigger points however Started in June with stressful event Disc differential incl PMR, OA, RA and auto imm joint dz and less likely lyme  Of note -immediately resolved with prednisone  Labs today  Plan to follow Disc use of acetaminophen and voltaren gel while waiting for results

## 2020-08-25 NOTE — Assessment & Plan Note (Signed)
Worse in the setting of all over body pain  Reassuring labs yesterday  Auto immune lab today and lyme ab screen

## 2020-08-25 NOTE — Progress Notes (Signed)
Subjective:    Patient ID: Denise Grant, female    DOB: 02/24/1955, 65 y.o.   MRN: [id number]  This visit occurred during the SARS-CoV-2 public health emergency.  Safety protocols were in place, including screening questions prior to the visit, additional usage of staff PPE, and extensive cleaning of exam room while observing appropriate contact time as indicated for disinfecting solutions.   HPI Pt presents for pain all over   Wt Readings from Last 3 Encounters:  08/25/20 149 lb 5 oz (67.7 kg)  07/08/20 151 lb 6.4 oz (68.7 kg)  01/03/20 153 lb (69.4 kg)   24.10 kg/m  Pain all over  When she takes prednisone from ortho it goes away and then it comes back    This bout started after nephew was killed - in June  One arm, then across and then back and then in legs  ? Stress related    Under care of orthopedics  Has had MRI of neck and spine- was ok  Has had shots in her shoulders   Pain is worst in her arms - entire arm-both  Hands are ok  No joint swelling or redness   No rashes  No tick bites that she knows of  No fever   No auto immune dz in family   Pain is both sharp and dull  Constant No numbness  Occ back of calves burn   Mornings are bad-stiffness Ice and heat and hot showers help   Lab Results  Component Value Date   TSH 0.53 08/24/2020   Lab Results  Component Value Date   WBC 8.3 08/24/2020   HGB 13.1 08/24/2020   HCT 39.2 08/24/2020   MCV 86.7 08/24/2020   PLT 345.0 08/24/2020      Lab Results  Component Value Date   ESRSEDRATE 11 03/31/2008   Patient Active Problem List   Diagnosis Date Noted   Pain, joint, multiple sites 08/25/2020   Myalgia 08/25/2020   Secondary hypercoagulable state ([place]) 07/05/2019   Localized swelling, mass, or lump of lower extremity 05/13/2019   Lumbar disc disease    Bilateral bunions    Paroxysmal atrial fibrillation ([place]) 09/27/2018   Fatigue 09/27/2018   Essential hypertension 01/01/2015    Hypothyroidism 06/25/2014   Migraine with vertigo 05/21/2014   History of Chiari malformation 05/21/2014   Atrophic vaginitis 05/01/2013   Colon cancer screening 04/20/2012   Encounter for routine gynecological examination 11/24/2010   Routine general medical examination at a health care facility 11/18/2010   INSOMNIA 01/27/2010   HEADACHE 03/31/2008   Disorder of bone and cartilage 09/18/2007   Generalized anxiety disorder 05/05/2006   MITRAL VALVE PROLAPSE 05/05/2006   ALLERGIC RHINITIS 05/05/2006   FIBROCYSTIC BREAST DISEASE 05/05/2006   SEBORRHEIC DERMATITIS 05/05/2006   KERATOSIS, ACTINIC 05/05/2006   NECK PAIN, CHRONIC 05/05/2006   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.0)    Osteopenia    Seborrheic dermatitis, unspecified    Vaginal cyst    stable   Past Surgical History:  Procedure Laterality Date   AIR/FLUID EXCHANGE Right 07/25/2018   Procedure: Air/Fluid Exchange;  Surgeon: Jalene Mullet, MD;  Location: [facility];  Service: Ophthalmology;  Laterality: Right;   bone spur removal  10/06   shoulder   CATARACT EXTRACTION, BILATERAL Bilateral 2016   CERVICAL [place] SURGERY  2005   Chiari Malformation surgery  6/03   EXCISION MORTON'S NEUROMA  2/04   GAS INSERTION Right 07/25/2018   Procedure: Insertion Of Gas SF6;  Surgeon: Jalene Mullet, MD;  Location: [facility];  Service: Ophthalmology;  Laterality: Right;   LUMBAR DISC SURGERY     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   Procedure: REPAIR OF COMPLEX TRACTION RETINAL DETACHMENT - 25GA VITRECTOMY, ENDOLASER, DRAINAGE OF SUBRETINAL FLUID;  Surgeon: Jalene Mullet, MD;  Location: [facility];  Service: Ophthalmology;  Laterality: Right;   TUBAL  LIGATION     Social History   Tobacco Use   Smoking status: Former    Types: Cigarettes    Quit date: 01/17/2001    Years since quitting: 19.6   Smokeless tobacco: Never  Substance Use Topics   Alcohol use: No    Alcohol/week: 0.0 standard drinks   Drug use: No   Family History  Problem Relation Age of Onset   Melanoma Sister    Lung cancer Father    Coronary artery disease Mother    Heart failure Mother    Hypertension Mother    Anxiety disorder Son    Lung cancer Brother        + smoker   Breast cancer Neg Hx    Allergies  Allergen Reactions   Buspar [Buspirone]     Felt funny   Calcium     REACTION: cannot tolerate any calcium supplement - GI sympotms   Fluticasone Propionate     REACTION: headache/irritation   Hctz [Hydrochlorothiazide] Other (See Comments)    Eye redness and discomfort/ slt blurred vision    Paroxetine     REACTION: abdominal pain and constipation   Zoloft [Sertraline]     Diarrhea    Current Outpatient Medications on File Prior to Visit  Medication Sig Dispense Refill   amLODipine (NORVASC) 5 MG tablet Take 1 tablet (5 mg total) by mouth daily. 90 tablet 3   DENTA 5000 PLUS 1.1 % CREA dental cream Take 1 application by mouth at bedtime.      levothyroxine (SYNTHROID) 50 MCG tablet TAKE 1 TABLET BY MOUTH DAILY BEFORE BREAKFAST 90 tablet 3   LORazepam (ATIVAN) 1 MG tablet TAKE 1/2 (ONE-HALF) TABLET BY MOUTH AT BEDTIME AS NEEDED 15 tablet 3   meclizine (ANTIVERT) 25 MG tablet Take 1 tablet (25 mg total) by mouth 3 (three) times daily as needed for dizziness. 15 tablet 0   metoprolol succinate (TOPROL-XL) 25 MG 24 hr tablet Take 0.5 tablets (12.5 mg total) by mouth daily. 45 tablet 3   mometasone (ELOCON) 0.1 % ointment Apply topically daily. Apply to itchy areas on the back bid prn 45 g 1   mupirocin ointment (BACTROBAN) 2 % Apply to each nostril area daily 22 g 2   rivaroxaban (XARELTO) 20 MG TABS tablet Take 1 tablet (20 mg total) by mouth daily  with supper. 90 tablet 2   Tavaborole (KERYDIN) 5 % SOLN Apply to toenails at bedtme 4 mL 2   No current facility-administered medications on file prior to visit.     Review of Systems  Constitutional:  Positive for fatigue. Negative for activity change, appetite change, fever and unexpected weight change.  HENT:  Negative for congestion, ear pain, rhinorrhea, sinus pressure and sore throat.   Eyes:  Negative for pain, redness and visual disturbance.  Respiratory:  Negative for cough, shortness of breath and wheezing.   Cardiovascular:  Negative for chest pain and palpitations.  Gastrointestinal:  Negative for abdominal pain, blood in stool, constipation and diarrhea.  Endocrine: Negative for polydipsia and polyuria.  Genitourinary:  Negative for dysuria, frequency and urgency.  Musculoskeletal:  Positive for arthralgias, back pain, myalgias and neck pain. Negative for joint swelling.  Skin:  Negative for pallor and rash.  Allergic/Immunologic: Negative for environmental allergies.  Neurological:  Negative for dizziness, tremors, syncope, weakness, numbness and headaches.  Hematological:  Negative for adenopathy. Does not bruise/bleed easily.  Psychiatric/Behavioral:  Negative for decreased concentration and dysphoric mood. The patient is not nervous/anxious.       Objective:   Physical Exam Constitutional:      General: She is not in acute distress.    Appearance: Normal appearance. She is well-developed and normal weight. She is not ill-appearing or diaphoretic.  HENT:     Head: Normocephalic and atraumatic.  Eyes:     General: No scleral icterus.       Right eye: No discharge.        Left eye: No discharge.     Conjunctiva/sclera: Conjunctivae normal.     Pupils: Pupils are equal, round, and reactive to light.  Neck:     Thyroid: No thyromegaly.     Vascular: No carotid bruit or JVD.     Comments: Fair rom  Tender in trapezius bilat No crepitus Surg scar noted   Tight  cervical musculature  Cardiovascular:     Rate and Rhythm: Normal rate and regular rhythm.     Heart sounds: Normal heart sounds.    No gallop.  Pulmonary:     Effort: Pulmonary effort is normal. No respiratory distress.     Breath sounds: Normal breath sounds. No wheezing or rales.  Abdominal:     General: Bowel sounds are normal. There is no distension or abdominal bruit.     Palpations: Abdomen is soft. There is no mass.     Tenderness: There is no abdominal tenderness.  Musculoskeletal:        General: No swelling or deformity.     Cervical back: Normal range of motion and neck supple. Tenderness present.     Right lower leg: No edema.     Left lower leg: No edema.     Comments: Nl rom of all joints without crepitus No joint deformity or swelling   Tender in shoulder girdle and in quad area  Gait is slow and guarded due to stiffness/pain     Lymphadenopathy:     Cervical: No cervical adenopathy.  Skin:    General: Skin is warm and dry.     Coloration: Skin is not jaundiced or pale.     Findings: No bruising, erythema or rash.  Neurological:     Mental Status: She is alert.     Cranial Nerves: No cranial nerve deficit.     Sensory: No sensory deficit.     Motor: No weakness.     Coordination: Coordination normal.     Deep Tendon Reflexes: Reflexes are normal and symmetric. Reflexes normal.  Psychiatric:        Mood and Affect: Mood normal.     Comments: Pt voices frustration over her symptoms  Not tearful  Pleasant           Assessment & Plan:   Problem List Items Addressed This Visit  Other   Fatigue    Worse in the setting of all over body pain  Reassuring labs yesterday  Auto immune lab today and lyme ab screen       Pain, joint, multiple sites - Primary    Joint and muscle pain /myofasical Worst in upper body and shoulder girdle  Some trap tenderness on exam  No tenderness over any trigger points however Started in June with stressful  event Disc differential incl PMR, OA, RA and auto imm joint dz and less likely lyme  Of note -immediately resolved with prednisone  Labs today  Plan to follow Disc use of acetaminophen and voltaren gel while waiting for results        Relevant Orders   ANA   Rheumatoid factor   Sedimentation Rate   B. [place] Antibodies   CK   C-reactive protein   Myalgia    Myalgia as well as arthralgia-worse in shoulder girdle  Lab including cpk ordered today         Relevant Orders   Sedimentation Rate   CK   C-reactive protein

## 2020-08-25 NOTE — Assessment & Plan Note (Signed)
Myalgia as well as arthralgia-worse in shoulder girdle  Lab including cpk ordered today

## 2020-08-27 LAB — ANA: Anti Nuclear Antibody (ANA): NEGATIVE

## 2020-08-27 LAB — B. BURGDORFI ANTIBODIES: B burgdorferi Ab IgG+IgM: <0.9 index

## 2020-08-27 LAB — RHEUMATOID FACTOR: Rheumatoid fact SerPl-aCnc: 15 IU/mL — ABNORMAL HIGH (ref <14)

## 2020-08-28 ENCOUNTER — Telehealth: Payer: Self-pay | Admitting: Family Medicine

## 2020-08-28 DIAGNOSIS — R768 Other specified abnormal immunological findings in serum: Secondary | ICD-10-CM

## 2020-08-28 DIAGNOSIS — M2550 Pain in unspecified joint: Secondary | ICD-10-CM

## 2020-08-28 MED ORDER — PREDNISONE 10 MG PO TABS: ORAL_TABLET | ORAL | 0 refills | Status: DC

## 2020-08-28 NOTE — Telephone Encounter (Signed)
Pt notified of lab results and Dr. Marliss Coots comments. Pt agrees with rheumatology referral she would like to see someone in [place] if possible. Pt also does want to do a course of prednisone she uses [facility] [address]

## 2020-08-28 NOTE — Telephone Encounter (Signed)
-----   Message from Abner Greenspan, MD sent at 08/27/2020  3:37 PM EDT ----- ANA is negative   For elevated rheumatoid factor and current symptoms I would like to refer her to rheumatology  Also can do a course of prednisone since it will take a while to get in  What pharmacy does she prefer? What area for rheumatology (if we have a choice) ?

## 2020-08-28 NOTE — Telephone Encounter (Signed)
Referral done  There may be a wait   Prednisone sent  Update [place] if no improvement May make her hyper/hungry

## 2020-08-28 NOTE — Telephone Encounter (Signed)
Denise Grant called in wanted to know about getting the lab results from 8/9 visit and the game plan for what to do next.

## 2020-08-31 ENCOUNTER — Encounter: Payer: Self-pay | Admitting: Family Medicine

## 2020-08-31 ENCOUNTER — Other Ambulatory Visit: Payer: Self-pay

## 2020-08-31 ENCOUNTER — Ambulatory Visit (INDEPENDENT_AMBULATORY_CARE_PROVIDER_SITE_OTHER): Payer: Medicare HMO | Admitting: Family Medicine

## 2020-08-31 VITALS — BP 130/70 | HR 83 | Temp 98.3°F | Ht 65.5 in | Wt 149.3 lb

## 2020-08-31 DIAGNOSIS — Z1211 Encounter for screening for malignant neoplasm of colon: Secondary | ICD-10-CM

## 2020-08-31 DIAGNOSIS — M2550 Pain in unspecified joint: Secondary | ICD-10-CM | POA: Diagnosis not present

## 2020-08-31 DIAGNOSIS — M8580 Other specified disorders of bone density and structure, unspecified site: Secondary | ICD-10-CM

## 2020-08-31 DIAGNOSIS — Z0000 Encounter for general adult medical examination without abnormal findings: Secondary | ICD-10-CM | POA: Diagnosis not present

## 2020-08-31 DIAGNOSIS — E039 Hypothyroidism, unspecified: Secondary | ICD-10-CM | POA: Diagnosis not present

## 2020-08-31 DIAGNOSIS — D6869 Other thrombophilia: Secondary | ICD-10-CM

## 2020-08-31 DIAGNOSIS — I10 Essential (primary) hypertension: Secondary | ICD-10-CM | POA: Diagnosis not present

## 2020-08-31 DIAGNOSIS — I480 Paroxysmal atrial fibrillation: Secondary | ICD-10-CM | POA: Diagnosis not present

## 2020-08-31 MED ORDER — LEVOTHYROXINE SODIUM 50 MCG PO TABS: ORAL_TABLET | ORAL | 3 refills | Status: DC

## 2020-08-31 MED ORDER — METHYLPREDNISOLONE 4 MG PO TBPK: ORAL_TABLET | ORAL | 0 refills | Status: DC

## 2020-08-31 MED ORDER — AMLODIPINE BESYLATE 5 MG PO TABS: 5.0000 mg | ORAL_TABLET | Freq: Every day | ORAL | 3 refills | Status: DC

## 2020-08-31 NOTE — Assessment & Plan Note (Signed)
dexa 5/14 Declines another currently  No falls or fx  Taking vit D On prednisone (not long term at the moment) Wants to get back to walking when rheum problem resolves

## 2020-08-31 NOTE — Assessment & Plan Note (Signed)
This flared with recent prednisone  Better today-reassuring exam

## 2020-08-31 NOTE — Assessment & Plan Note (Signed)
Taking xarelto now without problems  A fib

## 2020-08-31 NOTE — Assessment & Plan Note (Signed)
bp in fair control at this time  BP Readings from Last 1 Encounters:  08/31/20 130/70   No changes needed Most recent labs reviewed  Disc lifstyle change with low sodium diet and exercise  Plan to continue amlodipine 5 mg daily and metoprolol xl 12.5 mg daily Continue cardiology care for a fib also

## 2020-08-31 NOTE — Assessment & Plan Note (Signed)
Hypothyroidism  Pt has no clinical changes No change in energy level/ hair or skin/ edema and no tremor Lab Results  Component Value Date   TSH 0.53 08/24/2020     Plan to continue levothyroxine 50 mcg daily

## 2020-08-31 NOTE — Progress Notes (Signed)
Subjective:    Patient ID: Denise Grant, female    DOB: 10-20-1955, 65 y.o.   MRN: [id number]  This visit occurred during the SARS-CoV-2 public health emergency.  Safety protocols were in place, including screening questions prior to the visit, additional usage of staff PPE, and extensive cleaning of exam room while observing appropriate contact time as indicated for disinfecting solutions.   HPI Pt presents for amw and health mt exam with f/u of chronic medical problems  I have personally reviewed the Medicare Annual Wellness questionnaire and have noted 1. The patient's medical and social history 2. Their use of alcohol, tobacco or illicit drugs 3. Their current medications and supplements 4. The patient's functional ability including ADL's, fall risks, home safety risks and hearing or visual             impairment. 5. Diet and physical activities 6. Evidence for depression or mood disorders  The patients weight, height, BMI have been recorded in the chart and visual acuity is per eye clinic.  I have made referrals, counseling and provided education to the patient based review of the above and I have provided the pt with a written personalized care plan for preventive services. Reviewed and updated provider list, see scanned forms.  See scanned forms.  Routine anticipatory guidance given to patient.  See health maintenance. Colon cancer screening - cologuard neg 9/21 Breast cancer screening mammogram 7/21- has is scheduled in sept Self breast exam-no lumps  Flu vaccine -fall Tetanus vaccine  Tdap 4/14 Covid imm  Pneumovax- declines  Zoster vaccine- not interested  Dexa-5/14 - declines  Falls-none  Fractures-none Supplements-vit D  Exercise -not much lately due to pain  Normally walks 4-5 day per week   Advance directive-up to date  Cognitive function addressed- see scanned forms- and if abnormal then additional documentation follows.   No problems at all with memory    PMH and [place] reviewed  Meds, vitals, and allergies reviewed.   ROS: See HPI.  Otherwise negative.    Weight : Wt Readings from Last 3 Encounters:  08/31/20 149 lb 5 oz (67.7 kg)  08/25/20 149 lb 5 oz (67.7 kg)  07/08/20 151 lb 6.4 oz (68.7 kg)   24.47 kg/m  Hearing/vision: Hearing Screening   '500Hz'$  '1000Hz'$  '2000Hz'$  '4000Hz'$   Right ear 40 40 40 40  Left ear 40 40 40 40  Vision Screening - Comments:: Eye exam with Dr. Posey Pronto in Jan 2022   Care team [facility] [place] h/o melanoma in sister  Martin Majestic in Feb to derm/nothing new   HTN with a fib seen by cardiology bp is stable today  No cp or palpitations or headaches or edema  No side effects to medicines  BP Readings from Last 3 Encounters:  08/31/20 130/70  08/25/20 (!) 146/72  07/08/20 128/70    Takes amlodipine 5 mg daily  Metoprolol xl 12.5 mg daily   Pulse Readings from Last 3 Encounters:  08/31/20 83  08/25/20 76  07/08/20 72   Does not feel like she is in afib today  Occ flutter   Lab Results  Component Value Date   CREATININE 0.78 08/24/2020   BUN 16 08/24/2020   NA 138 08/24/2020   K 4.7 08/24/2020   CL 100 08/24/2020   CO2 30 08/24/2020   Hypothyroidism  Pt has no clinical changes No change in energy level/ hair or skin/ edema and no tremor Lab Results  Component Value Date   TSH 0.53 08/24/2020    Levothyroxine 50 mcg daily    Taking prednisone for joint pain / rheum referral pending with elevated RF It messed with her a fib  Had to go down to 20 mg from 40  Also bothered stomach  It helps her joints   Ortho gave her methylprednisolone 4 mg dose pak    Lab Results  Component Value Date   WBC 8.3 08/24/2020   HGB 13.1 08/24/2020   HCT 39.2 08/24/2020   MCV 86.7 08/24/2020   PLT 345.0 08/24/2020   Lab Results  Component Value Date   ALT 17 08/24/2020   AST 15 08/24/2020   ALKPHOS 58 08/24/2020   BILITOT 0.5 08/24/2020    Cholesterol  Lab Results  Component Value Date   CHOL 155 08/24/2020   CHOL 158 08/23/2019   CHOL 163 08/17/2018   Lab Results  Component Value Date   HDL 54.70 08/24/2020   HDL 55.00 08/23/2019   HDL 60.70 08/17/2018   Lab Results  Component Value Date   LDLCALC 83 08/24/2020   LDLCALC 91 08/23/2019   LDLCALC 86 08/17/2018   Lab Results  Component Value Date   TRIG 85.0 08/24/2020   TRIG 62.0 08/23/2019   TRIG 83.0 08/17/2018   Lab Results  Component Value Date   CHOLHDL 3 08/24/2020   CHOLHDL 3 08/23/2019   CHOLHDL 3 08/17/2018   No results found for: LDLDIRECT  Patient Active Problem List   Diagnosis Date Noted   Medicare annual wellness visit, initial 08/31/2020   Rheumatoid factor positive 08/28/2020   Pain, joint, multiple sites 08/25/2020   Myalgia 08/25/2020   Secondary hypercoagulable state ([place]) 07/05/2019   Localized swelling, mass, or lump of lower extremity 05/13/2019   Lumbar disc disease    Bilateral bunions    Paroxysmal atrial fibrillation ([place]) 09/27/2018   Fatigue 09/27/2018   Essential hypertension 01/01/2015   Hypothyroidism 06/25/2014   Migraine with vertigo 05/21/2014   History of Chiari malformation 05/21/2014   Atrophic vaginitis 05/01/2013   Colon cancer screening 04/20/2012   Encounter for routine gynecological examination 11/24/2010   Routine general medical examination at a health care facility 11/18/2010   INSOMNIA 01/27/2010   HEADACHE 03/31/2008   Osteopenia 09/18/2007   Generalized anxiety disorder 05/05/2006   MITRAL VALVE PROLAPSE 05/05/2006   ALLERGIC RHINITIS 05/05/2006   FIBROCYSTIC BREAST DISEASE 05/05/2006   SEBORRHEIC DERMATITIS 05/05/2006   KERATOSIS, ACTINIC 05/05/2006   NECK PAIN, CHRONIC 05/05/2006   Past Medical History:  Diagnosis Date   Actinic keratosis    Allergic rhinitis    Anxiety    Chiari malformation    DDD (degenerative disc disease)    Family history of other specified malignant  neoplasm    Headache(784.0)    Insomnia, unspecified    Lactose intolerance    Mitral valve disorders(424.0)    Osteopenia    Seborrheic dermatitis, unspecified    Vaginal cyst    stable   Past Surgical History:  Procedure Laterality Date   AIR/FLUID EXCHANGE Right 07/25/2018   Procedure: Air/Fluid Exchange;  Surgeon: Jalene Mullet, MD;  Location: [facility];  Service: Ophthalmology;  Laterality: Right;   bone spur removal  10/06   shoulder   CATARACT EXTRACTION, BILATERAL Bilateral 2016   CERVICAL [place] SURGERY  2005   Chiari Malformation surgery  6/03   EXCISION MORTON'S NEUROMA  2/04   GAS INSERTION Right 07/25/2018   Procedure: Insertion Of Gas SF6;  Surgeon: Jalene Mullet, MD;  Location: [facility];  Service: Ophthalmology;  Laterality: Right;   LUMBAR DISC SURGERY     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   Procedure: REPAIR OF COMPLEX TRACTION RETINAL DETACHMENT - 25GA VITRECTOMY, ENDOLASER, DRAINAGE OF SUBRETINAL FLUID;  Surgeon: Jalene Mullet, MD;  Location: [facility];  Service: Ophthalmology;  Laterality: Right;   TUBAL LIGATION     Social History   Tobacco Use   Smoking status: Former    Types: Cigarettes    Quit date: 01/17/2001    Years since quitting: 19.6   Smokeless tobacco: Never  Substance Use Topics   Alcohol use: No    Alcohol/week: 0.0 standard drinks   Drug use: No   Family History  Problem Relation Age of Onset   Melanoma Sister    Lung cancer Father    Coronary artery disease Mother    Heart failure Mother    Hypertension Mother    Anxiety disorder Son    Lung cancer Brother        + smoker   Breast cancer Neg Hx    Allergies  Allergen Reactions   Buspar [Buspirone]     Felt funny   Calcium     REACTION: cannot tolerate any calcium supplement - GI sympotms   Fluticasone Propionate     REACTION: headache/irritation   Hctz [Hydrochlorothiazide] Other (See Comments)    Eye redness and discomfort/ slt blurred vision     Paroxetine     REACTION: abdominal pain and constipation   Zoloft [Sertraline]     Diarrhea    Current Outpatient Medications on File Prior to Visit  Medication Sig Dispense Refill   DENTA 5000 PLUS 1.1 % CREA dental cream Take 1 application by mouth at bedtime.      LORazepam (ATIVAN) 1 MG tablet TAKE 1/2 (ONE-HALF) TABLET BY MOUTH AT BEDTIME AS NEEDED 15 tablet 3   meclizine (ANTIVERT) 25 MG tablet Take 1 tablet (25 mg total) by mouth 3 (three) times daily as needed for dizziness. 15 tablet 0   metoprolol succinate (TOPROL-XL) 25 MG 24 hr tablet Take 0.5 tablets (12.5 mg total) by mouth daily. 45 tablet 3   mometasone (ELOCON) 0.1 % ointment Apply topically daily. Apply to itchy areas on the back bid prn 45 g 1   mupirocin ointment (BACTROBAN) 2 % Apply to each nostril area daily 22 g 2   rivaroxaban (XARELTO) 20 MG TABS tablet Take 1 tablet (20 mg total) by mouth daily with supper. 90 tablet 2   Tavaborole (KERYDIN) 5 % SOLN Apply to toenails at bedtme 4 mL 2   No current facility-administered medications on file prior to visit.    Review of Systems  Constitutional:  Positive for fatigue. Negative for activity change, appetite change, fever and unexpected weight change.  HENT:  Negative for congestion, ear pain, rhinorrhea, sinus pressure and sore throat.   Eyes:  Negative for pain, redness and visual disturbance.  Respiratory:  Negative for cough, shortness of breath and wheezing.   Cardiovascular:  Positive for palpitations. Negative for chest pain and leg swelling.  Gastrointestinal:  Negative for abdominal pain, blood in stool, constipation and diarrhea.  Endocrine: Negative for polydipsia and polyuria.  Genitourinary:  Negative for dysuria, frequency and urgency.  Musculoskeletal:  Positive for arthralgias, back pain and myalgias.  Skin:  Negative for pallor and rash.  Allergic/Immunologic: Negative for environmental allergies.  Neurological:  Negative for dizziness, syncope and  headaches.  Hematological:  Negative for adenopathy. Does not bruise/bleed easily.  Psychiatric/Behavioral:  Negative for decreased concentration and dysphoric mood. The patient is not nervous/anxious.       Objective:   Physical Exam Constitutional:      General: She is not in acute distress.    Appearance: Normal appearance. She is well-developed and normal weight. She is not ill-appearing or diaphoretic.  HENT:     Head: Normocephalic and atraumatic.     Right Ear: Tympanic membrane, ear canal and external ear normal.     Left Ear: Tympanic membrane, ear canal and external ear normal.     Nose: Nose normal. No congestion.     Mouth/Throat:     Mouth: Mucous membranes are moist.     Pharynx: Oropharynx is clear. No posterior oropharyngeal erythema.  Eyes:     General: No scleral icterus.    Extraocular Movements: Extraocular movements intact.     Conjunctiva/sclera: Conjunctivae normal.     Pupils: Pupils are equal, round, and reactive to light.  Neck:     Thyroid: No thyromegaly.     Vascular: No carotid bruit or JVD.  Cardiovascular:     Rate and Rhythm: Normal rate and regular rhythm.     Pulses: Normal pulses.     Heart sounds: Normal heart sounds.    No gallop.  Pulmonary:     Effort: Pulmonary effort is normal. No respiratory distress.     Breath sounds: Normal breath sounds. No wheezing.     Comments: Good air exch Chest:     Chest wall: No tenderness.  Abdominal:     General: Bowel sounds are normal. There is no distension or abdominal bruit.     Palpations: Abdomen is soft. There is no mass.     Tenderness: There is no abdominal tenderness.     Hernia: No hernia is present.  Genitourinary:    Comments: Breast exam: No mass, nodules, thickening, tenderness, bulging, retraction, inflamation, nipple discharge or skin changes noted.  No axillary or clavicular LA.     Musculoskeletal:        General: No tenderness. Normal range of motion.     Cervical back: Normal  range of motion and neck supple. No rigidity. No muscular tenderness.     Right lower leg: No edema.     Left lower leg: No edema.     Comments: No kyphosis   Lymphadenopathy:     Cervical: No cervical adenopathy.  Skin:    General: Skin is warm and dry.     Coloration: Skin is not pale.     Findings: No erythema or rash.     Comments: Tanned Solar lentigines diffusely  Sk under R breast    Neurological:     Mental Status: She is alert. Mental status is at baseline.     Cranial Nerves: No cranial nerve deficit.     Motor: No abnormal muscle tone.     Coordination: Coordination normal.     Gait: Gait normal.     Deep Tendon Reflexes: Reflexes are normal and symmetric. Reflexes normal.  Psychiatric:        Mood and Affect: Mood normal.        Cognition and Memory: Cognition and memory normal.     Comments: Pleasant  Mentally sharp          Assessment & Plan:   Problem List Items Addressed This Visit       Cardiovascular and Mediastinum  Essential hypertension    bp in fair control at this time  BP Readings from Last 1 Encounters:  08/31/20 130/70  No changes needed Most recent labs reviewed  Disc lifstyle change with low sodium diet and exercise  Plan to continue amlodipine 5 mg daily and metoprolol xl 12.5 mg daily Continue cardiology care for a fib also      Relevant Medications   amLODipine (NORVASC) 5 MG tablet   Paroxysmal atrial fibrillation ([facility])    This flared with recent prednisone  Better today-reassuring exam       Relevant Medications   amLODipine (NORVASC) 5 MG tablet     Endocrine   Hypothyroidism    Hypothyroidism  Pt has no clinical changes No change in energy level/ hair or skin/ edema and no tremor Lab Results  Component Value Date   TSH 0.53 08/24/2020     Plan to continue levothyroxine 50 mcg daily       Relevant Medications   levothyroxine (SYNTHROID) 50 MCG tablet     Musculoskeletal and Integument   Osteopenia    dexa  5/14 Declines another currently  No falls or fx  Taking vit D On prednisone (not long term at the moment) Wants to get back to walking when rheum problem resolves        Other   Routine general medical examination at a health care facility    Reviewed health habits including diet and exercise and skin cancer prevention Reviewed appropriate screening tests for age  Also reviewed health mt list, fam hx and immunization status , as well as social and family history   See HPI Labs reviewed  cologuard neg 09/2019 Mammogram planned next month  Declines pna vaccine  Declines dexa (no falls or fx) and taking vit D covid immunized  Advance directive utd No cognitive concerns Nl hearing screen  utd vision/eye care       Colon cancer screening    cologuard neg 9/21  Good for 3 years       Secondary hypercoagulable state ([place])    Taking xarelto now without problems  A fib      Pain, joint, multiple sites    Arthralgia and myalgia with pos RF Rev labs Rheum ref pending  On prednisone-not tolerating well  Will try change to medrol dose pk which she tolerated better in the past      Medicare annual wellness visit, initial - Primary    Reviewed health habits including diet and exercise and skin cancer prevention Reviewed appropriate screening tests for age  Also reviewed health mt list, fam hx and immunization status , as well as social and family history   See HPI Labs reviewed  cologuard neg 09/2019 Mammogram planned next month  Declines pna vaccine  Declines dexa (no falls or fx) and taking vit D covid immunized  Advance directive utd No cognitive concerns Nl hearing screen  utd vision/eye care        .

## 2020-08-31 NOTE — Assessment & Plan Note (Signed)
Reviewed health habits including diet and exercise and skin cancer prevention Reviewed appropriate screening tests for age  Also reviewed health mt list, fam hx and immunization status , as well as social and family history   See HPI Labs reviewed  cologuard neg 09/2019 Mammogram planned next month  Declines pna vaccine  Declines dexa (no falls or fx) and taking vit D covid immunized  Advance directive utd No cognitive concerns Nl hearing screen  utd vision/eye care

## 2020-08-31 NOTE — Assessment & Plan Note (Signed)
Arthralgia and myalgia with pos RF Rev labs Rheum ref pending  On prednisone-not tolerating well  Will try change to medrol dose pk which she tolerated better in the past

## 2020-08-31 NOTE — Assessment & Plan Note (Signed)
cologuard neg 9/21  Good for 3 years

## 2020-08-31 NOTE — Patient Instructions (Addendum)
We can try methylprednisolone dose pack   Take care of yourself  Continue dermatology visits  Use sun protection   Labs are stable

## 2020-09-08 ENCOUNTER — Telehealth: Payer: Self-pay | Admitting: Family Medicine

## 2020-09-08 DIAGNOSIS — H59811 Chorioretinal scars after surgery for detachment, right eye: Secondary | ICD-10-CM | POA: Diagnosis not present

## 2020-09-08 DIAGNOSIS — H35342 Macular cyst, hole, or pseudohole, left eye: Secondary | ICD-10-CM | POA: Diagnosis not present

## 2020-09-08 DIAGNOSIS — H35373 Puckering of macula, bilateral: Secondary | ICD-10-CM | POA: Diagnosis not present

## 2020-09-08 DIAGNOSIS — H338 Other retinal detachments: Secondary | ICD-10-CM | POA: Diagnosis not present

## 2020-09-08 DIAGNOSIS — H35413 Lattice degeneration of retina, bilateral: Secondary | ICD-10-CM | POA: Diagnosis not present

## 2020-09-08 NOTE — Telephone Encounter (Signed)
8.23.22 pt. Stated she is waiting on her referral she requested to see a DR.  In [place]. Patient aslo stated that Dr. Posey Pronto has openings in [place]. ([place])

## 2020-09-08 NOTE — Telephone Encounter (Signed)
Referral was just put in 09/07/20 I will send it to Rheum in [place] - Dr Ephriam Jenkins  Pt can call and request appt.   [facility] - Rheumatology [facility] [address] [place], [place] [address] Office: [phone]  Fax: [phone]

## 2020-09-09 ENCOUNTER — Other Ambulatory Visit: Payer: Self-pay | Admitting: Family Medicine

## 2020-09-09 ENCOUNTER — Telehealth: Payer: Self-pay | Admitting: *Deleted

## 2020-09-09 MED ORDER — PREDNISONE 5 MG PO TABS: 5.0000 mg | ORAL_TABLET | Freq: Every day | ORAL | 1 refills | Status: DC

## 2020-09-09 NOTE — Telephone Encounter (Signed)
I sent it and keep [place] posted.   Please try to stop it several days before rheum visit so they can better assess (if able)   Let me know if it does not help

## 2020-09-09 NOTE — Telephone Encounter (Signed)
Pt notified Rx sent to pharmacy and advised of Dr. Marliss Coots instructions and verbalized understanding. Pt will stop med at least a week before appt

## 2020-09-09 NOTE — Telephone Encounter (Signed)
Patient called stating that she is not able to see a rheumatologist until the end of September. Patient stated that she is hoping that Dr. Glori Bickers will send her Prednisone 5 mg to the pharmacy to try and help her until she can see the rheumatologist. Pharmacy [facility]

## 2020-09-09 NOTE — Telephone Encounter (Signed)
Pt notified of the info Ashtyn gave in prev message. Phone # provider and she will call to schedule appt

## 2020-09-14 ENCOUNTER — Telehealth: Payer: Self-pay

## 2020-09-14 NOTE — Telephone Encounter (Signed)
Patient called left message on voicemail. States that she wanted Dr. Glori Bickers to know she has increased her Prednisone to 5 mg bid. The one a day dose was not helping at all. She has noticed a little improvement on the twice a day dose. Wanted to know if that was ok to continue. Would like call back.

## 2020-09-14 NOTE — Telephone Encounter (Signed)
That is fine  She will run out sooner so when/if she needs a refill remind [place] and I will re order with new inst

## 2020-09-14 NOTE — Telephone Encounter (Signed)
Pt notified of Dr. Marliss Coots comments and instructions and verbalized understanding. Pt will call once due for a refill so we can change Rx to BID

## 2020-09-17 MED ORDER — PREDNISONE 5 MG PO TABS: 5.0000 mg | ORAL_TABLET | Freq: Two times a day (BID) | ORAL | 1 refills | Status: DC

## 2020-09-17 NOTE — Addendum Note (Signed)
Addended by: Brenton Grills on: [id number] PM   Modules accepted: Orders

## 2020-09-17 NOTE — Telephone Encounter (Signed)
Vm from pt requesting refill for prednisone.  States she is taking BID.  [place] will [place] for the beach on 09[place]20[place]20 and will be gone all next week.

## 2020-09-17 NOTE — Telephone Encounter (Signed)
DR. Glori Bickers had given the ok to changed med to BID earlier. Rx sent and pt notified

## 2020-09-17 NOTE — Addendum Note (Signed)
Addended by: Tammi Sou on: 09/17/2020 02:39 PM   Modules accepted: Orders

## 2020-09-28 ENCOUNTER — Telehealth (HOSPITAL_COMMUNITY): Payer: Self-pay | Admitting: *Deleted

## 2020-09-28 ENCOUNTER — Other Ambulatory Visit: Payer: Self-pay | Admitting: Family Medicine

## 2020-09-28 NOTE — Telephone Encounter (Signed)
Patient approved for Xarelto patient assistance through 01/16/21. PA# [id number]

## 2020-10-08 ENCOUNTER — Other Ambulatory Visit: Payer: Self-pay

## 2020-10-08 ENCOUNTER — Ambulatory Visit
Admission: RE | Admit: 2020-10-08 | Discharge: 2020-10-08 | Disposition: A | Discharge status: Home / Self Care | Payer: Medicare HMO | Source: Ambulatory Visit | Attending: Family Medicine | Admitting: Family Medicine

## 2020-10-08 DIAGNOSIS — Z1231 Encounter for screening mammogram for malignant neoplasm of breast: Secondary | ICD-10-CM | POA: Insufficient documentation

## 2020-10-08 IMAGING — MG MM DIGITAL SCREENING BILAT W/ TOMO AND CAD
8 series · 8 of 24 positions shown · non-contrast
Comparison: Previous exam(s).

CLINICAL DATA: Screening.

EXAM:
DIGITAL SCREENING BILATERAL MAMMOGRAM WITH TOMOSYNTHESIS AND CAD
TECHNIQUE: Bilateral screening digital craniocaudal and mediolateral oblique
mammograms were obtained. Bilateral screening digital breast
tomosynthesis was performed. The images were evaluated with
computer-aided detection.

[R CC synth-2D]
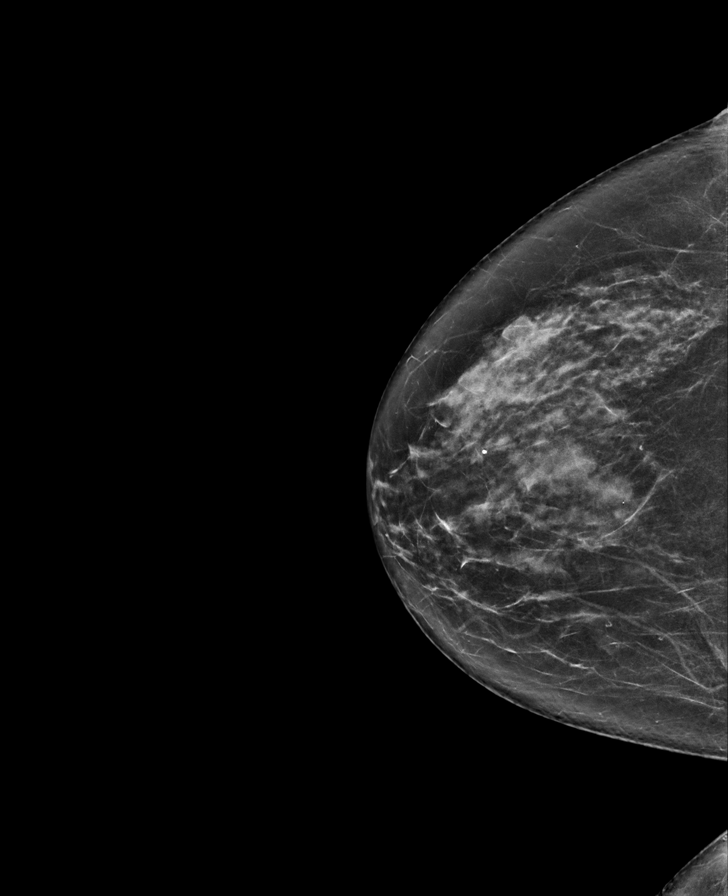

[L MLO synth-2D]
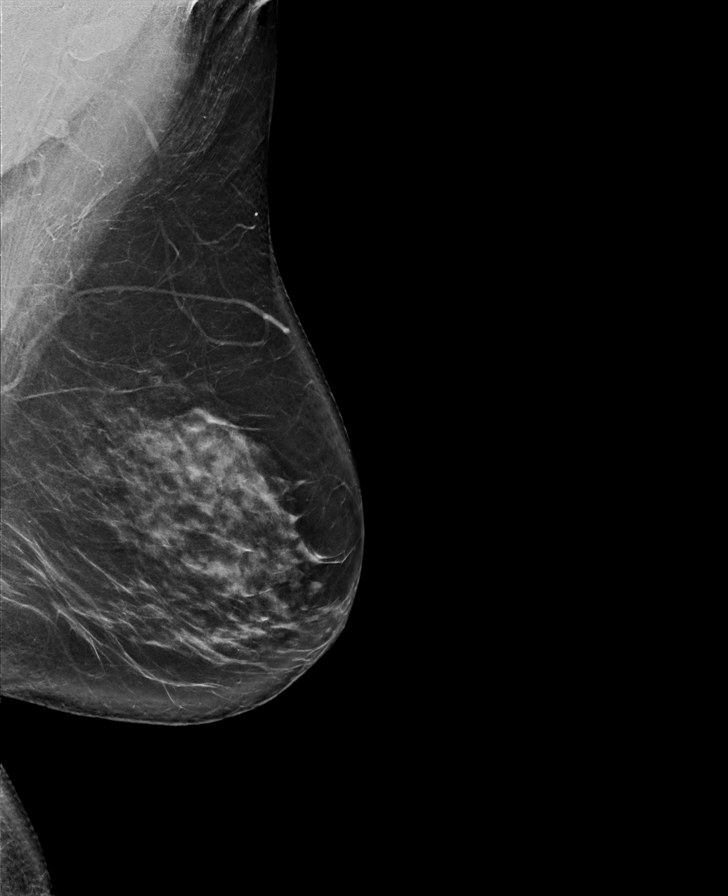

[L CC synth-2D]
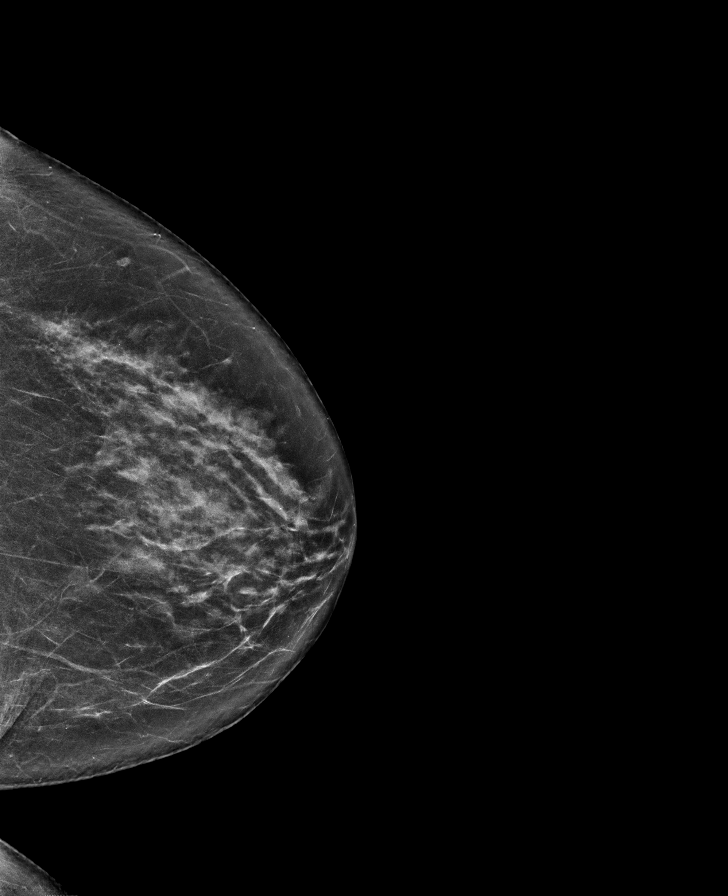

[R MLO synth-2D]
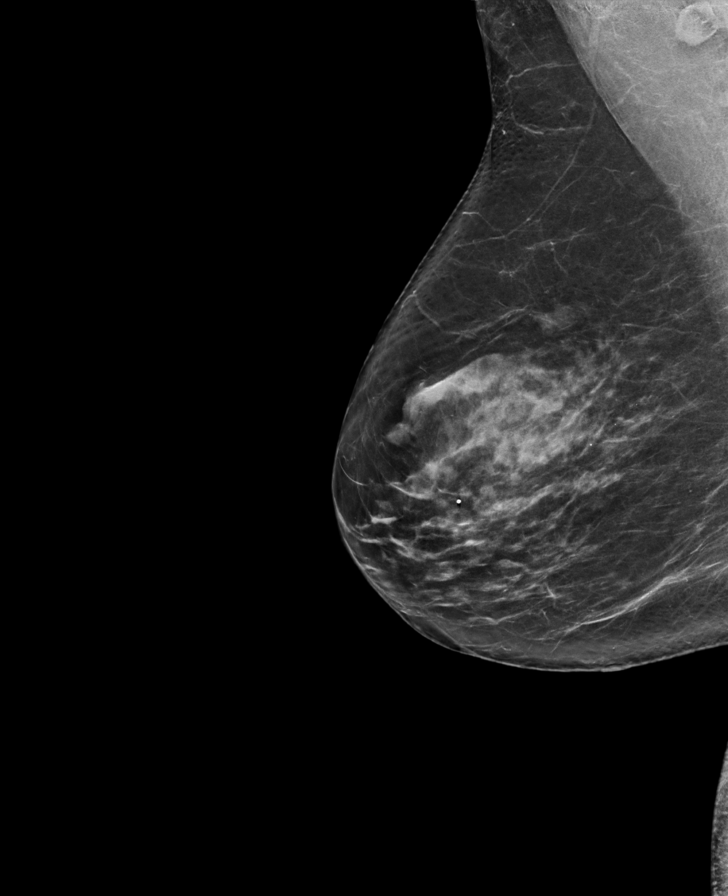

[R CC tomo · tomo slice 36/71.0]
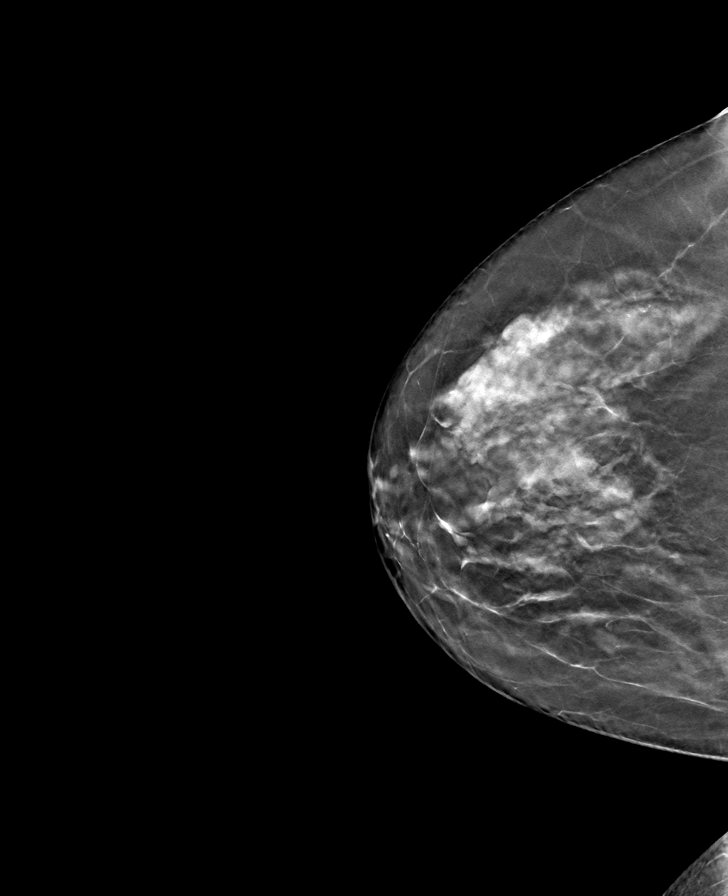

[R MLO tomo · tomo slice 39/76.0]
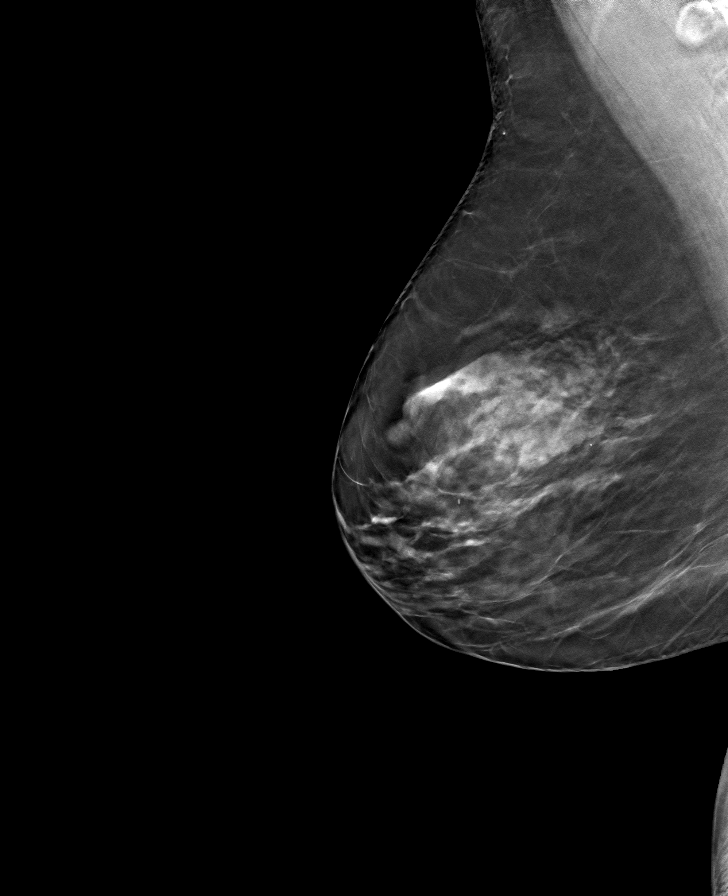

[L CC tomo · tomo slice 37/72.0]
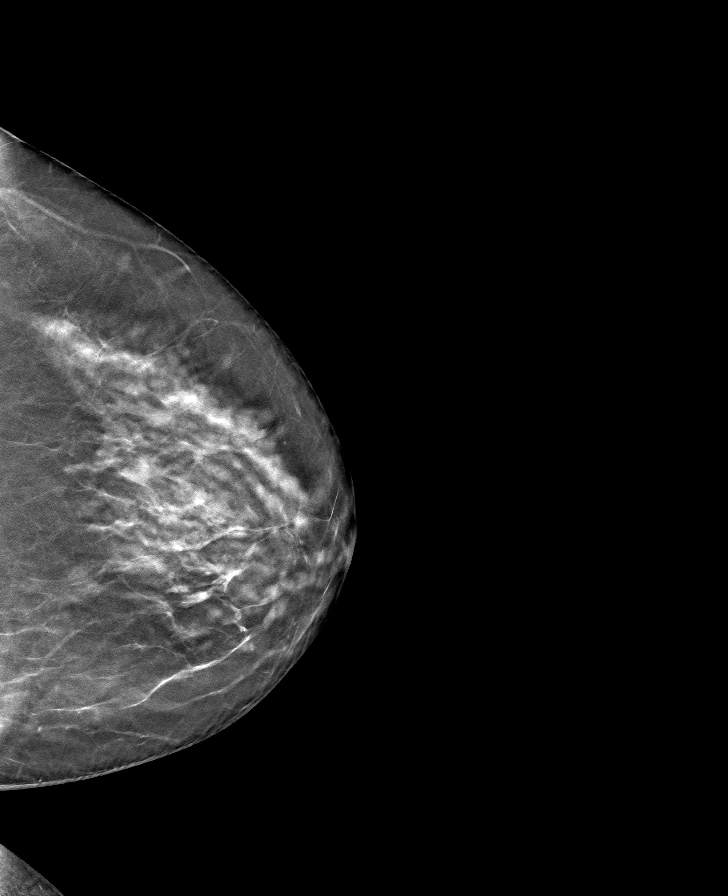

[L MLO tomo · tomo slice 44/87.0]
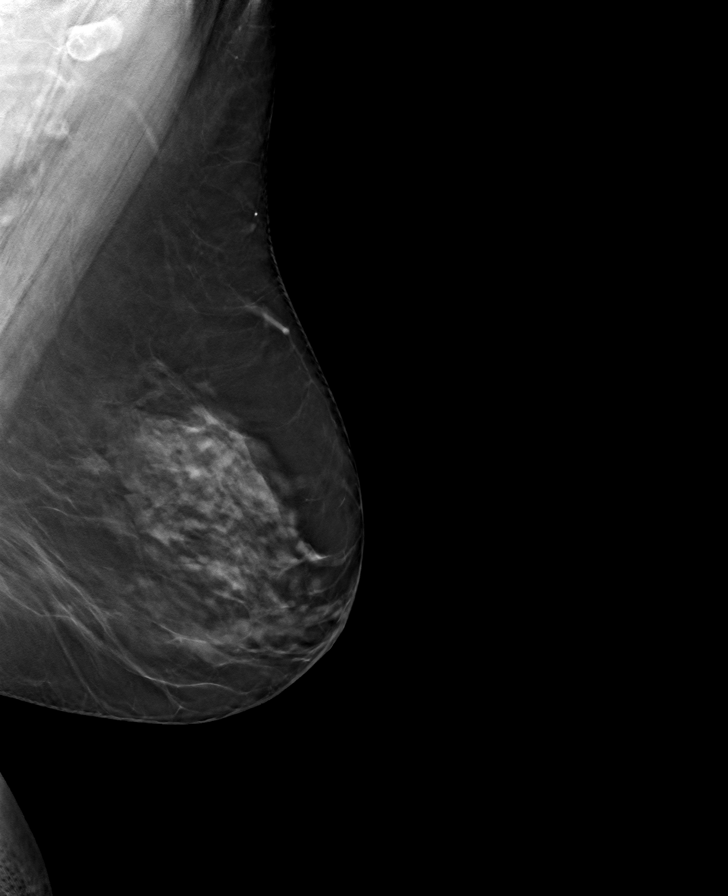

[8 of 24 positions shown; findings below may reference images not displayed]

ACR Breast Density Category c: The breast tissue is heterogeneously
dense, which may obscure small masses.
FINDINGS: There are no findings suspicious for malignancy.
IMPRESSION: No mammographic evidence of malignancy. A result letter of this
screening mammogram will be mailed directly to the patient.

RECOMMENDATION:
Screening mammogram in one year. (Code:[V2])

BI-RADS CATEGORY  1: Negative.

## 2020-10-12 DIAGNOSIS — Z79899 Other long term (current) drug therapy: Secondary | ICD-10-CM | POA: Diagnosis not present

## 2020-10-12 DIAGNOSIS — R768 Other specified abnormal immunological findings in serum: Secondary | ICD-10-CM | POA: Diagnosis not present

## 2020-10-12 DIAGNOSIS — M353 Polymyalgia rheumatica: Secondary | ICD-10-CM | POA: Diagnosis not present

## 2020-10-19 ENCOUNTER — Encounter: Payer: Self-pay | Admitting: Neurology

## 2020-10-19 ENCOUNTER — Other Ambulatory Visit: Payer: Self-pay

## 2020-10-19 ENCOUNTER — Ambulatory Visit: Payer: Medicare HMO | Admitting: Neurology

## 2020-10-19 VITALS — BP 127/64 | HR 71 | Ht 66.0 in | Wt 152.8 lb

## 2020-10-19 DIAGNOSIS — M353 Polymyalgia rheumatica: Secondary | ICD-10-CM

## 2020-10-19 NOTE — Progress Notes (Signed)
[facility] Clinic Note - Initial Visit   Date: 10/19/20  Denise Grant MRN: [id number] DOB: 1955/11/03   Dear Dr. Lynann Bologna:  Thank you for your kind referral of Denise Grant for consultation of generalized pain. Although her history is well known to you, please allow [place] to reiterate it for the purpose of our medical record. The patient was accompanied to the clinic by self.    History of Present Illness: Denise Grant is a 65 y.o. right-handed female with hypertension, hyperlipidemia, atrial fibrillation, s/p ACDF C5-6, and hypothyroidism presenting for evaluation of generalized pain.   Starting in the early summer of 2022, she began experiencing diffuse right arm pain, described as burning and achy pain, which then migrated across her neck into the left arm.  Over time, she developed the same pain into her hips, thighs, and calf muscles. She has muscle tendnerness.  It was so painful to move and walk.  She does not have any numbness/tingling.  She denies weakness, falls, or imbalance. MRI of the cervical spine, thoracic spine, and lumbar spine did not show any significant findings to explain her symptoms.   She saw rheumatology last week and was diagnosed with polymyalgia rheumatica and started on prednisone taper and plaquenil.  She has relief with prednisone and currently on 36m/d.   She lives alone and is retired from [employer], educationalwork.   Out-side paper records, electronic medical record, and images have been reviewed where available and summarized as:  Labs/Imaging Reviewed in EMR Normal CMP and CBC CK 39 TSH 0.53 ESR 16; CRP < 1 Pos: RF Neg: ANA, B burgdorferi  MRI L Spine (02/2020): 1. No neural impingement or other specific cause of leg symptoms.  2. Lower lumbar facet osteoarthritis with mild progression from 2007. Mild anterolisthesis at L4-5 has developed since prior.  3. Prior decompression at L5-S1.  No recurrent stenosis.  4.  Transitional S1 vertebra.   MRI thoracic spine 08/14/2020 : Unremarkable  MRI cervical spine wo contrast 08/14/2020:  Prior ACFD C5-6 without residual or recurrent stenosis Mild degenerative spondylosis at C3-4, C4-5, and C6-7 without signficant stenosis.  Stable Mild marrow edema about the distal spinous process of C7, nonspecific but could reflect changes of acute enthesopathy.   Lab Results  Component Value Date   TSH 0.53 08/24/2020   Lab Results  Component Value Date   ESRSEDRATE 16 08/25/2020    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.0)    Osteopenia    Seborrheic dermatitis, unspecified    Vaginal cyst    stable    Past Surgical History:  Procedure Laterality Date   AIR/FLUID EXCHANGE Right 07/25/2018   Procedure: Air/Fluid Exchange;  Surgeon: PJalene Mullet MD;  [facility] [facility]  Service: Ophthalmology;  Laterality: Right;   bone spur removal  10/06   shoulder   CATARACT EXTRACTION, BILATERAL Bilateral 2016   [facility]  [facility]   Chiari Malformation surgery  6/03   EXCISION MORTON'S NEUROMA  2/04   GAS INSERTION Right 07/25/2018   Procedure: Insertion Of Gas SF6;  Surgeon: PJalene Mullet MD;  [facility]  Service: Ophthalmology;  Laterality: Right;  LUMBAR DISC SURGERY     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   Procedure: REPAIR OF COMPLEX TRACTION RETINAL DETACHMENT - 25GA VITRECTOMY, ENDOLASER, DRAINAGE OF SUBRETINAL FLUID;  Surgeon: Jalene Mullet, MD;  Location: [facility];  Service: Ophthalmology;  Laterality: Right;   TUBAL LIGATION       Medications:  Outpatient Encounter Medications as of 10/19/2020  Medication Sig   amLODipine (NORVASC) 5 MG tablet Take  1 tablet (5 mg total) by mouth daily.   DENTA 5000 PLUS 1.1 % CREA dental cream Take 1 application by mouth at bedtime.    levothyroxine (SYNTHROID) 50 MCG tablet TAKE 1 TABLET BY MOUTH DAILY BEFORE BREAKFAST   LORazepam (ATIVAN) 1 MG tablet TAKE 1/2 (ONE-HALF) TABLET BY MOUTH AT BEDTIME AS NEEDED   meclizine (ANTIVERT) 25 MG tablet Take 1 tablet (25 mg total) by mouth 3 (three) times daily as needed for dizziness.   metoprolol succinate (TOPROL-XL) 25 MG 24 hr tablet Take 0.5 tablets (12.5 mg total) by mouth daily.   mometasone (ELOCON) 0.1 % ointment Apply topically daily. Apply to itchy areas on the back bid prn   mupirocin ointment (BACTROBAN) 2 % Apply to each nostril area daily   predniSONE (DELTASONE) 5 MG tablet Take 1 tablet (5 mg total) by mouth 2 (two) times daily with a meal.   rivaroxaban (XARELTO) 20 MG TABS tablet Take 1 tablet (20 mg total) by mouth daily with supper.   Tavaborole (KERYDIN) 5 % SOLN Apply to toenails at bedtme   hydroxychloroquine (PLAQUENIL) 200 MG tablet Take 200 mg by mouth 2 (two) times daily.   No facility-administered encounter medications on file as of 10/19/2020.    Allergies:  Allergies  Allergen Reactions   Buspar [Buspirone]     Felt funny   Calcium     REACTION: cannot tolerate any calcium supplement - GI sympotms   Fluticasone Propionate     REACTION: headache/irritation   Hctz [Hydrochlorothiazide] Other (See Comments)    Eye redness and discomfort/ slt blurred vision    Paroxetine     REACTION: abdominal pain and constipation   Zoloft [Sertraline]     Diarrhea     Family History: Family History  Problem Relation Age of Onset   Melanoma Sister    Lung cancer Father    Coronary artery disease Mother    Heart failure Mother    Hypertension Mother    Anxiety disorder Son    Lung cancer Brother        + smoker   Breast cancer Neg Hx     Social History: Social History   Tobacco Use   Smoking status: Former    Types:  Cigarettes    Quit date: 01/17/2001    Years since quitting: 19.7   Smokeless tobacco: Never  Substance Use Topics   Alcohol use: No    Alcohol/week: 0.0 standard drinks   Drug use: No   Social History   Social History Narrative   Walks daily for exercise      Married--husband diagnosed with stomach cancer (5/11)   Lost her husband 4/12 to cancer     Vital Signs:  BP 127/64   Pulse 71   Ht '5\' 6"'  (1.676 m)   Wt 152 lb 12.8 oz (69.3 kg)   SpO2 99%   BMI 24.66 kg/m   Neurological Exam: MENTAL STATUS including orientation to time, place, person, recent and remote memory, attention span and concentration, language, and fund of  knowledge is normal.  Speech is not dysarthric.  CRANIAL NERVES: II:  No visual field defects.   III-IV-VI: Pupils equal round and reactive to light.  Normal conjugate, extra-ocular eye movements in all directions of gaze.  No nystagmus.  No ptosis.   V:  Normal facial sensation.    VII:  Normal facial symmetry and movements.   VIII:  Normal hearing and vestibular function.   IX-X:  Normal palatal movement.   XI:  Normal shoulder shrug and head rotation.   XII:  Normal tongue strength and range of motion, no deviation or fasciculation.  MOTOR:  No atrophy, fasciculations or abnormal movements.  No pronator drift.   Upper Extremity:  Right  Left  Deltoid  5/5   5/5   Biceps  5/5   5/5   Triceps  5/5   5/5   Infraspinatus 5/5  5/5  Medial pectoralis 5/5  5/5  Wrist extensors  5/5   5/5   Wrist flexors  5/5   5/5   Finger extensors  5/5   5/5   Finger flexors  5/5   5/5   Dorsal interossei  5/5   5/5   Abductor pollicis  5/5   5/5   Tone (Ashworth scale)  0  0   Lower Extremity:  Right  Left  Hip flexors  5/5   5/5   Hip extensors  5/5   5/5   Adductor 5/5  5/5  Abductor 5/5  5/5  Knee flexors  5/5   5/5   Knee extensors  5/5   5/5   Dorsiflexors  5/5   5/5   Plantarflexors  5/5   5/5   Toe extensors  5/5   5/5   Toe flexors  5/5   5/5    Tone (Ashworth scale)  0  0   MSRs:  Right        Left                  brachioradialis 2+  2+  biceps 2+  2+  triceps 2+  2+  patellar 2+  2+  ankle jerk 2+  2+  Hoffman no  no  plantar response down  down   SENSORY:  Normal and symmetric perception of light touch, pinprick, vibration, and proprioception.  Romberg's sign absent.   COORDINATION/GAIT: Normal finger-to- nose-finger and heel-to-shin.  Intact rapid alternating movements bilaterally.  Able to rise from a chair without using arms.  Gait narrow based and stable. Tandem and stressed gait intact.    IMPRESSION: Polyarthragia and myalgia, due to newly diagnosed polymyalgia rheumatica.  Her history does not favor neurological pathology and her exam is entirely normal. Patient reassured that I do not see any underlying neurological condition.  She will continue to follow-up with rheumatology.   Return to clinic as needed   Thank you for allowing me to participate in patient's care.  If I can answer any additional questions, I would be pleased to do so.    Sincerely,    Orrie Lascano K. Posey Pronto, DO

## 2020-10-19 NOTE — Patient Instructions (Signed)
Follow up with Rheumatology

## 2020-10-25 ENCOUNTER — Other Ambulatory Visit: Payer: Self-pay | Admitting: Family Medicine

## 2020-10-26 NOTE — Telephone Encounter (Signed)
Name of Medication: ativan Name of Pharmacy: [facility] [address] Last Fill or Written Date and Quantity: 04/22/20 #15 tabs with 3 refills Last Office Visit and Type:  CPE on 08/31/20 Next Office Visit and Type: none scheduled

## 2020-11-18 DIAGNOSIS — M353 Polymyalgia rheumatica: Secondary | ICD-10-CM | POA: Diagnosis not present

## 2020-11-18 DIAGNOSIS — Z7960 Long term (current) use of unspecified immunomodulators and immunosuppressants: Secondary | ICD-10-CM | POA: Diagnosis not present

## 2020-11-19 DIAGNOSIS — D6869 Other thrombophilia: Secondary | ICD-10-CM | POA: Diagnosis not present

## 2020-11-19 DIAGNOSIS — D84821 Immunodeficiency due to drugs: Secondary | ICD-10-CM | POA: Diagnosis not present

## 2020-11-19 DIAGNOSIS — Z008 Encounter for other general examination: Secondary | ICD-10-CM | POA: Diagnosis not present

## 2020-11-19 DIAGNOSIS — I10 Essential (primary) hypertension: Secondary | ICD-10-CM | POA: Diagnosis not present

## 2020-11-19 DIAGNOSIS — Z7901 Long term (current) use of anticoagulants: Secondary | ICD-10-CM | POA: Diagnosis not present

## 2020-11-19 DIAGNOSIS — Z87891 Personal history of nicotine dependence: Secondary | ICD-10-CM | POA: Diagnosis not present

## 2020-11-19 DIAGNOSIS — Z79899 Other long term (current) drug therapy: Secondary | ICD-10-CM | POA: Diagnosis not present

## 2020-11-19 DIAGNOSIS — E039 Hypothyroidism, unspecified: Secondary | ICD-10-CM | POA: Diagnosis not present

## 2020-11-19 DIAGNOSIS — Z809 Family history of malignant neoplasm, unspecified: Secondary | ICD-10-CM | POA: Diagnosis not present

## 2020-11-19 DIAGNOSIS — M353 Polymyalgia rheumatica: Secondary | ICD-10-CM | POA: Diagnosis not present

## 2020-11-19 DIAGNOSIS — Z7952 Long term (current) use of systemic steroids: Secondary | ICD-10-CM | POA: Diagnosis not present

## 2020-11-19 DIAGNOSIS — Z7722 Contact with and (suspected) exposure to environmental tobacco smoke (acute) (chronic): Secondary | ICD-10-CM | POA: Diagnosis not present

## 2020-11-19 DIAGNOSIS — Z8249 Family history of ischemic heart disease and other diseases of the circulatory system: Secondary | ICD-10-CM | POA: Diagnosis not present

## 2020-11-19 DIAGNOSIS — I4891 Unspecified atrial fibrillation: Secondary | ICD-10-CM | POA: Diagnosis not present

## 2020-11-19 DIAGNOSIS — R69 Illness, unspecified: Secondary | ICD-10-CM | POA: Diagnosis not present

## 2020-11-23 DIAGNOSIS — R21 Rash and other nonspecific skin eruption: Secondary | ICD-10-CM | POA: Diagnosis not present

## 2020-12-30 DIAGNOSIS — Z7960 Long term (current) use of unspecified immunomodulators and immunosuppressants: Secondary | ICD-10-CM | POA: Diagnosis not present

## 2020-12-30 DIAGNOSIS — Z7952 Long term (current) use of systemic steroids: Secondary | ICD-10-CM | POA: Diagnosis not present

## 2020-12-30 DIAGNOSIS — M353 Polymyalgia rheumatica: Secondary | ICD-10-CM | POA: Diagnosis not present

## 2021-01-12 NOTE — Progress Notes (Signed)
Primary Care Physician: Tower, Wynelle Fanny, MD Primary Cardiologist: none Primary Electrophysiologist: none Referring Physician: [facility]   Denise Grant is a 65 y.o. female with a history of Chiari malformation, polymyalgia rheumatica, HTN, hypothyroidism, MVP, generalized anxiety, and paroxysmal atrial fibrillation who presents for follow up in the [facility]. The patient was initially diagnosed with atrial fibrillation on 09/19/18 after presenting to the ER with symptoms of SOB, chest discomfort, and weakness. She was found to be in afib with RVR and underwent successful DCCV. There were no specific triggers that the patient could identify. She denies snoring or significant alcohol use. She had an echo 09/26/18 which showed preserved EF and a Zio patch 10/01/18 which showed no afib. Patient presented to the ED on 10/12/19 with symptoms of heart racing, nausea, and SOB. ECG showed afib with RVR. She underwent DCCV at that time.  On follow up today, patient reports that she has done well since her last visit. She has recently been diagnosed with RA and polymyalgia rheumatica and is on methotrexate and prednisone. No episodes of afib on her smart watch. She denies any bleeding issues on anticoagulation.   Today, she denies symptoms of palpitations, chest pain, shortness of breath, orthopnea, PND, lower extremity edema, dizziness, presyncope, syncope, snoring, daytime somnolence, bleeding, or neurologic sequela. The patient is tolerating medications without difficulties and is otherwise without complaint today.    Atrial Fibrillation Risk Factors:  she does not have symptoms or diagnosis of sleep apnea. she does not have a history of rheumatic fever. she does not have a history of alcohol use. The patient does have a history of early familial atrial fibrillation or other arrhythmias. Sister has afib, brother has PPM.  she has a BMI of Body mass index is 25.11  kg/m.Marland Kitchen Filed Weights   01/13/21 0932  Weight: 70.6 kg      Family History  Problem Relation Age of Onset   Melanoma Sister    Lung cancer Father    Coronary artery disease Mother    Heart failure Mother    Hypertension Mother    Anxiety disorder Son    Lung cancer Brother        + smoker   Breast cancer Neg Hx      Atrial Fibrillation Management history:  Previous antiarrhythmic drugs: none Previous cardioversions: 09/19/18, 10/12/19 Previous ablations: none CHADS2VASC score: 3 Anticoagulation history: Xarelto   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.0)    Osteopenia    Seborrheic dermatitis, unspecified    Vaginal cyst    stable   Past Surgical History:  Procedure Laterality Date   AIR/FLUID EXCHANGE Right 07/25/2018   Procedure: Air/Fluid Exchange;  Surgeon: Jalene Mullet, MD;  Location: [facility];  Service: Ophthalmology;  Laterality: Right;   bone spur removal  10/06   shoulder   CATARACT EXTRACTION, BILATERAL Bilateral 2016   CERVICAL [place] [facility]  2005   Chiari Malformation surgery  6/03   EXCISION MORTON'S NEUROMA  2/04   GAS INSERTION Right 07/25/2018   Procedure: Insertion Of Gas SF6;  Surgeon: Jalene Mullet, MD;  Location: [facility];  Service: Ophthalmology;  Laterality:  Right;   LUMBAR DISC SURGERY     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   Procedure: REPAIR OF COMPLEX TRACTION RETINAL DETACHMENT - 25GA VITRECTOMY, ENDOLASER, DRAINAGE OF SUBRETINAL FLUID;  Surgeon: Jalene Mullet, MD;  Location: [facility];  Service: Ophthalmology;  Laterality: Right;   TUBAL LIGATION      Current Outpatient Medications  Medication Sig Dispense Refill   amLODipine (NORVASC) 5 MG tablet  Take 1 tablet (5 mg total) by mouth daily. 90 tablet 3   DENTA 5000 PLUS 1.1 % CREA dental cream Take 1 application by mouth at bedtime.      folic acid (FOLVITE) 1 MG tablet Take 1 mg by mouth daily.     levothyroxine (SYNTHROID) 50 MCG tablet TAKE 1 TABLET BY MOUTH DAILY BEFORE BREAKFAST 90 tablet 3   LORazepam (ATIVAN) 1 MG tablet TAKE A HALF TABLET BY MOUTH AT BEDTIME AS NEEDED 15 tablet 3   meclizine (ANTIVERT) 25 MG tablet Take 1 tablet (25 mg total) by mouth 3 (three) times daily as needed for dizziness. 15 tablet 0   methotrexate (RHEUMATREX) 2.5 MG tablet SMARTSIG:5 Tablet(s) By Mouth Once a Week     metoprolol succinate (TOPROL-XL) 25 MG 24 hr tablet Take 0.5 tablets (12.5 mg total) by mouth daily. 45 tablet 3   mometasone (ELOCON) 0.1 % ointment Apply topically daily. Apply to itchy areas on the back bid prn 45 g 1   mupirocin ointment (BACTROBAN) 2 % Apply to each nostril area daily 22 g 2   predniSONE (DELTASONE) 5 MG tablet Take 1 tablet (5 mg total) by mouth 2 (two) times daily with a meal. (Patient taking differently: Take 7.5 mg by mouth 2 (two) times daily with a meal.) 60 tablet 1   rivaroxaban (XARELTO) 20 MG TABS tablet Take 1 tablet (20 mg total) by mouth daily with supper. 90 tablet 2   Tavaborole (KERYDIN) 5 % SOLN Apply to toenails at bedtme 4 mL 2   No current facility-administered medications for this encounter.    Allergies  Allergen Reactions   Buspar [Buspirone]     Felt funny   Calcium     REACTION: cannot tolerate any calcium supplement - GI sympotms   Fluticasone Propionate     REACTION: headache/irritation   Hctz [Hydrochlorothiazide] Other (See Comments)    Eye redness and discomfort/ slt blurred vision    Paroxetine     REACTION: abdominal pain and constipation   Zoloft [Sertraline]     Diarrhea    Hydroxychloroquine Itching and Rash    Social History   Socioeconomic History   Marital status: Widowed    Spouse name: Not on file   Number of  children: Not on file   Years of education: Not on file   Highest education level: Not on file  Occupational History   Not on file  Tobacco Use   Smoking status: Former    Types: Cigarettes    Quit date: 01/17/2001    Years since quitting: 20.0   Smokeless tobacco: Never   Tobacco comments:    Former smoker 01/13/2021  Substance and Sexual Activity   Alcohol use: No    Alcohol/week: 0.0 standard drinks   Drug use: No   Sexual activity: Not on file  Other Topics Concern   Not on file  Social History Narrative   Walks daily for exercise      Married--husband diagnosed with stomach cancer (5/11)   Lost her husband 4/12 to  cancer    Social Determinants of Health   Financial Resource Strain: Not on file  Food Insecurity: Not on file  Transportation Needs: Not on file  Physical Activity: Not on file  Stress: Not on file  Social Connections: Not on file  Intimate Partner Violence: Not on file     ROS- All systems are reviewed and negative except as per the HPI above.  Physical Exam: Vitals:   01/13/21 0932  BP: 122/68  Pulse: 67  Weight: 70.6 kg  Height: 5\' 6"  (1.676 m)    GEN- The patient is a well appearing female, alert and oriented x 3 today.   HEENT-head normocephalic, atraumatic, sclera clear, conjunctiva pink, hearing intact, trachea midline. Lungs- Clear to ausculation bilaterally, normal work of breathing Heart- Regular rate and rhythm, no murmurs, rubs or gallops  GI- soft, NT, ND, + BS Extremities- no clubbing, cyanosis, or edema MS- no significant deformity or atrophy Skin- no rash or lesion Psych- euthymic mood, full affect Neuro- strength and sensation are intact   Wt Readings from Last 3 Encounters:  01/13/21 70.6 kg  10/19/20 69.3 kg  08/31/20 67.7 kg    EKG today demonstrates  SR Vent. rate 67 BPM PR interval 132 ms QRS duration 78 ms QT/QTcB 380/401 ms  Echo 09/26/18 1. The left ventricle has normal systolic function with an ejection  fraction of 60-65%. The cavity size was normal. Left ventricular diastolic parameters were normal. No evidence of left ventricular regional wall motion abnormalities.  2. The right ventricle has normal systolic function. The cavity was normal. There is no increase in right ventricular wall thickness. Right ventricular systolic pressure is normal with an estimated pressure of 21.8 mmHg.  3. The pericardial effusion is circumferential.  4. Trivial pericardial effusion is present.  5. Mild thickening of the mitral valve leaflet. There is mild mitral annular calcification present.  6. Mild thickening of the aortic valve. Aortic valve regurgitation is mild by color flow Doppler.  7. The aorta is normal unless otherwise noted.  Epic records are reviewed at length today   CHA2DS2-VASc Score = 3  The patient's score is based upon: CHF History: 0 HTN History: 1 Diabetes History: 0 Stroke History: 0 Vascular Disease History: 0 Age Score: 1 Gender Score: 1         ASSESSMENT AND PLAN: 1. Paroxysmal Atrial Fibrillation (ICD10:  I48.0) The patient's CHA2DS2-VASc score is 3, indicating a 3.2% annual risk of stroke.   Patient appears to be maintaining SR. Smart watch for home monitoring.  Continue Toprol 12.5 mg daily Continue Xarelto 20 mg daily  2. Secondary Hypercoagulable State (ICD10:  D68.69) The patient is at significant risk for stroke/thromboembolism based upon her CHA2DS2-VASc Score of 3.  Continue Rivaroxaban (Xarelto).   3. HTN Stable, no changes today.   Follow up in the AF clinic in 6 months.    [facility] [address] [place], [place] [address] [phone] 01/13/2021 9:39 AM

## 2021-01-13 ENCOUNTER — Ambulatory Visit (HOSPITAL_COMMUNITY)
Admission: RE | Admit: 2021-01-13 | Discharge: 2021-01-13 | Disposition: A | Discharge status: Home / Self Care | Payer: Medicare HMO | Source: Ambulatory Visit | Attending: Physician Assistant | Admitting: Physician Assistant

## 2021-01-13 ENCOUNTER — Encounter (HOSPITAL_COMMUNITY): Payer: Self-pay | Admitting: Physician Assistant

## 2021-01-13 ENCOUNTER — Other Ambulatory Visit: Payer: Self-pay

## 2021-01-13 VITALS — BP 122/68 | HR 67 | Ht 66.0 in | Wt 155.6 lb

## 2021-01-13 DIAGNOSIS — I480 Paroxysmal atrial fibrillation: Secondary | ICD-10-CM

## 2021-01-13 DIAGNOSIS — F411 Generalized anxiety disorder: Secondary | ICD-10-CM | POA: Insufficient documentation

## 2021-01-13 DIAGNOSIS — R69 Illness, unspecified: Secondary | ICD-10-CM | POA: Diagnosis not present

## 2021-01-13 DIAGNOSIS — Z87798 Personal history of other (corrected) congenital malformations: Secondary | ICD-10-CM | POA: Insufficient documentation

## 2021-01-13 DIAGNOSIS — I341 Nonrheumatic mitral (valve) prolapse: Secondary | ICD-10-CM | POA: Insufficient documentation

## 2021-01-13 DIAGNOSIS — I10 Essential (primary) hypertension: Secondary | ICD-10-CM | POA: Diagnosis not present

## 2021-01-13 DIAGNOSIS — Z7901 Long term (current) use of anticoagulants: Secondary | ICD-10-CM | POA: Diagnosis not present

## 2021-01-13 DIAGNOSIS — E039 Hypothyroidism, unspecified: Secondary | ICD-10-CM | POA: Insufficient documentation

## 2021-01-13 DIAGNOSIS — M353 Polymyalgia rheumatica: Secondary | ICD-10-CM | POA: Insufficient documentation

## 2021-01-13 DIAGNOSIS — Z79899 Other long term (current) drug therapy: Secondary | ICD-10-CM | POA: Diagnosis not present

## 2021-01-13 DIAGNOSIS — D6869 Other thrombophilia: Secondary | ICD-10-CM | POA: Diagnosis not present

## 2021-01-23 ENCOUNTER — Other Ambulatory Visit: Payer: Self-pay | Admitting: Family Medicine

## 2021-01-26 NOTE — Telephone Encounter (Signed)
Refill request Prednisone See allergy/contraindication Last office visit 08/31/20 Last refill 09/17/20 #60/1

## 2021-01-26 NOTE — Telephone Encounter (Signed)
Spoke to patient by telephone and was advised that this does come from her rheumatologist now. Patient stated that she does not know why they sent the refill request to Dr. Glori Bickers. Advised patient that we will let the pharmacy know that they need to send this to her rheumatologist.

## 2021-02-16 DIAGNOSIS — M353 Polymyalgia rheumatica: Secondary | ICD-10-CM | POA: Diagnosis not present

## 2021-02-16 DIAGNOSIS — Z7960 Long term (current) use of unspecified immunomodulators and immunosuppressants: Secondary | ICD-10-CM | POA: Diagnosis not present

## 2021-02-17 ENCOUNTER — Other Ambulatory Visit (HOSPITAL_COMMUNITY): Payer: Self-pay | Admitting: Physician Assistant

## 2021-03-08 DIAGNOSIS — H338 Other retinal detachments: Secondary | ICD-10-CM | POA: Diagnosis not present

## 2021-03-08 DIAGNOSIS — H35373 Puckering of macula, bilateral: Secondary | ICD-10-CM | POA: Diagnosis not present

## 2021-03-08 DIAGNOSIS — H59811 Chorioretinal scars after surgery for detachment, right eye: Secondary | ICD-10-CM | POA: Diagnosis not present

## 2021-03-08 DIAGNOSIS — H31092 Other chorioretinal scars, left eye: Secondary | ICD-10-CM | POA: Diagnosis not present

## 2021-03-08 DIAGNOSIS — H35342 Macular cyst, hole, or pseudohole, left eye: Secondary | ICD-10-CM | POA: Diagnosis not present

## 2021-03-10 ENCOUNTER — Encounter: Payer: Medicare HMO | Admitting: Dermatology

## 2021-03-15 ENCOUNTER — Ambulatory Visit (INDEPENDENT_AMBULATORY_CARE_PROVIDER_SITE_OTHER): Payer: Medicare HMO | Admitting: Family Medicine

## 2021-03-15 ENCOUNTER — Encounter: Payer: Self-pay | Admitting: Family Medicine

## 2021-03-15 ENCOUNTER — Other Ambulatory Visit: Payer: Self-pay

## 2021-03-15 DIAGNOSIS — H6122 Impacted cerumen, left ear: Secondary | ICD-10-CM

## 2021-03-15 DIAGNOSIS — H6120 Impacted cerumen, unspecified ear: Secondary | ICD-10-CM | POA: Insufficient documentation

## 2021-03-15 NOTE — Patient Instructions (Signed)
Ear looks after irrigation   Use debrox weekly to every other week  It helps keep the wax loose   If hearing is not back to normal tomorrow alert [place]  If pain also

## 2021-03-15 NOTE — Assessment & Plan Note (Signed)
Left side, causing reduced hearing and feeling of fullness  Pt used debrox prior  After consent obtained- simple irrigation done with success Relief and restoration of hearing -pt pleased and tol well  inst to use debrox proph 2-4 times per mo  Continue to monitor

## 2021-03-15 NOTE — Progress Notes (Signed)
Subjective:    Patient ID: Denise Grant, female    DOB: Dec 01, 1955, 66 y.o.   MRN: [id number]  This visit occurred during the SARS-CoV-2 public health emergency.  Safety protocols were in place, including screening questions prior to the visit, additional usage of staff PPE, and extensive cleaning of exam room while observing appropriate contact time as indicated for disinfecting solutions.   HPI Pt presents for c/o ear pain   Wt Readings from Last 3 Encounters:  03/15/21 158 lb (71.7 kg)  01/13/21 155 lb 9.6 oz (70.6 kg)  10/19/20 152 lb 12.8 oz (69.3 kg)   24.75 kg/m  Left ear  Symptoms started 2/24 Ear feels full  Popping at times (also feels like head is in a drum)  Hearing is less on the L   Uses debrox ear drops  A little allergy problem-takes zyrtec   No St  No fever    Patient Active Problem List   Diagnosis Date Noted   Medicare annual wellness visit, initial 08/31/2020   Rheumatoid factor positive 08/28/2020   Pain, joint, multiple sites 08/25/2020   Myalgia 08/25/2020   Secondary hypercoagulable state ([place]) 07/05/2019   Localized swelling, mass, or lump of lower extremity 05/13/2019   Lumbar disc disease    Bilateral bunions    Paroxysmal atrial fibrillation ([place]) 09/27/2018   Fatigue 09/27/2018   Essential hypertension 01/01/2015   Hypothyroidism 06/25/2014   Migraine with vertigo 05/21/2014   History of Chiari malformation 05/21/2014   Atrophic vaginitis 05/01/2013   Colon cancer screening 04/20/2012   Encounter for routine gynecological examination 11/24/2010   Routine general medical examination at a health care facility 11/18/2010   INSOMNIA 01/27/2010   HEADACHE 03/31/2008   Osteopenia 09/18/2007   Generalized anxiety disorder 05/05/2006   MITRAL VALVE PROLAPSE 05/05/2006   ALLERGIC RHINITIS 05/05/2006   FIBROCYSTIC BREAST DISEASE 05/05/2006   SEBORRHEIC DERMATITIS 05/05/2006   KERATOSIS, ACTINIC 05/05/2006   NECK PAIN, CHRONIC  05/05/2006   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.0)    Osteopenia    Seborrheic dermatitis, unspecified    Vaginal cyst    stable   Past Surgical History:  Procedure Laterality Date   AIR/FLUID EXCHANGE Right 07/25/2018   Procedure: Air/Fluid Exchange;  Surgeon: Jalene Mullet, MD;  Location: [facility];  Service: Ophthalmology;  Laterality: Right;   bone spur removal  10/06   shoulder   CATARACT EXTRACTION, BILATERAL Bilateral 2016   CERVICAL [place] SURGERY  2005   Chiari Malformation surgery  6/03   EXCISION [facility]'S NEUROMA  2/04   GAS INSERTION Right 07/25/2018   Procedure: Insertion Of Gas SF6;  Surgeon: Jalene Mullet, MD;  Location: [facility];  Service: Ophthalmology;  Laterality: Right;   LUMBAR DISC SURGERY     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   Procedure: REPAIR OF COMPLEX TRACTION RETINAL DETACHMENT - 25GA VITRECTOMY, ENDOLASER, DRAINAGE OF SUBRETINAL FLUID;  Surgeon: Jalene Mullet, MD;  Location: [facility];  Service: Ophthalmology;  Laterality: Right;   TUBAL LIGATION     Social History   Tobacco Use   Smoking status: Former    Types: Cigarettes    Quit date:  01/17/2001    Years since quitting: 20.1    Passive exposure: Past   Smokeless tobacco: Never   Tobacco comments:    Former smoker 01/13/2021  Substance Use Topics   Alcohol use: No    Alcohol/week: 0.0 standard drinks   Drug use: No   Family History  Problem Relation Age of Onset   Melanoma Sister    Lung cancer Father    Coronary artery disease Mother    Heart failure Mother    Hypertension Mother    Anxiety disorder Son    Lung cancer Brother        + smoker   Breast cancer Neg Hx    Allergies  Allergen  Reactions   Buspar [Buspirone]     Felt funny   Calcium     REACTION: cannot tolerate any calcium supplement - GI sympotms   Fluticasone Propionate     REACTION: headache/irritation   Hctz [Hydrochlorothiazide] Other (See Comments)    Eye redness and discomfort/ slt blurred vision    Paroxetine     REACTION: abdominal pain and constipation   Zoloft [Sertraline]     Diarrhea    Hydroxychloroquine Itching and Rash   Current Outpatient Medications on File Prior to Visit  Medication Sig Dispense Refill   amLODipine (NORVASC) 5 MG tablet Take 1 tablet (5 mg total) by mouth daily. 90 tablet 3   DENTA 5000 PLUS 1.1 % CREA dental cream Take 1 application by mouth at bedtime.      levothyroxine (SYNTHROID) 50 MCG tablet TAKE 1 TABLET BY MOUTH DAILY BEFORE BREAKFAST 90 tablet 3   LORazepam (ATIVAN) 1 MG tablet TAKE A HALF TABLET BY MOUTH AT BEDTIME AS NEEDED 15 tablet 3   metoprolol succinate (TOPROL-XL) 25 MG 24 hr tablet Take 0.5 tablets (12.5 mg total) by mouth daily. 45 tablet 3   mometasone (ELOCON) 0.1 % ointment Apply topically daily. Apply to itchy areas on the back bid prn 45 g 1   predniSONE (DELTASONE) 5 MG tablet Take 1 tablet (5 mg total) by mouth 2 (two) times daily with a meal. (Patient taking differently: Take 7.5 mg by mouth 2 (two) times daily with a meal.) 60 tablet 1   Tavaborole (KERYDIN) 5 % SOLN Apply to toenails at bedtme 4 mL 2   XARELTO 20 MG TABS tablet TAKE 1 TABLET BY MOUTH DAILY WITH SUPPER. 90 tablet 2   meclizine (ANTIVERT) 25 MG tablet Take 1 tablet (25 mg total) by mouth 3 (three) times daily as needed for dizziness. (Patient not taking: Reported on 03/15/2021) 15 tablet 0   No current facility-administered medications on file prior to visit.    Review of Systems  Constitutional:  Negative for activity change, appetite change, fatigue, fever and unexpected weight change.  HENT:  Positive for hearing loss, postnasal drip and rhinorrhea. Negative for congestion,  ear discharge, ear pain, sinus pressure and sore throat.        L ear fullness More difficult to hear out of L   Eyes:  Negative for pain, redness and visual disturbance.  Respiratory:  Negative for cough, shortness of breath and wheezing.   Cardiovascular:  Negative for chest pain and palpitations.  Gastrointestinal:  Negative for abdominal pain, blood in stool, constipation and diarrhea.  Endocrine: Negative for polydipsia and polyuria.  Genitourinary:  Negative for dysuria, frequency and urgency.  Musculoskeletal:  Negative for arthralgias, back pain and myalgias.  Skin:  Negative for pallor and rash.  Allergic/Immunologic:  Negative for environmental allergies.  Neurological:  Negative for dizziness, syncope and headaches.  Hematological:  Negative for adenopathy. Does not bruise/bleed easily.  Psychiatric/Behavioral:  Negative for decreased concentration and dysphoric mood. The patient is not nervous/anxious.       Objective:   Physical Exam Constitutional:      Appearance: Normal appearance. She is normal weight. She is not ill-appearing.  HENT:     Right Ear: Tympanic membrane, ear canal and external ear normal.     Left Ear: There is impacted cerumen.     Ears:     Comments: TM and canal look nl /clear after simple irrigation of L ear    Nose: Nose normal.  Eyes:     General:        Right eye: No discharge.        Left eye: No discharge.     Conjunctiva/sclera: Conjunctivae normal.     Pupils: Pupils are equal, round, and reactive to light.  Cardiovascular:     Rate and Rhythm: Normal rate.  Pulmonary:     Effort: Pulmonary effort is normal. No respiratory distress.     Breath sounds: Normal breath sounds.  Musculoskeletal:     Cervical back: Normal range of motion and neck supple.  Lymphadenopathy:     Cervical: No cervical adenopathy.  Skin:    General: Skin is warm and dry.     Findings: Rash present. No erythema.     Comments: Excoriation /old ecchymosis from  scratching L arm  No signs of infection   Neurological:     Mental Status: She is alert.     Cranial Nerves: No cranial nerve deficit.     Coordination: Coordination normal.  Psychiatric:        Mood and Affect: Mood normal.          Assessment & Plan:   Problem List Items Addressed This Visit       Nervous and Auditory   Cerumen impaction    Left side, causing reduced hearing and feeling of fullness  Pt used debrox prior  After consent obtained- simple irrigation done with success Relief and restoration of hearing -pt pleased and tol well  inst to use debrox proph 2-4 times per mo  Continue to monitor

## 2021-03-23 ENCOUNTER — Encounter: Payer: Self-pay | Admitting: Dermatology

## 2021-03-23 ENCOUNTER — Other Ambulatory Visit: Payer: Self-pay

## 2021-03-23 ENCOUNTER — Ambulatory Visit: Payer: Medicare HMO | Admitting: Dermatology

## 2021-03-23 DIAGNOSIS — R21 Rash and other nonspecific skin eruption: Secondary | ICD-10-CM

## 2021-03-23 MED ORDER — HYDROXYZINE HCL 25 MG PO TABS: ORAL_TABLET | ORAL | 0 refills | Status: DC

## 2021-03-23 NOTE — Patient Instructions (Signed)

## 2021-03-23 NOTE — Progress Notes (Signed)
? ?  Follow-Up Visit ?  ?Subjective  ?Denise Grant is a 66 y.o. female who presents for the following: Rash (On the B/L ankles - start yesterday after sun exposure very itchy. A similar rash occurred last week on the chest and back after sun exposure. Patient has started no new medications, recently been ill, or hand changes in soaps, lotions, etc. Patient has tried Zyrtec and Allegra and topical Mometasone ). Pt currently on Prednisone 7.5 mg po QD for polymyalgia rheumatica. Patient also had a similar rash when she had a reaction to Plaquenil. ? ?The following portions of the chart were reviewed this encounter and updated as appropriate:  ? Tobacco  Allergies  Meds  Problems  Med Hx  Surg Hx  Fam Hx   ?  ?Review of Systems:  No other skin or systemic complaints except as noted in HPI or Assessment and Plan. ? ?Objective  ?Well appearing patient in no apparent distress; mood and affect are within normal limits. ? ?A focused examination was performed including the B/L leg, chest, arms . Relevant physical exam findings are noted in the Assessment and Plan. ? ?B/L ankles, chest ?Macular pinkness on the lower legs and chest. Pinkness on the upper arms.  ? ? ?Assessment & Plan  ?Rash - Photosensitive rash ?B/L ankles, chest ?With photodermatitis - May be related to polymyalgia rheumatica vs other ? ?Start Hydroxyzine '25mg'$  po QHS. May make drowsy.  ?Start Claritin QAM.  ?Start Mometasone to aa's QD-BID PRN.  ? ?Consider biopsy if flare or recurrent. (Biopsy not likely to be fruitful today as rash is resolving) ? ?Topical steroids (such as triamcinolone, fluocinolone, fluocinonide, mometasone, clobetasol, halobetasol, betamethasone, hydrocortisone) can cause thinning and lightening of the skin if they are used for too long in the same area. Your physician has selected the right strength medicine for your problem and area affected on the body. Please use your medication only as directed by your physician to  prevent side effects.  ? ?Patient to call and report condition in one week. If rash is persistent or recurrent consider biopsy.  ? ?Avoid sun exposure.  ? ?hydrOXYzine (ATARAX) 25 MG tablet - B/L ankles, chest ?Take one tab po QHS. May make drowsy. ? ?No follow-ups on file. ? ?I, Rudell Cobb, CMA, am acting as scribe for Sarina Ser, MD . ?Documentation: I have reviewed the above documentation for accuracy and completeness, and I agree with the above. ? ?Sarina Ser, MD ? ?

## 2021-03-24 ENCOUNTER — Encounter: Payer: Self-pay | Admitting: Dermatology

## 2021-03-28 ENCOUNTER — Other Ambulatory Visit: Payer: Self-pay | Admitting: Family Medicine

## 2021-04-12 ENCOUNTER — Ambulatory Visit: Payer: Medicare HMO | Admitting: Dermatology

## 2021-04-12 ENCOUNTER — Other Ambulatory Visit: Payer: Self-pay

## 2021-04-12 DIAGNOSIS — L578 Other skin changes due to chronic exposure to nonionizing radiation: Secondary | ICD-10-CM

## 2021-04-12 DIAGNOSIS — Z1283 Encounter for screening for malignant neoplasm of skin: Secondary | ICD-10-CM | POA: Diagnosis not present

## 2021-04-12 DIAGNOSIS — L820 Inflamed seborrheic keratosis: Secondary | ICD-10-CM

## 2021-04-12 DIAGNOSIS — D1800 Hemangioma unspecified site: Secondary | ICD-10-CM

## 2021-04-12 DIAGNOSIS — L821 Other seborrheic keratosis: Secondary | ICD-10-CM

## 2021-04-12 DIAGNOSIS — L570 Actinic keratosis: Secondary | ICD-10-CM

## 2021-04-12 DIAGNOSIS — L814 Other melanin hyperpigmentation: Secondary | ICD-10-CM

## 2021-04-12 DIAGNOSIS — L719 Rosacea, unspecified: Secondary | ICD-10-CM

## 2021-04-12 DIAGNOSIS — D229 Melanocytic nevi, unspecified: Secondary | ICD-10-CM

## 2021-04-12 DIAGNOSIS — I8393 Asymptomatic varicose veins of bilateral lower extremities: Secondary | ICD-10-CM

## 2021-04-12 NOTE — Patient Instructions (Signed)

## 2021-04-12 NOTE — Progress Notes (Signed)
? ?Follow-Up Visit ?  ?Subjective  ?Denise Grant is a 66 y.o. female who presents for the following: Annual Exam (Hx of AK's - check for new or persistent precancerous skin lesions today). The patient presents for Total-Body Skin Exam (TBSE) for skin cancer screening and mole check.  The patient has spots, moles and lesions to be evaluated, some may be new or changing. ? ?The following portions of the chart were reviewed this encounter and updated as appropriate:  ? Tobacco  Allergies  Meds  Problems  Med Hx  Surg Hx  Fam Hx   ?  ?Review of Systems:  No other skin or systemic complaints except as noted in HPI or Assessment and Plan. ? ?Objective  ?Well appearing patient in no apparent distress; mood and affect are within normal limits. ? ?A full examination was performed including scalp, head, eyes, ears, nose, lips, neck, chest, axillae, abdomen, back, buttocks, bilateral upper extremities, bilateral lower extremities, hands, feet, fingers, toes, fingernails, and toenails. All findings within normal limits unless otherwise noted below. ? ?Face ?Mild erythema.  ? ?Mid chest sternum ?Erythematous thin papules/macules with gritty scale.  ? ?R underside breast x 1 ?Erythematous stuck-on, waxy papule or plaque ? ? ?Assessment & Plan  ?Rosacea ?Face ? ?Rosacea is a chronic progressive skin condition usually affecting the face of adults, causing redness and/or acne bumps. It is treatable but not curable. It sometimes affects the eyes (ocular rosacea) as well. It may respond to topical and/or systemic medication and can flare with stress, sun exposure, alcohol, exercise and some foods.  Daily application of broad spectrum spf 30+ sunscreen to face is recommended to reduce flares. ? ?Recommend Elta MD moisturizer sun screen.  ? ? ?AK (actinic keratosis) ?Mid chest sternum ? ?Destruction of lesion - Mid chest sternum ?Complexity: simple   ?Destruction method: cryotherapy   ?Informed consent: discussed and consent  obtained   ?Timeout:  patient name, date of birth, surgical site, and procedure verified ?Lesion destroyed using liquid nitrogen: Yes   ?Region frozen until ice ball extended beyond lesion: Yes   ?Outcome: patient tolerated procedure well with no complications   ?Post-procedure details: wound care instructions given   ? ?Inflamed seborrheic keratosis ?R underside breast x 1 ? ?Destruction of lesion - R underside breast x 1 ?Complexity: simple   ?Destruction method: cryotherapy   ?Informed consent: discussed and consent obtained   ?Timeout:  patient name, date of birth, surgical site, and procedure verified ?Lesion destroyed using liquid nitrogen: Yes   ?Region frozen until ice ball extended beyond lesion: Yes   ?Outcome: patient tolerated procedure well with no complications   ?Post-procedure details: wound care instructions given   ? ?Skin cancer screening ? ?Lentigines ?- Scattered tan macules ?- Due to sun exposure ?- Benign-appearing, observe ?- Recommend daily broad spectrum sunscreen SPF 30+ to sun-exposed areas, reapply every 2 hours as needed. ?- Call for any changes ? ?Seborrheic Keratoses ?- Stuck-on, waxy, tan-brown papules and/or plaques  ?- Benign-appearing ?- Discussed benign etiology and prognosis. ?- Observe ?- Call for any changes ? ?Melanocytic Nevi ?- Tan-brown and/or pink-flesh-colored symmetric macules and papules ?- Benign appearing on exam today ?- Observation ?- Call clinic for new or changing moles ?- Recommend daily use of broad spectrum spf 30+ sunscreen to sun-exposed areas.  ? ?Hemangiomas ?- Red papules ?- Discussed benign nature ?- Observe ?- Call for any changes ? ?Actinic Damage ?- Chronic condition, secondary to cumulative UV/sun exposure ?- diffuse scaly  erythematous macules with underlying dyspigmentation ?- Recommend daily broad spectrum sunscreen SPF 30+ to sun-exposed areas, reapply every 2 hours as needed.  ?- Staying in the shade or wearing long sleeves, sun glasses (UVA+UVB  protection) and wide brim hats (4-inch brim around the entire circumference of the hat) are also recommended for sun protection.  ?- Call for new or changing lesions. ? ?Varicose Veins/Spider Veins ?- Dilated blue, purple or red veins at the lower extremities ?- Reassured ?- Smaller vessels can be treated by sclerotherapy (a procedure to inject a medicine into the veins to make them disappear) if desired, but the treatment is not covered by insurance. Larger vessels may be covered if symptomatic and we would refer to vascular surgeon if treatment desired. ? ?Skin cancer screening performed today. ? ?Return in about 1 year (around 04/13/2022) for TBSE; 3 mths for AK and ISK follow up . ? ?IRudell Cobb, CMA, am acting as scribe for Sarina Ser, MD . ?Documentation: I have reviewed the above documentation for accuracy and completeness, and I agree with the above. ? ?Sarina Ser, MD ? ?

## 2021-04-13 ENCOUNTER — Encounter: Payer: Self-pay | Admitting: Dermatology

## 2021-05-04 ENCOUNTER — Emergency Department (HOSPITAL_COMMUNITY): Payer: Medicare HMO

## 2021-05-04 ENCOUNTER — Emergency Department (HOSPITAL_COMMUNITY)
Admission: EM | Admit: 2021-05-04 | Discharge: 2021-05-04 | Disposition: A | Discharge status: Home / Self Care | Payer: Medicare HMO | Attending: Emergency Medicine | Admitting: Emergency Medicine

## 2021-05-04 ENCOUNTER — Other Ambulatory Visit: Payer: Self-pay

## 2021-05-04 DIAGNOSIS — Z79899 Other long term (current) drug therapy: Secondary | ICD-10-CM | POA: Diagnosis not present

## 2021-05-04 DIAGNOSIS — E039 Hypothyroidism, unspecified: Secondary | ICD-10-CM

## 2021-05-04 DIAGNOSIS — R0602 Shortness of breath: Secondary | ICD-10-CM | POA: Diagnosis not present

## 2021-05-04 DIAGNOSIS — Z7982 Long term (current) use of aspirin: Secondary | ICD-10-CM | POA: Insufficient documentation

## 2021-05-04 DIAGNOSIS — E876 Hypokalemia: Secondary | ICD-10-CM | POA: Diagnosis not present

## 2021-05-04 DIAGNOSIS — I4891 Unspecified atrial fibrillation: Secondary | ICD-10-CM | POA: Insufficient documentation

## 2021-05-04 DIAGNOSIS — I10 Essential (primary) hypertension: Secondary | ICD-10-CM

## 2021-05-04 DIAGNOSIS — Z7901 Long term (current) use of anticoagulants: Secondary | ICD-10-CM | POA: Diagnosis not present

## 2021-05-04 DIAGNOSIS — R778 Other specified abnormalities of plasma proteins: Secondary | ICD-10-CM

## 2021-05-04 DIAGNOSIS — I480 Paroxysmal atrial fibrillation: Secondary | ICD-10-CM

## 2021-05-04 DIAGNOSIS — R110 Nausea: Secondary | ICD-10-CM | POA: Diagnosis not present

## 2021-05-04 LAB — TROPONIN I (HIGH SENSITIVITY)
Troponin I (High Sensitivity): 36 ng/L — ABNORMAL HIGH (ref <18)
Troponin I (High Sensitivity): 79 ng/L — ABNORMAL HIGH

## 2021-05-04 LAB — BASIC METABOLIC PANEL
Anion gap: 13 (ref 5–15)
BUN: 14 mg/dL (ref 8–23)
CO2: 20 mmol/L — ABNORMAL LOW (ref 22–32)
Calcium: 10.1 mg/dL (ref 8.9–10.3)
Chloride: 104 mmol/L (ref 98–111)
Creatinine, Ser: 0.91 mg/dL (ref 0.44–1.00)
GFR, Estimated: >60 mL/min (ref >60)
Glucose, Bld: 103 mg/dL — ABNORMAL HIGH (ref 70–99)
Potassium: 3.3 mmol/L — ABNORMAL LOW (ref 3.5–5.1)
Sodium: 137 mmol/L (ref 135–145)

## 2021-05-04 LAB — BRAIN NATRIURETIC PEPTIDE: B Natriuretic Peptide: 92.3 pg/mL (ref 0.0–100.0)

## 2021-05-04 LAB — CBC WITH DIFFERENTIAL/PLATELET
Abs Immature Granulocytes: 0.02 K/uL (ref 0.00–0.07)
Basophils Absolute: 0 K/uL (ref 0.0–0.1)
Basophils Relative: 0 %
Eosinophils Absolute: 0 K/uL (ref 0.0–0.5)
Eosinophils Relative: 0 %
HCT: 44 % (ref 36.0–46.0)
Hemoglobin: 14.6 g/dL (ref 12.0–15.0)
Immature Granulocytes: 0 %
Lymphocytes Relative: 23 %
Lymphs Abs: 2.1 K/uL (ref 0.7–4.0)
MCH: 29.2 pg (ref 26.0–34.0)
MCHC: 33.2 g/dL (ref 30.0–36.0)
MCV: 88 fL (ref 80.0–100.0)
Monocytes Absolute: 0.8 K/uL (ref 0.1–1.0)
Monocytes Relative: 8 %
Neutro Abs: 6.2 K/uL (ref 1.7–7.7)
Neutrophils Relative %: 69 %
Platelets: 297 K/uL (ref 150–400)
RBC: 5 MIL/uL (ref 3.87–5.11)
RDW: 13.1 % (ref 11.5–15.5)
WBC: 9.1 K/uL (ref 4.0–10.5)
nRBC: 0 % (ref 0.0–0.2)

## 2021-05-04 LAB — PROTIME-INR
INR: 1.1 (ref 0.8–1.2)
Prothrombin Time: 14.1 s (ref 11.4–15.2)

## 2021-05-04 LAB — MAGNESIUM: Magnesium: 1.9 mg/dL (ref 1.7–2.4)

## 2021-05-04 IMAGING — DX DG CHEST 1V PORT
1 series · 1 of 1 positions shown · non-contrast
Comparison: [DATE]

CLINICAL DATA: AFib, nausea and shortness of breath

EXAM:
PORTABLE CHEST 1 VIEW

[chest]
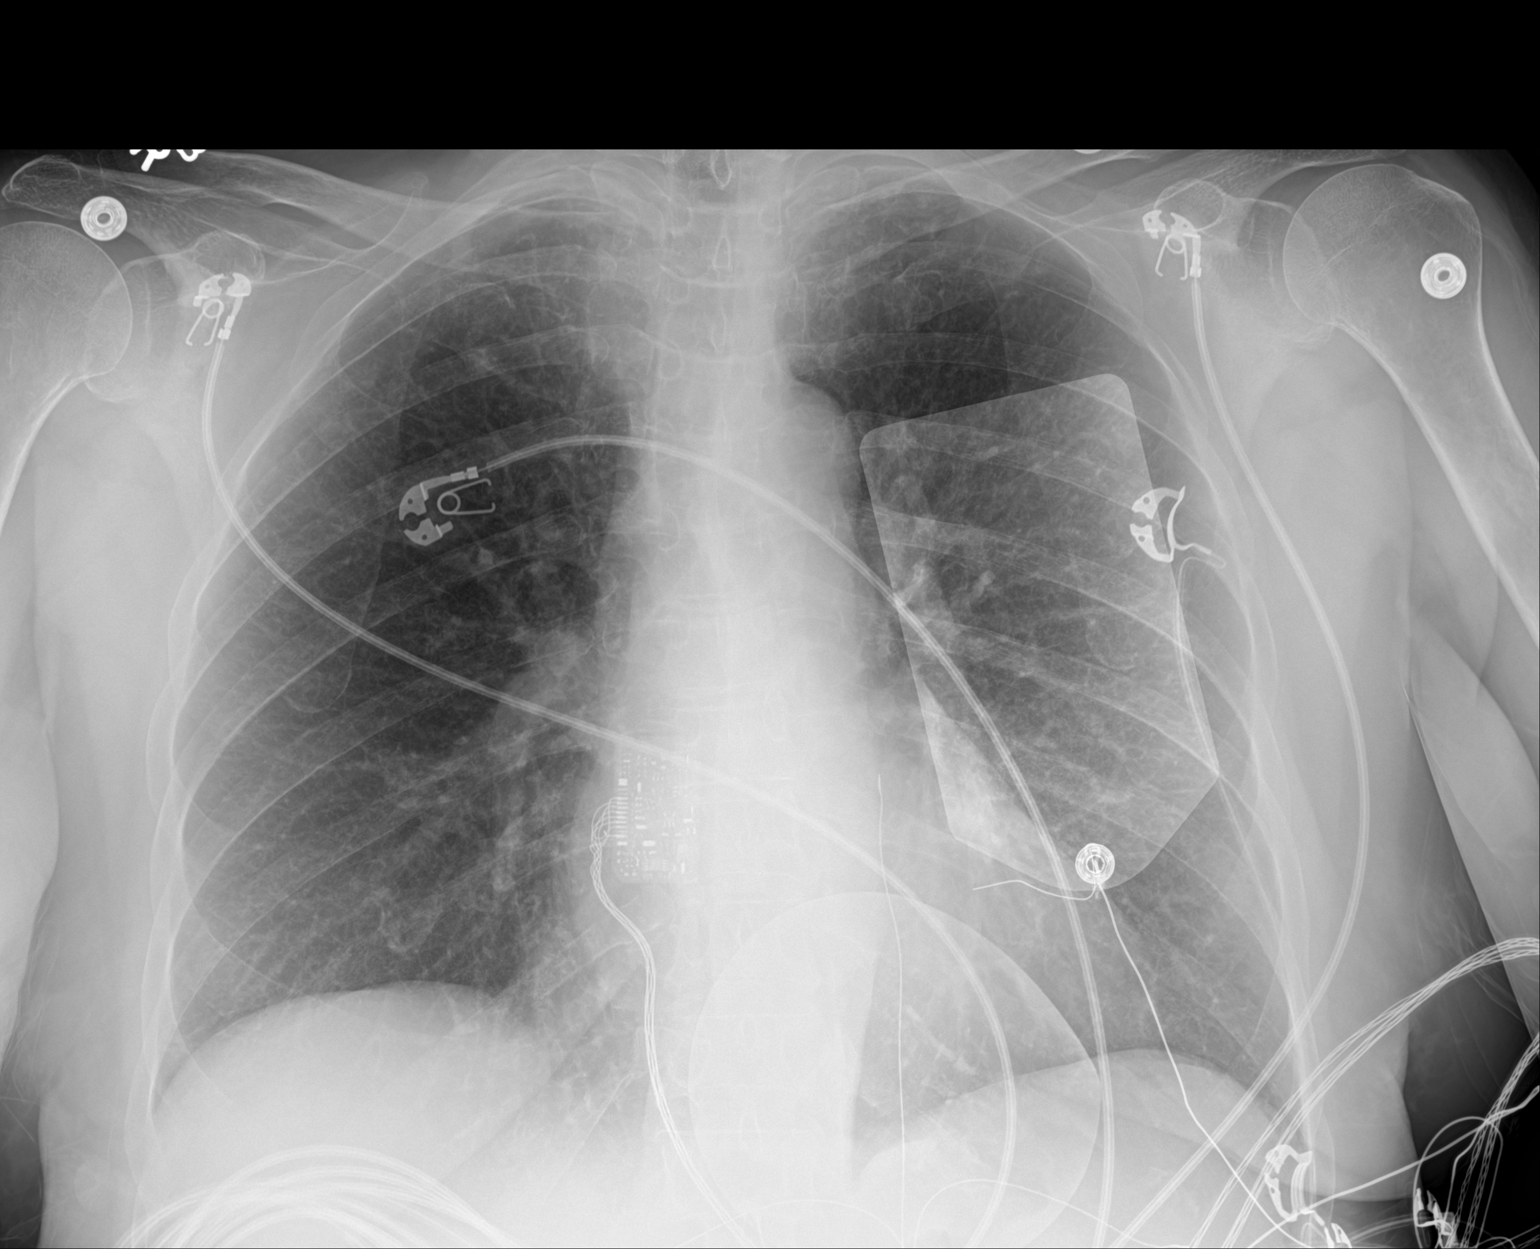

[1 of 1 positions shown; findings below may reference images not displayed]

FINDINGS: Evaluation is somewhat limited by multiple objects overlying the
patient that appear to be external. This limitation, no focal
pulmonary opacity. Cardiac and mediastinal contours are within
normal limits. No pleural effusion or pneumothorax. No acute osseous
abnormality.
IMPRESSION: No acute cardiopulmonary process.

## 2021-05-04 MED ORDER — ETOMIDATE 2 MG/ML IV SOLN
10.0000 mg | Freq: Once | INTRAVENOUS | Status: AC
  Administered 2021-05-04: 10 mg via INTRAVENOUS
  Filled 2021-05-04: qty 10

## 2021-05-04 MED ORDER — FENTANYL CITRATE (PF) 100 MCG/2ML IJ SOLN
INTRAMUSCULAR | Status: AC
  Administered 2021-05-04: 100 ug
  Filled 2021-05-04: qty 2

## 2021-05-04 MED ORDER — DILTIAZEM HCL 25 MG/5ML IV SOLN
20.0000 mg | Freq: Once | INTRAVENOUS | Status: AC
  Administered 2021-05-04: 20 mg via INTRAVENOUS
  Filled 2021-05-04: qty 5

## 2021-05-04 MED ORDER — SODIUM CHLORIDE 0.9 % IV BOLUS
500.0000 mL | Freq: Once | INTRAVENOUS | Status: AC
Start: 2021-05-04 — End: 2021-05-04
  Administered 2021-05-04: 500 mL via INTRAVENOUS

## 2021-05-04 MED ORDER — FENTANYL CITRATE PF 50 MCG/ML IJ SOSY: 50.0000 ug | PREFILLED_SYRINGE | INTRAMUSCULAR | Status: DC | PRN

## 2021-05-04 MED ORDER — METOPROLOL TARTRATE 5 MG/5ML IV SOLN
5.0000 mg | Freq: Once | INTRAVENOUS | Status: AC
  Administered 2021-05-04: 5 mg via INTRAVENOUS
  Filled 2021-05-04: qty 5

## 2021-05-04 MED ORDER — SODIUM CHLORIDE 0.9 % IV SOLN: INTRAVENOUS | Status: DC

## 2021-05-04 MED ORDER — ASPIRIN 81 MG PO CHEW
324.0000 mg | CHEWABLE_TABLET | Freq: Once | ORAL | Status: AC
  Administered 2021-05-04: 324 mg via ORAL
  Filled 2021-05-04: qty 4

## 2021-05-04 MED ORDER — POTASSIUM CHLORIDE CRYS ER 20 MEQ PO TBCR
20.0000 meq | EXTENDED_RELEASE_TABLET | Freq: Once | ORAL | Status: AC
  Administered 2021-05-04: 20 meq via ORAL
  Filled 2021-05-04: qty 1

## 2021-05-04 NOTE — ED Provider Notes (Signed)
?[place] ?Provider Note ? ? ?CSN: [id number] ?Arrival date & time: 05/04/21  1638 ? ?  ? ?History ? ?Chief Complaint  ?Patient presents with  ? Atrial Fibrillation  ? ? ?Denise Grant is a 66 y.o. female. ? ? ?Atrial Fibrillation ? ? ?66 year old female with medical history significant for anxiety, hypertension, atrial fibrillation on metoprolol and Xarelto who presents to the emergency department with a chief complaint of shortness of breath and heart palpitations.  She states that she felt herself going to atrial fibrillation around 1530 today.  She took a home 12.5 mg of metoprolol without relief or improvement.  She is on Xarelto nightly and last took her medication yesterday.  She endorses mild nausea but denies any chest pain. ? ?Home Medications ?Prior to Admission medications   ?Medication Sig Start Date End Date Taking? Authorizing Provider  ?amLODipine (NORVASC) 5 MG tablet Take 1 tablet (5 mg total) by mouth daily. 08/31/20   Tower, Wynelle Fanny, MD  ?DENTA 5000 PLUS 1.1 % CREA dental cream Take 1 application by mouth at bedtime.  08/31/19   [provider]  ?hydrOXYzine (ATARAX) 25 MG tablet Take one tab po QHS. May make drowsy. 03/23/21   Ralene Bathe, MD  ?levothyroxine (SYNTHROID) 50 MCG tablet TAKE 1 TABLET BY MOUTH DAILY BEFORE BREAKFAST 08/31/20   Tower, Wynelle Fanny, MD  ?LORazepam (ATIVAN) 1 MG tablet TAKE A HALF TABLET BY MOUTH AT BEDTIME AS NEEDED 03/29/21   Tower, Wynelle Fanny, MD  ?meclizine (ANTIVERT) 25 MG tablet Take 1 tablet (25 mg total) by mouth 3 (three) times daily as needed for dizziness. ?Patient not taking: Reported on 03/15/2021 05/21/14   Tower, Wynelle Fanny, MD  ?metoprolol succinate (TOPROL-XL) 25 MG 24 hr tablet Take 0.5 tablets (12.5 mg total) by mouth daily. 07/08/20   Fenton, Clint R, PA  ?mometasone (ELOCON) 0.1 % ointment Apply topically daily. Apply to itchy areas on the back bid prn 03/05/20   Ralene Bathe, MD  ?predniSONE (DELTASONE) 5 MG  tablet Take 1 tablet (5 mg total) by mouth 2 (two) times daily with a meal. ?Patient not taking: Reported on 04/12/2021 09/17/20   Tower, Wynelle Fanny, MD  ?Corrie Dandy [facility]) 5 % SOLN Apply to toenails at bedtme 03/05/20   Ralene Bathe, MD  ?Alveda Reasons 20 MG TABS tablet TAKE 1 TABLET BY MOUTH DAILY WITH SUPPER. 02/17/21   Fenton, Clint R, PA  ?   ? ?Allergies    ?Buspar [buspirone], Calcium, Fluticasone propionate, Hctz [hydrochlorothiazide], Paroxetine, Zoloft [sertraline], and Hydroxychloroquine   ? ?Review of Systems   ?Review of Systems  ?Unable to perform ROS: Acuity of condition  ? ?Physical Exam ?Updated Vital Signs ?BP 106/63   Pulse (!) 59   Temp 98.3 ?F (36.8 ?C) (Temporal)   Resp 15   SpO2 100%  ?Physical Exam ?Vitals and nursing note reviewed.  ?Constitutional:   ?   General: She is not in acute distress. ?   Comments: Uncomfortable appearing  ?HENT:  ?   Head: Normocephalic and atraumatic.  ?Eyes:  ?   Conjunctiva/sclera: Conjunctivae normal.  ?   Pupils: Pupils are equal, round, and reactive to light.  ?Cardiovascular:  ?   Rate and Rhythm: Tachycardia present. Rhythm irregular.  ?   Pulses: Normal pulses.  ?Pulmonary:  ?   Effort: Pulmonary effort is normal. No respiratory distress.  ?Abdominal:  ?   General: There is no distension.  ?   Tenderness:  There is no guarding.  ?Musculoskeletal:     ?   General: No deformity or signs of injury.  ?   Cervical back: Neck supple.  ?   Right lower leg: No edema.  ?   Left lower leg: No edema.  ?Skin: ?   Findings: No lesion or rash.  ?Neurological:  ?   General: No focal deficit present.  ?   Mental Status: She is alert. Mental status is at baseline.  ? ? ?ED Results / Procedures / Treatments   ?Labs ?(all labs ordered are listed, but only abnormal results are displayed) ?Labs Reviewed  ?BASIC METABOLIC PANEL - Abnormal; Notable for the following components:  ?    Result Value  ? Potassium 3.3 (*)   ? CO2 20 (*)   ? Glucose, Bld 103 (*)   ? All other components  within normal limits  ?TROPONIN I (HIGH SENSITIVITY) - Abnormal; Notable for the following components:  ? Troponin I (High Sensitivity) 36 (*)   ? All other components within normal limits  ?TROPONIN I (HIGH SENSITIVITY) - Abnormal; Notable for the following components:  ? Troponin I (High Sensitivity) 79 (*)   ? All other components within normal limits  ?MAGNESIUM  ?BRAIN NATRIURETIC PEPTIDE  ?PROTIME-INR  ?CBC WITH DIFFERENTIAL/PLATELET  ?TSH  ?CBG MONITORING, ED  ? ? ?EKG ?EKG Interpretation ? ?Date/Time:  Tuesday May 04 2021 21:33:58 EDT ?Ventricular Rate:  71 ?PR Interval:  146 ?QRS Duration: 95 ?QT Interval:  388 ?QTC Calculation: 422 ?R Axis:   -9 ?Text Interpretation: Sinus rhythm Low voltage, precordial leads Confirmed by Regan Lemming ([id number]) on 05/04/2021 10:23:27 PM ? ?Radiology ?DG Chest Portable 1 View ? ?Result Date: 05/04/2021 ?CLINICAL DATA:  AFib, nausea and shortness of breath EXAM: PORTABLE CHEST 1 VIEW COMPARISON:  10/12/2019 FINDINGS: Evaluation is somewhat limited by multiple objects overlying the patient that appear to be external. This limitation, no focal pulmonary opacity. Cardiac and mediastinal contours are within normal limits. No pleural effusion or pneumothorax. No acute osseous abnormality. IMPRESSION: No acute cardiopulmonary process. Electronically Signed   By: Merilyn Baba M.D.   On: 05/04/2021 19:24   ? ?Procedures ?.Cardioversion ? ?Date/Time: 05/04/2021 8:29 PM ?Performed by: Regan Lemming, MD ?Authorized by: Regan Lemming, MD  ? ?Consent:  ?  Consent obtained:  Written and verbal ?  Alternatives discussed:  No treatment, rate-control medication and delayed treatment ?Pre-procedure details:  ?  Cardioversion basis:  Emergent ?  Rhythm:  Atrial fibrillation ?Attempt one:  ?  Cardioversion mode:  Synchronous ?  Waveform:  Biphasic ?  Shock (Joules):  120 ?  Shock outcome:  Conversion to normal sinus rhythm ?Post-procedure details:  ?  Patient status:  Awake ?  Patient tolerance  of procedure:  Tolerated well, no immediate complications ?.Sedation ? ?Date/Time: 05/04/2021 9:41 PM ?Performed by: Regan Lemming, MD ?Authorized by: Regan Lemming, MD  ? ?Consent:  ?  Consent obtained:  Verbal and written ?  Consent given by:  Patient ?  Risks discussed:  Allergic reaction, dysrhythmia, inadequate sedation, respiratory compromise necessitating ventilatory assistance and intubation, vomiting and nausea ?Universal protocol:  ?  Immediately prior to procedure, a time out was called: no   ?  Patient identity confirmed:  Arm band and verbally with patient ?Indications:  ?  Procedure performed:  Cardioversion ?Pre-sedation assessment:  ?  Time since last food or drink:  6 hrs ?  ASA classification: class 3 - patient with severe systemic disease   ?  Mouth opening:  2 finger widths ?  Thyromental distance:  3 finger widths ?  Mallampati score:  I - soft palate, uvula, fauces, pillars visible ?  Neck mobility: normal   ?  Pre-sedation assessments completed and reviewed: airway patency, cardiovascular function, hydration status, mental status, nausea/vomiting, pain level, respiratory function and temperature   ?  Pre-sedation assessment completed:  05/04/2021 9:00 PM ?Immediate pre-procedure details:  ?  Reassessment: Patient reassessed immediately prior to procedure   ?  Reviewed: vital signs, relevant labs/tests and NPO status   ?  Verified: bag valve mask available, emergency equipment available, intubation equipment available, IV patency confirmed, oxygen available, reversal medications available and suction available   ?Procedure details (see MAR for exact dosages):  ?  Preoxygenation:  Nasal cannula ?  Sedation:  Etomidate ?  Intended level of sedation: deep ?  Analgesia:  Fentanyl ?  Intra-procedure monitoring:  Blood pressure monitoring, cardiac monitor, continuous capnometry, continuous pulse oximetry, frequent LOC assessments and frequent vital sign checks ?  Intra-procedure events: hypoxia and  respiratory depression   ?  Intra-procedure management:  BVM ventilation ?  Total Provider sedation time (minutes):  10 ?Post-procedure details:  ?  Post-sedation assessment completed:  05/04/2021 9:54 PM

## 2021-05-04 NOTE — Consult Note (Signed)
?Cardiology Consultation:  ? ?Patient ID: Denise Grant ?MRN: [id number]; DOB: 12/24/1955 ? ?Admit date: 05/04/2021 ?Date of Consult: 05/04/2021 ? ?PCP:  Abner Greenspan, MD ?  ?[facility] Providers ?Cardiologist:  None      ? ? ?Patient Profile:  ? ?Denise Grant is a 66 y.o. female with a hx of atrial fibrillation, rheumatoid arthritis, mild aortic regurgitation who is being seen 05/04/2021 for the evaluation of atrial fibrillation at the request of Dr. Armandina Gemma. ? ?History of Present Illness:  ? ?Denise Grant is a 66 year old female who presents to the emergency department with palpitations.  She has known paroxysmal atrial fibrillation and confirms she is completely adherent to rivaroxaban.  She developed shortness of breath and nausea after going into atrial fibrillation and took an adjunctive metoprolol without relief so she presented to the ED.  No prodromal heart failure symptoms or chest pain. ? ?No illicits.  No recent stressors.  She has not been screened for OSA. ? ?She last cardioverted about 6 months ago.  She has bouts of AF here and there but they are typically short lived. ? ?Takes synthroid but TSH historically in range.  A bit low @ 0.53 in August. ? ?Past Medical History:  ?Diagnosis Date  ? Actinic keratosis   ? Allergic rhinitis   ? Anxiety   ? Chiari malformation   ? DDD (degenerative disc disease)   ? Family history of other specified malignant neoplasm   ? Headache(784.0)   ? Insomnia, unspecified   ? Lactose intolerance   ? Mitral valve disorders(424.0)   ? Osteopenia   ? Seborrheic dermatitis, unspecified   ? Vaginal cyst   ? stable  ? ? ?Past Surgical History:  ?Procedure Laterality Date  ? AIR/FLUID EXCHANGE Right 07/25/2018  ? Procedure: Air/Fluid Exchange;  Surgeon: Jalene Mullet, MD;  Location: [facility];  Service: Ophthalmology;  Laterality: Right;  ? bone spur removal  10/06  ? shoulder  ? CATARACT EXTRACTION, BILATERAL Bilateral 2016  ? [place] SURGERY  2005  ? Chiari  Malformation surgery  6/03  ? EXCISION MORTON'S NEUROMA  2/04  ? GAS INSERTION Right 07/25/2018  ? Procedure: Insertion Of Gas SF6;  Surgeon: Jalene Mullet, MD;  Location: [facility];  Service: Ophthalmology;  Laterality: Right;  ? LUMBAR DISC SURGERY    ? 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 Procedure: REPAIR OF COMPLEX TRACTION RETINAL DETACHMENT - 25GA VITRECTOMY, ENDOLASER, DRAINAGE OF SUBRETINAL FLUID;  Surgeon: Jalene Mullet, MD;  Location: [facility];  Service: Ophthalmology;  Laterality: Right;  ? TUBAL LIGATION    ?  ? ?Home Medications:  ?Prior to Admission medications   ?Medication Sig Start Date End Date Taking? Authorizing Provider  ?amLODipine (NORVASC) 5 MG tablet Take 1 tablet (5 mg total) by mouth daily. 08/31/20   Tower, Wynelle Fanny, MD  ?DENTA 5000 PLUS 1.1 % CREA dental cream Take 1 application by mouth at bedtime.  08/31/19   [provider]  ?hydrOXYzine (ATARAX) 25 MG tablet Take one tab po QHS. May make drowsy. 03/23/21   Ralene Bathe, MD  ?levothyroxine (SYNTHROID) 50 MCG tablet TAKE 1 TABLET BY MOUTH DAILY BEFORE BREAKFAST 08/31/20   Tower, Wynelle Fanny, MD  ?LORazepam (ATIVAN) 1 MG tablet TAKE A HALF TABLET BY MOUTH AT BEDTIME AS NEEDED 03/29/21   Tower, Wynelle Fanny, MD  ?meclizine (ANTIVERT) 25 MG tablet Take 1 tablet (25 mg total) by mouth 3 (three) times daily as  needed for dizziness. ?Patient not taking: Reported on 03/15/2021 05/21/14   Tower, Wynelle Fanny, MD  ?metoprolol succinate (TOPROL-XL) 25 MG 24 hr tablet Take 0.5 tablets (12.5 mg total) by mouth daily. 07/08/20   Fenton, Clint R, PA  ?mometasone (ELOCON) 0.1 % ointment Apply topically daily. Apply to itchy areas on the back bid prn 03/05/20   Ralene Bathe, MD  ?predniSONE (DELTASONE) 5 MG tablet Take 1 tablet (5 mg total) by mouth 2 (two) times daily with a meal. ?Patient not taking: Reported on 04/12/2021 09/17/20   Tower, Wynelle Fanny, MD  ?Corrie Dandy [facility]) 5 % SOLN Apply to toenails at bedtme 03/05/20   Ralene Bathe, MD  ?Alveda Reasons 20 MG TABS tablet TAKE 1 TABLET BY MOUTH DAILY WITH SUPPER. 02/17/21   Fenton, Clint R, PA  ? ? ?Inpatient Medications: ?Scheduled Meds: ? potassium chloride  20 mEq Oral Once  ? ?Continuous Infusions: ? sodium chloride 20 mL/hr at 05/04/21 2130  ? ?PRN Meds: ?fentaNYL (SUBLIMAZE) injection ? ?Allergies:    ?Allergies  ?Allergen Reactions  ? Buspar [Buspirone]   ?  Felt funny  ? Calcium   ?  REACTION: cannot tolerate any calcium supplement - GI sympotms  ? Fluticasone Propionate   ?  REACTION: headache/irritation  ? Hctz [Hydrochlorothiazide] Other (See Comments)  ?  Eye redness and discomfort/ slt blurred vision   ? Paroxetine   ?  REACTION: abdominal pain and constipation  ? Zoloft [Sertraline]   ?  Diarrhea   ? Hydroxychloroquine Itching and Rash  ? ? ?Social History:   ?Social History  ? ?Socioeconomic History  ? Marital status: Widowed  ?  Spouse name: Not on file  ? Number of children: Not on file  ? Years of education: Not on file  ? Highest education level: Not on file  ?Occupational History  ? Not on file  ?Tobacco Use  ? Smoking status: Former  ?  Types: Cigarettes  ?  Quit date: 01/17/2001  ?  Years since quitting: 20.3  ?  Passive exposure: Past  ? Smokeless tobacco: Never  ? Tobacco comments:  ?  Former smoker 01/13/2021  ?Substance and Sexual Activity  ? Alcohol use: No  ?  Alcohol/week: 0.0 standard drinks  ? Drug use: No  ? Sexual activity: Not on file  ?Other Topics Concern  ? Not on file  ?Social History Narrative  ? Walks daily for exercise  ?   ? Married--husband diagnosed with stomach cancer (5/11)  ? Lost her husband 4/12 to cancer   ? ?Social Determinants of Health  ? ?Financial Resource Strain: Not on file  ?Food Insecurity: Not on file  ?Transportation Needs: Not on file  ?Physical Activity: Not on file  ?Stress: Not on file  ?Social Connections: Not on file  ?Intimate Partner Violence: Not on file  ?  ?Family History:   ?Family History  ?Problem Relation Age of Onset  ? Melanoma  Sister   ? Lung cancer Father   ? Coronary artery disease Mother   ? Heart failure Mother   ? Hypertension Mother   ? Anxiety disorder Son   ? Lung cancer Brother   ?     + smoker  ? Breast cancer Neg Hx   ?  ? ?ROS:  ?Please see the history of present illness.  ? ?All other ROS reviewed and negative.    ? ?Physical Exam/Data:  ? ?Vitals:  ? 05/04/21 2015 05/04/21 2030 05/04/21 2045 05/04/21 2100  ?  BP: 123/89 124/86 119/70 119/65  ?Pulse: (!) 107 99 (!) 114 (!) 110  ?Resp: 17 (!) '24 19 20  '$ ?Temp:      ?TempSrc:      ?SpO2: 100% 100% 100% 100%  ? ?No intake or output data in the 24 hours ending 05/04/21 2133 ? ?  03/15/2021  ?  9:59 AM 01/13/2021  ?  9:32 AM 10/19/2020  ?  8:40 AM  ?Last 3 Weights  ?Weight (lbs) 158 lb 155 lb 9.6 oz 152 lb 12.8 oz  ?Weight (kg) 71.668 kg 70.58 kg 69.31 kg  ?   ?There is no height or weight on file to calculate BMI.  ?General:  Well nourished, well developed, in no acute distress lying flat ?HEENT: normal ?Neck: no JVD ?Vascular: No carotid bruits; Distal pulses 2+ bilaterally ?Cardiac:  normal S1, S2; RRR; no murmur ?Lungs:  clear to auscultation bilaterally, no wheezing, rhonchi or rales  ?Abd: soft, nontender, no hepatomegaly  ?Ext: no edema ?Musculoskeletal:  No deformities, BUE and BLE strength normal and equal ?Skin: warm and dry  ?Neuro:  CNs 2-12 intact, no focal abnormalities noted ?Psych:  Normal affect  ? ?EKG:  The EKG was personally reviewed and demonstrates:  AF RVR normal axes and intervals otherwise unremarkable. ?Telemetry:  Telemetry was personally reviewed and demonstrates:  AF RVR. ? ?Relevant CV Studies: ?Reviewed. ? ?Laboratory Data: ? ?High Sensitivity Troponin:   ?Recent Labs  ?Lab 05/04/21 ?9604 05/04/21 ?2015  ?TROPONINIHS 36* 79*  ?   ?Chemistry ?Recent Labs  ?Lab 05/04/21 ?1833  ?NA 137  ?K 3.3*  ?CL 104  ?CO2 20*  ?GLUCOSE 103*  ?BUN 14  ?CREATININE 0.91  ?CALCIUM 10.1  ?MG 1.9  ?GFRNONAA >60  ?ANIONGAP 13  ?  ?No results for input(s): PROT, ALBUMIN, AST,  ALT, ALKPHOS, BILITOT in the last 168 hours. ?Lipids No results for input(s): CHOL, TRIG, HDL, LABVLDL, LDLCALC, CHOLHDL in the last 168 hours.  ?Hematology ?Recent Labs  ?Lab 05/04/21 ?1833  ?WBC 9.1  ?RBC 5.00

## 2021-05-04 NOTE — Discharge Instructions (Signed)
You were evaluated in the Emergency Department and after careful evaluation, we did not find any emergent condition requiring admission or further testing in the hospital. ? ?Your exam/testing today was overall concerning for atrial fibrillation with RVR that ultimately needed to be controlled by synchronized cardioversion which was successful in the emergency department today.  He did have an elevated troponin.  This is likely due to demand ischemia from your elevated heart rate over a period of time.  We have low concern for a heart attack at this time.  Did discuss your case with on-call cardiology.  You are safe to follow-up in atrial fibrillation clinic, take your anticoagulation tonight and resume your beta-blocker tomorrow. ? ?Please return to the Emergency Department if you experience any worsening of your condition.  Thank you for allowing [place] to be a part of your care. ? ?

## 2021-05-04 NOTE — ED Triage Notes (Signed)
Pt with hx of afib felt herself go into afib at approximately 1530 this afternoon. On Xarelto. Took a metoprolol 12.5 mg this afternoon without relief. Endorses nausea and sob.  ?

## 2021-05-05 LAB — TSH: TSH: 2.675 u[IU]/mL (ref 0.350–4.500)

## 2021-05-11 ENCOUNTER — Encounter (HOSPITAL_COMMUNITY): Payer: Self-pay | Admitting: Physician Assistant

## 2021-05-11 ENCOUNTER — Ambulatory Visit (HOSPITAL_COMMUNITY)
Admission: RE | Admit: 2021-05-11 | Discharge: 2021-05-11 | Disposition: A | Discharge status: Home / Self Care | Payer: Medicare HMO | Source: Ambulatory Visit | Attending: Physician Assistant | Admitting: Physician Assistant

## 2021-05-11 VITALS — BP 136/80 | HR 69 | Ht 67.0 in | Wt 155.2 lb

## 2021-05-11 DIAGNOSIS — Z7901 Long term (current) use of anticoagulants: Secondary | ICD-10-CM | POA: Diagnosis not present

## 2021-05-11 DIAGNOSIS — I341 Nonrheumatic mitral (valve) prolapse: Secondary | ICD-10-CM | POA: Diagnosis not present

## 2021-05-11 DIAGNOSIS — I480 Paroxysmal atrial fibrillation: Secondary | ICD-10-CM | POA: Diagnosis not present

## 2021-05-11 DIAGNOSIS — M353 Polymyalgia rheumatica: Secondary | ICD-10-CM | POA: Insufficient documentation

## 2021-05-11 DIAGNOSIS — E039 Hypothyroidism, unspecified: Secondary | ICD-10-CM | POA: Insufficient documentation

## 2021-05-11 DIAGNOSIS — D6869 Other thrombophilia: Secondary | ICD-10-CM | POA: Insufficient documentation

## 2021-05-11 DIAGNOSIS — I10 Essential (primary) hypertension: Secondary | ICD-10-CM | POA: Diagnosis not present

## 2021-05-11 DIAGNOSIS — Z79899 Other long term (current) drug therapy: Secondary | ICD-10-CM | POA: Insufficient documentation

## 2021-05-11 MED ORDER — FLECAINIDE ACETATE 50 MG PO TABS: 50.0000 mg | ORAL_TABLET | Freq: Two times a day (BID) | ORAL | 3 refills | Status: DC

## 2021-05-11 NOTE — Progress Notes (Signed)
? ? ?Primary Care Physician: Tower, Wynelle Fanny, MD ?Primary Cardiologist: none ?Primary Electrophysiologist: none ?Referring Physician: [facility] ? ? ?Denise Grant is a 66 y.o. female with a history of Chiari malformation, polymyalgia rheumatica, HTN, hypothyroidism, MVP, generalized anxiety, and paroxysmal atrial fibrillation who presents for follow up in the [facility]. The patient was initially diagnosed with atrial fibrillation on 09/19/18 after presenting to the ER with symptoms of SOB, chest discomfort, and weakness. She was found to be in afib with RVR and underwent successful DCCV. There were no specific triggers that the patient could identify. She denies snoring or significant alcohol use. She had an echo 09/26/18 which showed preserved EF and a Zio patch 10/01/18 which showed no afib. Patient presented to the ED on 10/12/19 with symptoms of heart racing, nausea, and SOB. ECG showed afib with RVR. She underwent DCCV at that time. ? ?On follow up today, patient was seen at the ED 05/04/21 for rapid afib and underwent urgent DCCV. She remains in SR today feeling well. There were no specific triggers for her afib that she could identify.  ? ?Today, she denies symptoms of palpitations, chest pain, shortness of breath, orthopnea, PND, lower extremity edema, dizziness, presyncope, syncope, snoring, daytime somnolence, bleeding, or neurologic sequela. The patient is tolerating medications without difficulties and is otherwise without complaint today.  ? ? ?Atrial Fibrillation Risk Factors: ? ?she does not have symptoms or diagnosis of sleep apnea. ?she does not have a history of rheumatic fever. ?she does not have a history of alcohol use. ?The patient does have a history of early familial atrial fibrillation or other arrhythmias. Sister has afib, brother has PPM. ? ?she has a BMI of Body mass index is 24.31 kg/m?Marland KitchenMarland Kitchen ?Filed Weights  ? 05/11/21 0911  ?Weight: 70.4 kg  ? ? ? ?Family History   ?Problem Relation Age of Onset  ? Melanoma Sister   ? Lung cancer Father   ? Coronary artery disease Mother   ? Heart failure Mother   ? Hypertension Mother   ? Anxiety disorder Son   ? Lung cancer Brother   ?     + smoker  ? Breast cancer Neg Hx   ? ? ? ?Atrial Fibrillation Management history: ? ?Previous antiarrhythmic drugs: none ?Previous cardioversions: 09/19/18, 10/12/19, 05/04/21 ?Previous ablations: none ?CHADS2VASC score: 3 ?Anticoagulation history: Xarelto ? ? ?Past Medical History:  ?Diagnosis Date  ? Actinic keratosis   ? Allergic rhinitis   ? Anxiety   ? Chiari malformation   ? DDD (degenerative disc disease)   ? Family history of other specified malignant neoplasm   ? Headache(784.0)   ? Insomnia, unspecified   ? Lactose intolerance   ? Mitral valve disorders(424.0)   ? Osteopenia   ? Seborrheic dermatitis, unspecified   ? Vaginal cyst   ? stable  ? ?Past Surgical History:  ?Procedure Laterality Date  ? AIR/FLUID EXCHANGE Right 07/25/2018  ? Procedure: Air/Fluid Exchange;  Surgeon: Jalene Mullet, MD;  Location: [facility];  Service: Ophthalmology;  Laterality: Right;  ? bone spur removal  10/06  ? shoulder  ? CATARACT EXTRACTION, BILATERAL Bilateral 2016  ? [place] SURGERY  2005  ? Chiari Malformation surgery  6/03  ? EXCISION MORTON'S NEUROMA  2/04  ? GAS INSERTION Right 07/25/2018  ? Procedure: Insertion Of Gas SF6;  Surgeon: Jalene Mullet, MD;  Location: [facility];  Service: Ophthalmology;  Laterality: Right;  ? LUMBAR DISC SURGERY    ?  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 Procedure: REPAIR OF COMPLEX TRACTION RETINAL DETACHMENT - 25GA VITRECTOMY, ENDOLASER, DRAINAGE OF SUBRETINAL FLUID;  Surgeon: Jalene Mullet, MD;  Location: [facility];  Service: Ophthalmology;  Laterality: Right;  ? TUBAL LIGATION    ? ? ?Current Outpatient Medications  ?Medication Sig Dispense Refill  ? amLODipine (NORVASC) 5 MG tablet Take 1 tablet (5 mg total) by mouth daily. 90 tablet 3  ? DENTA 5000 PLUS  1.1 % CREA dental cream Take 1 application by mouth at bedtime.     ? levothyroxine (SYNTHROID) 50 MCG tablet TAKE 1 TABLET BY MOUTH DAILY BEFORE BREAKFAST 90 tablet 3  ? LORazepam (ATIVAN) 1 MG tablet TAKE A HALF TABLET BY MOUTH AT BEDTIME AS NEEDED 15 tablet 3  ? metoprolol succinate (TOPROL-XL) 25 MG 24 hr tablet Take 0.5 tablets (12.5 mg total) by mouth daily. 45 tablet 3  ? mometasone (ELOCON) 0.1 % ointment Apply topically daily. Apply to itchy areas on the back bid prn 45 g 1  ? predniSONE (DELTASONE) 5 MG tablet Take 7.5 mg by mouth.    ? Tavaborole (KERYDIN) 5 % SOLN Apply to toenails at bedtme 4 mL 2  ? XARELTO 20 MG TABS tablet TAKE 1 TABLET BY MOUTH DAILY WITH SUPPER. 90 tablet 2  ? ?No current facility-administered medications for this encounter.  ? ? ?Allergies  ?Allergen Reactions  ? Fentanyl Other (See Comments)  ?  Seizure   ? Buspar [Buspirone]   ?  Felt funny  ? Calcium   ?  REACTION: cannot tolerate any calcium supplement - GI sympotms  ? Fluticasone Propionate   ?  REACTION: headache/irritation  ? Hctz [Hydrochlorothiazide] Other (See Comments)  ?  Eye redness and discomfort/ slt blurred vision   ? Paroxetine   ?  REACTION: abdominal pain and constipation  ? Zoloft [Sertraline]   ?  Diarrhea   ? Hydroxychloroquine Itching and Rash  ? ? ?Social History  ? ?Socioeconomic History  ? Marital status: Widowed  ?  Spouse name: Not on file  ? Number of children: Not on file  ? Years of education: Not on file  ? Highest education level: Not on file  ?Occupational History  ? Not on file  ?Tobacco Use  ? Smoking status: Former  ?  Types: Cigarettes  ?  Quit date: 01/17/2001  ?  Years since quitting: 20.3  ?  Passive exposure: Past  ? Smokeless tobacco: Never  ? Tobacco comments:  ?  Former smoker 01/13/2021  ?Substance and Sexual Activity  ? Alcohol use: No  ?  Alcohol/week: 0.0 standard drinks  ? Drug use: No  ? Sexual activity: Not on file  ?Other Topics Concern  ? Not on file  ?Social History Narrative  ?  Walks daily for exercise  ?   ? Married--husband diagnosed with stomach cancer (5/11)  ? Lost her husband 4/12 to cancer   ? ?Social Determinants of Health  ? ?Financial Resource Strain: Not on file  ?Food Insecurity: Not on file  ?Transportation Needs: Not on file  ?Physical Activity: Not on file  ?Stress: Not on file  ?Social Connections: Not on file  ?Intimate Partner Violence: Not on file  ? ? ? ?ROS- All systems are reviewed and negative except as per the HPI above. ? ?Physical Exam: ?Vitals:  ? 05/11/21 0911  ?BP: 136/80  ?Pulse: 69  ?Weight: 70.4 kg  ?Height: '5\' 7"'$  (1.702 m)  ? ? ? ?  GEN- The patient is a well appearing female, alert and oriented x 3 today.   ?HEENT-head normocephalic, atraumatic, sclera clear, conjunctiva pink, hearing intact, trachea midline. ?Lungs- Clear to ausculation bilaterally, normal work of breathing ?Heart- Regular rate and rhythm, no murmurs, rubs or gallops  ?GI- soft, NT, ND, + BS ?Extremities- no clubbing, cyanosis, or edema ?MS- no significant deformity or atrophy ?Skin- no rash or lesion ?Psych- euthymic mood, full affect ?Neuro- strength and sensation are intact ? ? ?Wt Readings from Last 3 Encounters:  ?05/11/21 70.4 kg  ?03/15/21 71.7 kg  ?01/13/21 70.6 kg  ? ? ?EKG today demonstrates  ?SR ?Vent. rate 69 BPM ?PR interval 142 ms ?QRS duration 84 ms ?QT/QTcB 378/405 ms ? ?Echo 09/26/18 ?1. The left ventricle has normal systolic function with an ejection fraction of 60-65%. The cavity size was normal. Left ventricular diastolic parameters were normal. No evidence of left ventricular regional wall motion abnormalities. ? 2. The right ventricle has normal systolic function. The cavity was normal. There is no increase in right ventricular wall thickness. Right ventricular systolic pressure is normal with an estimated pressure of 21.8 mmHg. ? 3. The pericardial effusion is circumferential. ? 4. Trivial pericardial effusion is present. ? 5. Mild thickening of the mitral valve  leaflet. There is mild mitral annular calcification present. ? 6. Mild thickening of the aortic valve. Aortic valve regurgitation is mild by color flow Doppler. ? 7. The aorta is normal unless otherwise note

## 2021-05-11 NOTE — Patient Instructions (Signed)
Start Flecainide 50mg twice a day 

## 2021-05-14 ENCOUNTER — Ambulatory Visit (HOSPITAL_COMMUNITY)
Admission: RE | Admit: 2021-05-14 | Discharge: 2021-05-14 | Disposition: A | Discharge status: Home / Self Care | Payer: Medicare HMO | Source: Ambulatory Visit | Attending: Physician Assistant | Admitting: Physician Assistant

## 2021-05-14 VITALS — HR 68

## 2021-05-14 DIAGNOSIS — I480 Paroxysmal atrial fibrillation: Secondary | ICD-10-CM | POA: Insufficient documentation

## 2021-05-14 MED ORDER — FLECAINIDE ACETATE 50 MG PO TABS: ORAL_TABLET | ORAL | 3 refills | Status: DC

## 2021-05-14 NOTE — Progress Notes (Signed)
Patient returns for ECG after starting flecainide. ECG shows SR HR 68, PR 162, QRS 88, QTc 416. She has not had any episodes of afib since starting the medication. She does have a headache after taking the evening dose. Will decrease to 50 mg AM and 25 mg PM. If she tolerates this dose, will proceed with TST. She expresses an interest in consultation with EP for ablation. We discussed this at her last visit, will refer. F/u as scheduled.  ?

## 2021-05-17 DIAGNOSIS — Z111 Encounter for screening for respiratory tuberculosis: Secondary | ICD-10-CM | POA: Diagnosis not present

## 2021-05-17 DIAGNOSIS — Z7960 Long term (current) use of unspecified immunomodulators and immunosuppressants: Secondary | ICD-10-CM | POA: Diagnosis not present

## 2021-05-17 DIAGNOSIS — M353 Polymyalgia rheumatica: Secondary | ICD-10-CM | POA: Diagnosis not present

## 2021-05-20 ENCOUNTER — Telehealth (HOSPITAL_COMMUNITY): Payer: Self-pay | Admitting: *Deleted

## 2021-05-20 DIAGNOSIS — H43813 Vitreous degeneration, bilateral: Secondary | ICD-10-CM | POA: Diagnosis not present

## 2021-05-20 DIAGNOSIS — Z9889 Other specified postprocedural states: Secondary | ICD-10-CM | POA: Diagnosis not present

## 2021-05-20 DIAGNOSIS — Z961 Presence of intraocular lens: Secondary | ICD-10-CM | POA: Diagnosis not present

## 2021-05-20 DIAGNOSIS — H04123 Dry eye syndrome of bilateral lacrimal glands: Secondary | ICD-10-CM | POA: Diagnosis not present

## 2021-05-20 DIAGNOSIS — H10413 Chronic giant papillary conjunctivitis, bilateral: Secondary | ICD-10-CM | POA: Diagnosis not present

## 2021-05-20 DIAGNOSIS — H31093 Other chorioretinal scars, bilateral: Secondary | ICD-10-CM | POA: Diagnosis not present

## 2021-05-20 NOTE — Telephone Encounter (Signed)
Patient approved for patient assistance of Xarelto through 01/16/22. ?

## 2021-05-24 ENCOUNTER — Other Ambulatory Visit (HOSPITAL_COMMUNITY): Payer: Self-pay | Admitting: Physician Assistant

## 2021-06-15 DIAGNOSIS — I4892 Unspecified atrial flutter: Secondary | ICD-10-CM | POA: Diagnosis not present

## 2021-06-15 DIAGNOSIS — Z7952 Long term (current) use of systemic steroids: Secondary | ICD-10-CM | POA: Diagnosis not present

## 2021-06-15 DIAGNOSIS — I4891 Unspecified atrial fibrillation: Secondary | ICD-10-CM | POA: Diagnosis not present

## 2021-06-15 DIAGNOSIS — M1990 Unspecified osteoarthritis, unspecified site: Secondary | ICD-10-CM | POA: Diagnosis not present

## 2021-06-15 DIAGNOSIS — G4700 Insomnia, unspecified: Secondary | ICD-10-CM | POA: Diagnosis not present

## 2021-06-15 DIAGNOSIS — Z809 Family history of malignant neoplasm, unspecified: Secondary | ICD-10-CM | POA: Diagnosis not present

## 2021-06-15 DIAGNOSIS — I10 Essential (primary) hypertension: Secondary | ICD-10-CM | POA: Diagnosis not present

## 2021-06-15 DIAGNOSIS — M069 Rheumatoid arthritis, unspecified: Secondary | ICD-10-CM | POA: Diagnosis not present

## 2021-06-15 DIAGNOSIS — Z7901 Long term (current) use of anticoagulants: Secondary | ICD-10-CM | POA: Diagnosis not present

## 2021-06-15 DIAGNOSIS — D6869 Other thrombophilia: Secondary | ICD-10-CM | POA: Diagnosis not present

## 2021-06-15 DIAGNOSIS — R69 Illness, unspecified: Secondary | ICD-10-CM | POA: Diagnosis not present

## 2021-06-15 DIAGNOSIS — E039 Hypothyroidism, unspecified: Secondary | ICD-10-CM | POA: Diagnosis not present

## 2021-06-28 ENCOUNTER — Encounter: Payer: Self-pay | Admitting: Cardiology

## 2021-06-28 ENCOUNTER — Ambulatory Visit: Payer: Medicare HMO | Admitting: Cardiology

## 2021-06-28 VITALS — BP 132/70 | HR 69 | Ht 66.0 in | Wt 155.6 lb

## 2021-06-28 DIAGNOSIS — D6869 Other thrombophilia: Secondary | ICD-10-CM | POA: Diagnosis not present

## 2021-06-28 DIAGNOSIS — I480 Paroxysmal atrial fibrillation: Secondary | ICD-10-CM

## 2021-06-28 NOTE — Patient Instructions (Signed)
Medication Instructions:  Your physician recommends that you continue on your current medications as directed. Please refer to the Current Medication list given to you today.  *If you need a refill on your cardiac medications before your next appointment, please call your pharmacy*   Lab Work: None ordered   Testing/Procedures: Your physician has recommended that you have an ablation. Catheter ablation is a medical procedure used to treat some cardiac arrhythmias (irregular heartbeats). During catheter ablation, a long, thin, flexible tube is put into a blood vessel in your groin (upper thigh), or neck. This tube is called an ablation catheter. It is then guided to your heart through the blood vessel. Radio frequency waves destroy small areas of heart tissue where abnormal heartbeats may cause an arrhythmia to start.   Please call  the office if you are interested in proceeding with this procedure   Follow-Up: At [facility], you and your health needs are our priority.  As part of our continuing mission to provide you with exceptional heart care, we have created designated Provider Care Teams.  These Care Teams include your primary Cardiologist (physician) and Advanced Practice Providers (APPs -  Physician Assistants and Nurse Practitioners) who all work together to provide you with the care you need, when you need it.  Your next appointment:   To be  determined  The format for your next appointment:   In Person  Provider:   Allegra Lai, MD    Thank you for choosing [facility]!!   Trinidad Curet, RN [phone]  Other Instructions   Important Information About Sugar      Cardiac Ablation Cardiac ablation is a procedure to destroy (ablate) some heart tissue that is sending bad signals. These bad signals cause problems in heart rhythm. The heart has many areas that make these signals. If there are problems in these areas, they can make the heart beat in a way that  is not normal. Destroying some tissues can help make the heart rhythm normal. Tell your doctor about: Any allergies you have. All medicines you are taking. These include vitamins, herbs, eye drops, creams, and over-the-counter medicines. Any problems you or family members have had with medicines that make you fall asleep (anesthetics). Any blood disorders you have. Any surgeries you have had. Any medical conditions you have, such as kidney failure. Whether you are pregnant or may be pregnant. What are the risks? This is a safe procedure. But problems may occur, including: Infection. Bruising and bleeding. Bleeding into the chest. Stroke or blood clots. Damage to nearby areas of your body. Allergies to medicines or dyes. The need for a pacemaker if the normal system is damaged. Failure of the procedure to treat the problem. What happens before the procedure? Medicines Ask your doctor about: Changing or stopping your normal medicines. This is important. Taking aspirin and ibuprofen. Do not take these medicines unless your doctor tells you to take them. Taking other medicines, vitamins, herbs, and supplements. General instructions Follow instructions from your doctor about what you cannot eat or drink. Plan to have someone take you home from the hospital or clinic. If you will be going home right after the procedure, plan to have someone with you for 24 hours. Ask your doctor what steps will be taken to prevent infection. What happens during the procedure?  An IV tube will be put into one of your veins. You will be given a medicine to help you relax. The skin on your neck or  groin will be numbed. A cut (incision) will be made in your neck or groin. A needle will be put through your cut and into a large vein. A tube (catheter) will be put into the needle. The tube will be moved to your heart. Dye may be put through the tube. This helps your doctor see your heart. Small devices  (electrodes) on the tube will send out signals. A type of energy will be used to destroy some heart tissue. The tube will be taken out. Pressure will be held on your cut. This helps stop bleeding. A bandage will be put over your cut. The exact procedure may vary among doctors and hospitals. What happens after the procedure? You will be watched until you leave the hospital or clinic. This includes checking your heart rate, breathing rate, oxygen, and blood pressure. Your cut will be watched for bleeding. You will need to lie still for a few hours. Do not drive for 24 hours or as long as your doctor tells you. Summary Cardiac ablation is a procedure to destroy some heart tissue. This is done to treat heart rhythm problems. Tell your doctor about any medical conditions you may have. Tell him or her about all medicines you are taking to treat them. This is a safe procedure. But problems may occur. These include infection, bruising, bleeding, and damage to nearby areas of your body. Follow what your doctor tells you about food and drink. You may also be told to change or stop some of your medicines. After the procedure, do not drive for 24 hours or as long as your doctor tells you. This information is not intended to replace advice given to you by your health care provider. Make sure you discuss any questions you have with your health care provider. Document Revised: 12/06/2018 Document Reviewed: 12/06/2018 Elsevier Patient Education  [place].

## 2021-06-28 NOTE — Progress Notes (Signed)
Electrophysiology Office Note   Date:  06/28/2021   ID:  Denise Grant, DOB [birth year], MRN [id number]  PCP:  Abner Greenspan, MD  Cardiologist:   Primary Electrophysiologist:  Diontre Harps Meredith Leeds, MD    Chief Complaint: AF   History of Present Illness: Denise Grant is a 66 y.o. female who is being seen today for the evaluation of AF at the request of Fenton, Clint R, PA. Presenting today for electrophysiology evaluation.  She has a history significant for Chiari malformation, polymyalgia rheumatica, hypertension, hypothyroidism, mitral valve prolapse, anxiety, paroxysmal atrial fibrillation.  She was diagnosed with atrial fibrillation in 2020 after presenting the emergency room with symptoms of shortness of breath chest discomfort and weakness.  She was found to be in rapid atrial fibrillation and underwent cardioversion.  She has been followed in atrial fibrillation clinic.  She has had a few episodes of atrial fibrillation since that time and is now on flecainide.  She unfortunately developed headaches with flecainide and may wish to stop the medication.  Today, she denies symptoms of palpitations, chest pain, shortness of breath, orthopnea, PND, lower extremity edema, claudication, dizziness, presyncope, syncope, bleeding, or neurologic sequela. The patient is tolerating medications without difficulties.    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.0)    Osteopenia    Seborrheic dermatitis, unspecified    Vaginal cyst    stable   Past Surgical History:  Procedure Laterality Date   AIR/FLUID EXCHANGE Right 07/25/2018   Procedure: Air/Fluid Exchange;  Surgeon: Jalene Mullet, MD;  Location: [facility];  Service:  Ophthalmology;  Laterality: Right;   bone spur removal  10/06   shoulder   CATARACT EXTRACTION, BILATERAL Bilateral 2016   CERVICAL [place] SURGERY  2005   Chiari Malformation surgery  6/03   EXCISION MORTON'S NEUROMA  2/04   GAS INSERTION Right 07/25/2018   Procedure: Insertion Of Gas SF6;  Surgeon: Jalene Mullet, MD;  Location: [facility];  Service: Ophthalmology;  Laterality: Right;   LUMBAR DISC SURGERY     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 90 tablet 3   DENTA 5000 PLUS 1.1 % CREA dental cream Take 1 application by mouth at bedtime.      flecainide (TAMBOCOR) 50 MG tablet Take 1 tablet in the AM and 1/2 tablet in the PM 60 tablet 3   levothyroxine (SYNTHROID) 50 MCG tablet TAKE 1 TABLET BY MOUTH DAILY BEFORE BREAKFAST 90 tablet 3   LORazepam (ATIVAN) 1 MG tablet TAKE A HALF TABLET BY MOUTH AT BEDTIME AS NEEDED 15 tablet 3   metoprolol succinate (TOPROL-XL) 25 MG 24 hr tablet TAKE 1/2 TABLET BY MOUTH EVERY DAY 45 tablet 3   mometasone (ELOCON) 0.1 % ointment Apply topically daily. Apply to itchy areas on the back bid  prn 45 g 1   predniSONE (DELTASONE) 5 MG tablet Take 7.5 mg by mouth.     Tavaborole (KERYDIN) 5 % SOLN Apply to toenails at bedtme 4 mL 2   XARELTO 20 MG TABS tablet TAKE 1 TABLET BY MOUTH DAILY WITH SUPPER. 90 tablet 2   No current facility-administered medications for this visit.    Allergies:   Fentanyl, Buspar [buspirone], Calcium, Fluticasone propionate, Hctz [hydrochlorothiazide], Paroxetine, Zoloft [sertraline], and Hydroxychloroquine   Social History:  The patient   reports that she quit smoking about 20 years ago. Her smoking use included cigarettes. She has been exposed to tobacco smoke. She has never used smokeless tobacco. She reports that she does not drink alcohol and does not use drugs.   Family History:  The patient's family history includes Anxiety disorder in her son; Coronary artery disease in her mother; Heart failure in her mother; Hypertension in her mother; Lung cancer in her brother and father; Melanoma in her sister.    ROS:  Please see the history of present illness.   Otherwise, review of systems is positive for none.   All other systems are reviewed and negative.    PHYSICAL EXAM: VS:  BP 132/70 (BP Location: Left Arm, Patient Position: Sitting, Cuff Size: Normal)   Pulse 69   Ht '5\' 6"'$  (1.676 m)   Wt 155 lb 9.6 oz (70.6 kg)   BMI 25.11 kg/m  , BMI Body mass index is 25.11 kg/m. GEN: Well nourished, well developed, in no acute distress  HEENT: normal  Neck: no JVD, carotid bruits, or masses Cardiac: RRR; no murmurs, rubs, or gallops,no edema  Respiratory:  clear to auscultation bilaterally, normal work of breathing GI: soft, nontender, nondistended, + BS MS: no deformity or atrophy  Skin: warm and dry Neuro:  Strength and sensation are intact Psych: euthymic mood, full affect  EKG:  EKG is ordered today. Personal review of the ekg ordered  shows sinus rhythm   Recent Labs: 08/24/2020: ALT 17 05/04/2021: B Natriuretic Peptide 92.3; BUN 14; Creatinine, Ser 0.91; Hemoglobin 14.6; Magnesium 1.9; Platelets 297; Potassium 3.3; Sodium 137; TSH 2.675    Lipid Panel     Component Value Date/Time   CHOL 155 08/24/2020 0742   TRIG 85.0 08/24/2020 0742   HDL 54.70 08/24/2020 0742   CHOLHDL 3 08/24/2020 0742   VLDL 17.0 08/24/2020 0742   LDLCALC 83 08/24/2020 0742     Wt Readings from Last 3 Encounters:  06/28/21 155 lb 9.6 oz (70.6 kg)  05/11/21 155 lb 3.2 oz (70.4 kg)  03/15/21 158 lb (71.7 kg)      Other studies  Reviewed: Additional studies/ records that were reviewed today include: TTE 09/26/18  Review of the above records today demonstrates:   1. The left ventricle has normal systolic function with an ejection  fraction of 60-65%. The cavity size was normal. Left ventricular diastolic  parameters were normal. No evidence of left ventricular regional wall  motion abnormalities.   2. The right ventricle has normal systolic function. The cavity was  normal. There is no increase in right ventricular wall thickness. Right  ventricular systolic pressure is normal with an estimated pressure of 21.8  mmHg.   3. The pericardial effusion is circumferential.   4. Trivial pericardial effusion is present.   5. Mild thickening of the mitral valve leaflet. There is mild mitral  annular calcification present.   6. Mild thickening of the aortic valve. Aortic valve regurgitation is  mild  by color flow Doppler.   7. The aorta is normal unless otherwise noted.    ASSESSMENT AND PLAN:  1.  Paroxysmal atrial fibrillation: Currently on Xarelto 20 mg daily.  CHA2DS2-VASc of 3.  She is also on flecainide 50 mg twice daily.  High risk medication monitoring for flecainide.  QRS remains narrow.  She would potentially prefer to get off of flecainide.  We discussed options including ablation.  She Steve Youngberg discuss this further with her family and Nile Prisk let us know which she prefers.  2.  Secondary hypercoagulable state: Currently on Xarelto for atrial fibrillation as above.    Current medicines are reviewed at length with the patient today.   The patient does not have concerns regarding her medicines.  The following changes were made today:  none  Labs/ tests ordered today include:  Orders Placed This Encounter  Procedures   EKG 12-Lead     Disposition:   FU with Jaysion Ramseyer 3 months  Signed, Tasheika Kitzmiller Meredith Leeds, MD  06/28/2021 9:32 AM     [address] [place]  [phone] (office) [phone] (fax)

## 2021-06-30 ENCOUNTER — Telehealth: Payer: Self-pay | Admitting: Cardiology

## 2021-06-30 NOTE — Telephone Encounter (Signed)
Patient has decided to go ahead with the ablation. She would like to schedule it.

## 2021-07-01 NOTE — Telephone Encounter (Signed)
Aware I will be in touch to schedule procedure date. Patient verbalized understanding and agreeable to plan.

## 2021-07-05 ENCOUNTER — Ambulatory Visit: Payer: Medicare HMO | Admitting: Dermatology

## 2021-07-05 ENCOUNTER — Encounter: Payer: Self-pay | Admitting: Dermatology

## 2021-07-05 DIAGNOSIS — D692 Other nonthrombocytopenic purpura: Secondary | ICD-10-CM

## 2021-07-05 DIAGNOSIS — L720 Epidermal cyst: Secondary | ICD-10-CM | POA: Diagnosis not present

## 2021-07-05 DIAGNOSIS — L578 Other skin changes due to chronic exposure to nonionizing radiation: Secondary | ICD-10-CM | POA: Diagnosis not present

## 2021-07-05 DIAGNOSIS — L820 Inflamed seborrheic keratosis: Secondary | ICD-10-CM | POA: Diagnosis not present

## 2021-07-05 DIAGNOSIS — Z872 Personal history of diseases of the skin and subcutaneous tissue: Secondary | ICD-10-CM | POA: Diagnosis not present

## 2021-07-05 NOTE — Patient Instructions (Addendum)
Aklief samples given to be applied at bedtime, wash off in morning.   Topical retinoid medications like tretinoin/Retin-A, adapalene/Differin, tazarotene/Fabior, and Epiduo/Epiduo Forte can cause dryness and irritation when first started. Only apply a pea-sized amount to the entire affected area. Avoid applying it around the eyes, edges of mouth and creases at the nose. If you experience irritation, use a good moisturizer first and/or apply the medicine less often. If you are doing well with the medicine, you can increase how often you use it until you are applying every night. Be careful with sun protection while using this medication as it can make you sensitive to the sun. This medicine should not be used by pregnant women.     Cryotherapy Aftercare  Wash gently with soap and water everyday.   Apply Vaseline and Band-Aid daily until healed.   Prior to procedure, discussed risks of blister formation, small wound, skin dyspigmentation, or rare scar following cryotherapy. Recommend Vaseline ointment to treated areas while healing.   Due to recent changes in healthcare laws, you may see results of your pathology and/or laboratory studies on MyChart before the doctors have had a chance to review them. We understand that in some cases there may be results that are confusing or concerning to you. Please understand that not all results are received at the same time and often the doctors may need to interpret multiple results in order to provide you with the best plan of care or course of treatment. Therefore, we ask that you please give [place] 2 business days to thoroughly review all your results before contacting the office for clarification. Should we see a critical lab result, you will be contacted sooner.   If You Need Anything After Your Visit  If you have any questions or concerns for your doctor, please call our main line at [phone] and press option 4 to reach your doctor's medical assistant. If  no one answers, please leave a voicemail as directed and we will return your call as soon as possible. Messages left after 4 pm will be answered the following business day.   You may also send [place] a message via [place]. We typically respond to MyChart messages within 1-2 business days.  For prescription refills, please ask your pharmacy to contact our office. Our fax number is [phone].  If you have an urgent issue when the clinic is closed that cannot wait until the next business day, you can page your doctor at the number below.    Please note that while we do our best to be available for urgent issues outside of office hours, we are not available 24/7.   If you have an urgent issue and are unable to reach [place], you may choose to seek medical care at your doctor's office, retail clinic, urgent care center, or emergency room.  If you have a medical emergency, please immediately call 911 or go to the emergency department.  Pager Numbers  - Dr. Nehemiah Massed: [phone]  - Dr. Laurence Ferrari: [phone]  - Dr. Nicole Kindred: [phone]  In the event of inclement weather, please call our main line at [phone] for an update on the status of any delays or closures.  Dermatology Medication Tips: Please keep the boxes that topical medications come in in order to help keep track of the instructions about where and how to use these. Pharmacies typically print the medication instructions only on the boxes and not directly on the medication tubes.   If your medication is too expensive,  please contact our office at [phone] option 4 or send [place] a message through [place].   We are unable to tell what your co-pay for medications will be in advance as this is different depending on your insurance coverage. However, we may be able to find a substitute medication at lower cost or fill out paperwork to get insurance to cover a needed medication.   If a prior authorization is required to get your medication  covered by your insurance company, please allow [place] 1-2 business days to complete this process.  Drug prices often vary depending on where the prescription is filled and some pharmacies may offer cheaper prices.  The website www.goodrx.com contains coupons for medications through different pharmacies. The prices here do not account for what the cost may be with help from insurance (it may be cheaper with your insurance), but the website can give you the price if you did not use any insurance.  - You can print the associated coupon and take it with your prescription to the pharmacy.  - You may also stop by our office during regular business hours and pick up a GoodRx coupon card.  - If you need your prescription sent electronically to a different pharmacy, notify our office through [facility] or by phone at [phone] option 4.     Si Usted Necesita Algo Despus de Su Visita  Tambin puede enviarnos un mensaje a travs de Pharmacist, community. Por lo general respondemos a los mensajes de MyChart en el transcurso de 1 a 2 das hbiles.  Para renovar recetas, por favor pida a su farmacia que se ponga en contacto con nuestra oficina. Harland Dingwall de fax es [place] [phone].  Si tiene un asunto urgente cuando la clnica est cerrada y que no puede esperar hasta el siguiente da hbil, puede llamar/localizar a su doctor(a) al nmero que aparece a continuacin.   Por favor, tenga en cuenta que aunque hacemos todo lo posible para estar disponibles para asuntos urgentes fuera del horario de [place], no estamos disponibles las 24 horas del da, los 7 das de la [place].   Si tiene un problema urgente y no puede comunicarse con nosotros, puede optar por buscar atencin mdica  en el consultorio de su doctor(a), en una clnica privada, en un centro de atencin urgente o en una sala de emergencias.  Si tiene Engineering geologist, por favor llame inmediatamente al 911 o vaya a la sala de  emergencias.  Nmeros de bper  - Dr. Nehemiah Massed: [phone]  - Dra. Moye: [phone]  - Dra. Nicole Kindred: [phone]  En caso de inclemencias del [place], por favor llame a Johnsie Kindred principal al [phone] para una actualizacin sobre el [place] de cualquier retraso o cierre.  Consejos para la medicacin en dermatologa: Por favor, guarde las cajas en las que vienen los medicamentos de uso tpico para ayudarle a seguir las instrucciones sobre dnde y cmo usarlos. Las farmacias generalmente imprimen las instrucciones del medicamento slo en las cajas y no directamente en los tubos del [place].   Si su medicamento es muy caro, por favor, pngase en contacto con Zigmund Daniel llamando al [phone] y presione la opcin 4 o envenos un mensaje a travs de Pharmacist, community.   No podemos decirle cul ser su copago por los medicamentos por adelantado ya que esto es diferente dependiendo de la cobertura de su seguro. Sin embargo, es posible que podamos encontrar un medicamento sustituto a Electrical engineer un formulario para que el [place]  el medicamento que se considera necesario.   Si se requiere una autorizacin previa para que su compaa de seguros [place] su medicamento, por favor permtanos de 1 a 2 das hbiles para completar este proceso.  Los precios de los medicamentos varan con frecuencia dependiendo del Environmental consultant de dnde se surte la receta y alguna farmacias pueden ofrecer precios ms baratos.  El sitio web www.goodrx.com tiene cupones para medicamentos de Airline pilot. Los precios aqu no tienen en cuenta lo que podra costar con la ayuda del seguro (puede ser ms barato con su seguro), pero el sitio web puede darle el precio si no utiliz Research scientist (physical sciences).  - Puede imprimir el cupn correspondiente y llevarlo con su receta a la farmacia.  - Tambin puede pasar por nuestra oficina durante el horario de atencin regular y Charity fundraiser una tarjeta de cupones de GoodRx.  - Si  necesita que su receta se enve electrnicamente a una farmacia diferente, informe a nuestra oficina a travs de MyChart de [place] o por telfono llamando al [phone] y presione la opcin 4.

## 2021-07-05 NOTE — Progress Notes (Unsigned)
   Follow-Up Visit   Subjective  BELLANY ELBAUM is a 66 y.o. female who presents for the following: Actinic Keratosis (3 month recheck, mid chest sternum. Tx with LN2 at last visit. Recheck ISK, right underside breast). The patient has spots, moles and lesions to be evaluated, some may be new or changing and the patient has concerns that these could be cancer.  The following portions of the chart were reviewed this encounter and updated as appropriate:  Tobacco  Allergies  Meds  Problems  Med Hx  Surg Hx  Fam Hx     Review of Systems: No other skin or systemic complaints except as noted in HPI or Assessment and Plan.  Objective  Well appearing patient in no apparent distress; mood and affect are within normal limits.  A focused examination was performed including face, chest. Relevant physical exam findings are noted in the Assessment and Plan.  Right Underside Breast x1 Erythematous keratotic or waxy stuck-on papule or plaque.  Chest - Medial (Center) Pink smooth macule  Right Buccal Cheek Smooth white papule(s).    Assessment & Plan   Actinic Damage - chronic, secondary to cumulative UV radiation exposure/sun exposure over time - diffuse scaly erythematous macules with underlying dyspigmentation - Recommend daily broad spectrum sunscreen SPF 30+ to sun-exposed areas, reapply every 2 hours as needed.  - Recommend staying in the shade or wearing long sleeves, sun glasses (UVA+UVB protection) and wide brim hats (4-inch brim around the entire circumference of the hat). - Call for new or changing lesions.  Purpura - Chronic; persistent and recurrent.  Treatable, but not curable. - Violaceous macules and patches - Benign - Related to trauma, age, sun damage and/or use of blood thinners, chronic use of topical and/or oral steroids - Observe - Can use OTC arnica containing moisturizer such as Dermend Bruise Formula if desired - Call for worsening or other  concerns  Inflamed seborrheic keratosis Right Underside Breast x1 Symptomatic, irritating, patient would like treated.  Destruction of lesion - Right Underside Breast x1 Complexity: simple   Destruction method: cryotherapy   Informed consent: discussed and consent obtained   Timeout:  patient name, date of birth, surgical site, and procedure verified Lesion destroyed using liquid nitrogen: Yes   Region frozen until ice ball extended beyond lesion: Yes   Outcome: patient tolerated procedure well with no complications   Post-procedure details: wound care instructions given   Additional details:  Prior to procedure, discussed risks of blister formation, small wound, skin dyspigmentation, or rare scar following cryotherapy. Recommend Vaseline ointment to treated areas while healing.  History of actinic keratosis Chest - Medial (Center) Clear today Watch for recurrence.   [place] Right Buccal Cheek Aklief samples given to be applied at bedtime, wash off in morning.   Return for TBSE As Scheduled.  I, Emelia Salisbury, CMA, am acting as scribe for Sarina Ser, MD. Documentation: I have reviewed the above documentation for accuracy and completeness, and I agree with the above.  Sarina Ser, MD

## 2021-07-06 ENCOUNTER — Encounter: Payer: Self-pay | Admitting: Dermatology

## 2021-07-14 ENCOUNTER — Ambulatory Visit (HOSPITAL_COMMUNITY)
Admission: RE | Admit: 2021-07-14 | Discharge: 2021-07-14 | Disposition: A | Discharge status: Home / Self Care | Payer: Medicare HMO | Source: Ambulatory Visit | Attending: Physician Assistant | Admitting: Physician Assistant

## 2021-07-14 ENCOUNTER — Encounter (HOSPITAL_COMMUNITY): Payer: Self-pay | Admitting: Physician Assistant

## 2021-07-14 VITALS — BP 132/72 | HR 68 | Ht 66.0 in | Wt 152.4 lb

## 2021-07-14 DIAGNOSIS — Z7901 Long term (current) use of anticoagulants: Secondary | ICD-10-CM | POA: Insufficient documentation

## 2021-07-14 DIAGNOSIS — Z79899 Other long term (current) drug therapy: Secondary | ICD-10-CM | POA: Diagnosis not present

## 2021-07-14 DIAGNOSIS — F411 Generalized anxiety disorder: Secondary | ICD-10-CM | POA: Insufficient documentation

## 2021-07-14 DIAGNOSIS — I10 Essential (primary) hypertension: Secondary | ICD-10-CM | POA: Diagnosis not present

## 2021-07-14 DIAGNOSIS — M353 Polymyalgia rheumatica: Secondary | ICD-10-CM | POA: Diagnosis not present

## 2021-07-14 DIAGNOSIS — I351 Nonrheumatic aortic (valve) insufficiency: Secondary | ICD-10-CM | POA: Diagnosis not present

## 2021-07-14 DIAGNOSIS — I3481 Nonrheumatic mitral (valve) annulus calcification: Secondary | ICD-10-CM | POA: Insufficient documentation

## 2021-07-14 DIAGNOSIS — D6869 Other thrombophilia: Secondary | ICD-10-CM

## 2021-07-14 DIAGNOSIS — E039 Hypothyroidism, unspecified: Secondary | ICD-10-CM | POA: Insufficient documentation

## 2021-07-14 DIAGNOSIS — I480 Paroxysmal atrial fibrillation: Secondary | ICD-10-CM | POA: Diagnosis not present

## 2021-07-14 DIAGNOSIS — R69 Illness, unspecified: Secondary | ICD-10-CM | POA: Diagnosis not present

## 2021-07-14 NOTE — Progress Notes (Signed)
Primary Care Physician: Tower, Wynelle Fanny, MD Primary Cardiologist: none Primary Electrophysiologist: Dr Curt Bears Referring Physician: [facility]   Denise Grant is a 66 y.o. female with a history of Chiari malformation, polymyalgia rheumatica, HTN, hypothyroidism, MVP, generalized anxiety, and paroxysmal atrial fibrillation who presents for follow up in the [facility]. The patient was initially diagnosed with atrial fibrillation on 09/19/18 after presenting to the ER with symptoms of SOB, chest discomfort, and weakness. She was found to be in afib with RVR and underwent successful DCCV. There were no specific triggers that the patient could identify. She denies snoring or significant alcohol use. She had an echo 09/26/18 which showed preserved EF and a Zio patch 10/01/18 which showed no afib. Patient presented to the ED on 10/12/19 with symptoms of heart racing, nausea, and SOB. ECG showed afib with RVR. She underwent DCCV at that time. Patient was seen at the ED 05/04/21 for rapid afib and underwent urgent DCCV. Started on flecainide. She was seen by Dr Curt Bears 05/11/21 and planned for ablation.   On follow up today, patient reports that she has done well on low dose flecainide with only one brief episode of afib. No bleeding issues on anticoagulation.   Today, she denies symptoms of palpitations, chest pain, shortness of breath, orthopnea, PND, lower extremity edema, dizziness, presyncope, syncope, snoring, daytime somnolence, bleeding, or neurologic sequela. The patient is tolerating medications without difficulties and is otherwise without complaint today.    Atrial Fibrillation Risk Factors:  she does not have symptoms or diagnosis of sleep apnea. she does not have a history of rheumatic fever. she does not have a history of alcohol use. The patient does have a history of early familial atrial fibrillation or other arrhythmias. Sister has afib, brother has  PPM.  she has a BMI of Body mass index is 24.6 kg/m.Marland Kitchen Filed Weights   07/14/21 0929  Weight: 69.1 kg    Family History  Problem Relation Age of Onset   Melanoma Sister    Lung cancer Father    Coronary artery disease Mother    Heart failure Mother    Hypertension Mother    Anxiety disorder Son    Lung cancer Brother        + smoker   Breast cancer Neg Hx      Atrial Fibrillation Management history:  Previous antiarrhythmic drugs: flecainide  Previous cardioversions: 09/19/18, 10/12/19, 05/04/21 Previous ablations: none CHADS2VASC score: 3 Anticoagulation history: Xarelto   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.0)    Osteopenia    Seborrheic dermatitis, unspecified    Vaginal cyst    stable   Past Surgical History:  Procedure Laterality Date   AIR/FLUID EXCHANGE Right 07/25/2018   Procedure: Air/Fluid Exchange;  Surgeon: Jalene Mullet, MD;  Location: [place];  Service: Ophthalmology;  Laterality: Right;   bone spur removal  10/06   shoulder   CATARACT EXTRACTION, BILATERAL Bilateral 2016   CERVICAL [place] SURGERY  2005   Chiari Malformation surgery  6/03   EXCISION MORTON'S NEUROMA  2/04   GAS INSERTION Right 07/25/2018   Procedure: Insertion Of Gas SF6;  Surgeon: Posey Pronto,  Dareen Piano, MD;  Location: [place];  Service: Ophthalmology;  Laterality: Right;   LUMBAR DISC SURGERY     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 90 tablet 3   DENTA 5000 PLUS 1.1 % CREA dental cream Take 1 application by mouth at bedtime.      flecainide (TAMBOCOR) 50 MG tablet Take 1 tablet in the AM and 1/2 tablet in the PM (Patient taking differently: Take 1/2 tablet in the AM and 1/2 tablet in the PM) 60 tablet 3   levothyroxine (SYNTHROID) 50 MCG tablet TAKE 1 TABLET BY MOUTH DAILY BEFORE BREAKFAST 90 tablet 3   LORazepam (ATIVAN) 1 MG tablet TAKE A HALF TABLET BY MOUTH AT BEDTIME AS NEEDED 15 tablet 3   metoprolol succinate (TOPROL-XL) 25 MG 24 hr tablet TAKE 1/2 TABLET BY MOUTH EVERY DAY 45 tablet 3   mometasone (ELOCON) 0.1 % ointment Apply topically daily. Apply to itchy areas on the back bid prn 45 g 1   predniSONE (DELTASONE) 5 MG tablet Take 7.5 mg by mouth.     Tavaborole (KERYDIN) 5 % SOLN Apply to toenails at bedtme 4 mL 2   XARELTO 20 MG TABS tablet TAKE 1 TABLET BY MOUTH DAILY WITH SUPPER. 90 tablet 2   No current facility-administered medications for this encounter.    Allergies  Allergen Reactions   Fentanyl Other (See Comments)    Seizure    Buspar [Buspirone]     Felt funny   Calcium     REACTION: cannot tolerate any calcium supplement - GI sympotms   Fluticasone Propionate     REACTION: headache/irritation   Hctz [Hydrochlorothiazide] Other (See Comments)    Eye redness and discomfort/ slt blurred vision    Paroxetine     REACTION: abdominal pain and constipation   Zoloft [Sertraline]     Diarrhea    Hydroxychloroquine Itching and Rash    Social History   Socioeconomic History   Marital status: Widowed    Spouse name: Not on file   Number of children: Not on file   Years of education: Not on file   Highest education level: Not on file  Occupational History   Not on file  Tobacco Use   Smoking status: Former    Types: Cigarettes    Quit date: 01/17/2001    Years since quitting: 20.5     Passive exposure: Past   Smokeless tobacco: Never   Tobacco comments:    Former smoker 01/13/2021  Substance and Sexual Activity   Alcohol use: No    Alcohol/week: 0.0 standard drinks of alcohol   Drug use: No   Sexual activity: Not on file  Other Topics Concern   Not on file  Social History Narrative   Walks daily for exercise      Married--husband diagnosed with stomach cancer (5/11)   Lost her husband 4/12 to cancer    Social Determinants of Health   Financial Resource Strain: Not on file  Food Insecurity: Not on file  Transportation Needs: Not on file  Physical Activity: Not on file  Stress: Not on file  Social Connections: Not on file  Intimate Partner Violence: Not on file     ROS- All systems are reviewed and negative except as per the HPI above.  Physical Exam: Vitals:   07/14/21 0929  BP: 132/72  Pulse: 68  Weight: 69.1 kg  Height: '5\' 6"'$  (1.676 m)     GEN- The patient is a well appearing female, alert and oriented x 3 today.   HEENT-head normocephalic, atraumatic, sclera clear, conjunctiva pink, hearing intact, trachea midline. Lungs- Clear to ausculation bilaterally, normal work of breathing Heart- Regular rate and rhythm, no murmurs, rubs or gallops  GI- soft, NT, ND, + BS Extremities- no clubbing, cyanosis, or edema MS- no significant deformity or atrophy Skin- no rash or lesion Psych- euthymic mood, full affect Neuro- strength and sensation are intact   Wt Readings from Last 3 Encounters:  07/14/21 69.1 kg  06/28/21 70.6 kg  05/11/21 70.4 kg    EKG today demonstrates  SR Vent. rate 68 BPM PR interval 162 ms QRS duration 82 ms QT/QTcB 410/435 ms  Echo 09/26/18 1. The left ventricle has normal systolic function with an ejection fraction of 60-65%. The cavity size was normal. Left ventricular diastolic parameters were normal. No evidence of left ventricular regional wall motion abnormalities.  2. The right ventricle has normal systolic  function. The cavity was normal. There is no increase in right ventricular wall thickness. Right ventricular systolic pressure is normal with an estimated pressure of 21.8 mmHg.  3. The pericardial effusion is circumferential.  4. Trivial pericardial effusion is present.  5. Mild thickening of the mitral valve leaflet. There is mild mitral annular calcification present.  6. Mild thickening of the aortic valve. Aortic valve regurgitation is mild by color flow Doppler.  7. The aorta is normal unless otherwise noted.  Epic records are reviewed at length today   CHA2DS2-VASc Score = 3  The patient's score is based upon: CHF History: 0 HTN History: 1 Diabetes History: 0 Stroke History: 0 Vascular Disease History: 0 Age Score: 1 Gender Score: 1       ASSESSMENT AND PLAN: 1. Paroxysmal Atrial Fibrillation (ICD10:  I48.0) The patient's CHA2DS2-VASc score is 3, indicating a 3.2% annual risk of stroke.   Patient in [place] today.  Seen by Dr Curt Bears, planned for ablation, will reach out for scheduling.  Continue flecainide 25 mg BID for now (had headaches at higher dose) Smart watch for home monitoring.  Continue Toprol 12.5 mg daily Continue Xarelto 20 mg daily  2. Secondary Hypercoagulable State (ICD10:  D68.69) The patient is at significant risk for stroke/thromboembolism based upon her CHA2DS2-VASc Score of 3.  Continue Rivaroxaban (Xarelto).   3. HTN Stable, no change today.   Follow up with Dr Curt Bears for ablation.    [facility] [address] [place], [place] [address] [phone] 07/14/2021 9:50 AM

## 2021-07-22 NOTE — Telephone Encounter (Signed)
Patient called to speak to Sherri to schedule ablation. Advised patient Nurse was not in the office and she will reach out to patient when nurse has designated follow up time

## 2021-07-27 NOTE — Telephone Encounter (Signed)
Scheduled pt Afib ablation 10/24. Aware I will be in touch to review instructions, will send via mychart. Patient verbalized understanding and agreeable to plan.

## 2021-07-27 NOTE — Telephone Encounter (Signed)
Pt is returning call. Requesting call back.  

## 2021-07-27 NOTE — Telephone Encounter (Signed)
Left message to call back  

## 2021-07-28 DIAGNOSIS — M353 Polymyalgia rheumatica: Secondary | ICD-10-CM | POA: Diagnosis not present

## 2021-07-29 ENCOUNTER — Telehealth (HOSPITAL_COMMUNITY): Payer: Self-pay | Admitting: Physician Assistant

## 2021-07-29 NOTE — Telephone Encounter (Signed)
Patient called in to report that she has frequently had HR in the low 40s associated with "lightheadedness". She is already on very low doses of metoprolol and flecainide. Will discontinue metoprolol and flecainide for now and use PRN if she goes back into afib before her ablation. Patient voices understanding and is in agreement with plan.

## 2021-08-03 ENCOUNTER — Telehealth (HOSPITAL_COMMUNITY): Payer: Self-pay

## 2021-08-03 NOTE — Telephone Encounter (Signed)
Patient called asking about message she left on 07/17. She advised that her HR increased to 100s after stopping Metoprolol and Flecainide. She went back on 12.'5mg'$  of Metoprolol. [name] advised that if she does go back into Afib to resume Flecainide.

## 2021-08-16 ENCOUNTER — Ambulatory Visit (INDEPENDENT_AMBULATORY_CARE_PROVIDER_SITE_OTHER): Payer: Medicare HMO | Admitting: Family

## 2021-08-16 ENCOUNTER — Ambulatory Visit (INDEPENDENT_AMBULATORY_CARE_PROVIDER_SITE_OTHER)
Admission: RE | Admit: 2021-08-16 | Discharge: 2021-08-16 | Disposition: A | Discharge status: Home / Self Care | Payer: Medicare HMO | Source: Ambulatory Visit | Attending: Family | Admitting: Family

## 2021-08-16 ENCOUNTER — Other Ambulatory Visit: Payer: Self-pay | Admitting: Family

## 2021-08-16 ENCOUNTER — Encounter: Payer: Self-pay | Admitting: Family

## 2021-08-16 ENCOUNTER — Ambulatory Visit
Admission: RE | Admit: 2021-08-16 | Discharge: 2021-08-16 | Disposition: A | Discharge status: Home / Self Care | Payer: Medicare HMO | Source: Ambulatory Visit | Attending: Family | Admitting: Family

## 2021-08-16 VITALS — BP 134/62 | HR 84 | Temp 98.3°F | Resp 16 | Ht 66.0 in | Wt 152.1 lb

## 2021-08-16 DIAGNOSIS — M7989 Other specified soft tissue disorders: Secondary | ICD-10-CM

## 2021-08-16 DIAGNOSIS — M25561 Pain in right knee: Secondary | ICD-10-CM

## 2021-08-16 DIAGNOSIS — M79661 Pain in right lower leg: Secondary | ICD-10-CM | POA: Insufficient documentation

## 2021-08-16 DIAGNOSIS — M25461 Effusion, right knee: Secondary | ICD-10-CM | POA: Diagnosis not present

## 2021-08-16 DIAGNOSIS — M79604 Pain in right leg: Secondary | ICD-10-CM | POA: Diagnosis not present

## 2021-08-16 DIAGNOSIS — M7121 Synovial cyst of popliteal space [Baker], right knee: Secondary | ICD-10-CM

## 2021-08-16 NOTE — Assessment & Plan Note (Signed)
R/o dvt with US venous doppler rle

## 2021-08-16 NOTE — Assessment & Plan Note (Addendum)
R/o dvt with stat u/s venous doppler today  Pending results  If any sob cp go to er immediately   Pt is on xarelto

## 2021-08-16 NOTE — Progress Notes (Signed)
Established Patient Office Visit  Subjective:  Patient ID: Denise Grant, female    DOB: [birth year]  Age: 66 y.o. MRN: [id number]  CC:  Chief Complaint  Patient presents with   Leg Swelling    Right knee goes down calf. X 2 days no falls or injuries     HPI Denise Grant is here today with concerns.   Right knee with pain and swelling.  Worse with walking.  No known injury or trauma Does have rheumatologist for RA, taking prednisone   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.0)    Osteopenia    Seborrheic dermatitis, unspecified    Vaginal cyst    stable    Past Surgical History:  Procedure Laterality Date   AIR/FLUID EXCHANGE Right 07/25/2018   Procedure: Air/Fluid Exchange;  Surgeon: Jalene Mullet, MD;  Location: [facility];  Service: Ophthalmology;  Laterality: Right;   bone spur removal  10/06   shoulder   CATARACT EXTRACTION, BILATERAL Bilateral 2016   CERVICAL [place] SURGERY  2005   Chiari Malformation surgery  6/03   EXCISION MORTON'S NEUROMA  2/04   GAS INSERTION Right 07/25/2018   Procedure: Insertion Of Gas SF6;  Surgeon: Jalene Mullet, MD;  Location: [facility];  Service: Ophthalmology;  Laterality: Right;   LUMBAR DISC SURGERY     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   Procedure: REPAIR OF COMPLEX TRACTION RETINAL DETACHMENT - 25GA VITRECTOMY, ENDOLASER, DRAINAGE OF SUBRETINAL FLUID;  Surgeon: Jalene Mullet, MD;  Location: [facility];  Service: Ophthalmology;  Laterality: Right;   TUBAL LIGATION      Family History  Problem Relation Age of Onset   Melanoma Sister    Lung cancer Father    Coronary artery disease Mother    Heart failure Mother    Hypertension Mother     Anxiety disorder Son    Lung cancer Brother        + smoker   Breast cancer Neg Hx     Social History   Socioeconomic History   Marital status: Widowed    Spouse name: Not on file   Number of children: Not on file   Years of education: Not on file   Highest education level: Not on file  Occupational History   Not on file  Tobacco Use   Smoking status: Former    Types: Cigarettes    Quit date: 01/17/2001    Years since quitting: 20.5    Passive exposure: Past   Smokeless tobacco: Never   Tobacco comments:    Former smoker 01/13/2021  Substance and Sexual Activity   Alcohol use: No    Alcohol/week: 0.0 standard drinks of alcohol   Drug use: No   Sexual activity: Not on file  Other Topics Concern   Not on file  Social History Narrative   Walks daily for exercise      Married--husband diagnosed with stomach cancer (5/11)   Lost her husband 4/12 to cancer    Social Determinants of Health   Financial Resource Strain: Not on file  Food Insecurity: Not on file  Transportation Needs: Not on file  Physical Activity: Not on  file  Stress: Not on file  Social Connections: Not on file  Intimate Partner Violence: Not on file    Outpatient Medications Prior to Visit  Medication Sig Dispense Refill   amLODipine (NORVASC) 5 MG tablet Take 1 tablet (5 mg total) by mouth daily. 90 tablet 3   DENTA 5000 PLUS 1.1 % CREA dental cream Take 1 application by mouth at bedtime.      levothyroxine (SYNTHROID) 50 MCG tablet TAKE 1 TABLET BY MOUTH DAILY BEFORE BREAKFAST 90 tablet 3   LORazepam (ATIVAN) 1 MG tablet TAKE A HALF TABLET BY MOUTH AT BEDTIME AS NEEDED 15 tablet 3   metoprolol succinate (TOPROL-XL) 25 MG 24 hr tablet TAKE 1/2 TABLET BY MOUTH EVERY DAY 45 tablet 3   mometasone (ELOCON) 0.1 % ointment Apply topically daily. Apply to itchy areas on the back bid prn 45 g 1   predniSONE (DELTASONE) 5 MG tablet Take 7.5 mg by mouth.     Tavaborole (KERYDIN) 5 % SOLN Apply to toenails at  bedtme 4 mL 2   XARELTO 20 MG TABS tablet TAKE 1 TABLET BY MOUTH DAILY WITH SUPPER. 90 tablet 2   flecainide (TAMBOCOR) 50 MG tablet Take 1 tablet in the AM and 1/2 tablet in the PM (Patient taking differently: Take 1/2 tablet in the AM and 1/2 tablet in the PM) 60 tablet 3   No facility-administered medications prior to visit.    Allergies  Allergen Reactions   Fentanyl Other (See Comments)    Seizure    Buspar [Buspirone]     Felt funny   Calcium     REACTION: cannot tolerate any calcium supplement - GI sympotms   Fluticasone Propionate     REACTION: headache/irritation   Hctz [Hydrochlorothiazide] Other (See Comments)    Eye redness and discomfort/ slt blurred vision    Paroxetine     REACTION: abdominal pain and constipation   Zoloft [Sertraline]     Diarrhea    Hydroxychloroquine Itching and Rash        Objective:    Physical Exam Constitutional:      General: Denise Grant is not in acute distress.    Appearance: Normal appearance. Denise Grant is normal weight. Denise Grant is not ill-appearing, toxic-appearing or diaphoretic.  Pulmonary:     Effort: Pulmonary effort is normal.  Musculoskeletal:     Right knee: Swelling (lower left patella as well as upper right patella with tenderness) and effusion present. No erythema. Normal range of motion.     Instability Tests: Anterior drawer test negative.     Right lower leg: Tenderness (right lower calf tenderness) present.     Comments: Tenderness posterior right knee, no cyst or fluid filled sac palpated  Neurological:     Mental Status: Denise Grant is alert.     BP 134/62   Pulse 84   Temp 98.3 F (36.8 C)   Resp 16   Ht '5\' 6"'$  (1.676 m)   Wt 152 lb 2 oz (69 kg)   SpO2 98%   BMI 24.55 kg/m  Wt Readings from Last 3 Encounters:  08/16/21 152 lb 2 oz (69 kg)  07/14/21 152 lb 6.4 oz (69.1 kg)  06/28/21 155 lb 9.6 oz (70.6 kg)     Health Maintenance Due  Topic Date Due   Hepatitis C Screening  Never done   Zoster Vaccines- Shingrix (1 of  2) Never done   Pneumonia Vaccine 30+ Years old (1 - PCV) Never done   COVID-19 Vaccine (  3 - Moderna risk series) 06/01/2019    There are no preventive care reminders to display for this patient.  Lab Results  Component Value Date   TSH 2.675 05/04/2021   Lab Results  Component Value Date   WBC 9.1 05/04/2021   HGB 14.6 05/04/2021   HCT 44.0 05/04/2021   MCV 88.0 05/04/2021   PLT 297 05/04/2021   Lab Results  Component Value Date   NA 137 05/04/2021   K 3.3 (L) 05/04/2021   CO2 20 (L) 05/04/2021   GLUCOSE 103 (H) 05/04/2021   BUN 14 05/04/2021   CREATININE 0.91 05/04/2021   BILITOT 0.5 08/24/2020   ALKPHOS 58 08/24/2020   AST 15 08/24/2020   ALT 17 08/24/2020   PROT 6.3 08/24/2020   ALBUMIN 3.8 08/24/2020   CALCIUM 10.1 05/04/2021   ANIONGAP 13 05/04/2021   GFR 79.13 08/24/2020   No results found for: "HGBA1C"    Assessment & Plan:   Problem List Items Addressed This Visit       Other   Acute pain of right knee    xray right knee pending results Suspected RA flare, elevated leg, ice prn Wear compressive sleeve Tylenol arthritis prn  F/u with rheumatologist if ongoing      Relevant Orders   DG Knee Complete 4 Views Right   US Venous Img Lower Unilateral Right (DVT)   Pain and swelling of right lower leg - Primary    R/o dvt with stat u/s venous doppler today  Pending results  If any sob cp go to er immediately   Pt is on xarelto      Relevant Orders   US Venous Img Lower Unilateral Right (DVT)   Posterior knee pain, right    R/o dvt with US venous doppler rle       No orders of the defined types were placed in this encounter.   Follow-up: Return if symptoms worsen or fail to improve with pcp and rheumatologist.    Eugenia Pancoast, FNP

## 2021-08-16 NOTE — Patient Instructions (Signed)
Complete xray(s) prior to leaving today. I will notify you of your results once received. Ultrasound today. Pending results  Wear knee sleeve Tylenol arthritis as needed  Due to recent changes in healthcare laws, you may see results of your imaging and/or laboratory studies on MyChart before I have had a chance to review them.  I understand that in some cases there may be results that are confusing or concerning to you. Please understand that not all results are received at the same time and often I may need to interpret multiple results in order to provide you with the best plan of care or course of treatment. Therefore, I ask that you please give me 2 business days to thoroughly review all your results before contacting my office for clarification. Should we see a critical lab result, you will be contacted sooner.   It was a pleasure seeing you today! Please do not hesitate to reach out with any questions and or concerns.  Regards,   Eugenia Pancoast FNP-C

## 2021-08-16 NOTE — Assessment & Plan Note (Signed)
xray right knee pending results Suspected RA flare, elevated leg, ice prn Wear compressive sleeve Tylenol arthritis prn  F/u with rheumatologist if ongoing

## 2021-08-17 ENCOUNTER — Other Ambulatory Visit: Payer: Self-pay | Admitting: Family

## 2021-08-17 DIAGNOSIS — M25561 Pain in right knee: Secondary | ICD-10-CM

## 2021-08-17 DIAGNOSIS — R936 Abnormal findings on diagnostic imaging of limbs: Secondary | ICD-10-CM | POA: Insufficient documentation

## 2021-08-17 DIAGNOSIS — M85861 Other specified disorders of bone density and structure, right lower leg: Secondary | ICD-10-CM | POA: Insufficient documentation

## 2021-08-17 DIAGNOSIS — M25461 Effusion, right knee: Secondary | ICD-10-CM | POA: Insufficient documentation

## 2021-08-17 NOTE — Progress Notes (Signed)
Ordering mri right knee.  Called and spoke with pt in detail.  Pt does not have metal implants, no pacemaker, no implanted device.  Mri in process. Pt to let me know x one week in no word.

## 2021-08-17 NOTE — Progress Notes (Signed)
D/w pt in detail.  Did advise pt to f/u with rheumatology, joint injection in knee may be beneficial for bakers cyst.

## 2021-08-18 DIAGNOSIS — M7121 Synovial cyst of popliteal space [Baker], right knee: Secondary | ICD-10-CM | POA: Diagnosis not present

## 2021-08-18 DIAGNOSIS — M25561 Pain in right knee: Secondary | ICD-10-CM | POA: Diagnosis not present

## 2021-08-20 ENCOUNTER — Other Ambulatory Visit: Payer: Self-pay | Admitting: Family Medicine

## 2021-08-21 ENCOUNTER — Other Ambulatory Visit: Payer: Self-pay

## 2021-08-21 DIAGNOSIS — I10 Essential (primary) hypertension: Secondary | ICD-10-CM | POA: Diagnosis present

## 2021-08-21 DIAGNOSIS — Z801 Family history of malignant neoplasm of trachea, bronchus and lung: Secondary | ICD-10-CM

## 2021-08-21 DIAGNOSIS — Z818 Family history of other mental and behavioral disorders: Secondary | ICD-10-CM

## 2021-08-21 DIAGNOSIS — I341 Nonrheumatic mitral (valve) prolapse: Secondary | ICD-10-CM | POA: Diagnosis present

## 2021-08-21 DIAGNOSIS — Z87891 Personal history of nicotine dependence: Secondary | ICD-10-CM

## 2021-08-21 DIAGNOSIS — E876 Hypokalemia: Secondary | ICD-10-CM | POA: Diagnosis not present

## 2021-08-21 DIAGNOSIS — E739 Lactose intolerance, unspecified: Secondary | ICD-10-CM | POA: Diagnosis present

## 2021-08-21 DIAGNOSIS — I480 Paroxysmal atrial fibrillation: Principal | ICD-10-CM | POA: Diagnosis present

## 2021-08-21 DIAGNOSIS — F419 Anxiety disorder, unspecified: Secondary | ICD-10-CM | POA: Diagnosis present

## 2021-08-21 DIAGNOSIS — Z8249 Family history of ischemic heart disease and other diseases of the circulatory system: Secondary | ICD-10-CM

## 2021-08-21 DIAGNOSIS — Z79899 Other long term (current) drug therapy: Secondary | ICD-10-CM

## 2021-08-21 DIAGNOSIS — I248 Other forms of acute ischemic heart disease: Secondary | ICD-10-CM | POA: Diagnosis not present

## 2021-08-21 DIAGNOSIS — Z808 Family history of malignant neoplasm of other organs or systems: Secondary | ICD-10-CM

## 2021-08-21 DIAGNOSIS — Z888 Allergy status to other drugs, medicaments and biological substances status: Secondary | ICD-10-CM

## 2021-08-21 DIAGNOSIS — I4891 Unspecified atrial fibrillation: Secondary | ICD-10-CM | POA: Diagnosis not present

## 2021-08-21 DIAGNOSIS — M8580 Other specified disorders of bone density and structure, unspecified site: Secondary | ICD-10-CM | POA: Diagnosis present

## 2021-08-21 DIAGNOSIS — Z7901 Long term (current) use of anticoagulants: Secondary | ICD-10-CM

## 2021-08-21 DIAGNOSIS — Z20822 Contact with and (suspected) exposure to covid-19: Secondary | ICD-10-CM | POA: Diagnosis present

## 2021-08-21 DIAGNOSIS — Z79890 Hormone replacement therapy (postmenopausal): Secondary | ICD-10-CM

## 2021-08-21 DIAGNOSIS — E039 Hypothyroidism, unspecified: Secondary | ICD-10-CM | POA: Diagnosis present

## 2021-08-21 NOTE — ED Triage Notes (Signed)
Pt presents to ER c/o going into a-fib appx 30 minutes pta.  Pt states she has hx of same and took 12.5 mg metoprolol and 25 mg of flecainide.  Pt denies chest pain at this time but states she is sob and feels light headed.  Pt is A&O x4 at this time in triage.

## 2021-08-22 ENCOUNTER — Inpatient Hospital Stay
Admission: EM | Admit: 2021-08-22 | Discharge: 2021-08-24 | DRG: 309 | Disposition: A | Discharge status: Home / Self Care | Payer: Medicare HMO | Attending: Family Medicine | Admitting: Family Medicine

## 2021-08-22 ENCOUNTER — Emergency Department: Payer: Medicare HMO

## 2021-08-22 ENCOUNTER — Inpatient Hospital Stay: Payer: Medicare HMO

## 2021-08-22 DIAGNOSIS — F419 Anxiety disorder, unspecified: Secondary | ICD-10-CM | POA: Diagnosis present

## 2021-08-22 DIAGNOSIS — I10 Essential (primary) hypertension: Secondary | ICD-10-CM

## 2021-08-22 DIAGNOSIS — Z79890 Hormone replacement therapy (postmenopausal): Secondary | ICD-10-CM | POA: Diagnosis not present

## 2021-08-22 DIAGNOSIS — I341 Nonrheumatic mitral (valve) prolapse: Secondary | ICD-10-CM | POA: Diagnosis not present

## 2021-08-22 DIAGNOSIS — Z7901 Long term (current) use of anticoagulants: Secondary | ICD-10-CM | POA: Diagnosis not present

## 2021-08-22 DIAGNOSIS — R69 Illness, unspecified: Secondary | ICD-10-CM | POA: Diagnosis not present

## 2021-08-22 DIAGNOSIS — R000 Tachycardia, unspecified: Secondary | ICD-10-CM | POA: Diagnosis not present

## 2021-08-22 DIAGNOSIS — Z87891 Personal history of nicotine dependence: Secondary | ICD-10-CM | POA: Diagnosis not present

## 2021-08-22 DIAGNOSIS — Z79899 Other long term (current) drug therapy: Secondary | ICD-10-CM | POA: Diagnosis not present

## 2021-08-22 DIAGNOSIS — I4891 Unspecified atrial fibrillation: Secondary | ICD-10-CM | POA: Diagnosis not present

## 2021-08-22 DIAGNOSIS — Z888 Allergy status to other drugs, medicaments and biological substances status: Secondary | ICD-10-CM | POA: Diagnosis not present

## 2021-08-22 DIAGNOSIS — M8580 Other specified disorders of bone density and structure, unspecified site: Secondary | ICD-10-CM | POA: Diagnosis not present

## 2021-08-22 DIAGNOSIS — E039 Hypothyroidism, unspecified: Secondary | ICD-10-CM | POA: Diagnosis not present

## 2021-08-22 DIAGNOSIS — I480 Paroxysmal atrial fibrillation: Secondary | ICD-10-CM | POA: Diagnosis not present

## 2021-08-22 DIAGNOSIS — R0789 Other chest pain: Secondary | ICD-10-CM | POA: Diagnosis not present

## 2021-08-22 DIAGNOSIS — Z8249 Family history of ischemic heart disease and other diseases of the circulatory system: Secondary | ICD-10-CM | POA: Diagnosis not present

## 2021-08-22 DIAGNOSIS — E739 Lactose intolerance, unspecified: Secondary | ICD-10-CM | POA: Diagnosis not present

## 2021-08-22 DIAGNOSIS — Z20822 Contact with and (suspected) exposure to covid-19: Secondary | ICD-10-CM | POA: Diagnosis not present

## 2021-08-22 DIAGNOSIS — E876 Hypokalemia: Secondary | ICD-10-CM | POA: Diagnosis not present

## 2021-08-22 DIAGNOSIS — Z808 Family history of malignant neoplasm of other organs or systems: Secondary | ICD-10-CM | POA: Diagnosis not present

## 2021-08-22 DIAGNOSIS — Z801 Family history of malignant neoplasm of trachea, bronchus and lung: Secondary | ICD-10-CM | POA: Diagnosis not present

## 2021-08-22 DIAGNOSIS — I248 Other forms of acute ischemic heart disease: Secondary | ICD-10-CM | POA: Diagnosis not present

## 2021-08-22 DIAGNOSIS — Z818 Family history of other mental and behavioral disorders: Secondary | ICD-10-CM | POA: Diagnosis not present

## 2021-08-22 LAB — CBC WITH DIFFERENTIAL/PLATELET
Abs Immature Granulocytes: 0.07 10*3/uL (ref 0.00–0.07)
Basophils Absolute: 0 10*3/uL (ref 0.0–0.1)
Basophils Relative: 0 %
Eosinophils Absolute: 0 10*3/uL (ref 0.0–0.5)
Eosinophils Relative: 0 %
HCT: 39.8 % (ref 36.0–46.0)
Hemoglobin: 13 g/dL (ref 12.0–15.0)
Immature Granulocytes: 1 %
Lymphocytes Relative: 28 %
Lymphs Abs: 3.2 10*3/uL (ref 0.7–4.0)
MCH: 28.7 pg (ref 26.0–34.0)
MCHC: 32.7 g/dL (ref 30.0–36.0)
MCV: 87.9 fL (ref 80.0–100.0)
Monocytes Absolute: 0.8 10*3/uL (ref 0.1–1.0)
Monocytes Relative: 7 %
Neutro Abs: 7.4 10*3/uL (ref 1.7–7.7)
Neutrophils Relative %: 64 %
Platelets: 394 10*3/uL (ref 150–400)
RBC: 4.53 MIL/uL (ref 3.87–5.11)
RDW: 13.5 % (ref 11.5–15.5)
WBC: 11.6 10*3/uL — ABNORMAL HIGH (ref 4.0–10.5)
nRBC: 0 % (ref 0.0–0.2)

## 2021-08-22 LAB — COMPREHENSIVE METABOLIC PANEL
ALT: 24 U/L (ref 0–44)
AST: 31 U/L (ref 15–41)
Albumin: 4.2 g/dL (ref 3.5–5.0)
Alkaline Phosphatase: 64 U/L (ref 38–126)
Anion gap: 10 (ref 5–15)
BUN: 13 mg/dL (ref 8–23)
CO2: 21 mmol/L — ABNORMAL LOW (ref 22–32)
Calcium: 9.7 mg/dL (ref 8.9–10.3)
Chloride: 108 mmol/L (ref 98–111)
Creatinine, Ser: 0.79 mg/dL (ref 0.44–1.00)
GFR, Estimated: >60 mL/min (ref >60)
Glucose, Bld: 129 mg/dL — ABNORMAL HIGH (ref 70–99)
Potassium: 3.6 mmol/L (ref 3.5–5.1)
Sodium: 139 mmol/L (ref 135–145)
Total Bilirubin: 0.6 mg/dL (ref 0.3–1.2)
Total Protein: 7.9 g/dL (ref 6.5–8.1)

## 2021-08-22 LAB — URINALYSIS, COMPLETE (UACMP) WITH MICROSCOPIC
Bilirubin Urine: NEGATIVE
Glucose, UA: NEGATIVE mg/dL
Ketones, ur: NEGATIVE mg/dL
Leukocytes,Ua: NEGATIVE
Nitrite: NEGATIVE
Protein, ur: NEGATIVE mg/dL
Specific Gravity, Urine: 1.005 (ref 1.005–1.030)
Squamous Epithelial / HPF: NONE SEEN (ref 0–5)
pH: 9 — ABNORMAL HIGH (ref 5.0–8.0)

## 2021-08-22 LAB — MAGNESIUM
Magnesium: 2 mg/dL (ref 1.7–2.4)
Magnesium: 3.3 mg/dL — ABNORMAL HIGH (ref 1.7–2.4)

## 2021-08-22 LAB — TSH: TSH: 4.364 u[IU]/mL (ref 0.350–4.500)

## 2021-08-22 LAB — PROTIME-INR
INR: 1.8 — ABNORMAL HIGH (ref 0.8–1.2)
Prothrombin Time: 21 seconds — ABNORMAL HIGH (ref 11.4–15.2)

## 2021-08-22 LAB — BASIC METABOLIC PANEL
Anion gap: 9 (ref 5–15)
Anion gap: 7 (ref 5–15)
BUN: 12 mg/dL (ref 8–23)
BUN: 11 mg/dL (ref 8–23)
CO2: 21 mmol/L — ABNORMAL LOW (ref 22–32)
CO2: 20 mmol/L — ABNORMAL LOW (ref 22–32)
Calcium: 9.2 mg/dL (ref 8.9–10.3)
Calcium: 8.9 mg/dL (ref 8.9–10.3)
Chloride: 109 mmol/L (ref 98–111)
Chloride: 109 mmol/L (ref 98–111)
Creatinine, Ser: 0.77 mg/dL (ref 0.44–1.00)
Creatinine, Ser: 0.59 mg/dL (ref 0.44–1.00)
GFR, Estimated: >60 mL/min (ref >60)
GFR, Estimated: >60 mL/min (ref >60)
Glucose, Bld: 118 mg/dL — ABNORMAL HIGH (ref 70–99)
Glucose, Bld: 104 mg/dL — ABNORMAL HIGH (ref 70–99)
Potassium: 3.4 mmol/L — ABNORMAL LOW (ref 3.5–5.1)
Potassium: 3.2 mmol/L — ABNORMAL LOW (ref 3.5–5.1)
Sodium: 139 mmol/L (ref 135–145)
Sodium: 136 mmol/L (ref 135–145)

## 2021-08-22 LAB — HIV ANTIBODY (ROUTINE TESTING W REFLEX): HIV Screen 4th Generation wRfx: NONREACTIVE

## 2021-08-22 LAB — PHOSPHORUS
Phosphorus: 1.6 mg/dL — ABNORMAL LOW (ref 2.5–4.6)
Phosphorus: 4.4 mg/dL (ref 2.5–4.6)

## 2021-08-22 LAB — CBC
HCT: 37.1 % (ref 36.0–46.0)
Hemoglobin: 12.6 g/dL (ref 12.0–15.0)
MCH: 29.6 pg (ref 26.0–34.0)
MCHC: 34 g/dL (ref 30.0–36.0)
MCV: 87.1 fL (ref 80.0–100.0)
Platelets: 318 10*3/uL (ref 150–400)
RBC: 4.26 MIL/uL (ref 3.87–5.11)
RDW: 13.2 % (ref 11.5–15.5)
WBC: 11.1 10*3/uL — ABNORMAL HIGH (ref 4.0–10.5)
nRBC: 0 % (ref 0.0–0.2)

## 2021-08-22 LAB — PROCALCITONIN: Procalcitonin: <0.1 ng/mL

## 2021-08-22 LAB — TROPONIN I (HIGH SENSITIVITY)
Troponin I (High Sensitivity): 7 ng/L (ref <18)
Troponin I (High Sensitivity): 14 ng/L (ref <18)
Troponin I (High Sensitivity): 23 ng/L — ABNORMAL HIGH (ref <18)
Troponin I (High Sensitivity): 29 ng/L — ABNORMAL HIGH (ref <18)

## 2021-08-22 LAB — APTT: aPTT: 36 seconds (ref 24–36)

## 2021-08-22 LAB — MRSA NEXT GEN BY PCR, NASAL: MRSA by PCR Next Gen: NOT DETECTED

## 2021-08-22 LAB — LACTIC ACID, PLASMA
Lactic Acid, Venous: 3 mmol/L (ref 0.5–1.9)
Lactic Acid, Venous: 1.7 mmol/L (ref 0.5–1.9)

## 2021-08-22 LAB — T4, FREE: Free T4: 0.85 ng/dL (ref 0.61–1.12)

## 2021-08-22 LAB — BRAIN NATRIURETIC PEPTIDE: B Natriuretic Peptide: 238.8 pg/mL — ABNORMAL HIGH (ref 0.0–100.0)

## 2021-08-22 LAB — DIGOXIN LEVEL: Digoxin Level: <0.2 ng/mL — ABNORMAL LOW (ref 0.8–2.0)

## 2021-08-22 MED ORDER — POLYETHYLENE GLYCOL 3350 17 G PO PACK: 17.0000 g | PACK | Freq: Every day | ORAL | Status: DC | PRN

## 2021-08-22 MED ORDER — HYDROCODONE-ACETAMINOPHEN 5-325 MG PO TABS: 1.0000 | ORAL_TABLET | Freq: Four times a day (QID) | ORAL | Status: DC | PRN

## 2021-08-22 MED ORDER — ETOMIDATE 2 MG/ML IV SOLN
INTRAVENOUS | Status: AC
  Administered 2021-08-22: 8 mg
  Filled 2021-08-22: qty 10

## 2021-08-22 MED ORDER — MAGNESIUM SULFATE 2 GM/50ML IV SOLN
2.0000 g | Freq: Once | INTRAVENOUS | Status: AC
  Administered 2021-08-22: 2 g via INTRAVENOUS
  Filled 2021-08-22: qty 50

## 2021-08-22 MED ORDER — RIVAROXABAN 20 MG PO TABS
20.0000 mg | ORAL_TABLET | Freq: Every day | ORAL | Status: DC
  Administered 2021-08-22 – 2021-08-23 (×2): 20 mg via ORAL
  Filled 2021-08-22 (×3): qty 1

## 2021-08-22 MED ORDER — DIAZEPAM 5 MG/ML IJ SOLN
2.0000 mg | Freq: Once | INTRAMUSCULAR | Status: DC
  Filled 2021-08-22: qty 2

## 2021-08-22 MED ORDER — AMIODARONE HCL 200 MG PO TABS
400.0000 mg | ORAL_TABLET | Freq: Two times a day (BID) | ORAL | Status: DC
  Administered 2021-08-22: 400 mg via ORAL
  Filled 2021-08-22 (×2): qty 2

## 2021-08-22 MED ORDER — DIAZEPAM 5 MG/ML IJ SOLN
INTRAMUSCULAR | Status: AC | PRN
  Administered 2021-08-22: 2 mg via INTRAVENOUS

## 2021-08-22 MED ORDER — LEVOTHYROXINE SODIUM 50 MCG PO TABS
50.0000 ug | ORAL_TABLET | Freq: Every day | ORAL | Status: DC
Start: 2021-08-22 — End: 2021-08-22

## 2021-08-22 MED ORDER — AMIODARONE HCL IN DEXTROSE 360-4.14 MG/200ML-% IV SOLN: 30.0000 mg/h | INTRAVENOUS | Status: DC

## 2021-08-22 MED ORDER — IOHEXOL 350 MG/ML SOLN
75.0000 mL | Freq: Once | INTRAVENOUS | Status: AC | PRN
Start: 2021-08-22 — End: 2021-08-22
  Administered 2021-08-22: 75 mL via INTRAVENOUS

## 2021-08-22 MED ORDER — AMIODARONE HCL IN DEXTROSE 360-4.14 MG/200ML-% IV SOLN
60.0000 mg/h | INTRAVENOUS | Status: DC
  Filled 2021-08-22: qty 200

## 2021-08-22 MED ORDER — AMIODARONE HCL IN DEXTROSE 360-4.14 MG/200ML-% IV SOLN
30.0000 mg/h | INTRAVENOUS | Status: DC
  Administered 2021-08-22: 30 mg/h via INTRAVENOUS

## 2021-08-22 MED ORDER — LEVOTHYROXINE SODIUM 50 MCG PO TABS
50.0000 ug | ORAL_TABLET | Freq: Every day | ORAL | Status: DC
  Administered 2021-08-22 – 2021-08-24 (×3): 50 ug via ORAL
  Filled 2021-08-22 (×3): qty 1

## 2021-08-22 MED ORDER — LORAZEPAM 0.5 MG PO TABS
0.5000 mg | ORAL_TABLET | Freq: Four times a day (QID) | ORAL | Status: DC | PRN
  Administered 2021-08-22 – 2021-08-24 (×3): 0.5 mg via ORAL
  Filled 2021-08-22 (×3): qty 1

## 2021-08-22 MED ORDER — DOCUSATE SODIUM 100 MG PO CAPS: 100.0000 mg | ORAL_CAPSULE | Freq: Two times a day (BID) | ORAL | Status: DC | PRN

## 2021-08-22 MED ORDER — DILTIAZEM HCL-DEXTROSE 125-5 MG/125ML-% IV SOLN (PREMIX)
5.0000 mg/h | INTRAVENOUS | Status: DC
  Administered 2021-08-22: 5 mg/h via INTRAVENOUS
  Filled 2021-08-22: qty 125

## 2021-08-22 MED ORDER — POTASSIUM CHLORIDE CRYS ER 20 MEQ PO TBCR
20.0000 meq | EXTENDED_RELEASE_TABLET | Freq: Once | ORAL | Status: AC
  Administered 2021-08-22: 20 meq via ORAL
  Filled 2021-08-22: qty 1

## 2021-08-22 MED ORDER — PREDNISONE 1 MG PO TABS
2.0000 mg | ORAL_TABLET | Freq: Two times a day (BID) | ORAL | Status: DC
  Administered 2021-08-22 – 2021-08-24 (×5): 2 mg via ORAL
  Filled 2021-08-22 (×6): qty 2

## 2021-08-22 MED ORDER — ETOMIDATE 2 MG/ML IV SOLN
INTRAVENOUS | Status: AC | PRN
  Administered 2021-08-22: 4 mg via INTRAVENOUS

## 2021-08-22 MED ORDER — POTASSIUM PHOSPHATES 15 MMOLE/5ML IV SOLN
45.0000 mmol | Freq: Once | INTRAVENOUS | Status: AC
  Administered 2021-08-22: 45 mmol via INTRAVENOUS
  Filled 2021-08-22: qty 15

## 2021-08-22 MED ORDER — DILTIAZEM HCL 25 MG/5ML IV SOLN
10.0000 mg | Freq: Once | INTRAVENOUS | Status: AC
Start: 2021-08-22 — End: 2021-08-22
  Administered 2021-08-22: 10 mg via INTRAVENOUS
  Filled 2021-08-22: qty 5

## 2021-08-22 MED ORDER — AMIODARONE LOAD VIA INFUSION
150.0000 mg | Freq: Once | INTRAVENOUS | Status: AC
  Administered 2021-08-22: 150 mg via INTRAVENOUS
  Filled 2021-08-22: qty 83.34

## 2021-08-22 MED ORDER — CHLORHEXIDINE GLUCONATE CLOTH 2 % EX PADS
6.0000 | MEDICATED_PAD | Freq: Every day | CUTANEOUS | Status: DC
  Administered 2021-08-22: 6 via TOPICAL

## 2021-08-22 MED ORDER — AMIODARONE HCL IN DEXTROSE 360-4.14 MG/200ML-% IV SOLN
60.0000 mg/h | INTRAVENOUS | Status: DC
  Administered 2021-08-22 (×2): 60 mg/h via INTRAVENOUS
  Filled 2021-08-22: qty 200

## 2021-08-22 NOTE — ED Notes (Signed)
Pts HR range noted to be 49-63 beats per minute on monitor. Per Stark Klein, MD amiodarone infusion stopped at this time. EKG captured - pt appears to be in sinus rhythm at this time. Pt reports feeling like she is no longer in abnormal "afib" rhythm.

## 2021-08-22 NOTE — Sedation Documentation (Signed)
Synchronized cardioversion at 120J at this time.

## 2021-08-22 NOTE — ED Notes (Signed)
Attempted valsalva maneuvers - unsuccessful

## 2021-08-22 NOTE — Sedation Documentation (Signed)
Synchronized cardioversion at 200J.

## 2021-08-22 NOTE — ED Provider Notes (Signed)
[facility] Provider Note    Event Date/Time   First MD Initiated Contact with Patient 08/22/21 0002     (approximate)   History   Tachycardia   HPI  Denise Grant is a 66 y.o. female presents to the ED with a chief complaint of chest pain.  Patient with history of atrial fibrillation on Xarelto, on metoprolol.  Felt palpitations approximately 30 minutes ago.  Took 12.5 mg metoprolol and 25 mg of flecainide which she has been instructed to take in these situations.  Denies recent illness: Denies fever, cough, abdominal pain, nausea, vomiting, dysuria or diarrhea.     Past Medical History   Past Medical History:  Diagnosis Date  . Actinic keratosis   . Allergic rhinitis   . Anxiety   . Chiari malformation   . DDD (degenerative disc disease)   . Family history of other specified malignant neoplasm   . Headache(784.0)   . Insomnia, unspecified   . Lactose intolerance   . Mitral valve disorders(424.0)   . Osteopenia   . Seborrheic dermatitis, unspecified   . Vaginal cyst    stable     Active Problem List   Patient Active Problem List   Diagnosis Date Noted  . Atrial fibrillation with RVR ([place]) 08/22/2021  . Abnormal x-ray of knee 08/17/2021  . Effusion of right knee 08/17/2021  . Osteopenia of right lower leg 08/17/2021  . Acute pain of right knee 08/16/2021  . Pain and swelling of right lower leg 08/16/2021  . Posterior knee pain, right 08/16/2021  . Rheumatoid factor positive 08/28/2020  . Myalgia 08/25/2020  . Secondary hypercoagulable state ([place]) 07/05/2019  . Lumbar disc disease   . Bilateral bunions   . Paroxysmal atrial fibrillation ([place]) 09/27/2018  . Fatigue 09/27/2018  . Essential hypertension 01/01/2015  . Hypothyroidism 06/25/2014  . Migraine with vertigo 05/21/2014  . History of Chiari malformation 05/21/2014  . Atrophic vaginitis 05/01/2013  . INSOMNIA 01/27/2010  . HEADACHE 03/31/2008  . Osteopenia 09/18/2007  .  Generalized anxiety disorder 05/05/2006  . MITRAL VALVE PROLAPSE 05/05/2006  . ALLERGIC RHINITIS 05/05/2006  . FIBROCYSTIC BREAST DISEASE 05/05/2006  . SEBORRHEIC DERMATITIS 05/05/2006  . KERATOSIS, ACTINIC 05/05/2006  . NECK PAIN, CHRONIC 05/05/2006     Past Surgical History   Past Surgical History:  Procedure Laterality Date  . AIR/FLUID EXCHANGE Right 07/25/2018   Procedure: Air/Fluid Exchange;  Surgeon: Jalene Mullet, MD;  Location: [place];  Service: Ophthalmology;  Laterality: Right;  . bone spur removal  10/06   shoulder  . CATARACT EXTRACTION, BILATERAL Bilateral 2016  . [place]GERY  2005  . Chiari Malformation surgery  6/03  . EXCISION MORTON'S NEUROMA  2/04  . GAS INSERTION Right 07/25/2018   Procedure: Insertion Of Gas SF6;  Surgeon: Jalene Mullet, MD;  Location: [place];  Service: Ophthalmology;  Laterality: Right;  . LUMBAR DISC SURGERY    . 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 TUBAL LIGATION       Home Medications   Prior to Admission medications   Medication Sig Start Date End Date Taking? Authorizing Provider  amLODipine (NORVASC) 5 MG tablet Take 1 tablet (5 mg total) by mouth daily. 08/31/20  Yes Tower, Wynelle Fanny, MD  DENTA 5000 PLUS  1.1 % CREA dental cream Take 1 application by mouth at bedtime.  08/31/19  Yes [provider]  levothyroxine (SYNTHROID) 50 MCG tablet TAKE 1 TABLET BY MOUTH EVERY DAY BEFORE BREAKFAST 08/20/21  Yes Tower, Wynelle Fanny, MD  LORazepam (ATIVAN) 1 MG tablet TAKE A HALF TABLET BY MOUTH AT BEDTIME AS NEEDED 03/29/21  Yes Tower, Marne A, MD  metoprolol succinate (TOPROL-XL) 25 MG 24 hr tablet TAKE 1/2 TABLET BY MOUTH EVERY DAY 05/24/21  Yes Fenton, Clint R, PA  mometasone (ELOCON) 0.1 % ointment  Apply topically daily. Apply to itchy areas on the back bid prn 03/05/20  Yes Ralene Bathe, MD  predniSONE 2 MG TBEC Take 2 mg by mouth 2 (two) times daily with a meal. 05/03/21  Yes [provider]  XARELTO 20 MG TABS tablet TAKE 1 TABLET BY MOUTH DAILY WITH SUPPER. 02/17/21  Yes Fenton, Clint R, PA  flecainide (TAMBOCOR) 50 MG tablet Take 25 mg by mouth 2 (two) times daily. Patient not taking: Reported on 08/22/2021 05/11/21   [provider]  Tavaborole [place] Mon) 5 % SOLN Apply to toenails at bedtme Patient not taking: Reported on 08/22/2021 03/05/20   Ralene Bathe, MD     Allergies  Fentanyl, Buspar [buspirone], Calcium, Fluticasone propionate, Hctz [hydrochlorothiazide], Paroxetine, Zoloft [sertraline], and Hydroxychloroquine   Family History   Family History  Problem Relation Age of Onset  . Melanoma Sister   . Lung cancer Father   . Coronary artery disease Mother   . Heart failure Mother   . Hypertension Mother   . Anxiety disorder Son   . Lung cancer Brother        + smoker  . Breast cancer Neg Hx      Physical Exam  Triage Vital Signs: ED Triage Vitals  Enc Vitals Group     BP 08/21/21 2356 118/88     Pulse Rate 08/21/21 2356 (!) 179     Resp 08/21/21 2356 (!) 26     Temp 08/21/21 2356 98.4 F (36.9 C)     Temp Source 08/21/21 2356 Oral     SpO2 08/21/21 2356 100 %     Weight 08/21/21 2357 152 lb 1.9 oz (69 kg)     Height 08/21/21 2357 '5\' 6"'$  (1.676 m)     Head Circumference --      Peak Flow --      Pain Score 08/21/21 2357 0     Pain Loc --      Pain Edu? --      Excl. in [place]? --     Updated Vital Signs: BP 118/68   Pulse (!) 52   Temp 98 F (36.7 C) (Oral)   Resp 16   Ht '5\' 6"'$  (1.676 m)   Wt 69 kg   SpO2 100%   BMI 24.55 kg/m    General: Awake, mild to moderate distress.  CV:  Tachycardic, irregular rhythm.  Good peripheral perfusion.  Resp:  Increased effort.  CTA B. Abd:  Nontender no distention.  Other:  Bilateral  calves are nontender and nonswollen   ED Results / Procedures / Treatments  Labs (all labs ordered are listed, but only abnormal results are displayed) Labs Reviewed  CBC WITH DIFFERENTIAL/PLATELET - Abnormal; Notable for the following components:      Result Value   WBC 11.6 (*)    All other components within normal limits  COMPREHENSIVE METABOLIC PANEL - Abnormal; Notable for the following components:  CO2 21 (*)    Glucose, Bld 129 (*)    All other components within normal limits  BRAIN NATRIURETIC PEPTIDE - Abnormal; Notable for the following components:   B Natriuretic Peptide 238.8 (*)    All other components within normal limits  BASIC METABOLIC PANEL - Abnormal; Notable for the following components:   Potassium 3.4 (*)    CO2 21 (*)    Glucose, Bld 118 (*)    All other components within normal limits  PHOSPHORUS - Abnormal; Notable for the following components:   Phosphorus 1.6 (*)    All other components within normal limits  DIGOXIN LEVEL - Abnormal; Notable for the following components:   Digoxin Level <0.2 (*)    All other components within normal limits  PROTIME-INR - Abnormal; Notable for the following components:   Prothrombin Time 21.0 (*)    INR 1.8 (*)    All other components within normal limits  LACTIC ACID, PLASMA - Abnormal; Notable for the following components:   Lactic Acid, Venous 3.0 (*)    All other components within normal limits  URINALYSIS, COMPLETE (UACMP) WITH MICROSCOPIC - Abnormal; Notable for the following components:   Color, Urine COLORLESS (*)    APPearance CLEAR (*)    pH 9.0 (*)    Hgb urine dipstick SMALL (*)    Bacteria, UA RARE (*)    All other components within normal limits  CBC - Abnormal; Notable for the following components:   WBC 11.1 (*)    All other components within normal limits  BASIC METABOLIC PANEL - Abnormal; Notable for the following components:   Potassium 3.2 (*)    CO2 20 (*)    Glucose, Bld 104 (*)    All  other components within normal limits  MAGNESIUM - Abnormal; Notable for the following components:   Magnesium 3.3 (*)    All other components within normal limits  TROPONIN I (HIGH SENSITIVITY) - Abnormal; Notable for the following components:   Troponin I (High Sensitivity) 23 (*)    All other components within normal limits  CULTURE, BLOOD (ROUTINE X 2)  CULTURE, BLOOD (ROUTINE X 2)  TSH  T4, FREE  MAGNESIUM  APTT  LACTIC ACID, PLASMA  PROCALCITONIN  HIV ANTIBODY (ROUTINE TESTING W REFLEX)  TROPONIN I (HIGH SENSITIVITY)  TROPONIN I (HIGH SENSITIVITY)  TROPONIN I (HIGH SENSITIVITY)     EKG  ED ECG REPORT I, Alysabeth Scalia J, the attending physician, personally viewed and interpreted this ECG.   Date: 08/22/2021  EKG Time: 2356  Rate: 174  Rhythm: atrial fibrillation, rate 174  Axis: LAD  Intervals:none  ST&T Change: Inferior lateral ST depression    RADIOLOGY Independently visualized and interpreted patient's chest x-ray has been submitted condition:  Chest x-ray: Slight vascular congestion  Official radiology report(s): CT Angio Chest Pulmonary Embolism (PE) W or WO Contrast  Result Date: 08/22/2021 CLINICAL DATA:  Chest wall pain.  Atrial fibrillation. EXAM: CT ANGIOGRAPHY CHEST WITH CONTRAST TECHNIQUE: Multidetector CT imaging of the chest was performed using the standard protocol during bolus administration of intravenous contrast. Multiplanar CT image reconstructions and MIPs were obtained to evaluate the vascular anatomy. RADIATION DOSE REDUCTION: This exam was performed according to the departmental dose-optimization program which includes automated exposure control, adjustment of the mA and/or kV according to patient size and/or use of iterative reconstruction technique. CONTRAST:  39m OMNIPAQUE IOHEXOL 350 MG/ML SOLN COMPARISON:  None Available. FINDINGS: Cardiovascular: Satisfactory opacification of the pulmonary arteries to the segmental  level. No evidence of  pulmonary embolism. Aortic atherosclerosis. Normal heart size. No pericardial effusion. Mediastinum/Nodes: No enlarged mediastinal, hilar, or axillary lymph nodes. Thyroid gland, trachea, and esophagus demonstrate no significant findings. Lungs/Pleura: No pleural effusion. No airspace consolidation, atelectasis or pneumothorax. There is a mosaic attenuation pattern identified bilaterally with scattered areas of air trapping, likely representing small airways disease. Upper Abdomen: No acute abnormality. Musculoskeletal: No chest wall abnormality. No acute or significant osseous findings. Review of the MIP images confirms the above findings. IMPRESSION: 1. No evidence for acute pulmonary embolism. 2. Mosaic attenuation pattern identified bilaterally with scattered areas of air trapping, likely representing small airways disease. 3. Aortic Atherosclerosis (ICD10-I70.0). Electronically Signed   By: Kerby Moors M.D.   On: 08/22/2021 04:53   DG Chest Portable 1 View  Result Date: 08/22/2021 CLINICAL DATA:  Tachycardia.  Atrial fibrillation. EXAM: PORTABLE CHEST 1 VIEW COMPARISON:  05/04/2021 FINDINGS: Stable heart size. The cardiomediastinal contours are normal. Slight vascular congestion. No consolidation, pleural effusion, or pneumothorax. No acute osseous abnormalities are seen. IMPRESSION: Slight vascular congestion. Electronically Signed   By: Keith Rake M.D.   On: 08/22/2021 00:41     PROCEDURES:  Critical Care performed: Yes, see critical care procedure note(s)  CRITICAL CARE Performed by: Paulette Blanch   Total critical care time: 60 minutes  Critical care time was exclusive of separately billable procedures and treating other patients.  Critical care was necessary to treat or prevent imminent or life-threatening deterioration.  Critical care was time spent personally by me on the following activities: development of treatment plan with patient and/or surrogate as well as nursing,  discussions with consultants, evaluation of patient's response to treatment, examination of patient, obtaining history from patient or surrogate, ordering and performing treatments and interventions, ordering and review of laboratory studies, ordering and review of radiographic studies, pulse oximetry and re-evaluation of patient's condition.   Marland Kitchen1-3 Lead EKG Interpretation  Performed by: Paulette Blanch, MD Authorized by: Paulette Blanch, MD     Interpretation: abnormal     ECG rate:  174   ECG rate assessment: tachycardic     Rhythm: atrial fibrillation     Ectopy: none     Conduction: normal   Comments:     Patient placed on cardiac monitor to evaluate for arrhythmias .Cardioversion  Date/Time: 08/22/2021 1:50 AM  Performed by: Paulette Blanch, MD Authorized by: Paulette Blanch, MD   Consent:    Consent obtained:  Verbal   Consent given by:  Patient   Risks discussed:  Cutaneous burn, death, induced arrhythmia and pain   Alternatives discussed:  Rate-control medication Pre-procedure details:    Cardioversion basis:  Elective   Rhythm:  Atrial fibrillation   Electrode placement:  Anterior-posterior Patient sedated: Yes. Refer to sedation procedure documentation for details of sedation.  Attempt one:    Cardioversion mode:  Synchronous   Waveform:  Biphasic   Shock (joules) attempt one: 120.   Shock outcome:  No change in rhythm Attempt two:    Cardioversion mode:  Synchronous   Waveform:  Biphasic   Shock (Joules):  200   Shock outcome:  No change in rhythm Attempt three:    Cardioversion mode:  Synchronous   Waveform:  Biphasic   Shock (Joules):  200   Shock outcome:  No change in rhythm Post-procedure details:    Patient status:  Awake   Patient tolerance of procedure:  Tolerated well, no immediate complications .Sedation  Date/Time: 08/22/2021  2:00 AM  Performed by: Paulette Blanch, MD Authorized by: Paulette Blanch, MD   Consent:    Consent obtained:  Verbal   Consent given  by:  Patient   Risks discussed:  Allergic reaction, prolonged hypoxia resulting in organ damage, prolonged sedation necessitating reversal, dysrhythmia, inadequate sedation, respiratory compromise necessitating ventilatory assistance and intubation, nausea and vomiting   Alternatives discussed:  Anxiolysis Universal protocol:    Procedure explained and questions answered to patient or proxy's satisfaction: yes     Relevant documents present and verified: yes     Test results available: yes     Imaging studies available: yes     Required blood products, implants, devices, and special equipment available: yes     Immediately prior to procedure, a time out was called: yes     Patient identity confirmed:  Arm band Indications:    Procedure performed:  Cardioversion   Procedure necessitating sedation performed by:  Physician performing sedation Pre-sedation assessment:    Time since last food or drink:  2100   ASA classification: class 2 - patient with mild systemic disease     Mouth opening:  3 or more finger widths   Thyromental distance:  4 finger widths   Mallampati score:  I - soft palate, uvula, fauces, pillars visible   Neck mobility: normal     Pre-sedation assessments completed and reviewed: airway patency, cardiovascular function, hydration status, mental status, nausea/vomiting, pain level, respiratory function and temperature     Pre-sedation assessment completed:  08/22/2021 1:44 AM Immediate pre-procedure details:    Reassessment: Patient reassessed immediately prior to procedure     Reviewed: vital signs, relevant labs/tests and NPO status     Verified: bag valve mask available, emergency equipment available, intubation equipment available, IV patency confirmed, oxygen available, reversal medications available and suction available   Procedure details (see MAR for exact dosages):    Preoxygenation:  Nasal cannula   Sedation:  Etomidate   Intended level of sedation: deep    Analgesia:  None   Intra-procedure monitoring:  Blood pressure monitoring, cardiac monitor, continuous pulse oximetry, continuous capnometry, frequent LOC assessments and frequent vital sign checks   Intra-procedure events: none     Total Provider sedation time (minutes):  16 Post-procedure details:    Post-sedation assessment completed:  08/22/2021 2:00 AM   Attendance: Constant attendance by certified staff until patient recovered     Recovery: Patient returned to pre-procedure baseline     Post-sedation assessments completed and reviewed: airway patency, cardiovascular function, hydration status, mental status, nausea/vomiting, pain level, respiratory function and temperature     Patient is stable for discharge or admission: yes     Procedure completion:  Tolerated well, no immediate complications    MEDICATIONS ORDERED IN ED: Medications  diltiazem (CARDIZEM) 125 mg in dextrose 5% 125 mL (1 mg/mL) infusion (0 mg/hr Intravenous Stopped 08/22/21 0140)  diazepam (VALIUM) injection 2 mg (2 mg Intravenous See Procedure Record 08/22/21 0158)  docusate sodium (COLACE) capsule 100 mg (has no administration in time range)  polyethylene glycol (MIRALAX / GLYCOLAX) packet 17 g (has no administration in time range)  rivaroxaban (XARELTO) tablet 20 mg (has no administration in time range)  amiodarone (NEXTERONE) 1.8 mg/mL load via infusion 150 mg (150 mg Intravenous Bolus from Bag 08/22/21 0227)    Followed by  amiodarone (NEXTERONE PREMIX) 360-4.14 MG/200ML-% (1.8 mg/mL) IV infusion (0 mg/hr Intravenous Stopped 08/22/21 0535)    Followed by  amiodarone (NEXTERONE PREMIX)  360-4.14 MG/200ML-% (1.8 mg/mL) IV infusion (has no administration in time range)  diltiazem (CARDIZEM) injection 10 mg (10 mg Intravenous Given 08/22/21 0012)  etomidate (AMIDATE) 2 MG/ML injection (8 mg  Given 08/22/21 0144)  etomidate (AMIDATE) injection (4 mg Intravenous Given 08/22/21 0150)  diazepam (VALIUM) injection (2 mg Intravenous  Given 08/22/21 0157)  magnesium sulfate IVPB 2 g 50 mL (0 g Intravenous Stopped 08/22/21 0535)  iohexol (OMNIPAQUE) 350 MG/ML injection 75 mL (75 mLs Intravenous Contrast Given 08/22/21 0415)     IMPRESSION / MDM / ASSESSMENT AND PLAN / ED COURSE  I reviewed the triage vital signs and the nursing notes.                             66 year old female presenting in atrial fibrillation with rapid ventricular rate. Differential diagnosis includes, but is not limited to, ACS, aortic dissection, pulmonary embolism, cardiac tamponade, pneumothorax, pneumonia, pericarditis, myocarditis, GI-related causes including esophagitis/gastritis, and musculoskeletal chest wall pain.   I have personally reviewed patient's records and see her cardiology office visit on 07/15/2018.  Patient's presentation is most consistent with acute presentation with potential threat to life or bodily function.  The patient is on the cardiac monitor to evaluate for evidence of arrhythmia and/or significant heart rate changes.  We will obtain cardiac panel, chest x-ray.  Patient brought straight back to treatment room from triage.  Will administer IV Cardizem bolus, consider drip.  Will reassess.  Clinical Course as of 08/22/21 0541  Sun Aug 22, 2021  0028 Heart rate 148 after IV diltiazem.  Hemodynamically stable.  Place on infusion. [JS]  0128 Rate 138 on max Cardizem infusion.  Patient states she has been cardioverted 3 times prior.  Last once in April.  I reviewed patient's records from [facility] and see that she was cardioverted under etomidate with 120 J synchronized cardioversion.  I did give her the option of being hospitalized with Cardizem drip but she states she is too uncomfortable and prefers cardioversion. [JS]  3810 Patient had synchronized cardioversion at 120J, 200J and 200J without effect.  Currently heart rate 125-130, atrial fibrillation.  I have paged out cardiology for further recommendations. [JS]  0205 Case with  Dr. Garen Lah who recommends placing patient on Amiodarone infusion.  We will contact ICU intensivist for evaluation and admission. [JS]    Clinical Course User Index [JS] Paulette Blanch, MD     FINAL CLINICAL IMPRESSION(S) / ED DIAGNOSES   Final diagnoses:  Atrial fibrillation with rapid ventricular response [facility])     Rx / DC Orders   ED Discharge Orders          Ordered    Amb referral to [facility]        08/22/21 0217             Note:  This document was prepared using Dragon voice recognition software and may include unintentional dictation errors.   Paulette Blanch, MD 08/22/21 [id number]

## 2021-08-22 NOTE — ED Notes (Signed)
Time out at this time

## 2021-08-22 NOTE — H&P (Addendum)
NAME:  Denise Grant, MRN:  [id number], DOB:  [birth year], LOS: 0 ADMISSION DATE:  08/22/2021, CONSULTATION DATE:  08/22/21 REFERRING MD:  Lurline Hare  CHIEF COMPLAINT:  SOB    HPI  66 y.o  female with significant PMH of CAD, HTN, hypothyroidism, Chiari malformation, DDD, disorder, atrial fibrillation on Xarelto and metoprolol who presented to the ED with chief complaints of SOB, heart palpitations and light headedness.  Patient states she took 12.5 mg metoprolol and 25 mg of flecainide with no improvement so she presented to the ED for further evaluation.  ED Course:on arrival to the ED, the  temperature was 36.9C, the heart rate 179 beats/minute, the blood pressure 118/88 mm Hg, the respiratory rate 30 breaths/minute, and the oxygen saturation 100% on RA.  Stat EKG was obtained and showed patient in atrial fibrillation with RVR,  ventricular rate 170s.  Valsalva maneuvers was attempted but was unsuccessful.  IV access was obtained and the patient was administered an IV diltiazem bolus of 10 mg followed by infusion with no improvement. Synchronized cardioversion was performed at 120 J without success followed by 200 J and additional 200 J without success.  On-call cardiologist was consulted who recommended chemical cardioversion with amiodarone.  Patient was started on amiodarone with improvement in heart rate.  Pertinent Labs/Diagnostics Findings: Chemistry:Na+/ K+:  139/3.6 Glucose: 129 CBC: WBC:11.6 Other Lab findings: PCT: pending  Lactic acid:pending   COVID PCR: pending , Troponin: 7  BNP: 238.8  Imaging:  CXR>  Slight vascular congestion. No consolidation, pleural effusion, or pneumothorax.  Due to ongoing symptoms and high risk for decompensation. PCCM consulted for admission and further management.  Past Medical History    Actinic keratosis   Allergic rhinitis   Anxiety   Chiari malformation   DDD (degenerative disc disease)   Hypothyroidism   Headache   Insomnia, unspecified    HTN   Mitral valve disorders(424.0)   Osteopenia   Seborrheic dermatitis, unspecified   Atrial Fibrillation on Xarelto       Significant Hospital Events   8/6: Admitted to the ICU with A-fib RVR s/p cardioversion  Consults:  Cardiology  Procedures:  8/6: Synchronized cardioversion  Significant Diagnostic Tests:  8/6: Chest Xray>Slight vascular congestion. No consolidation, pleural effusion, or pneumothorax. 8/6: CTA Chest>  Micro Data:  8/6: Blood culture x2> 8/6: Urine Culture> 8/6: MRSA PCR>>   Antimicrobials:  None  OBJECTIVE  Blood pressure (!) 142/74, pulse (!) 114, temperature 98.4 F (36.9 C), temperature source Oral, resp. rate 18, height '5\' 6"'$  (1.676 m), weight 69 kg, SpO2 100 %.        Intake/Output Summary (Last 24 hours) at 08/22/2021 0242 Last data filed at 08/22/2021 0158 Gross per 24 hour  Intake 13.14 ml  Output --  Net 13.14 ml   Filed Weights   08/21/21 2357  Weight: 69 kg     Physical Examination  GENERAL: 66 year-old  female critically ill patient lying in the bed with no acute distress.  EYES: Pupils equal, round, reactive to light and accommodation. No scleral icterus. Extraocular muscles intact.  HEENT: Head atraumatic, normocephalic. Oropharynx and nasopharynx clear.  NECK:  Supple, no jugular venous distention. No thyroid enlargement, no tenderness.  LUNGS: Normal breath sounds bilaterally, no wheezing, rales,rhonchi or crepitation. No use of accessory muscles of respiration.  CARDIOVASCULAR: S1, S2 normal. No murmurs, rubs, or gallops.  ABDOMEN: Soft, nontender, nondistended. Bowel sounds present. No organomegaly or mass.  EXTREMITIES: No pedal edema, cyanosis,  or clubbing.  NEUROLOGIC: Cranial nerves II through XII are intact.  Muscle strength 5/5 in all extremities. Sensation intact. Gait not checked.  PSYCHIATRIC: The patient is alert and oriented x 3.  SKIN: No obvious rash, lesion, or ulcer.   Labs/imaging that I havepersonally  reviewed  (right click and "Reselect all SmartList Selections" daily)     Labs   CBC: Recent Labs  Lab 08/22/21 0015  WBC 11.6*  NEUTROABS 7.4  HGB 13.0  HCT 39.8  MCV 87.9  PLT 295    Basic Metabolic Panel: Recent Labs  Lab 08/22/21 0015  NA 139  K 3.6  CL 108  CO2 21*  GLUCOSE 129*  BUN 13  CREATININE 0.79  CALCIUM 9.7   GFR: Estimated Creatinine Clearance: 63.9 mL/min (by C-G formula based on SCr of 0.79 mg/dL). Recent Labs  Lab 08/22/21 0015  WBC 11.6*    Liver Function Tests: Recent Labs  Lab 08/22/21 0015  AST 31  ALT 24  ALKPHOS 64  BILITOT 0.6  PROT 7.9  ALBUMIN 4.2   No results for input(s): "LIPASE", "AMYLASE" in the last 168 hours. No results for input(s): "AMMONIA" in the last 168 hours.  ABG No results found for: "PHART", "PCO2ART", "PO2ART", "HCO3", "TCO2", "ACIDBASEDEF", "O2SAT"   Coagulation Profile: No results for input(s): "INR", "PROTIME" in the last 168 hours.  Cardiac Enzymes: No results for input(s): "CKTOTAL", "CKMB", "CKMBINDEX", "TROPONINI" in the last 168 hours.  HbA1C: No results found for: "HGBA1C"  CBG: No results for input(s): "GLUCAP" in the last 168 hours.  Review of Systems:   Review of Systems  Constitutional:  Negative for chills, diaphoresis, fever, malaise/fatigue and weight loss.  HENT: Negative.    Eyes:  Positive for blurred vision.  Respiratory:  Negative for cough, hemoptysis, sputum production, shortness of breath and wheezing.   Cardiovascular:  Positive for chest pain and palpitations. Negative for orthopnea, claudication, leg swelling and PND.  Gastrointestinal:  Positive for nausea. Negative for abdominal pain, blood in stool, constipation, diarrhea and vomiting.  Genitourinary: Negative.   Musculoskeletal: Negative.   Skin:  Positive for itching and rash.  Neurological:  Positive for dizziness.  Endo/Heme/Allergies: Negative.   Psychiatric/Behavioral:  The patient is nervous/anxious.     Past Medical History  She,  has a past medical history of Actinic keratosis, Allergic rhinitis, Anxiety, Chiari malformation, DDD (degenerative disc disease), Family history of other specified malignant neoplasm, Headache(784.0), Insomnia, unspecified, Lactose intolerance, Mitral valve disorders(424.0), Osteopenia, Seborrheic dermatitis, unspecified, and Vaginal cyst.   Surgical History    Past Surgical History:  Procedure Laterality Date   AIR/FLUID EXCHANGE Right 07/25/2018   Procedure: Air/Fluid Exchange;  Surgeon: Jalene Mullet, MD;  Location: [place];  Service: Ophthalmology;  Laterality: Right;   bone spur removal  10/06   shoulder   CATARACT EXTRACTION, BILATERAL Bilateral 2016   CERVICAL [place] SURGERY  2005   Chiari Malformation surgery  6/03   EXCISION MORTON'S NEUROMA  2/04   GAS INSERTION Right 07/25/2018   Procedure: Insertion Of Gas SF6;  Surgeon: Jalene Mullet, MD;  Location: [place];  Service: Ophthalmology;  Laterality: Right;   LUMBAR DISC SURGERY     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 Her smoking use included cigarettes. She has been exposed to tobacco smoke. She has never used smokeless tobacco. She reports that she does not drink alcohol and does not use drugs.   Family History   Her family history includes Anxiety disorder in her son; Coronary artery disease in her mother; Heart failure in her mother; Hypertension in her mother; Lung cancer in her brother and father; Melanoma in her sister. There is no history of Breast cancer.   Allergies Allergies  Allergen Reactions   Fentanyl Other (See Comments)    Seizure    Buspar [Buspirone]      Felt funny   Calcium     REACTION: cannot tolerate any calcium supplement - GI sympotms   Fluticasone Propionate     REACTION: headache/irritation   Hctz [Hydrochlorothiazide] Other (See Comments)    Eye redness and discomfort/ slt blurred vision    Paroxetine     REACTION: abdominal pain and constipation   Zoloft [Sertraline]     Diarrhea    Hydroxychloroquine Itching and Rash     Home Medications  Prior to Admission medications   Medication Sig Start Date End Date Taking? Authorizing Provider  amLODipine (NORVASC) 5 MG tablet Take 1 tablet (5 mg total) by mouth daily. 08/31/20  Yes Tower, Wynelle Fanny, MD  levothyroxine (SYNTHROID) 50 MCG tablet TAKE 1 TABLET BY MOUTH EVERY DAY BEFORE BREAKFAST 08/20/21  Yes Tower, Wynelle Fanny, MD  LORazepam (ATIVAN) 1 MG tablet TAKE A HALF TABLET BY MOUTH AT BEDTIME AS NEEDED 03/29/21  Yes Tower, Marne A, MD  metoprolol succinate (TOPROL-XL) 25 MG 24 hr tablet TAKE 1/2 TABLET BY MOUTH EVERY DAY 05/24/21  Yes Fenton, Clint R, PA  mometasone (ELOCON) 0.1 % ointment Apply topically daily. Apply to itchy areas on the back bid prn 03/05/20  Yes Ralene Bathe, MD  DENTA 5000 PLUS 1.1 % CREA dental cream Take 1 application by mouth at bedtime.  08/31/19   [provider]  flecainide (TAMBOCOR) 50 MG tablet Take 25 mg by mouth 2 (two) times daily. Patient not taking: Reported on 08/22/2021 05/11/21   [provider]  predniSONE (DELTASONE) 5 MG tablet Take 7.5 mg by mouth. 05/03/21   [provider]  Tavaborole (KERYDIN) 5 % SOLN Apply to toenails at bedtme 03/05/20   Ralene Bathe, MD  XARELTO 20 MG TABS tablet TAKE 1 TABLET BY MOUTH DAILY WITH SUPPER. 02/17/21   Fenton, Clint R, PA  Scheduled Meds:  diazepam  2 mg Intravenous Once   rivaroxaban  20 mg Oral Q supper   Continuous Infusions:  amiodarone 60 mg/hr (08/22/21 0227)   Followed by   amiodarone     diltiazem (CARDIZEM) infusion Stopped (08/22/21 0140)   magnesium sulfate bolus IVPB  2 g (08/22/21 0342)   PRN Meds:.docusate sodium, iohexol, polyethylene glycol   Active Hospital Problem list     Assessment & Plan:   AFib+RVR, Unstable  Hx of Afib on Xarelto and Flecainide, Mitral Valve disorder S/pDC Cardioversion (100-200J biphasic) under sedation x 3 -s/p Cardizem 10 mg bolus x 1 followed by infusion with no improvement -Serial EKGs -Trend Troponins -TSH, FT4 wnl -TTEcho eval for structural abnormalities -CTA Chest to eval for lung pathology, low suspicion for PE given she is already anticoagulated on Xarelto -Blood Cx for underlying infectious process -Start Amiodarone 150 mg IV bolus, then '1mg'$ /min x6 hrs, then 0.'5mg'$ /min for 18 hours  -Keep NPO  for option of TEEcho + DCCardioversion -Cardiology EP Consultation for other regimens, ablation, pacer strategies, etc.  HTN  Goal BP <130/80 -Hold Amlodipine and metoprolol while on Amiodarone gtt as above  Hypothyroidism -TSH & Free T4 wnl -Continue Synthroid  Anxiety -Lorazepam PRN  Best practice:  Diet:  NPO Pain/Anxiety/Delirium protocol (if indicated): No VAP protocol (if indicated): Not indicated DVT prophylaxis: Systemic AC GI prophylaxis: N/A Glucose control:  SSI No Central venous access:  N/A Arterial line:  N/A Foley:  N/A Mobility:  bed rest  PT consulted: N/A Last date of multidisciplinary goals of care discussion [08/21/21] Code Status:  full code Disposition: ICU   = Goals of Care = Code Status Order: FULL  Primary Emergency Contact: Doniesha, Landau, Home Phone: [phone] Wishes to pursue full aggressive treatment and intervention options, including CPR and intubation, but goals of care will be addressed on going with family if that should become necessary.  Critical care time: 45 minutes       Rufina Falco, DNP, CCRN, FNP-C, AGACNP-BC Acute Care Nurse Practitioner [facility]  PCCM on call pager [phone] until 7 am

## 2021-08-22 NOTE — Consult Note (Signed)
Cardiology Consultation:   Patient ID: Denise Grant MRN: [id number]; DOB: [birth year]  Admit date: 08/22/2021 Date of Consult: 08/22/2021  PCP:  Abner Greenspan, MD   [facility] Cardiologist: Dr. Curt Bears   Patient Profile:   Denise Grant is a 66 y.o. female with a hx of paroxysmal atrial fibrillation who is being seen 08/22/2021 for the evaluation of A-fib RVR at the request of Dr. Beather Arbour.  History of Present Illness:   Denise Grant is a 66 year old female with history of paroxysmal atrial fibrillation presenting with symptoms of palpitations.  Patient was in bed last evening when she suddenly felt her heart beating fast.  Also complaining of shortness of breath, lightheadedness.  She knew she was in A-fib because she has had similar symptoms in the past.  She took p.o. flecainide without resolution of symptoms, told family who brought her to the emergency room.  Was previously on standing dose of flecainide, but this was stopped due to headaches.  She also takes Toprol-XL 12.5 mg daily and Xarelto for stroke prophylaxis.  In the ED upon admission, EKG showed A-fib with rapid ventricular response, heart rate up to 174.  Started on Cardizem drip with slight improvement in heart rates but patient still symptomatic.  DC cardioversion was attempted 3 times without success.  Patient started on amiodarone drip with bolus.  Spontaneously converted to sinus rhythm this morning.  Denies any symptoms upon my exam.  She follows up in the atrial fibrillation clinic, sees Dr. Curt Bears from an EP perspective.  A-fib ablation being planned in October .   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.0)    Osteopenia    Seborrheic  dermatitis, unspecified    Vaginal cyst    stable    Past Surgical History:  Procedure Laterality Date   AIR/FLUID EXCHANGE Right 07/25/2018   Procedure: Air/Fluid Exchange;  Surgeon: Jalene Mullet, MD;  Location: [place];  Service: Ophthalmology;  Laterality: Right;   bone spur removal  10/06   shoulder   CATARACT EXTRACTION, BILATERAL Bilateral 2016   CERVICAL [place] SURGERY  2005   Chiari Malformation surgery  6/03   EXCISION MORTON'S NEUROMA  2/04   GAS INSERTION Right 07/25/2018   Procedure: Insertion Of Gas SF6;  Surgeon: Jalene Mullet, MD;  Location: [place];  Service: Ophthalmology;  Laterality: Right;   LUMBAR DISC SURGERY     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 Authorizing Provider  amLODipine (NORVASC) 5 MG tablet Take 1 tablet (5 mg total) by mouth daily. 08/31/20  Yes Tower, Wynelle Fanny, MD  DENTA 5000 PLUS 1.1 % CREA dental cream Take 1 application by mouth at bedtime.  08/31/19  Yes [provider]  levothyroxine (SYNTHROID) 50 MCG tablet TAKE 1 TABLET BY MOUTH EVERY DAY BEFORE BREAKFAST 08/20/21  Yes Tower, [employer],  MD  LORazepam (ATIVAN) 1 MG tablet TAKE A HALF TABLET BY MOUTH AT BEDTIME AS NEEDED 03/29/21  Yes Tower, Marne A, MD  metoprolol succinate (TOPROL-XL) 25 MG 24 hr tablet TAKE 1/2 TABLET BY MOUTH EVERY DAY 05/24/21  Yes Fenton, Clint R, PA  mometasone (ELOCON) 0.1 % ointment Apply topically daily. Apply to itchy areas on the back bid prn 03/05/20  Yes Ralene Bathe, MD  predniSONE 2 MG TBEC Take 2 mg by mouth 2 (two) times daily with a meal. 05/03/21  Yes [provider]  XARELTO 20 MG TABS tablet TAKE 1  TABLET BY MOUTH DAILY WITH SUPPER. 02/17/21  Yes Fenton, Clint R, PA  flecainide (TAMBOCOR) 50 MG tablet Take 25 mg by mouth 2 (two) times daily. Patient not taking: Reported on 08/22/2021 05/11/21   [provider]  Tavaborole [place] Mon) 5 % SOLN Apply to toenails at bedtme Patient not taking: Reported on 08/22/2021 03/05/20   Ralene Bathe, MD    Inpatient Medications: Scheduled Meds:  amiodarone  400 mg Oral BID   Chlorhexidine Gluconate Cloth  6 each Topical Q0600   diazepam  2 mg Intravenous Once   levothyroxine  50 mcg Oral Q0600   predniSONE  2 mg Oral BID WC   rivaroxaban  20 mg Oral Q supper   Continuous Infusions:  potassium PHOSPHATE IVPB (in mmol) 45 mmol (08/22/21 0808)   PRN Meds: docusate sodium, HYDROcodone-acetaminophen, polyethylene glycol  Allergies:    Allergies  Allergen Reactions   Fentanyl Other (See Comments)    Seizure    Buspar [Buspirone]     Felt funny   Calcium     REACTION: cannot tolerate any calcium supplement - GI sympotms   Fluticasone Propionate     REACTION: headache/irritation   Hctz [Hydrochlorothiazide] Other (See Comments)    Eye redness and discomfort/ slt blurred vision    Paroxetine     REACTION: abdominal pain and constipation   Zoloft [Sertraline]     Diarrhea    Hydroxychloroquine Itching and Rash    Social History:   Social History   Socioeconomic History   Marital status: Widowed    Spouse name: Not on file   Number of children: Not on file   Years of education: Not on file   Highest education level: Not on file  Occupational History   Not on file  Tobacco Use   Smoking status: Former    Types: Cigarettes    Quit date: 01/17/2001    Years since quitting: 20.6    Passive exposure: Past   Smokeless tobacco: Never   Tobacco comments:    Former smoker 01/13/2021  Substance and Sexual Activity   Alcohol use: No    Alcohol/week: 0.0 standard drinks of alcohol   Drug use: No   Sexual activity: Not on file   Other Topics Concern   Not on file  Social History Narrative   Walks daily for exercise      Married--husband diagnosed with stomach cancer (5/11)   Lost her husband 4/12 to cancer    Social Determinants of Health   Financial Resource Strain: Not on file  Food Insecurity: Not on file  Transportation Needs: Not on file  Physical Activity: Not on file  Stress: Not on file  Social Connections: Not on file  Intimate Partner Violence: Not on file    Family History:    Family History  Problem Relation Age of Onset   Melanoma Sister  Lung cancer Father    Coronary artery disease Mother    Heart failure Mother    Hypertension Mother    Anxiety disorder Son    Lung cancer Brother        + smoker   Breast cancer Neg Hx      ROS:  Please see the history of present illness.   All other ROS reviewed and negative.     Physical Exam/Data:   Vitals:   08/22/21 0800 08/22/21 0900 08/22/21 1130 08/22/21 1200  BP: 126/62 107/88 121/64 131/71  Pulse: (!) 55 (!) 53 (!) 52 61  Resp: '17 16 12 14  '$ Temp:      TempSrc:      SpO2: 100% 100% 100% 100%  Weight:      Height:        Intake/Output Summary (Last 24 hours) at 08/22/2021 1351 Last data filed at 08/22/2021 9470 Gross per 24 hour  Intake 362.6 ml  Output 1950 ml  Net -1587.4 ml      08/21/2021   11:57 PM 08/16/2021   11:41 AM 07/14/2021    9:29 AM  Last 3 Weights  Weight (lbs) 152 lb 1.9 oz 152 lb 2 oz 152 lb 6.4 oz  Weight (kg) 69 kg 69.003 kg 69.128 kg     Body mass index is 24.55 kg/m.  General:  Well nourished, well developed, in no acute distress HEENT: normal Neck: no JVD Vascular: No carotid bruits; Distal pulses 2+ bilaterally Cardiac:  normal S1, S2; RRR; no murmur  Lungs:  clear to auscultation bilaterally, no wheezing, rhonchi or rales  Abd: soft, nontender, no hepatomegaly  Ext: no edema Musculoskeletal:  No deformities, BUE and BLE strength normal and equal Skin: warm and dry  Neuro:  CNs 2-12  intact, no focal abnormalities noted Psych:  Normal affect   EKG:  The EKG was personally reviewed and demonstrates: A-fib RVR Telemetry:  Telemetry was personally reviewed and demonstrates: Sinus bradycardia heart rate 55  Relevant CV Studies: Echo 2020 EF 60 to 65% Repeat echocardiogram ordered today pending  Laboratory Data:  High Sensitivity Troponin:   Recent Labs  Lab 08/22/21 0015 08/22/21 0206 08/22/21 0334 08/22/21 0442  TROPONINIHS 7 14 23* 29*     Chemistry Recent Labs  Lab 08/22/21 0015 08/22/21 0334 08/22/21 0442  NA 139 139 136  K 3.6 3.4* 3.2*  CL 108 109 109  CO2 21* 21* 20*  GLUCOSE 129* 118* 104*  BUN '13 12 11  '$ CREATININE 0.79 0.77 0.59  CALCIUM 9.7 9.2 8.9  MG  --  2.0 3.3*  GFRNONAA >60 >60 >60  ANIONGAP '10 9 7    '$ Recent Labs  Lab 08/22/21 0015  PROT 7.9  ALBUMIN 4.2  AST 31  ALT 24  ALKPHOS 64  BILITOT 0.6   Lipids No results for input(s): "CHOL", "TRIG", "HDL", "LABVLDL", "LDLCALC", "CHOLHDL" in the last 168 hours.  Hematology Recent Labs  Lab 08/22/21 0015 08/22/21 0442  WBC 11.6* 11.1*  RBC 4.53 4.26  HGB 13.0 12.6  HCT 39.8 37.1  MCV 87.9 87.1  MCH 28.7 29.6  MCHC 32.7 34.0  RDW 13.5 13.2  PLT 394 318   Thyroid  Recent Labs  Lab 08/22/21 0015  TSH 4.364  FREET4 0.85    BNP Recent Labs  Lab 08/22/21 0015  BNP 238.8*    DDimer No results for input(s): "DDIMER" in the last 168 hours.   Radiology/Studies:  CT Angio Chest Pulmonary Embolism (PE)  W or WO Contrast  Result Date: 08/22/2021 CLINICAL DATA:  Chest wall pain.  Atrial fibrillation. EXAM: CT ANGIOGRAPHY CHEST WITH CONTRAST TECHNIQUE: Multidetector CT imaging of the chest was performed using the standard protocol during bolus administration of intravenous contrast. Multiplanar CT image reconstructions and MIPs were obtained to evaluate the vascular anatomy. RADIATION DOSE REDUCTION: This exam was performed according to the departmental dose-optimization  program which includes automated exposure control, adjustment of the mA and/or kV according to patient size and/or use of iterative reconstruction technique. CONTRAST:  70m OMNIPAQUE IOHEXOL 350 MG/ML SOLN COMPARISON:  None Available. FINDINGS: Cardiovascular: Satisfactory opacification of the pulmonary arteries to the segmental level. No evidence of pulmonary embolism. Aortic atherosclerosis. Normal heart size. No pericardial effusion. Mediastinum/Nodes: No enlarged mediastinal, hilar, or axillary lymph nodes. Thyroid gland, trachea, and esophagus demonstrate no significant findings. Lungs/Pleura: No pleural effusion. No airspace consolidation, atelectasis or pneumothorax. There is a mosaic attenuation pattern identified bilaterally with scattered areas of air trapping, likely representing small airways disease. Upper Abdomen: No acute abnormality. Musculoskeletal: No chest wall abnormality. No acute or significant osseous findings. Review of the MIP images confirms the above findings. IMPRESSION: 1. No evidence for acute pulmonary embolism. 2. Mosaic attenuation pattern identified bilaterally with scattered areas of air trapping, likely representing small airways disease. 3. Aortic Atherosclerosis (ICD10-I70.0). Electronically Signed   By: TKerby MoorsM.D.   On: 08/22/2021 04:53   DG Chest Portable 1 View  Result Date: 08/22/2021 CLINICAL DATA:  Tachycardia.  Atrial fibrillation. EXAM: PORTABLE CHEST 1 VIEW COMPARISON:  05/04/2021 FINDINGS: Stable heart size. The cardiomediastinal contours are normal. Slight vascular congestion. No consolidation, pleural effusion, or pneumothorax. No acute osseous abnormalities are seen. IMPRESSION: Slight vascular congestion. Electronically Signed   By: MKeith RakeM.D.   On: 08/22/2021 00:41     Assessment and Plan:   Paroxysmal A-fib with RVR -Patient very symptomatic when in A-fib -Converted to sinus rhythm on IV amiodarone -Start p.o. amiodarone 400 mg  twice daily x1 week, decrease to 200 twice daily x1 week after. -Continue Xarelto -EKG/telemetry showing sinus bradycardia heart rate 55.  Hold Toprol-XL -If stays symptom-free over the next 24 hours, anticipate discharge tomorrow on oral amiodarone for EP follow-up as outpatient. -Has ablation procedure already planned as outpatient. -Did not tolerate flecainide in the past  2. Hypertension -Restart PTA Norvasc.   Total encounter time more than 85 minutes  Greater than 50% was spent in counseling and coordination of care with the patient    Signed, BKate Sable MD  08/22/2021 1:51 PM

## 2021-08-22 NOTE — Consult Note (Signed)
PHARMACY CONSULT NOTE - FOLLOW UP  Pharmacy Consult for Electrolyte Monitoring and Replacement   Recent Labs: Potassium (mmol/L)  Date Value  08/22/2021 3.2 (L)   Magnesium (mg/dL)  Date Value  08/22/2021 3.3 (H)   Calcium (mg/dL)  Date Value  08/22/2021 8.9   Albumin (g/dL)  Date Value  08/22/2021 4.2   Phosphorus (mg/dL)  Date Value  08/22/2021 1.6 (L)   Sodium (mmol/L)  Date Value  08/22/2021 136     Assessment: 66 y.o  female with significant PMH of CAD, HTN, hypothyroidism, Chiari malformation, DDD, disorder, atrial fibrillation on Xarelto and metoprolol who presented to the ED with chief complaints of SOB, heart palpitations and light headedness.    On amiodarone gtt, on dilt gtt,   Goal of Therapy:  K+ > 4 Mg > 2   Plan:  Pt was ordered Kphos IV 45 mmol x 1 IV. Pt will receive 66 mEq of potassium from this bag. Will order Kcl 20 mEq x 1.   Oswald Hillock ,PharmD Clinical Pharmacist 08/22/2021 8:47 AM

## 2021-08-23 ENCOUNTER — Inpatient Hospital Stay (HOSPITAL_COMMUNITY)
Admit: 2021-08-23 | Discharge: 2021-08-23 | Disposition: A | Discharge status: Home / Self Care | Payer: Medicare HMO | Attending: Nurse Practitioner | Admitting: Nurse Practitioner

## 2021-08-23 DIAGNOSIS — I4891 Unspecified atrial fibrillation: Secondary | ICD-10-CM

## 2021-08-23 LAB — BASIC METABOLIC PANEL
Anion gap: 4 — ABNORMAL LOW (ref 5–15)
Anion gap: 6 (ref 5–15)
BUN: 13 mg/dL (ref 8–23)
BUN: 15 mg/dL (ref 8–23)
CO2: 22 mmol/L (ref 22–32)
CO2: 24 mmol/L (ref 22–32)
Calcium: 9.1 mg/dL (ref 8.9–10.3)
Calcium: 9 mg/dL (ref 8.9–10.3)
Chloride: 109 mmol/L (ref 98–111)
Chloride: 107 mmol/L (ref 98–111)
Creatinine, Ser: 0.72 mg/dL (ref 0.44–1.00)
Creatinine, Ser: 0.73 mg/dL (ref 0.44–1.00)
GFR, Estimated: >60 mL/min (ref >60)
GFR, Estimated: >60 mL/min (ref >60)
Glucose, Bld: 106 mg/dL — ABNORMAL HIGH (ref 70–99)
Glucose, Bld: 83 mg/dL (ref 70–99)
Potassium: 4.3 mmol/L (ref 3.5–5.1)
Potassium: 4.2 mmol/L (ref 3.5–5.1)
Sodium: 135 mmol/L (ref 135–145)
Sodium: 137 mmol/L (ref 135–145)

## 2021-08-23 LAB — ECHOCARDIOGRAM COMPLETE
AR max vel: 2.81 cm2
AV Area VTI: 3.11 cm2
AV Area mean vel: 2.78 cm2
AV Mean grad: 5 mmHg
AV Peak grad: 8.1 mmHg
Ao pk vel: 1.42 m/s
Area-P 1/2: 4.26 cm2
S' Lateral: 2.1 cm

## 2021-08-23 LAB — GLUCOSE, CAPILLARY: Glucose-Capillary: 91 mg/dL (ref 70–99)

## 2021-08-23 LAB — PROCALCITONIN: Procalcitonin: <0.1 ng/mL

## 2021-08-23 MED ORDER — AMIODARONE HCL 200 MG PO TABS
200.0000 mg | ORAL_TABLET | Freq: Two times a day (BID) | ORAL | Status: DC
  Administered 2021-08-23 – 2021-08-24 (×2): 200 mg via ORAL
  Filled 2021-08-23 (×2): qty 1

## 2021-08-23 MED ORDER — AMIODARONE HCL 200 MG PO TABS
200.0000 mg | ORAL_TABLET | Freq: Once | ORAL | Status: AC
  Administered 2021-08-23: 200 mg via ORAL

## 2021-08-23 MED ORDER — METOPROLOL SUCCINATE ER 25 MG PO TB24
12.5000 mg | ORAL_TABLET | Freq: Every day | ORAL | Status: DC
  Administered 2021-08-23: 12.5 mg via ORAL
  Filled 2021-08-23 (×2): qty 0.5

## 2021-08-23 NOTE — Consult Note (Signed)
[place] for Electrolyte Monitoring and Replacement   Recent Labs: Potassium (mmol/L)  Date Value  08/23/2021 4.3   Magnesium (mg/dL)  Date Value  08/22/2021 3.3 (H)   Calcium (mg/dL)  Date Value  08/23/2021 9.1   Albumin (g/dL)  Date Value  08/22/2021 4.2   Phosphorus (mg/dL)  Date Value  08/22/2021 4.4   Sodium (mmol/L)  Date Value  08/23/2021 135     Assessment: 66 y.o  female with significant PMH of CAD, HTN, hypothyroidism, Chiari malformation, DDD, disorder, atrial fibrillation on Xarelto and metoprolol who presented to the ED with chief complaints of SOB, heart palpitations and light headedness.    Goal of Therapy:  Potassium 4.0 - 5.1 mmol/L Magnesium 2.0 - 2.4 mg/dL All Other Electrolytes WNL  Plan:  No electrolyte replacement warranted for today Recheck electrolytes in am  Dallie Piles ,PharmD Clinical Pharmacist 08/23/2021 7:05 AM

## 2021-08-23 NOTE — Progress Notes (Signed)
Patient asking if she can try just a 200 mg dose of amiodarone to see if she continues to have adverse reaction to lower dose. I will place a one-time order for amiodarone 200 mg x 1.

## 2021-08-23 NOTE — Final Progress Note (Signed)
Patient accepted from PCCM.  TR[facility] will assume attending role on 08/24/2021 at 7 AM

## 2021-08-23 NOTE — Progress Notes (Signed)
*  PRELIMINARY RESULTS* Echocardiogram 2D Echocardiogram has been performed.  Sherrie Sport 08/23/2021, 9:01 AM

## 2021-08-23 NOTE — Progress Notes (Signed)
NAME:  Denise Grant, MRN:  [id number], DOB:  03/04/1955, LOS: 1 ADMISSION DATE:  08/22/2021, CONSULTATION DATE:  08/22/21 REFERRING MD:  Lurline Hare MD, CHIEF COMPLAINT:  SOB   History of Present Illness:  66 y.o  female with significant PMH of CAD, HTN, hypothyroidism, Chiari malformation, DDD, disorder, atrial fibrillation on Xarelto and metoprolol who presented to the ED with chief complaints of SOB, heart palpitations and light headedness.  Patient states she took 12.5 mg metoprolol and 25 mg of flecainide with no improvement so she presented to the ED for further evaluation.   ED Course:on arrival to the ED, the  temperature was 36.9C, the heart rate 179 beats/minute, the blood pressure 118/88 mm Hg, the respiratory rate 30 breaths/minute, and the oxygen saturation 100% on RA.  Stat EKG was obtained and showed patient in atrial fibrillation with RVR,  ventricular rate 170s.  Valsalva maneuvers was attempted but was unsuccessful.  IV access was obtained and the patient was administered an IV diltiazem bolus of 10 mg followed by infusion with no improvement. Synchronized cardioversion was performed at 120 J without success followed by 200 J and additional 200 J without success.  On-call cardiologist was consulted who recommended chemical cardioversion with amiodarone.  Patient was started on amiodarone with improvement in heart rate.   Pertinent Labs/Diagnostics Findings: Chemistry:Na+/ K+:  139/3.6 Glucose: 129 CBC: WBC:11.6 Other Lab findings: PCT: pending  Lactic acid:pending   COVID PCR: pending , Troponin: 7  BNP: 238.8  Imaging:  CXR>  Slight vascular congestion. No consolidation, pleural effusion, or pneumothorax.   Due to ongoing symptoms and high risk for decompensation. PCCM consulted for admission and further management.  Pertinent  Medical History    Actinic keratosis   Allergic rhinitis   Anxiety   Chiari malformation   DDD (degenerative disc disease)   Hypothyroidism    Headache   Insomnia, unspecified   HTN   Mitral valve disorders(424.0)   Osteopenia   Seborrheic dermatitis, unspecified   Atrial Fibrillation on Xarelto    Significant Hospital Events: Including procedures, antibiotic start and stop dates in addition to other pertinent events   8/6: Admitted to the ICU with A-fib RVR s/p cardioversion  Interim History / Subjective:  Underwent synchronized cardioversion.  Now asymptomatic.  She states she is to have ablation in October.  Cardiology to try controller medications.  Does not endorse any specific complaint today.  Objective   Blood pressure (!) 98/56, pulse 64, temperature 97.8 F (36.6 C), temperature source Axillary, resp. rate 12, height '5\' 6"'$  (1.676 m), weight 69 kg, SpO2 100 %.        Intake/Output Summary (Last 24 hours) at 08/23/2021 1712 Last data filed at 08/23/2021 0000 Gross per 24 hour  Intake 240 ml  Output 1 ml  Net 239 ml   Filed Weights   08/21/21 2357  Weight: 69 kg    Examination: GENERAL: 66 year-old  female patient lying in the bed with no acute distress.  EYES: Pupils equal, round, reactive to light. No scleral icterus. Extraocular muscles intact.  HEENT: Head atraumatic, normocephalic. Oropharynx and nasopharynx clear.  NECK:  Supple, no jugular venous distention. No thyroid enlargement, no tenderness.  LUNGS: Normal breath sounds bilaterally, no wheezing, rales,rhonchi or crepitation. No use of accessory muscles of respiration.  CARDIOVASCULAR: S1, S2 normal. No murmurs, rubs, or gallops.  ABDOMEN: Soft, nontender, nondistended. Bowel sounds present. No organomegaly or mass.  EXTREMITIES: No pedal edema, cyanosis, or clubbing.  NEUROLOGIC:  No overt focal deficit noted. PSYCHIATRIC: The patient is alert and oriented x 3.  Normal mood and behavior. SKIN: No obvious rash, lesion, or ulcer.   Resolved Hospital Problem list     Assessment & Plan:  AFib+RVR, Unstable initially, now has stabilized Hx of  Afib on Xarelto and Flecainide, Mitral Valve disorder S/pDC Cardioversion (100-200J biphasic) under sedation x 3 -s/p Cardizem 10 mg bolus x 1 followed by infusion with no improvement -Serial EKGs -Trend Troponins -TSH, FT4 wnl -TTEcho eval for structural abnormalities -CTA Chest: Nonrevealing -Blood Cx for underlying infectious process -On amiodarone -Cardiology EP Consultation for other regimens, ablation, pacer strategies, etc.   HTN  Goal BP <130/80 -Hold Amlodipine and metoprolol while on Amiodarone gtt as above   Hypothyroidism -TSH & Free T4 wnl -Continue Synthroid   Anxiety -Lorazepam PRN  Best Practice (right click and "Reselect all SmartList Selections" daily)   Diet/type: Regular consistency (see orders) DVT prophylaxis: DOAC GI prophylaxis: N/A Lines: N/A Foley:  N/A Code Status:  full code Last date of multidisciplinary goals of care discussion [N/A]  Labs   CBC: Recent Labs  Lab 08/22/21 0015 08/22/21 0442  WBC 11.6* 11.1*  NEUTROABS 7.4  --   HGB 13.0 12.6  HCT 39.8 37.1  MCV 87.9 87.1  PLT 394 063    Basic Metabolic Panel: Recent Labs  Lab 08/22/21 0015 08/22/21 0334 08/22/21 0442 08/22/21 1920 08/23/21 0344 08/23/21 1532  NA 139 139 136  --  135 137  K 3.6 3.4* 3.2*  --  4.3 4.2  CL 108 109 109  --  109 107  CO2 21* 21* 20*  --  22 24  GLUCOSE 129* 118* 104*  --  106* 83  BUN '13 12 11  '$ --  13 15  CREATININE 0.79 0.77 0.59  --  0.72 0.73  CALCIUM 9.7 9.2 8.9  --  9.1 9.0  MG  --  2.0 3.3*  --   --   --   PHOS  --  1.6*  --  4.4  --   --    GFR: Estimated Creatinine Clearance: 63.9 mL/min (by C-G formula based on SCr of 0.73 mg/dL). Recent Labs  Lab 08/22/21 0015 08/22/21 0334 08/22/21 0442 08/23/21 0344  PROCALCITON  --  <0.10  --  <0.10  WBC 11.6*  --  11.1*  --   LATICACIDVEN  --  3.0* 1.7  --     Liver Function Tests: Recent Labs  Lab 08/22/21 0015  AST 31  ALT 24  ALKPHOS 64  BILITOT 0.6  PROT 7.9  ALBUMIN  4.2   No results for input(s): "LIPASE", "AMYLASE" in the last 168 hours. No results for input(s): "AMMONIA" in the last 168 hours.  ABG No results found for: "PHART", "PCO2ART", "PO2ART", "HCO3", "TCO2", "ACIDBASEDEF", "O2SAT"   Coagulation Profile: Recent Labs  Lab 08/22/21 0220  INR 1.8*    Cardiac Enzymes: No results for input(s): "CKTOTAL", "CKMB", "CKMBINDEX", "TROPONINI" in the last 168 hours.  HbA1C: No results found for: "HGBA1C"  CBG: Recent Labs  Lab 08/22/21 0745  GLUCAP 91    Review of Systems:   A 10 point review of systems was performed and it is as noted above otherwise negative.  Allergies Allergies  Allergen Reactions   Fentanyl Other (See Comments)    Seizure    Buspar [Buspirone]     Felt funny   Calcium     REACTION: cannot tolerate any calcium supplement - GI  sympotms   Fluticasone Propionate     REACTION: headache/irritation   Hctz [Hydrochlorothiazide] Other (See Comments)    Eye redness and discomfort/ slt blurred vision    Paroxetine     REACTION: abdominal pain and constipation   Zoloft [Sertraline]     Diarrhea    Hydroxychloroquine Itching and Rash     Home Medications  Prior to Admission medications   Medication Sig Start Date End Date Taking? Authorizing Provider  amLODipine (NORVASC) 5 MG tablet Take 1 tablet (5 mg total) by mouth daily. 08/31/20  Yes Tower, Wynelle Fanny, MD  DENTA 5000 PLUS 1.1 % CREA dental cream Take 1 application by mouth at bedtime.  08/31/19  Yes [provider]  levothyroxine (SYNTHROID) 50 MCG tablet TAKE 1 TABLET BY MOUTH EVERY DAY BEFORE BREAKFAST 08/20/21  Yes Tower, Wynelle Fanny, MD  LORazepam (ATIVAN) 1 MG tablet TAKE A HALF TABLET BY MOUTH AT BEDTIME AS NEEDED 03/29/21  Yes Tower, Marne A, MD  metoprolol succinate (TOPROL-XL) 25 MG 24 hr tablet TAKE 1/2 TABLET BY MOUTH EVERY DAY 05/24/21  Yes Fenton, Clint R, PA  mometasone (ELOCON) 0.1 % ointment Apply topically daily. Apply to itchy areas on the back  bid prn 03/05/20  Yes Ralene Bathe, MD  predniSONE 2 MG TBEC Take 2 mg by mouth 2 (two) times daily with a meal. 05/03/21  Yes [provider]  XARELTO 20 MG TABS tablet TAKE 1 TABLET BY MOUTH DAILY WITH SUPPER. 02/17/21  Yes Fenton, Clint R, PA  flecainide (TAMBOCOR) 50 MG tablet Take 25 mg by mouth 2 (two) times daily. Patient not taking: Reported on 08/22/2021 05/11/21   [provider]  Tavaborole [place] Mon) 5 % SOLN Apply to toenails at bedtme Patient not taking: Reported on 08/22/2021 03/05/20   Ralene Bathe, MD    Scheduled Meds:  amiodarone  200 mg Oral BID   Chlorhexidine Gluconate Cloth  6 each Topical Q0600   diazepam  2 mg Intravenous Once   levothyroxine  50 mcg Oral Q0600   metoprolol succinate  12.5 mg Oral Daily   predniSONE  2 mg Oral BID WC   rivaroxaban  20 mg Oral Q supper   Continuous Infusions: PRN Meds:.docusate sodium, HYDROcodone-acetaminophen, LORazepam, polyethylene glycol   3 follow-up    Patient was discussed in multidisciplinary rounds.  Renold Don, MD Advanced Bronchoscopy PCCM [facility]    *This note was dictated using voice recognition software/Dragon.  Despite best efforts to proofread, errors can occur which can change the meaning. Any transcriptional errors that result from this process are unintentional and may not be fully corrected at the time of dictation.

## 2021-08-23 NOTE — Progress Notes (Signed)
Progress Note  Patient Name: Denise Grant Date of Encounter: 08/23/2021  Primary Cardiologist: Curt Bears  Subjective   Maintaining sinus rhythm. Declined oral amiodarone last evening indicating this caused "skin crawling" and jitteriness. No chest pain or dyspnea.    Inpatient Medications    Scheduled Meds:  amiodarone  400 mg Oral BID   Chlorhexidine Gluconate Cloth  6 each Topical Q0600   diazepam  2 mg Intravenous Once   levothyroxine  50 mcg Oral Q0600   predniSONE  2 mg Oral BID WC   rivaroxaban  20 mg Oral Q supper   Continuous Infusions:  PRN Meds: docusate sodium, HYDROcodone-acetaminophen, LORazepam, polyethylene glycol   Vital Signs    Vitals:   08/23/21 0400 08/23/21 0500 08/23/21 0600 08/23/21 0700  BP: (!) 97/56 101/60 108/72 96/62  Pulse: 62 62 (!) 55 63  Resp: '16 15 15 15  '$ Temp:      TempSrc:      SpO2: 99% 100% 99% 100%  Weight:      Height:        Intake/Output Summary (Last 24 hours) at 08/23/2021 0750 Last data filed at 08/23/2021 0000 Gross per 24 hour  Intake 1036.86 ml  Output 1 ml  Net 1035.86 ml   Filed Weights   08/21/21 2357  Weight: 69 kg    Telemetry    Sinus rhythm with sinus bradycardia - Personally Reviewed  ECG    No new tracings - Personally Reviewed  Physical Exam   GEN: No acute distress.   Neck: No JVD. Cardiac: RRR, no murmurs, rubs, or gallops.  Respiratory: Clear to auscultation bilaterally.  GI: Soft, nontender, non-distended.   MS: No edema; No deformity. Neuro:  Alert and oriented x 3; Nonfocal.  Psych: Normal affect.  Labs    Chemistry Recent Labs  Lab 08/22/21 0015 08/22/21 0334 08/22/21 0442 08/23/21 0344  NA 139 139 136 135  K 3.6 3.4* 3.2* 4.3  CL 108 109 109 109  CO2 21* 21* 20* 22  GLUCOSE 129* 118* 104* 106*  BUN '13 12 11 13  '$ CREATININE 0.79 0.77 0.59 0.72  CALCIUM 9.7 9.2 8.9 9.1  PROT 7.9  --   --   --   ALBUMIN 4.2  --   --   --   AST 31  --   --   --   ALT 24  --   --   --    ALKPHOS 64  --   --   --   BILITOT 0.6  --   --   --   GFRNONAA >60 >60 >60 >60  ANIONGAP '10 9 7 '$ 4*     Hematology Recent Labs  Lab 08/22/21 0015 08/22/21 0442  WBC 11.6* 11.1*  RBC 4.53 4.26  HGB 13.0 12.6  HCT 39.8 37.1  MCV 87.9 87.1  MCH 28.7 29.6  MCHC 32.7 34.0  RDW 13.5 13.2  PLT 394 318    Cardiac EnzymesNo results for input(s): "TROPONINI" in the last 168 hours. No results for input(s): "TROPIPOC" in the last 168 hours.   BNP Recent Labs  Lab 08/22/21 0015  BNP 238.8*     DDimer No results for input(s): "DDIMER" in the last 168 hours.   Radiology    CT Angio Chest Pulmonary Embolism (PE) W or WO Contrast  Result Date: 08/22/2021 IMPRESSION: 1. No evidence for acute pulmonary embolism. 2. Mosaic attenuation pattern identified bilaterally with scattered areas of air trapping, likely representing small  airways disease. 3. Aortic Atherosclerosis (ICD10-I70.0). Electronically Signed   By: Kerby Moors M.D.   On: 08/22/2021 04:53   DG Chest Portable 1 View  Result Date: 08/22/2021 IMPRESSION: Slight vascular congestion. Electronically Signed   By: Keith Rake M.D.   On: 08/22/2021 00:41    Cardiac Studies   2D echo pending  Patient Profile     66 y.o. female with history of PAF Xarelto with planned Afib ablation in 10/2021, Chiari malformation, polymyalgia rheumatica, hypertension, hypothyroidism, mitral valve prolapse, and anxiety who we are seeing of Afib with RVR.   Assessment & Plan    1. PAF: -Converted to sinus rhythm on IV amiodarone -Maintaining sinus rhythm  -Declined amiodarone last evening with reported "skin crawling" and jitteriness, will discontinue  -Previously noted to be intolerant to flecainide  -Continue to hold amiodarone given adverse effect -Transition back to Toprol XL  -Continue PTA Xarelto (CrCl 82) -Follow up with EP  2. Elevated high sensitivity troponin: -Minimally elevated and flat trending -Not consistent with  ACS -Likely supply demand ischemia in the setting of Afib with RVR -Echo pending -Outpatient follow up with primary cardiologist  3. Hypokalemia: -Repleted  4. HTN: -Blood pressure 947M mmHg systolic during rounds -Resume PTA Toprol  -Continue to hold PTA amlodipine for now     For questions or updates, please contact [place] Please consult [email or web address] for contact info under Cardiology/STEMI.    Signed, Christell Faith, PA-C [place] Pager: [phone] 08/23/2021, 7:50 AM

## 2021-08-24 DIAGNOSIS — I4891 Unspecified atrial fibrillation: Secondary | ICD-10-CM | POA: Diagnosis not present

## 2021-08-24 DIAGNOSIS — I480 Paroxysmal atrial fibrillation: Secondary | ICD-10-CM | POA: Diagnosis not present

## 2021-08-24 LAB — CBC
HCT: 40.8 % (ref 36.0–46.0)
Hemoglobin: 13.5 g/dL (ref 12.0–15.0)
MCH: 28.5 pg (ref 26.0–34.0)
MCHC: 33.1 g/dL (ref 30.0–36.0)
MCV: 86.1 fL (ref 80.0–100.0)
Platelets: 331 10*3/uL (ref 150–400)
RBC: 4.74 MIL/uL (ref 3.87–5.11)
RDW: 13.7 % (ref 11.5–15.5)
WBC: 8.5 10*3/uL (ref 4.0–10.5)
nRBC: 0 % (ref 0.0–0.2)

## 2021-08-24 LAB — PROCALCITONIN: Procalcitonin: <0.1 ng/mL

## 2021-08-24 LAB — MAGNESIUM: Magnesium: 2.3 mg/dL (ref 1.7–2.4)

## 2021-08-24 MED ORDER — AMIODARONE HCL 200 MG PO TABS: ORAL_TABLET | ORAL | 0 refills | Status: DC

## 2021-08-24 NOTE — Discharge Summary (Signed)
Physician Discharge Summary  Denise [place]:[id number] DOB: Dec 07, 1955 DOA: 08/22/2021  PCP: Abner Greenspan, MD  Admit date: 08/22/2021  Discharge date: 08/24/2021  Admitted From: Home.  Disposition:  Home.  Recommendations for Outpatient Follow-up:  Follow up with PCP in 1-2 weeks. Please obtain BMP/CBC in one week. Advised to follow-up with cardiology as scheduled. Advised to take amiodarone 200 mg p.o. twice daily for 1 week followed by 200 mg p.o. daily. Advised to continue Xarelto for anticoagulation. Advised to follow-up with EP physician as scheduled.  Home Health:None Equipment/Devices:None  Discharge Condition: Stable CODE STATUS:Full code Diet recommendation: Heart Healthy  Brief [facility] Course: This 66 years old female with PMH significant for CAD, hypertension, hypothyroidism, Chiari malformation, degenerative disc disease, atrial fibrillation on Xarelto and metoprolol presented to the ED with chief complaints of shortness of breath, palpitations and lightheadedness.  Patient has taken 12.5 mg of metoprolol and 25 mg of flecainide without any improvement so she presented in the ED.  She was found to have A-fib with RVR on arrival.  Valsalva maneuvers were attempted but were unsuccessful.  Patient was given IV Cardizem without any improvement.  Synchronized cardioversion was attempted  three times without success.  On-call cardiologist was consulted who recommended chemical cardioversion with amiodarone.  Patient was admitted in ICU and remained in ICU and was managed with amiodarone infusion.  Heart rate is controlled and she converted to normal sinus rhythm.  Patient was cleared from cardiology to be discharged.  Cardiology advised to take amiodarone 200 mg twice daily for 1 week followed by 200 mg daily.  Follow-up with the EP physician as an outpatient.  Patient feels better want to be discharged.  Patient is being discharged home   Discharge Diagnoses:   Principal Problem:   Atrial fibrillation with RVR (HCC)  PAF: -Converted to sinus rhythm on IV amiodarone -Maintaining sinus rhythm  -Declined amiodarone last evening with reported "skin crawling" and jitteriness, will discontinue  -Previously noted to be intolerant to flecainide  -Transition to oral amiodarone,  maintaining sinus rhythm. -Toprol kept on hold due to bradycardia -Continue PTA Xarelto (CrCl 82) -Follow up with EP   Elevated high sensitivity troponin: -Minimally elevated and flat trending -Not consistent with ACS -Likely supply demand ischemia in the setting of Afib with RVR -Echo pending -Outpatient follow up with primary cardiologist   Hypokalemia: -Repleted    HTN: -Blood pressure 831D mmHg systolic during rounds -Resume PTA Toprol  -Continue to hold PTA amlodipine for now  Discharge Instructions  Discharge Instructions     Amb referral to [facility]   Complete by: As directed    Call MD for:  difficulty breathing, headache or visual disturbances   Complete by: As directed    Call MD for:  persistant dizziness or light-headedness   Complete by: As directed    Call MD for:  persistant nausea and vomiting   Complete by: As directed    Diet - low sodium heart healthy   Complete by: As directed    Diet Carb Modified   Complete by: As directed    Discharge instructions   Complete by: As directed    Advised to follow-up with primary care physician in 1 week. Advised to follow-up with cardiology as scheduled. Advised to take amiodarone 200 mg p.o. twice daily for 1 week followed by 200 mg p.o. daily. Advised to continue Xarelto for anticoagulation. Advised to follow-up with EP physician as scheduled.   Increase activity slowly  Complete by: As directed       Allergies as of 08/24/2021       Reactions   Fentanyl Other (See Comments)   Seizure    Buspar [buspirone]    Felt funny   Calcium    REACTION: cannot tolerate any calcium supplement -  GI sympotms   Fluticasone Propionate    REACTION: headache/irritation   Hctz [hydrochlorothiazide] Other (See Comments)   Eye redness and discomfort/ slt blurred vision    Paroxetine    REACTION: abdominal pain and constipation   Zoloft [sertraline]    Diarrhea    Hydroxychloroquine Itching, Rash        Medication List     STOP taking these medications    amLODipine 5 MG tablet Commonly known as: NORVASC   flecainide 50 MG tablet Commonly known as: TAMBOCOR   metoprolol succinate 25 MG 24 hr tablet Commonly known as: TOPROL-XL   Tavaborole 5 % Soln Commonly known as: Kerydin       TAKE these medications    amiodarone 200 MG tablet Commonly known as: PACERONE Advised to take amiodarone 200 mg p.o. twice daily for 1 week followed by 200 mg p.o. daily.   Denta 5000 Plus 1.1 % Crea dental cream Generic drug: sodium fluoride Take 1 application by mouth at bedtime.   levothyroxine 50 MCG tablet Commonly known as: SYNTHROID TAKE 1 TABLET BY MOUTH EVERY DAY BEFORE BREAKFAST   LORazepam 1 MG tablet Commonly known as: ATIVAN TAKE A HALF TABLET BY MOUTH AT BEDTIME AS NEEDED   mometasone 0.1 % ointment Commonly known as: ELOCON Apply topically daily. Apply to itchy areas on the back bid prn   predniSONE 2 MG Tbec Take 2 mg by mouth 2 (two) times daily with a meal.   Xarelto 20 MG Tabs tablet Generic drug: rivaroxaban TAKE 1 TABLET BY MOUTH DAILY WITH SUPPER.        Follow-up Information     Tower, Wynelle Fanny, MD Follow up in 1 week(s).   Specialties: Family Medicine, Radiology Contact information: [place] [address] [phone]         Constance Haw, MD Follow up in 1 week(s).   Specialty: Cardiology Contact information: [address] [place] [address] [phone]                Allergies  Allergen Reactions   Fentanyl Other (See Comments)    Seizure    Buspar [Buspirone]     Felt  funny   Calcium     REACTION: cannot tolerate any calcium supplement - GI sympotms   Fluticasone Propionate     REACTION: headache/irritation   Hctz [Hydrochlorothiazide] Other (See Comments)    Eye redness and discomfort/ slt blurred vision    Paroxetine     REACTION: abdominal pain and constipation   Zoloft [Sertraline]     Diarrhea    Hydroxychloroquine Itching and Rash    Consultations: Cardiology PCCM   Procedures/Studies: ECHOCARDIOGRAM COMPLETE  Result Date: 08/23/2021    ECHOCARDIOGRAM REPORT   Patient Name:   Denise [place] Date of Exam: 08/23/2021 Medical Rec #:  [id number]       Height:       66.0 in Accession #:    [id number]      Weight:       152.1 lb Date of Birth:  1955/11/21       BSA:  1.780 m Patient Age:    19 years        BP:           96/62 mmHg Patient Gender: F               HR:           63 bpm. Exam Location:  [facility] Procedure: 2D Echo, Cardiac Doppler and Color Doppler Indications:     Atrial Fibrillation I48.91  History:         Patient has prior history of Echocardiogram examinations, most                  recent 09/26/2018. Mitral valve disorder.  Sonographer:     Sherrie Sport Referring Phys:  Wallace Diagnosing Phys: Kathlyn Sacramento MD  Sonographer Comments: Suboptimal apical window. IMPRESSIONS  1. Left ventricular ejection fraction, by estimation, is 65 to 70%. The left ventricle has normal function. The left ventricle has no regional wall motion abnormalities. There is mild left ventricular hypertrophy. Left ventricular diastolic parameters were normal.  2. Right ventricular systolic function is normal. The right ventricular size is normal. Tricuspid regurgitation signal is inadequate for assessing PA pressure.  3. The mitral valve is normal in structure. Trivial mitral valve regurgitation. No evidence of mitral stenosis.  4. The aortic valve is normal in structure. Aortic valve regurgitation is trivial. Aortic valve sclerosis is present,  with no evidence of aortic valve stenosis.  5. The inferior vena cava is normal in size with greater than 50% respiratory variability, suggesting right atrial pressure of 3 mmHg. FINDINGS  Left Ventricle: Left ventricular ejection fraction, by estimation, is 65 to 70%. The left ventricle has normal function. The left ventricle has no regional wall motion abnormalities. The left ventricular internal cavity size was normal in size. There is  mild left ventricular hypertrophy. Left ventricular diastolic parameters were normal. Right Ventricle: The right ventricular size is normal. No increase in right ventricular wall thickness. Right ventricular systolic function is normal. Tricuspid regurgitation signal is inadequate for assessing PA pressure. The tricuspid regurgitant velocity is 1.90 m/s, and with an assumed right atrial pressure of 3 mmHg, the estimated right ventricular systolic pressure is 16.1 mmHg. Left Atrium: Left atrial size was normal in size. Right Atrium: Right atrial size was normal in size. Pericardium: There is no evidence of pericardial effusion. Mitral Valve: The mitral valve is normal in structure. Mild mitral annular calcification. Trivial mitral valve regurgitation. No evidence of mitral valve stenosis. Tricuspid Valve: The tricuspid valve is normal in structure. Tricuspid valve regurgitation is trivial. No evidence of tricuspid stenosis. Aortic Valve: The aortic valve is normal in structure. Aortic valve regurgitation is trivial. Aortic valve sclerosis is present, with no evidence of aortic valve stenosis. Aortic valve mean gradient measures 5.0 mmHg. Aortic valve peak gradient measures 8.1 mmHg. Aortic valve area, by VTI measures 3.11 cm. Pulmonic Valve: The pulmonic valve was normal in structure. Pulmonic valve regurgitation is not visualized. No evidence of pulmonic stenosis. Aorta: The aortic root is normal in size and structure. Venous: The inferior vena cava is normal in size with greater  than 50% respiratory variability, suggesting right atrial pressure of 3 mmHg. IAS/Shunts: No atrial level shunt detected by color flow Doppler.  LEFT VENTRICLE PLAX 2D LVIDd:         3.30 cm   Diastology LVIDs:         2.10 cm   LV e' medial:  5.55 cm/s LV PW:         1.00 cm   LV E/e' medial:  12.1 LV IVS:        0.90 cm   LV e' lateral:   11.50 cm/s LVOT diam:     2.00 cm   LV E/e' lateral: 5.9 LV SV:         80 LV SV Index:   45 LVOT Area:     3.14 cm  RIGHT VENTRICLE RV Basal diam:  2.80 cm RV S prime:     14.70 cm/s TAPSE (M-mode): 1.1 cm LEFT ATRIUM             Index        RIGHT ATRIUM           Index LA diam:        2.70 cm 1.52 cm/m   RA Area:     13.70 cm LA Vol (A2C):   21.8 ml 12.25 ml/m  RA Volume:   31.70 ml  17.81 ml/m LA Vol (A4C):   14.8 ml 8.31 ml/m LA Biplane Vol: 18.5 ml 10.39 ml/m  AORTIC VALVE AV Area (Vmax):    2.81 cm AV Area (Vmean):   2.78 cm AV Area (VTI):     3.11 cm AV Vmax:           142.00 cm/s AV Vmean:          106.350 cm/s AV VTI:            0.258 m AV Peak Grad:      8.1 mmHg AV Mean Grad:      5.0 mmHg LVOT Vmax:         127.00 cm/s LVOT Vmean:        94.200 cm/s LVOT VTI:          0.256 m LVOT/AV VTI ratio: 0.99  AORTA Ao Root diam: 3.27 cm MITRAL VALVE               TRICUSPID VALVE MV Area (PHT): 4.26 cm    TR Peak grad:   14.4 mmHg MV Decel Time: 178 msec    TR Vmax:        190.00 cm/s MV E velocity: 67.30 cm/s MV A velocity: 77.10 cm/s  SHUNTS MV E/A ratio:  0.87        Systemic VTI:  0.26 m                            Systemic Diam: 2.00 cm Kathlyn Sacramento MD Electronically signed by Kathlyn Sacramento MD Signature Date/Time: 08/23/2021/1:13:33 PM    Final    CT Angio Chest Pulmonary Embolism (PE) W or WO Contrast  Result Date: 08/22/2021 CLINICAL DATA:  Chest wall pain.  Atrial fibrillation. EXAM: CT ANGIOGRAPHY CHEST WITH CONTRAST TECHNIQUE: Multidetector CT imaging of the chest was performed using the standard protocol during bolus administration of intravenous  contrast. Multiplanar CT image reconstructions and MIPs were obtained to evaluate the vascular anatomy. RADIATION DOSE REDUCTION: This exam was performed according to the departmental dose-optimization program which includes automated exposure control, adjustment of the mA and/or kV according to patient size and/or use of iterative reconstruction technique. CONTRAST:  83m OMNIPAQUE IOHEXOL 350 MG/ML SOLN COMPARISON:  None Available. FINDINGS: Cardiovascular: Satisfactory opacification of the pulmonary arteries to the segmental level. No evidence of pulmonary embolism. Aortic atherosclerosis. Normal heart size. No pericardial effusion. Mediastinum/Nodes: No enlarged mediastinal,  hilar, or axillary lymph nodes. Thyroid gland, trachea, and esophagus demonstrate no significant findings. Lungs/Pleura: No pleural effusion. No airspace consolidation, atelectasis or pneumothorax. There is a mosaic attenuation pattern identified bilaterally with scattered areas of air trapping, likely representing small airways disease. Upper Abdomen: No acute abnormality. Musculoskeletal: No chest wall abnormality. No acute or significant osseous findings. Review of the MIP images confirms the above findings. IMPRESSION: 1. No evidence for acute pulmonary embolism. 2. Mosaic attenuation pattern identified bilaterally with scattered areas of air trapping, likely representing small airways disease. 3. Aortic Atherosclerosis (ICD10-I70.0). Electronically Signed   By: Kerby Moors M.D.   On: 08/22/2021 04:53   DG Chest Portable 1 View  Result Date: 08/22/2021 CLINICAL DATA:  Tachycardia.  Atrial fibrillation. EXAM: PORTABLE CHEST 1 VIEW COMPARISON:  05/04/2021 FINDINGS: Stable heart size. The cardiomediastinal contours are normal. Slight vascular congestion. No consolidation, pleural effusion, or pneumothorax. No acute osseous abnormalities are seen. IMPRESSION: Slight vascular congestion. Electronically Signed   By: Keith Rake M.D.    On: 08/22/2021 00:41   DG Knee 4 Views W/Patella Right  Result Date: 08/17/2021 CLINICAL DATA:  Right knee pain.  No known injury. EXAM: RIGHT KNEE - COMPLETE 4+ VIEW COMPARISON:  No recent prior. FINDINGS: Diffuse osteopenia. Diffuse tricompartment degenerative change. A questionable rounded lucency is noted in the proximal right tibial metaphysis on lateral view. Further evaluation with MRI of the right knee suggested. No evidence of fracture or dislocation. Right knee joint effusion noted. IMPRESSION: 1. Diffuse osteopenia and tricompartment degenerative change. Right knee joint effusion noted. 2. A questionable rounded lucency is noted the proximal right tibial metaphysis on lateral view. Further evaluation with MRI of the right knee suggested. Electronically Signed   By: Marcello Moores  Register M.D.   On: 08/17/2021 09:02   US Venous Img Lower Unilateral Right (DVT)  Result Date: 08/16/2021 CLINICAL DATA:  Right lower leg swelling and pain EXAM: Right LOWER EXTREMITY VENOUS DOPPLER ULTRASOUND TECHNIQUE: Gray-scale sonography with compression, as well as color and duplex ultrasound, were performed to evaluate the deep venous system(s) from the level of the common femoral vein through the popliteal and proximal calf veins. COMPARISON:  None Available. FINDINGS: VENOUS Normal compressibility of the common femoral, superficial femoral, and popliteal veins, as well as the visualized calf veins. Visualized portions of profunda femoral vein and great saphenous vein unremarkable. No filling defects to suggest DVT on grayscale or color Doppler imaging. Doppler waveforms show normal direction of venous flow, normal respiratory plasticity and response to augmentation. Limited views of the contralateral common femoral vein are unremarkable. OTHER There is a 3.4 cm fluid collection in the right popliteal fossa most likely reflecting a Baker's cyst. Limitations: none IMPRESSION: 1. No evidence of DVT in the right lower  extremity. 2. Probable right Baker's cyst. Electronically Signed   By: Valetta Mole M.D.   On: 08/16/2021 15:23    Subjective: Patient was seen and examined at bedside.  Overnight events noted.   Patient reports feeling better , maintaining in sinus rhythm , Patient wants to be discharged.  Patient is being discharged home  Discharge Exam: Vitals:   08/24/21 0841 08/24/21 1221  BP: 112/70 119/65  Pulse: (!) 56   Resp: 10 14  Temp:  97.8 F (36.6 C)  SpO2: 100% 99%   Vitals:   08/24/21 0700 08/24/21 0743 08/24/21 0841 08/24/21 1221  BP: 116/69  112/70 119/65  Pulse:   (!) 56   Resp: 11  10 14  Temp:  97.6 F (36.4 C)  97.8 F (36.6 C)  TempSrc:  Oral  Oral  SpO2:   100% 99%  Weight:      Height:        General: Pt is alert, awake, not in acute distress Cardiovascular: RRR, S1/S2 +, no rubs, no gallops Respiratory: CTA bilaterally, no wheezing, no rhonchi Abdominal: Soft, NT, ND, bowel sounds + Extremities: no edema, no cyanosis    The results of significant diagnostics from this hospitalization (including imaging, microbiology, ancillary and laboratory) are listed below for reference.     Microbiology: Recent Results (from the past 240 hour(s))  Culture, blood (Routine X 2) w Reflex to ID Panel     Status: None (Preliminary result)   Collection Time: 08/22/21  3:34 AM   Specimen: BLOOD  Result Value Ref Range Status   Specimen Description BLOOD RIGHT ANTECUBITAL  Final   Special Requests   Final    BOTTLES DRAWN AEROBIC AND ANAEROBIC Blood Culture adequate volume   Culture   Final    NO GROWTH 2 DAYS Performed at [facility], [address], [place], [place] [address]    Report Status PENDING  Incomplete  Culture, blood (Routine X 2) w Reflex to ID Panel     Status: None (Preliminary result)   Collection Time: 08/22/21  3:34 AM   Specimen: BLOOD  Result Value Ref Range Status   Specimen Description BLOOD LEFT ANTECUBITAL  Final   Special Requests    Final    BOTTLES DRAWN AEROBIC AND ANAEROBIC Blood Culture adequate volume   Culture   Final    NO GROWTH 2 DAYS Performed at [facility], [address], [place], [place] [address]    Report Status PENDING  Incomplete  MRSA Next Gen by PCR, Nasal     Status: None   Collection Time: 08/22/21  8:02 AM   Specimen: Nasal Mucosa; Nasal Swab  Result Value Ref Range Status   MRSA by PCR Next Gen NOT DETECTED NOT DETECTED Final    Comment: (NOTE) The GeneXpert MRSA Assay (FDA approved for NASAL specimens only), is one component of a comprehensive MRSA colonization surveillance program. It is not intended to diagnose MRSA infection nor to guide or monitor treatment for MRSA infections. Test performance is not FDA approved in patients less than 59 years old. Performed at [facility], [address] [place], [place] [address]      Labs: BNP (last 3 results) Recent Labs    05/04/21 1833 08/22/21 0015  BNP 92.3 431.5*   Basic Metabolic Panel: Recent Labs  Lab 08/22/21 0015 08/22/21 0334 08/22/21 0442 08/22/21 1920 08/23/21 0344 08/23/21 1532 08/24/21 0409  NA 139 139 136  --  135 137  --   K 3.6 3.4* 3.2*  --  4.3 4.2  --   CL 108 109 109  --  109 107  --   CO2 21* 21* 20*  --  22 24  --   GLUCOSE 129* 118* 104*  --  106* 83  --   BUN '13 12 11  '$ --  13 15  --   CREATININE 0.79 0.77 0.59  --  0.72 0.73  --   CALCIUM 9.7 9.2 8.9  --  9.1 9.0  --   MG  --  2.0 3.3*  --   --   --  2.3  PHOS  --  1.6*  --  4.4  --   --   --  Liver Function Tests: Recent Labs  Lab 08/22/21 0015  AST 31  ALT 24  ALKPHOS 64  BILITOT 0.6  PROT 7.9  ALBUMIN 4.2   No results for input(s): "LIPASE", "AMYLASE" in the last 168 hours. No results for input(s): "AMMONIA" in the last 168 hours. CBC: Recent Labs  Lab 08/22/21 0015 08/22/21 0442 08/24/21 0409  WBC 11.6* 11.1* 8.5  NEUTROABS 7.4  --   --   HGB 13.0 12.6 13.5  HCT 39.8 37.1 40.8  MCV 87.9 87.1 86.1   PLT 394 318 331   Cardiac Enzymes: No results for input(s): "CKTOTAL", "CKMB", "CKMBINDEX", "TROPONINI" in the last 168 hours. BNP: Invalid input(s): "POCBNP" CBG: Recent Labs  Lab 08/22/21 0745  GLUCAP 91   D-Dimer No results for input(s): "DDIMER" in the last 72 hours. Hgb A1c No results for input(s): "HGBA1C" in the last 72 hours. Lipid Profile No results for input(s): "CHOL", "HDL", "LDLCALC", "TRIG", "CHOLHDL", "LDLDIRECT" in the last 72 hours. Thyroid function studies Recent Labs    08/22/21 0015  TSH 4.364   Anemia work up No results for input(s): "VITAMINB12", "FOLATE", "FERRITIN", "TIBC", "IRON", "RETICCTPCT" in the last 72 hours. Urinalysis    Component Value Date/Time   COLORURINE COLORLESS (A) 08/22/2021 0201   APPEARANCEUR CLEAR (A) 08/22/2021 0201   LABSPEC 1.005 08/22/2021 0201   PHURINE 9.0 (H) 08/22/2021 0201   GLUCOSEU NEGATIVE 08/22/2021 0201   HGBUR SMALL (A) 08/22/2021 0201   BILIRUBINUR NEGATIVE 08/22/2021 0201   BILIRUBINUR - 07/22/2015 1113   KETONESUR NEGATIVE 08/22/2021 0201   PROTEINUR NEGATIVE 08/22/2021 0201   UROBILINOGEN negative 07/22/2015 1113   NITRITE NEGATIVE 08/22/2021 0201   LEUKOCYTESUR NEGATIVE 08/22/2021 0201   Sepsis Labs Recent Labs  Lab 08/22/21 0015 08/22/21 0442 08/24/21 0409  WBC 11.6* 11.1* 8.5   Microbiology Recent Results (from the past 240 hour(s))  Culture, blood (Routine X 2) w Reflex to ID Panel     Status: None (Preliminary result)   Collection Time: 08/22/21  3:34 AM   Specimen: BLOOD  Result Value Ref Range Status   Specimen Description BLOOD RIGHT ANTECUBITAL  Final   Special Requests   Final    BOTTLES DRAWN AEROBIC AND ANAEROBIC Blood Culture adequate volume   Culture   Final    NO GROWTH 2 DAYS Performed at [facility], [address], [place], [place] [address]    Report Status PENDING  Incomplete  Culture, blood (Routine X 2) w Reflex to ID Panel     Status: None (Preliminary  result)   Collection Time: 08/22/21  3:34 AM   Specimen: BLOOD  Result Value Ref Range Status   Specimen Description BLOOD LEFT ANTECUBITAL  Final   Special Requests   Final    BOTTLES DRAWN AEROBIC AND ANAEROBIC Blood Culture adequate volume   Culture   Final    NO GROWTH 2 DAYS Performed at [facility], [address] [place], [place] [address]    Report Status PENDING  Incomplete  MRSA Next Gen by PCR, Nasal     Status: None   Collection Time: 08/22/21  8:02 AM   Specimen: Nasal Mucosa; Nasal Swab  Result Value Ref Range Status   MRSA by PCR Next Gen NOT DETECTED NOT DETECTED Final    Comment: (NOTE) The GeneXpert MRSA Assay (FDA approved for NASAL specimens only), is one component of a comprehensive MRSA colonization surveillance program. It is not intended to diagnose MRSA infection nor to guide or  monitor treatment for MRSA infections. Test performance is not FDA approved in patients less than 88 years old. Performed at [facility], [address] [place], [place] [address]      Time coordinating discharge: Over 30 minutes  SIGNED:   Shawna Clamp, MD  [facility] 08/24/2021, 5:33 PM Pager   If 7PM-7AM, please contact night-coverage

## 2021-08-24 NOTE — Progress Notes (Signed)
Pt discharged home in no acute distress. Discharge instructions provided. No questions at this time per pt. Transferred in a wheelchair with volunteer services. Traveling home with son-in-law.

## 2021-08-24 NOTE — Progress Notes (Signed)
Progress Note  Patient Name: Denise Grant Date of Encounter: 08/24/2021  Primary Cardiologist: Curt Bears  Subjective   Maintaining sinus rhythm. Tolerated lower dose amiodarone. No chest pain or dyspnea.    Inpatient Medications    Scheduled Meds:  amiodarone  200 mg Oral BID   Chlorhexidine Gluconate Cloth  6 each Topical Q0600   diazepam  2 mg Intravenous Once   levothyroxine  50 mcg Oral Q0600   metoprolol succinate  12.5 mg Oral Daily   predniSONE  2 mg Oral BID WC   rivaroxaban  20 mg Oral Q supper   Continuous Infusions:  PRN Meds: docusate sodium, HYDROcodone-acetaminophen, LORazepam, polyethylene glycol   Vital Signs    Vitals:   08/24/21 0200 08/24/21 0300 08/24/21 0400 08/24/21 0500  BP: 95/73 113/82 (!) 119/98   Pulse: (!) 59     Resp: '12 13 19 13  '$ Temp: 98.3 F (36.8 C)     TempSrc: Oral     SpO2: 99%     Weight:    68.9 kg  Height:        Intake/Output Summary (Last 24 hours) at 08/24/2021 0729 Last data filed at 08/23/2021 2100 Gross per 24 hour  Intake 840 ml  Output --  Net 840 ml    Filed Weights   08/21/21 2357 08/24/21 0500  Weight: 69 kg 68.9 kg    Telemetry    Sinus rhythm with sinus bradycardia - Personally Reviewed  ECG    No new tracings - Personally Reviewed  Physical Exam   GEN: No acute distress.   Neck: No JVD. Cardiac: RRR, no murmurs, rubs, or gallops.  Respiratory: Clear to auscultation bilaterally.  GI: Soft, nontender, non-distended.   MS: No edema; No deformity. Neuro:  Alert and oriented x 3; Nonfocal.  Psych: Normal affect.  Labs    Chemistry Recent Labs  Lab 08/22/21 0015 08/22/21 0334 08/22/21 0442 08/23/21 0344 08/23/21 1532  NA 139   < > 136 135 137  K 3.6   < > 3.2* 4.3 4.2  CL 108   < > 109 109 107  CO2 21*   < > 20* 22 24  GLUCOSE 129*   < > 104* 106* 83  BUN 13   < > '11 13 15  '$ CREATININE 0.79   < > 0.59 0.72 0.73  CALCIUM 9.7   < > 8.9 9.1 9.0  PROT 7.9  --   --   --   --    ALBUMIN 4.2  --   --   --   --   AST 31  --   --   --   --   ALT 24  --   --   --   --   ALKPHOS 64  --   --   --   --   BILITOT 0.6  --   --   --   --   GFRNONAA >60   < > >60 >60 >60  ANIONGAP 10   < > 7 4* 6   < > = values in this interval not displayed.      Hematology Recent Labs  Lab 08/22/21 0015 08/22/21 0442 08/24/21 0409  WBC 11.6* 11.1* 8.5  RBC 4.53 4.26 4.74  HGB 13.0 12.6 13.5  HCT 39.8 37.1 40.8  MCV 87.9 87.1 86.1  MCH 28.7 29.6 28.5  MCHC 32.7 34.0 33.1  RDW 13.5 13.2 13.7  PLT 394 318 331  Cardiac EnzymesNo results for input(s): "TROPONINI" in the last 168 hours. No results for input(s): "TROPIPOC" in the last 168 hours.   BNP Recent Labs  Lab 08/22/21 0015  BNP 238.8*      DDimer No results for input(s): "DDIMER" in the last 168 hours.   Radiology    CT Angio Chest Pulmonary Embolism (PE) W or WO Contrast  Result Date: 08/22/2021 IMPRESSION: 1. No evidence for acute pulmonary embolism. 2. Mosaic attenuation pattern identified bilaterally with scattered areas of air trapping, likely representing small airways disease. 3. Aortic Atherosclerosis (ICD10-I70.0). Electronically Signed   By: Kerby Moors M.D.   On: 08/22/2021 04:53   DG Chest Portable 1 View  Result Date: 08/22/2021 IMPRESSION: Slight vascular congestion. Electronically Signed   By: Keith Rake M.D.   On: 08/22/2021 00:41    Cardiac Studies   2D echo 08/23/2021: 1. Left ventricular ejection fraction, by estimation, is 65 to 70%. The  left ventricle has normal function. The left ventricle has no regional  wall motion abnormalities. There is mild left ventricular hypertrophy.  Left ventricular diastolic parameters  were normal.   2. Right ventricular systolic function is normal. The right ventricular  size is normal. Tricuspid regurgitation signal is inadequate for assessing  PA pressure.   3. The mitral valve is normal in structure. Trivial mitral valve  regurgitation.  No evidence of mitral stenosis.   4. The aortic valve is normal in structure. Aortic valve regurgitation is  trivial. Aortic valve sclerosis is present, with no evidence of aortic  valve stenosis.   5. The inferior vena cava is normal in size with greater than 50%  respiratory variability, suggesting right atrial pressure of 3 mmHg.  Patient Profile     66 y.o. female with history of PAF Xarelto with planned Afib ablation in 10/2021, Chiari malformation, polymyalgia rheumatica, hypertension, hypothyroidism, mitral valve prolapse, and anxiety who we are seeing of Afib with RVR.   Assessment & Plan    1. PAF: -Converted to sinus rhythm on IV amiodarone -Maintaining sinus rhythm  -She is concerned about her heart rate on both amiodarone and Toprol XL -Continue amiodarone 200 mg bid for 1 week total followed by 200 mg daily thereafter -Hold Toprol XL given noted bradycardia  -Previously noted to be intolerant to flecainide  -Continue PTA Xarelto (CrCl 82) -Follow up with EP  2. Elevated high sensitivity troponin: -Minimally elevated and flat trending -Not consistent with ACS -Likely supply demand ischemia in the setting of Afib with RVR -Echo as above -Outpatient follow up with primary cardiologist  3. Hypokalemia: -Repleted  4. HTN: -Blood pressure 588F mmHg systolic during rounds -She is concerned about her heart rate on both amiodarone and Toprol, therefore we will hold Toprol XL -If needed, can resume amlodipine for BP   Ok to discharge home from out perspective today     For questions or updates, please contact [place] Please consult [email or web address] for contact info under Cardiology/STEMI.    Signed, Christell Faith, PA-C [place] Pager: [phone] 08/24/2021, 7:29 AM

## 2021-08-25 ENCOUNTER — Other Ambulatory Visit: Payer: Self-pay | Admitting: *Deleted

## 2021-08-25 NOTE — Patient Outreach (Signed)
  Care Coordination [facility] Note Transition Care Management Follow-up Telephone Call Date of discharge and from where: 08/24/21 [facility] How have you been since you were released from the hospital? Patient states she feels weak but she is improving Any questions or concerns? No  Items Reviewed: Did the pt receive and understand the discharge instructions provided? Yes  Medications obtained and verified? Yes  Other? No  Any new allergies since your discharge? No  Dietary orders reviewed? Yes Do you have support at home? Yes   Home Care and Equipment/Supplies: Were home health services ordered? no If so, what is the name of the agency? N/A  Has the agency set up a time to come to the patient's home? not applicable Were any new equipment or medical supplies ordered?  No What is the name of the medical supply agency? N/A Were you able to get the supplies/equipment? no Do you have any questions related to the use of the equipment or supplies? No  Functional Questionnaire: (I = Independent and D = Dependent) ADLs: I  Bathing/Dressing- I  Meal Prep- I  Eating- I  Maintaining continence- I  Transferring/Ambulation- I  Managing Meds- I  Follow up appointments reviewed:  PCP Hospital f/u appt confirmed? Yes  Scheduled to see Dr. Glori Bickers on 09/02/21 @ 0930. [facility] f/u appt confirmed? No  Scheduled to see Dr. Curt Bears ; pt states they are going to call her back with an appnt time and date Are transportation arrangements needed? No  If their condition worsens, is the pt aware to call PCP or go to the Emergency Dept.? Yes Was the patient provided with contact information for the PCP's office or ED? Yes Was to pt encouraged to call back with questions or concerns? Yes  SDOH assessments and interventions completed:   No  Care Coordination Interventions Activated:  No   Care Coordination Interventions:   N/A     Encounter Outcome:  Pt. Visit Completed    Emelia Loron RN,  BSN [place] [phone] [email or web address]

## 2021-08-27 LAB — CULTURE, BLOOD (ROUTINE X 2)
Culture: NO GROWTH
Culture: NO GROWTH
Special Requests: ADEQUATE
Special Requests: ADEQUATE

## 2021-08-31 ENCOUNTER — Encounter (HOSPITAL_COMMUNITY): Payer: Self-pay | Admitting: Nurse Practitioner

## 2021-08-31 ENCOUNTER — Ambulatory Visit (HOSPITAL_COMMUNITY)
Admission: RE | Admit: 2021-08-31 | Discharge: 2021-08-31 | Disposition: A | Discharge status: Home / Self Care | Payer: Medicare HMO | Source: Ambulatory Visit | Attending: Nurse Practitioner | Admitting: Nurse Practitioner

## 2021-08-31 VITALS — BP 148/70 | HR 80 | Ht 66.0 in | Wt 149.2 lb

## 2021-08-31 DIAGNOSIS — R9431 Abnormal electrocardiogram [ECG] [EKG]: Secondary | ICD-10-CM | POA: Insufficient documentation

## 2021-08-31 DIAGNOSIS — Z7901 Long term (current) use of anticoagulants: Secondary | ICD-10-CM | POA: Diagnosis not present

## 2021-08-31 DIAGNOSIS — Z79899 Other long term (current) drug therapy: Secondary | ICD-10-CM | POA: Diagnosis not present

## 2021-08-31 DIAGNOSIS — I10 Essential (primary) hypertension: Secondary | ICD-10-CM | POA: Insufficient documentation

## 2021-08-31 DIAGNOSIS — D6869 Other thrombophilia: Secondary | ICD-10-CM

## 2021-08-31 DIAGNOSIS — E039 Hypothyroidism, unspecified: Secondary | ICD-10-CM | POA: Diagnosis not present

## 2021-08-31 DIAGNOSIS — I2510 Atherosclerotic heart disease of native coronary artery without angina pectoris: Secondary | ICD-10-CM | POA: Diagnosis not present

## 2021-08-31 DIAGNOSIS — I4891 Unspecified atrial fibrillation: Secondary | ICD-10-CM | POA: Diagnosis not present

## 2021-08-31 DIAGNOSIS — I444 Left anterior fascicular block: Secondary | ICD-10-CM | POA: Diagnosis not present

## 2021-08-31 NOTE — Progress Notes (Signed)
Primary Care Physician: Tower, Wynelle Fanny, MD Referring Physician: Dr. Elberta Fortis Denise Grant is a 66 y.o. female with a h/o CAD, hypertension, hypothyroidism, Chiari malformation, degenerative disc disease, atrial fibrillation on Xarelto and metoprolol presented to the ED with chief complaints of shortness of breath, palpitations and lightheadedness.  Patient had taken 12.5 mg of metoprolol and 25 mg of flecainide without any improvement so she presented in the ED.  She was found to have A-fib with RVR on arrival with v rates in the 170's.   Valsalva maneuvers were attempted but were unsuccessful.  Patient was given IV Cardizem without any improvement.  Synchronized cardioversion was attempted  three times without success.  On-call cardiologist was consulted who recommended chemical cardioversion with amiodarone.  Patient was admitted in ICU and remained in ICU and was managed with amiodarone infusion.  Heart rate was controlled and she converted to normal sinus rhythm.  Patient was cleared from cardiology to be discharged.  Cardiology advised to take amiodarone 200 mg twice daily for 1 week followed by 200 mg daily.  Follow-up with the EP physician as an outpatient.  Patient felt better and wanted to be discharged.  She is now in the afib clinic for f/u. EKG shows SR.  She remains on amiodarone 200 mg daily. She is tentatively scheduled for ablation 10/24. She has had a few episodes of afib but lasting only around 10 mins. She feels anxious on the drug, unfortunately, do not have many options to maintain SR until we can get her to ablation. She did not tolerate flecainide in  the past.  She lowered the dose of amio 2 days ago to 200 mg daily.    Today, she denies symptoms of palpitations, chest pain, shortness of breath, orthopnea, PND, lower extremity edema, dizziness, presyncope, syncope, or neurologic sequela. The patient is tolerating medications without difficulties and is otherwise without  complaint today.   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.0)    Osteopenia    Seborrheic dermatitis, unspecified    Vaginal cyst    stable   Past Surgical History:  Procedure Laterality Date   AIR/FLUID EXCHANGE Right 07/25/2018   Procedure: Air/Fluid Exchange;  Surgeon: Jalene Mullet, MD;  Location: [place];  Service: Ophthalmology;  Laterality: Right;   bone spur removal  10/06   shoulder   CATARACT EXTRACTION, BILATERAL Bilateral 2016   CERVICAL [place] SURGERY  2005   Chiari Malformation surgery  6/03   EXCISION [facility]  2/04   GAS INSERTION Right 07/25/2018   Procedure: Insertion Of Gas SF6;  Surgeon: Jalene Mullet, MD;  Location: [place];  Service: Ophthalmology;  Laterality: Right;   LUMBAR DISC SURGERY     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 twice daily for 1 week followed  by 200 mg p.o. daily. 60 tablet 0   DENTA 5000 PLUS 1.1 % CREA dental cream Take 1 application by mouth at bedtime.      levothyroxine (SYNTHROID) 50 MCG tablet TAKE 1 TABLET BY MOUTH EVERY DAY BEFORE BREAKFAST 90 tablet 0   LORazepam (ATIVAN) 1 MG tablet TAKE A HALF TABLET BY MOUTH AT BEDTIME AS NEEDED 15 tablet 3   mometasone (ELOCON) 0.1 % ointment Apply topically daily. Apply to itchy areas on the back bid prn 45 g 1   predniSONE 2 MG TBEC  Take 2 mg by mouth 2 (two) times daily with a meal.     XARELTO 20 MG TABS tablet TAKE 1 TABLET BY MOUTH DAILY WITH SUPPER. 90 tablet 2   No current facility-administered medications for this encounter.    Allergies  Allergen Reactions   Fentanyl Other (See Comments)    Seizure    Buspar [Buspirone]     Felt funny   Calcium     REACTION: cannot tolerate any calcium supplement - GI sympotms   Fluticasone Propionate     REACTION: headache/irritation   Hctz [Hydrochlorothiazide] Other (See Comments)    Eye redness and discomfort/ slt blurred vision    Paroxetine     REACTION: abdominal pain and constipation   Zoloft [Sertraline]     Diarrhea    Hydroxychloroquine Itching and Rash    Social History   Socioeconomic History   Marital status: Widowed    Spouse name: Not on file   Number of children: Not on file   Years of education: Not on file   Highest education level: Not on file  Occupational History   Not on file  Tobacco Use   Smoking status: Former    Types: Cigarettes    Quit date: 01/17/2001    Years since quitting: 20.6    Passive exposure: Past   Smokeless tobacco: Never   Tobacco comments:    Former smoker 01/13/2021  Substance and Sexual Activity   Alcohol use: No    Alcohol/week: 0.0 standard drinks of alcohol   Drug use: No   Sexual activity: Not on file  Other Topics Concern   Not on file  Social History Narrative   Walks daily for exercise      Married--husband diagnosed with stomach cancer (5/11)   Lost her husband 4/12 to cancer    Social Determinants of Health   Financial Resource Strain: Not on file  Food Insecurity: Not on file  Transportation Needs: Not on file  Physical Activity: Not on file  Stress: Not on file  Social Connections: Not on file  Intimate Partner Violence: Not on file    Family History  Problem Relation Age of Onset   Melanoma Sister    Lung cancer Father    Coronary artery disease Mother    Heart failure Mother     Hypertension Mother    Anxiety disorder Son    Lung cancer Brother        + smoker   Breast cancer Neg Hx     ROS- All systems are reviewed and negative except as per the HPI above  Physical Exam: There were no vitals filed for this visit. Wt Readings from Last 3 Encounters:  08/24/21 68.9 kg  08/16/21 69 kg  07/14/21 69.1 kg    Labs: Lab Results  Component Value Date   NA 137 08/23/2021   K 4.2 08/23/2021   CL 107 08/23/2021   CO2 24  08/23/2021   GLUCOSE 83 08/23/2021   BUN 15 08/23/2021   CREATININE 0.73 08/23/2021   CALCIUM 9.0 08/23/2021   PHOS 4.4 08/22/2021   MG 2.3 08/24/2021   Lab Results  Component Value Date   INR 1.8 (H) 08/22/2021   Lab Results  Component Value Date   CHOL 155 08/24/2020   HDL 54.70 08/24/2020   LDLCALC 83 08/24/2020   TRIG 85.0 08/24/2020     GEN- The patient is well appearing, alert and oriented x 3 today.   Head- normocephalic, atraumatic Eyes-  Sclera clear, conjunctiva pink Ears- hearing intact Oropharynx- clear Neck- supple, no JVP Lymph- no cervical lymphadenopathy Lungs- Clear to ausculation bilaterally, normal work of breathing Heart- Regular rate and rhythm, no murmurs, rubs or gallops, PMI not laterally displaced GI- soft, NT, ND, + BS Extremities- no clubbing, cyanosis, or edema MS- no significant deformity or atrophy Skin- no rash or lesion Psych- euthymic mood, full affect Neuro- strength and sensation are intact  EKG-Vent. rate 80 BPM PR interval 148 ms QRS duration 80 ms QT/QTcB 394/454 ms P-R-T axes 75 -60 69 Normal sinus rhythm Right atrial enlargement Low voltage QRS Left anterior fascicular block Inferior infarct , age undetermined Cannot rule out Anterior infarct , age undetermined Abnormal ECG When compared with ECG of 22-Aug-2021 05:34, PREVIOUS ECG IS PRESENT Confirmed by Dixie Dials ([id number]) on 08/31/2021 9:11:33 AM   Assessment and Plan:  1. Afib Recently loaded on amiodarone during  hospital stay for afib refractory to BB/flecainide and cardioversion x 3. She converted in the hospital with amiodrone. She will continue on amiodrone 200 mg daily as a bridge to ablation. BB stopped in the hospital for bradycardia.  Ablation with Dr. Curt Bears scheduled per Maude Leriche,  RN, for 10/24  2. HTN Amlodipine stopped in the hospital for soft BP's Mildly elevated in the office today She will record and take home BP readings to the PCP on Thursday If climbing would be appropriate to restart amlodipine   3. CHA2DS2VASc  score of 3 Continue xarelto 20 mg daily   F/u with Dr. Curt Bears as scheduled   Geroge Baseman. Sharvil Hoey, [facility] [address] [place], [place] [address] [phone]

## 2021-09-01 ENCOUNTER — Telehealth: Payer: Self-pay | Admitting: Family Medicine

## 2021-09-01 DIAGNOSIS — E039 Hypothyroidism, unspecified: Secondary | ICD-10-CM

## 2021-09-01 DIAGNOSIS — I10 Essential (primary) hypertension: Secondary | ICD-10-CM

## 2021-09-01 NOTE — Telephone Encounter (Signed)
-----   Message from Ellamae Sia sent at 08/17/2021  3:56 PM EDT ----- Regarding: Lab orders for Thursday, 8.17.23 Patient is scheduled for CPX labs, please order future labs, Thanks , Karna Christmas

## 2021-09-02 ENCOUNTER — Other Ambulatory Visit (INDEPENDENT_AMBULATORY_CARE_PROVIDER_SITE_OTHER): Payer: Medicare HMO

## 2021-09-02 ENCOUNTER — Ambulatory Visit (INDEPENDENT_AMBULATORY_CARE_PROVIDER_SITE_OTHER): Payer: Medicare HMO | Admitting: Family Medicine

## 2021-09-02 ENCOUNTER — Encounter: Payer: Self-pay | Admitting: Family Medicine

## 2021-09-02 VITALS — BP 124/80 | HR 83 | Ht 66.0 in | Wt 148.8 lb

## 2021-09-02 DIAGNOSIS — I480 Paroxysmal atrial fibrillation: Secondary | ICD-10-CM

## 2021-09-02 DIAGNOSIS — M25561 Pain in right knee: Secondary | ICD-10-CM

## 2021-09-02 DIAGNOSIS — M25461 Effusion, right knee: Secondary | ICD-10-CM

## 2021-09-02 DIAGNOSIS — E039 Hypothyroidism, unspecified: Secondary | ICD-10-CM

## 2021-09-02 DIAGNOSIS — R936 Abnormal findings on diagnostic imaging of limbs: Secondary | ICD-10-CM | POA: Diagnosis not present

## 2021-09-02 DIAGNOSIS — I10 Essential (primary) hypertension: Secondary | ICD-10-CM

## 2021-09-02 LAB — LIPID PANEL
Cholesterol: 184 mg/dL (ref 0–200)
HDL: 65 mg/dL (ref >39.00)
LDL Cholesterol: 104 mg/dL — ABNORMAL HIGH (ref 0–99)
NonHDL: 119.45
Total CHOL/HDL Ratio: 3
Triglycerides: 76 mg/dL (ref 0.0–149.0)
VLDL: 15.2 mg/dL (ref 0.0–40.0)

## 2021-09-02 LAB — COMPREHENSIVE METABOLIC PANEL
ALT: 18 U/L (ref 0–35)
AST: 19 U/L (ref 0–37)
Albumin: 3.9 g/dL (ref 3.5–5.2)
Alkaline Phosphatase: 61 U/L (ref 39–117)
BUN: 9 mg/dL (ref 6–23)
CO2: 26 mEq/L (ref 19–32)
Calcium: 9.1 mg/dL (ref 8.4–10.5)
Chloride: 105 mEq/L (ref 96–112)
Creatinine, Ser: 0.92 mg/dL (ref 0.40–1.20)
GFR: 64.44 mL/min (ref >60.00)
Glucose, Bld: 86 mg/dL (ref 70–99)
Potassium: 4.1 mEq/L (ref 3.5–5.1)
Sodium: 139 mEq/L (ref 135–145)
Total Bilirubin: 0.7 mg/dL (ref 0.2–1.2)
Total Protein: 6.3 g/dL (ref 6.0–8.3)

## 2021-09-02 LAB — CBC WITH DIFFERENTIAL/PLATELET
Basophils Absolute: 0 10*3/uL (ref 0.0–0.1)
Basophils Relative: 0.5 % (ref 0.0–3.0)
Eosinophils Absolute: 0 10*3/uL (ref 0.0–0.7)
Eosinophils Relative: 0.5 % (ref 0.0–5.0)
HCT: 38.5 % (ref 36.0–46.0)
Hemoglobin: 12.7 g/dL (ref 12.0–15.0)
Lymphocytes Relative: 25.1 % (ref 12.0–46.0)
Lymphs Abs: 1 10*3/uL (ref 0.7–4.0)
MCHC: 32.9 g/dL (ref 30.0–36.0)
MCV: 88.4 fl (ref 78.0–100.0)
Monocytes Absolute: 0.3 10*3/uL (ref 0.1–1.0)
Monocytes Relative: 7.7 % (ref 3.0–12.0)
Neutro Abs: 2.8 10*3/uL (ref 1.4–7.7)
Neutrophils Relative %: 66.2 % (ref 43.0–77.0)
Platelets: 211 10*3/uL (ref 150.0–400.0)
RBC: 4.36 Mil/uL (ref 3.87–5.11)
RDW: 15.2 % (ref 11.5–15.5)
WBC: 4.2 10*3/uL (ref 4.0–10.5)

## 2021-09-02 LAB — TSH: TSH: 2.79 u[IU]/mL (ref 0.35–5.50)

## 2021-09-02 NOTE — Progress Notes (Signed)
Subjective:    Patient ID: Denise Grant, female    DOB: February 25, 1955, 66 y.o.   MRN: [id number]  HPI Pt presents for f/u of hospital for a fib  Wt Readings from Last 3 Encounters:  09/02/21 148 lb 12.8 oz (67.5 kg)  08/31/21 149 lb 3.2 oz (67.7 kg)  08/24/21 151 lb 14.4 oz (68.9 kg)   24.02 kg/m  Hosp 8/6 to 08/24/21 for a fib  Admitted with afib RVR Tried to cardiovert-no success Amiodarone infustion done to cardiovert -successful  Did convert to NSR Seen by cardiology  Px amiodraone 200 mg bid for 7d then 200 mg daily with f/u EP provider   Hosp course:  Discharge Diagnoses:  Principal Problem:   Atrial fibrillation with RVR ([place])   PAF: -Converted to sinus rhythm on IV amiodarone -Maintaining sinus rhythm  -Declined amiodarone last evening with reported "skin crawling" and jitteriness, will discontinue  -Previously noted to be intolerant to flecainide  -Transition to oral amiodarone,  maintaining sinus rhythm. -Toprol kept on hold due to bradycardia -Continue PTA Xarelto (CrCl 82) -Follow up with EP   Elevated high sensitivity troponin: -Minimally elevated and flat trending -Not consistent with ACS -Likely supply demand ischemia in the setting of Afib with RVR -Echo pending -Outpatient follow up with primary cardiologist   Hypokalemia: -Repleted    HTN: -Blood pressure 268T mmHg systolic during rounds -Resume PTA Toprol  -Continue to hold PTA amlodipine for now  Stopped amlodipine, flecainide, metoprolol   BP Readings from Last 3 Encounters:  09/02/21 124/80  08/31/21 (!) 148/70  08/24/21 119/65   Pulse Readings from Last 3 Encounters:  09/02/21 83  08/31/21 80  08/24/21 (!) 56   She had visit with cardiology on 8/15 Told to re start amlodipine if bp rises   Bp is good today  At home bp is 120s/70s   Amiodarone makes her tired and a bit anxious  Getting used to it   Planning ablation oct 24th  She wishes she could go earlier   Taking  xarelto   Lab Results  Component Value Date   CREATININE 0.73 08/23/2021   BUN 15 08/23/2021   NA 137 08/23/2021   K 4.2 08/23/2021   CL 107 08/23/2021   CO2 24 08/23/2021   Lab Results  Component Value Date   WBC 8.5 08/24/2021   HGB 13.5 08/24/2021   HCT 40.8 08/24/2021   MCV 86.1 08/24/2021   PLT 331 08/24/2021    Had a shot of steroid in knee prior to all of this   Needs MRI of knee in [place] for f/u of lucency on xray   Never got it scheduled due to unexpected hosp  DG Knee 4 Views W/Patella Right (Accession [id number]) (Order [id number]) Imaging Date: 08/17/2021 Department: [facility] Released By: Ellamae Sia Authorizing: Eugenia Pancoast, FNP   Exam Status  Status  Final [99]   PACS Intelerad Image Link   Show images for DG Knee 4 Views W/Patella Right  Study Result  Narrative & Impression  CLINICAL DATA:  Right knee pain.  No known injury.   EXAM: RIGHT KNEE - COMPLETE 4+ VIEW   COMPARISON:  No recent prior.   FINDINGS: Diffuse osteopenia. Diffuse tricompartment degenerative change. A questionable rounded lucency is noted in the proximal right tibial metaphysis on lateral view. Further evaluation with MRI of the right knee suggested. No evidence of fracture or dislocation. Right knee joint effusion noted.   IMPRESSION:  1. Diffuse osteopenia and tricompartment degenerative change. Right knee joint effusion noted.   2. A questionable rounded lucency is noted the proximal right tibial metaphysis on lateral view. Further evaluation with MRI of the right knee suggested.     Patient Active Problem List   Diagnosis Date Noted   Atrial fibrillation with RVR ([place]) 08/22/2021   Abnormal x-ray of knee 08/17/2021   Effusion of right knee 08/17/2021   Osteopenia of right lower leg 08/17/2021   Acute pain of right knee 08/16/2021   Pain and swelling of right lower leg 08/16/2021   Posterior knee pain, right 08/16/2021    Rheumatoid factor positive 08/28/2020   Myalgia 08/25/2020   Secondary hypercoagulable state ([place]) 07/05/2019   Lumbar disc disease    Bilateral bunions    Paroxysmal atrial fibrillation ([place]) 09/27/2018   Fatigue 09/27/2018   Essential hypertension 01/01/2015   Hypothyroidism 06/25/2014   Migraine with vertigo 05/21/2014   History of Chiari malformation 05/21/2014   Atrophic vaginitis 05/01/2013   INSOMNIA 01/27/2010   HEADACHE 03/31/2008   Osteopenia 09/18/2007   Generalized anxiety disorder 05/05/2006   MITRAL VALVE PROLAPSE 05/05/2006   ALLERGIC RHINITIS 05/05/2006   FIBROCYSTIC BREAST DISEASE 05/05/2006   SEBORRHEIC DERMATITIS 05/05/2006   KERATOSIS, ACTINIC 05/05/2006   NECK PAIN, CHRONIC 05/05/2006   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.0)    Osteopenia    Seborrheic dermatitis, unspecified    Vaginal cyst    stable   Past Surgical History:  Procedure Laterality Date   AIR/FLUID EXCHANGE Right 07/25/2018   Procedure: Air/Fluid Exchange;  Surgeon: Jalene Mullet, MD;  Location: [facility];  Service: Ophthalmology;  Laterality: Right;   bone spur removal  10/06   shoulder   CATARACT EXTRACTION, BILATERAL Bilateral 2016   CERVICAL [place] SURGERY  2005   Chiari Malformation surgery  6/03   EXCISION MORTON'S NEUROMA  2/04   GAS INSERTION Right 07/25/2018   Procedure: Insertion Of Gas SF6;  Surgeon: Jalene Mullet, MD;  Location: [facility];  Service: Ophthalmology;  Laterality: Right;   LUMBAR DISC SURGERY     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   Procedure: REPAIR OF COMPLEX TRACTION RETINAL DETACHMENT - 25GA VITRECTOMY, ENDOLASER, DRAINAGE OF SUBRETINAL FLUID;  Surgeon: Jalene Mullet,  MD;  Location: [facility];  Service: Ophthalmology;  Laterality: Right;   TUBAL LIGATION     Social History   Tobacco Use   Smoking status: Former    Types: Cigarettes    Quit date: 01/17/2001    Years since quitting: 20.6    Passive exposure: Past   Smokeless tobacco: Never   Tobacco comments:    Former smoker 01/13/2021  Substance Use Topics   Alcohol use: No    Alcohol/week: 0.0 standard drinks of alcohol   Drug use: No   Family History  Problem Relation Age of Onset   Melanoma Sister    Lung cancer Father    Coronary artery disease Mother    Heart failure Mother    Hypertension Mother    Anxiety disorder Son    Lung cancer Brother        + smoker   Breast  cancer Neg Hx    Allergies  Allergen Reactions   Fentanyl Other (See Comments)    Seizure    Buspar [Buspirone]     Felt funny   Calcium     REACTION: cannot tolerate any calcium supplement - GI sympotms   Fluticasone Propionate     REACTION: headache/irritation   Hctz [Hydrochlorothiazide] Other (See Comments)    Eye redness and discomfort/ slt blurred vision    Paroxetine     REACTION: abdominal pain and constipation   Zoloft [Sertraline]     Diarrhea    Hydroxychloroquine Itching and Rash   Current Outpatient Medications on File Prior to Visit  Medication Sig Dispense Refill   amiodarone (PACERONE) 200 MG tablet Advised to take amiodarone 200 mg p.o. twice daily for 1 week followed by 200 mg p.o. daily. 60 tablet 0   DENTA 5000 PLUS 1.1 % CREA dental cream Take 1 application by mouth at bedtime.      levothyroxine (SYNTHROID) 50 MCG tablet TAKE 1 TABLET BY MOUTH EVERY DAY BEFORE BREAKFAST 90 tablet 0   LORazepam (ATIVAN) 1 MG tablet TAKE A HALF TABLET BY MOUTH AT BEDTIME AS NEEDED 15 tablet 3   mometasone (ELOCON) 0.1 % ointment Apply topically daily. Apply to itchy areas on the back bid prn 45 g 1   predniSONE 2 MG TBEC Take 2 mg by mouth 2 (two) times daily with a meal.     XARELTO 20 MG TABS tablet TAKE 1  TABLET BY MOUTH DAILY WITH SUPPER. 90 tablet 2   No current facility-administered medications on file prior to visit.     Review of Systems  Constitutional:  Negative for activity change, appetite change, fatigue, fever and unexpected weight change.  HENT:  Negative for congestion, ear pain, rhinorrhea, sinus pressure and sore throat.   Eyes:  Negative for pain, redness and visual disturbance.  Respiratory:  Negative for cough, shortness of breath and wheezing.   Cardiovascular:  Negative for chest pain and palpitations.  Gastrointestinal:  Negative for abdominal pain, blood in stool, constipation and diarrhea.  Endocrine: Negative for polydipsia and polyuria.  Genitourinary:  Negative for dysuria, frequency and urgency.  Musculoskeletal:  Positive for arthralgias. Negative for back pain and myalgias.  Skin:  Negative for pallor and rash.  Allergic/Immunologic: Negative for environmental allergies.  Neurological:  Negative for dizziness, syncope and headaches.  Hematological:  Negative for adenopathy. Does not bruise/bleed easily.  Psychiatric/Behavioral:  Negative for decreased concentration and dysphoric mood. The patient is not nervous/anxious.        Objective:   Physical Exam Constitutional:      General: She is not in acute distress.    Appearance: Normal appearance. She is well-developed and normal weight. She is not ill-appearing or diaphoretic.  HENT:     Head: Normocephalic and atraumatic.  Eyes:     Conjunctiva/sclera: Conjunctivae normal.     Pupils: Pupils are equal, round, and reactive to light.  Neck:     Thyroid: No thyromegaly.     Vascular: No carotid bruit or JVD.  Cardiovascular:     Rate and Rhythm: Normal rate and regular rhythm.     Pulses: Normal pulses.     Heart sounds: Normal heart sounds.     No gallop.  Pulmonary:     Effort: Pulmonary effort is normal. No respiratory distress.     Breath sounds: Normal breath sounds. No wheezing or rales.   Abdominal:     General: There  is no distension or abdominal bruit.     Palpations: Abdomen is soft.  Musculoskeletal:     Cervical back: Normal range of motion and neck supple.     Right lower leg: No edema.     Left lower leg: No edema.     Comments: L knee- swelling is improved   Lymphadenopathy:     Cervical: No cervical adenopathy.  Skin:    General: Skin is warm and dry.     Coloration: Skin is not jaundiced or pale.     Findings: No rash.     Comments: tanned  Neurological:     Mental Status: She is alert.     Coordination: Coordination normal.     Deep Tendon Reflexes: Reflexes are normal and symmetric. Reflexes normal.  Psychiatric:        Mood and Affect: Mood normal.           Assessment & Plan:   Problem List Items Addressed This Visit       Cardiovascular and Mediastinum   Paroxysmal atrial fibrillation ([place]) - Primary    Recent hospitalization, was cardioverted with amiodarone (after failing electric times 3) and now is stable (off bp med as well) Reviewed hospital records, lab results and studies in detail  utd cardiology care Planning ablation in October if all goes well She is adjusting to amiodarone now- makes her jittery and tired         Endocrine   Hypothyroidism    Lab Results  Component Value Date   TSH 4.364 08/22/2021  Re checked today for upcoming annual exam  Aware amiodarone can affect it         Other   Abnormal x-ray of knee    Osteopenia and tri comparmtne deg change as well as rounded lucency of R tibial metaphysis   MRI was ordered  She is ready to get that scheduled   Knee pain is improved after steroid shot at rheum office   We will reach out to ref coordinator to schedule

## 2021-09-02 NOTE — Assessment & Plan Note (Signed)
Recent hospitalization, was cardioverted with amiodarone (after failing electric times 3) and now is stable (off bp med as well) Reviewed hospital records, lab results and studies in detail  utd cardiology care Planning ablation in October if all goes well She is adjusting to amiodarone now- makes her jittery and tired

## 2021-09-02 NOTE — Assessment & Plan Note (Signed)
Lab Results  Component Value Date   TSH 4.364 08/22/2021   Re checked today for upcoming annual exam  Aware amiodarone can affect it

## 2021-09-02 NOTE — Patient Instructions (Addendum)
I will reach out to our referral coordinator about the knee MRI  If you don't get a call in the next week or so please let us know  Watch your blood pressure  Continue current medicines  Follow up with cardiology as planned   Please keep [place] posted  If anxiety worsens let us know

## 2021-09-02 NOTE — Assessment & Plan Note (Signed)
Osteopenia and tri comparmtne deg change as well as rounded lucency of R tibial metaphysis   MRI was ordered  She is ready to get that scheduled   Knee pain is improved after steroid shot at rheum office   We will reach out to ref coordinator to schedule

## 2021-09-06 ENCOUNTER — Encounter: Payer: Self-pay | Admitting: Family Medicine

## 2021-09-06 ENCOUNTER — Ambulatory Visit (INDEPENDENT_AMBULATORY_CARE_PROVIDER_SITE_OTHER): Payer: Medicare HMO | Admitting: Family Medicine

## 2021-09-06 VITALS — BP 122/74 | HR 79 | Ht 65.5 in | Wt 148.0 lb

## 2021-09-06 DIAGNOSIS — Z0000 Encounter for general adult medical examination without abnormal findings: Secondary | ICD-10-CM | POA: Diagnosis not present

## 2021-09-06 DIAGNOSIS — H59811 Chorioretinal scars after surgery for detachment, right eye: Secondary | ICD-10-CM | POA: Diagnosis not present

## 2021-09-06 DIAGNOSIS — E039 Hypothyroidism, unspecified: Secondary | ICD-10-CM

## 2021-09-06 DIAGNOSIS — M8580 Other specified disorders of bone density and structure, unspecified site: Secondary | ICD-10-CM

## 2021-09-06 DIAGNOSIS — I10 Essential (primary) hypertension: Secondary | ICD-10-CM | POA: Diagnosis not present

## 2021-09-06 DIAGNOSIS — R69 Illness, unspecified: Secondary | ICD-10-CM | POA: Diagnosis not present

## 2021-09-06 DIAGNOSIS — H338 Other retinal detachments: Secondary | ICD-10-CM | POA: Diagnosis not present

## 2021-09-06 DIAGNOSIS — F411 Generalized anxiety disorder: Secondary | ICD-10-CM

## 2021-09-06 DIAGNOSIS — H35413 Lattice degeneration of retina, bilateral: Secondary | ICD-10-CM | POA: Diagnosis not present

## 2021-09-06 DIAGNOSIS — M353 Polymyalgia rheumatica: Secondary | ICD-10-CM

## 2021-09-06 DIAGNOSIS — Z1231 Encounter for screening mammogram for malignant neoplasm of breast: Secondary | ICD-10-CM | POA: Diagnosis not present

## 2021-09-06 DIAGNOSIS — I480 Paroxysmal atrial fibrillation: Secondary | ICD-10-CM

## 2021-09-06 DIAGNOSIS — H31092 Other chorioretinal scars, left eye: Secondary | ICD-10-CM | POA: Diagnosis not present

## 2021-09-06 DIAGNOSIS — D6869 Other thrombophilia: Secondary | ICD-10-CM | POA: Diagnosis not present

## 2021-09-06 DIAGNOSIS — H35373 Puckering of macula, bilateral: Secondary | ICD-10-CM | POA: Diagnosis not present

## 2021-09-06 MED ORDER — LEVOTHYROXINE SODIUM 50 MCG PO TABS: ORAL_TABLET | ORAL | 3 refills | Status: DC

## 2021-09-06 MED ORDER — LORAZEPAM 1 MG PO TABS
ORAL_TABLET | ORAL | 3 refills | Status: DC
Start: 2021-09-06 — End: 2021-09-30

## 2021-09-06 MED ORDER — HYDROXYZINE HCL 10 MG PO TABS: 10.0000 mg | ORAL_TABLET | Freq: Three times a day (TID) | ORAL | 0 refills | Status: DC | PRN

## 2021-09-06 NOTE — Assessment & Plan Note (Signed)
Continues xarelto

## 2021-09-06 NOTE — Assessment & Plan Note (Signed)
Amiodarone makes anxiety worse  Takes lorazepam for sleep prn Sent hydroxyzine to try with caution of sedation

## 2021-09-06 NOTE — Assessment & Plan Note (Signed)
Reviewed health habits including diet and exercise and skin cancer prevention Reviewed appropriate screening tests for age  Also reviewed health mt list, fam hx and immunization status , as well as social and family history   See HPI Labs reviewed Plans flu shot in the fall Declines shingrix  Declines any pna vaccines  Mammogram ordered  cologuard utd/(declines colonoscopy) Declines dexa, no falls or fractures  utd derm care and screening for skin cancer PHQ is 0

## 2021-09-06 NOTE — Patient Instructions (Addendum)
You can try hydroxyzine for anxiety 1/2 to 1 pill Be caution of sedation  Take care of yourself   Get a flu shot in the fall   I placed an order for your mammogram at [facility]  Please call the location of your choice from the menu below to schedule your Mammogram and/or Bone Density appointment.    [facility]                      Phone:  [phone], [place] [address]                                                             Services: Traditional and 3D Mammogram, [facility]                 Phone: [phone] [address], [place] [address]    Service: Bone Density ONLY   *this site does NOT perform mammograms  [facility]                        Phone:  [phone] [address], [place] [address]                                            Services:  3D Mammogram and [place] at [facility]   Phone:  [phone]   [place],  [address]                                            Services: 3D Mammogram and [place]  [place] at [facility])  Phone:  [phone]   [address]  [place], [place] [address]                                              Services:  3D Mammogram and Bone Density  

## 2021-09-06 NOTE — Assessment & Plan Note (Signed)
Stable Taking amiodarone Planning ablation in oct

## 2021-09-06 NOTE — Assessment & Plan Note (Signed)
Sees rheumatology On low dose prednisone

## 2021-09-06 NOTE — Assessment & Plan Note (Signed)
Declines another dexa Chronic prednisone   Takes supplemental D Exercises when able  No falls or fx

## 2021-09-06 NOTE — Assessment & Plan Note (Signed)
Mammogram ordered  Pt will call to schedule [facility] self breast exams

## 2021-09-06 NOTE — Progress Notes (Signed)
Subjective:    Patient ID: Denise Grant, female    DOB: 07/06/1955, 66 y.o.   MRN: [id number]  HPI Here for health maintenance exam and to review chronic medical problems     Wt Readings from Last 3 Encounters:  09/06/21 148 lb (67.1 kg)  09/02/21 148 lb 12.8 oz (67.5 kg)  08/31/21 149 lb 3.2 oz (67.7 kg)    24.25 kg/m  Off exercise due to a fib - looking forward to re start walking when a fib improves   Diet is ok  Tries to control fat and sugar  Eating more veggies     Immunization History  Administered Date(s) Administered   Influenza Inj Mdck Quad Pf 10/14/2017   Influenza Split 11/24/2010   Influenza, High Dose Seasonal PF 10/10/2019   Influenza, Seasonal, Injecte, Preservative Fre 10/21/2015   Influenza,inj,Quad PF,6+ Mos 10/27/2016, 09/27/2018   Influenza-Unspecified 10/18/2013, 11/01/2014, 11/05/2020   Moderna Sars-Covid-2 Vaccination 04/06/2019, 05/04/2019   Td 04/17/2002   Tdap 05/11/2012    Health Maintenance Due  Topic Date Due   Hepatitis C Screening  Never done   Zoster Vaccines- Shingrix (1 of 2) Never done   Pneumonia Vaccine 69+ Years old (1 - PCV) Never done   COVID-19 Vaccine (3 - Moderna risk series) 06/01/2019   INFLUENZA VACCINE  08/17/2021   Shingrix: declines   Pna vaccine -declines   Mammogram 09/2020 - due next month  Self breast exam: no lumps   Cologuard 09/2019 negative  Declines colonsocopy    Dexa 05/2012- declines  No falls  Fractures: none  Supplements : taking vitamin D Exercise : walking when she is able    Fam h/o melanoma in sister and lung cancer in brother  Just had derm visit jan 1  Had mole removed and it was pre cancerous   Mood  GAD Did not tolerate sertraline in the past  Counseling -did in the past  Does some deep breathing  Has lorazepam to take 0.5 mg for sleep prn      09/06/2021    8:27 AM 09/02/2021    9:36 AM 08/31/2020    9:47 AM 08/30/2019   10:15 AM 08/30/2019   10:14 AM  Depression  screen PHQ 2/9  Decreased Interest 0 0 0 0 0  Down, Depressed, Hopeless 0 0 0 1 0  PHQ - 2 Score 0 0 0 1 0  Altered sleeping    0   Tired, decreased energy    0   Change in appetite    0   Feeling bad or failure about yourself     0   Trouble concentrating    0   Moving slowly or fidgety/restless    0   Suicidal thoughts    0   PHQ-9 Score    1    Still anxious Thinks amiodarone makes that worse      HTN bp is stable today  No cp or palpitations or headaches or edema  No side effects to medicines  BP Readings from Last 3 Encounters:  09/02/21 124/80  08/31/21 (!) 148/70  08/24/21 119/65    Pulse Readings from Last 3 Encounters:  09/02/21 83  08/31/21 80  08/24/21 (!) 56    Stopped medications with last hospitalization   Lab Results  Component Value Date   CREATININE 0.92 09/02/2021   BUN 9 09/02/2021   NA 139 09/02/2021   K 4.1 09/02/2021   CL 105 09/02/2021   CO2  26 09/02/2021      A fib Amiodarone Xarelto Ablation planned for October   Hypothyroidism  Pt has no clinical changes No change in energy level/ hair or skin/ edema and no tremor Lab Results  Component Value Date   TSH 2.79 09/02/2021     Levothyroxine 50 mcg daily    Cholesterol Lab Results  Component Value Date   CHOL 184 09/02/2021   CHOL 155 08/24/2020   CHOL 158 08/23/2019   Lab Results  Component Value Date   HDL 65.00 09/02/2021   HDL 54.70 08/24/2020   HDL 55.00 08/23/2019   Lab Results  Component Value Date   LDLCALC 104 (H) 09/02/2021   LDLCALC 83 08/24/2020   LDLCALC 91 08/23/2019   Lab Results  Component Value Date   TRIG 76.0 09/02/2021   TRIG 85.0 08/24/2020   TRIG 62.0 08/23/2019   Lab Results  Component Value Date   CHOLHDL 3 09/02/2021   CHOLHDL 3 08/24/2020   CHOLHDL 3 08/23/2019   No results found for: "LDLDIRECT"  Does not eat fried food or red meat or fatty foods     Lab Results  Component Value Date   ALT 18 09/02/2021   AST 19  09/02/2021   ALKPHOS 61 09/02/2021   BILITOT 0.7 09/02/2021    Other labs Lab Results  Component Value Date   WBC 4.2 09/02/2021   HGB 12.7 09/02/2021   HCT 38.5 09/02/2021   MCV 88.4 09/02/2021   PLT 211.0 09/02/2021   Patient Active Problem List   Diagnosis Date Noted   Encounter for screening mammogram for breast cancer 09/06/2021   PMR (polymyalgia rheumatica) ([place]) 09/06/2021   Atrial fibrillation with RVR ([place]) 08/22/2021   Abnormal x-ray of knee 08/17/2021   Effusion of right knee 08/17/2021   Osteopenia of right lower leg 08/17/2021   Acute pain of right knee 08/16/2021   Pain and swelling of right lower leg 08/16/2021   Posterior knee pain, right 08/16/2021   Rheumatoid factor positive 08/28/2020   Myalgia 08/25/2020   Secondary hypercoagulable state ([place]) 07/05/2019   Lumbar disc disease    Bilateral bunions    Paroxysmal atrial fibrillation ([place]) 09/27/2018   Essential hypertension 01/01/2015   Hypothyroidism 06/25/2014   Migraine with vertigo 05/21/2014   History of Chiari malformation 05/21/2014   Atrophic vaginitis 05/01/2013   Routine general medical examination at a health care facility 11/18/2010   INSOMNIA 01/27/2010   HEADACHE 03/31/2008   Osteopenia 09/18/2007   Generalized anxiety disorder 05/05/2006   MITRAL VALVE PROLAPSE 05/05/2006   ALLERGIC RHINITIS 05/05/2006   FIBROCYSTIC BREAST DISEASE 05/05/2006   SEBORRHEIC DERMATITIS 05/05/2006   KERATOSIS, ACTINIC 05/05/2006   NECK PAIN, CHRONIC 05/05/2006   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.0)    Osteopenia    Seborrheic dermatitis, unspecified    Vaginal cyst    stable   Past Surgical History:  Procedure Laterality Date    AIR/FLUID EXCHANGE Right 07/25/2018   Procedure: Air/Fluid Exchange;  Surgeon: Jalene Mullet, MD;  Location: [facility];  Service: Ophthalmology;  Laterality: Right;   bone spur removal  10/06   shoulder   CATARACT EXTRACTION, BILATERAL  Bilateral 2016   CERVICAL DISC SURGERY  2005   Chiari Malformation surgery  6/03   EXCISION MORTON'S NEUROMA  2/04   GAS INSERTION Right 07/25/2018   Procedure: Insertion Of Gas SF6;  Surgeon: Jalene Mullet, MD;  Location: [facility];  Service: Ophthalmology;  Laterality: Right;   LUMBAR DISC SURGERY     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   Procedure: REPAIR OF COMPLEX TRACTION RETINAL DETACHMENT - 25GA VITRECTOMY, ENDOLASER, DRAINAGE OF SUBRETINAL FLUID;  Surgeon: Jalene Mullet, MD;  Location: [facility];  Service: Ophthalmology;  Laterality: Right;   TUBAL LIGATION     Social History   Tobacco Use   Smoking status: Former    Types: Cigarettes    Quit date: 01/17/2001    Years since quitting: 20.6    Passive exposure: Past   Smokeless tobacco: Never   Tobacco comments:    Former smoker 01/13/2021  Substance Use Topics   Alcohol use: No    Alcohol/week: 0.0 standard drinks of alcohol   Drug use: No   Family History  Problem Relation Age of Onset   Melanoma Sister    Lung cancer Father    Coronary artery disease Mother    Heart failure Mother    Hypertension Mother    Anxiety disorder Son    Lung cancer Brother        + smoker   Breast cancer Neg Hx    Allergies  Allergen Reactions   Fentanyl Other (See Comments)    Seizure    Buspar [Buspirone]     Felt funny   Calcium     REACTION: cannot tolerate any calcium supplement - GI sympotms   Fluticasone Propionate     REACTION: headache/irritation   Hctz [Hydrochlorothiazide] Other (See Comments)    Eye redness and discomfort/ slt blurred vision    Paroxetine     REACTION: abdominal pain and constipation   Zoloft [Sertraline]     Diarrhea    Hydroxychloroquine  Itching and Rash   Current Outpatient Medications on File Prior to Visit  Medication Sig Dispense Refill   amiodarone (PACERONE) 200 MG tablet Advised to take amiodarone 200 mg p.o. twice daily for 1 week followed by 200 mg p.o. daily. 60 tablet 0   DENTA 5000 PLUS 1.1 % CREA dental cream Take 1 application by mouth at bedtime.      mometasone (ELOCON) 0.1 % ointment Apply topically daily. Apply to itchy areas on the back bid prn 45 g 1   predniSONE 2 MG TBEC Take 2 mg by mouth 2 (two) times daily with a meal.     XARELTO 20 MG TABS tablet TAKE 1 TABLET BY MOUTH DAILY WITH SUPPER. 90 tablet 2   No current facility-administered medications on file prior to visit.    Review of Systems  Constitutional:  Negative for activity change, appetite change, fatigue, fever and unexpected weight change.  HENT:  Negative for congestion, ear pain, rhinorrhea, sinus pressure and sore throat.   Eyes:  Negative for pain, redness and visual disturbance.  Respiratory:  Negative for cough, shortness of breath and wheezing.   Cardiovascular:  Negative for chest pain and palpitations.  Gastrointestinal:  Negative for abdominal pain, blood in stool, constipation and diarrhea.  Endocrine: Negative for polydipsia and polyuria.  Genitourinary:  Negative for dysuria, frequency and urgency.  Musculoskeletal:  Positive for arthralgias and myalgias. Negative for back pain.  Skin:  Negative for pallor and rash.  Allergic/Immunologic: Negative  for environmental allergies.  Neurological:  Negative for dizziness, syncope and headaches.  Hematological:  Negative for adenopathy. Does not bruise/bleed easily.  Psychiatric/Behavioral:  Negative for decreased concentration and dysphoric mood. The patient is not nervous/anxious.        Objective:   Physical Exam Constitutional:      General: She is not in acute distress.    Appearance: Normal appearance. She is well-developed and normal weight. She is not ill-appearing or  diaphoretic.  HENT:     Head: Normocephalic and atraumatic.     Right Ear: Tympanic membrane, ear canal and external ear normal.     Left Ear: Tympanic membrane, ear canal and external ear normal.     Nose: Nose normal. No congestion.     Mouth/Throat:     Mouth: Mucous membranes are moist.     Pharynx: Oropharynx is clear. No posterior oropharyngeal erythema.  Eyes:     General: No scleral icterus.    Extraocular Movements: Extraocular movements intact.     Conjunctiva/sclera: Conjunctivae normal.     Pupils: Pupils are equal, round, and reactive to light.  Neck:     Thyroid: No thyromegaly.     Vascular: No carotid bruit or JVD.  Cardiovascular:     Rate and Rhythm: Normal rate and regular rhythm.     Pulses: Normal pulses.     Heart sounds: Normal heart sounds.     No gallop.  Pulmonary:     Effort: Pulmonary effort is normal. No respiratory distress.     Breath sounds: Normal breath sounds. No wheezing.     Comments: Good air exch Chest:     Chest wall: No tenderness.  Abdominal:     General: Bowel sounds are normal. There is no distension or abdominal bruit.     Palpations: Abdomen is soft. There is no mass.     Tenderness: There is no abdominal tenderness.     Hernia: No hernia is present.  Genitourinary:    Comments: Breast exam: No mass, nodules, thickening, tenderness, bulging, retraction, inflamation, nipple discharge or skin changes noted.  No axillary or clavicular LA.     Musculoskeletal:        General: No tenderness. Normal range of motion.     Cervical back: Normal range of motion and neck supple. No rigidity. No muscular tenderness.     Right lower leg: No edema.     Left lower leg: No edema.     Comments: No kyphosis   Lymphadenopathy:     Cervical: No cervical adenopathy.  Skin:    General: Skin is warm and dry.     Coloration: Skin is not pale.     Findings: No erythema or rash.     Comments: Solar lentigines diffusely   Neurological:     Mental  Status: She is alert. Mental status is at baseline.     Cranial Nerves: No cranial nerve deficit.     Motor: No abnormal muscle tone.     Coordination: Coordination normal.     Gait: Gait normal.     Deep Tendon Reflexes: Reflexes are normal and symmetric. Reflexes normal.  Psychiatric:        Mood and Affect: Mood normal.        Cognition and Memory: Cognition and memory normal.           Assessment & Plan:   Problem List Items Addressed This Visit       Cardiovascular and Mediastinum  Essential hypertension    bp in fair control at this time  BP Readings from Last 1 Encounters:  09/06/21 122/74  No changes needed Most recent labs reviewed  Disc lifstyle change with low sodium diet and exercise  No medications currently       Paroxysmal atrial fibrillation (HCC)    Stable Taking amiodarone Planning ablation in oct         Endocrine   Hypothyroidism    Stable Hypothyroidism  Pt has no clinical changes No change in energy level/ hair or skin/ edema and no tremor Lab Results  Component Value Date   TSH 2.79 09/02/2021    Plan to continue levothyroxine 50 mcg daily       Relevant Medications   levothyroxine (SYNTHROID) 50 MCG tablet     Musculoskeletal and Integument   Osteopenia    Declines another dexa Chronic prednisone   Takes supplemental D Exercises when able  No falls or fx        Other   Encounter for screening mammogram for breast cancer    Mammogram ordered  Pt will call to schedule Enc self breast exams      Relevant Orders   MM 3D SCREEN BREAST BILATERAL   Generalized anxiety disorder    Amiodarone makes anxiety worse  Takes lorazepam for sleep prn Sent hydroxyzine to try with caution of sedation       Relevant Medications   hydrOXYzine (ATARAX) 10 MG tablet   LORazepam (ATIVAN) 1 MG tablet   PMR (polymyalgia rheumatica) ([place])    Sees rheumatology On low dose prednisone      Routine general medical examination at a  health care facility - Primary    Reviewed health habits including diet and exercise and skin cancer prevention Reviewed appropriate screening tests for age  Also reviewed health mt list, fam hx and immunization status , as well as social and family history   See HPI Labs reviewed Plans flu shot in the fall Declines shingrix  Declines any pna vaccines  Mammogram ordered  cologuard utd/(declines colonoscopy) Declines dexa, no falls or fractures  utd derm care and screening for skin cancer PHQ is 0      Secondary hypercoagulable state ([place])    Continues xarelto

## 2021-09-06 NOTE — Assessment & Plan Note (Signed)
Stable Hypothyroidism  Pt has no clinical changes No change in energy level/ hair or skin/ edema and no tremor Lab Results  Component Value Date   TSH 2.79 09/02/2021    Plan to continue levothyroxine 50 mcg daily

## 2021-09-06 NOTE — Assessment & Plan Note (Signed)
bp in fair control at this time  BP Readings from Last 1 Encounters:  09/06/21 122/74   No changes needed Most recent labs reviewed  Disc lifstyle change with low sodium diet and exercise  No medications currently

## 2021-09-16 ENCOUNTER — Telehealth (HOSPITAL_COMMUNITY): Payer: Self-pay | Admitting: *Deleted

## 2021-09-16 ENCOUNTER — Other Ambulatory Visit: Payer: Self-pay | Admitting: Family Medicine

## 2021-09-16 ENCOUNTER — Encounter: Payer: Self-pay | Admitting: Family Medicine

## 2021-09-16 ENCOUNTER — Telehealth: Payer: Self-pay | Admitting: Family Medicine

## 2021-09-16 MED ORDER — DILTIAZEM HCL 30 MG PO TABS: ORAL_TABLET | ORAL | 1 refills | Status: AC

## 2021-09-16 MED ORDER — AMIODARONE HCL 200 MG PO TABS: 200.0000 mg | ORAL_TABLET | Freq: Every day | ORAL | 3 refills | Status: DC

## 2021-09-16 NOTE — Telephone Encounter (Signed)
Ativan was sent in on 09/06/21 #15 tab with 3 refills but will still route to PCP so she can see what pt said about synthroid med

## 2021-09-16 NOTE — Telephone Encounter (Signed)
Patient called in stating she is having breakthrough afib intermittently. It will self convert but can last a few hours. Discussed with Adline Peals PA will start cardizem '30mg'$  PRN for breakthrough. Pt also instructed to avoid hydroxyzine as it interacts with amiodarone. Pt states she has not taken any of the hydroxyzine. Pt will call if issues arise prior to ablation.

## 2021-09-16 NOTE — Telephone Encounter (Signed)
Patient called in stating that her cardiologist doesn't want her to take thelevothyroxine (SYNTHROID) 50 MCG tablet, she would like to know if you could call inLORazepam (ATIVAN) 1 MG tablet for her instead?

## 2021-09-16 NOTE — Telephone Encounter (Signed)
Please stop the levothyroxine and take it off her med list  Thanks

## 2021-09-17 ENCOUNTER — Ambulatory Visit: Payer: Medicare HMO

## 2021-09-17 NOTE — Telephone Encounter (Signed)
Pt notified and med removed from med list

## 2021-09-17 NOTE — Addendum Note (Signed)
Addended by: Tammi Sou on: 09/17/2021 12:31 PM   Modules accepted: Orders

## 2021-09-24 NOTE — Telephone Encounter (Signed)
error 

## 2021-09-27 ENCOUNTER — Other Ambulatory Visit: Payer: Self-pay | Admitting: Family Medicine

## 2021-09-30 ENCOUNTER — Other Ambulatory Visit: Payer: Self-pay | Admitting: Family Medicine

## 2021-09-30 MED ORDER — LORAZEPAM 1 MG PO TABS: ORAL_TABLET | ORAL | 3 refills | Status: DC

## 2021-09-30 NOTE — Telephone Encounter (Signed)
[facility] [place] [facility] [place] patient to contact the pharmacy for refills or they can request refills through [facility]:  Please schedule appointment if longer than 1 year  NEXT APPOINTMENT DATE:  MEDICATION:LORazepam (ATIVAN) 1 MG tablet    Is the patient out of medication?   PHARMACY:  Let patient know to contact pharmacy at the end of the day to make sure medication is ready.  Please notify patient to allow 48-72 hours to process  CLINICAL FILLS OUT ALL BELOW:   LAST REFILL:  QTY:  REFILL DATE:    OTHER COMMENTS:    Okay for refill?  Please advise

## 2021-09-30 NOTE — Telephone Encounter (Signed)
Rx is at [facility] [address] but per note pharmacy is out of med., called and cancelled Rx (can't xfer controlled sub), please [facility] [place]  Last filled on 09/06/21 #15 with 3 refills but rx was cancelled

## 2021-10-01 ENCOUNTER — Telehealth: Payer: Self-pay | Admitting: Family Medicine

## 2021-10-01 NOTE — Telephone Encounter (Signed)
Form in your inbox to complete once you return

## 2021-10-01 NOTE — Telephone Encounter (Signed)
Please let her know I don't get back into the office until wednesday

## 2021-10-01 NOTE — Telephone Encounter (Signed)
Patient came in and dropped off a hospital indemnity claim form to be completed by Dr Glori Bickers. Form was placed in dr towers folder up front. Patient would like a phone call when completed to pick up.

## 2021-10-12 ENCOUNTER — Ambulatory Visit
Admission: RE | Admit: 2021-10-12 | Discharge: 2021-10-12 | Disposition: A | Discharge status: Home / Self Care | Payer: Medicare HMO | Source: Ambulatory Visit | Attending: Family Medicine | Admitting: Family Medicine

## 2021-10-12 DIAGNOSIS — Z1231 Encounter for screening mammogram for malignant neoplasm of breast: Secondary | ICD-10-CM | POA: Diagnosis not present

## 2021-10-13 ENCOUNTER — Other Ambulatory Visit: Payer: Self-pay | Admitting: Family Medicine

## 2021-10-13 DIAGNOSIS — R928 Other abnormal and inconclusive findings on diagnostic imaging of breast: Secondary | ICD-10-CM

## 2021-10-13 DIAGNOSIS — R921 Mammographic calcification found on diagnostic imaging of breast: Secondary | ICD-10-CM

## 2021-10-19 NOTE — Telephone Encounter (Signed)
Patient called in to follow up on if her form was completed.

## 2021-10-20 ENCOUNTER — Ambulatory Visit
Admission: RE | Admit: 2021-10-20 | Discharge: 2021-10-20 | Disposition: A | Discharge status: Home / Self Care | Payer: Medicare HMO | Source: Ambulatory Visit | Attending: Family Medicine | Admitting: Family Medicine

## 2021-10-20 DIAGNOSIS — R928 Other abnormal and inconclusive findings on diagnostic imaging of breast: Secondary | ICD-10-CM | POA: Insufficient documentation

## 2021-10-20 DIAGNOSIS — R921 Mammographic calcification found on diagnostic imaging of breast: Secondary | ICD-10-CM | POA: Insufficient documentation

## 2021-10-21 ENCOUNTER — Other Ambulatory Visit: Payer: Self-pay | Admitting: Family Medicine

## 2021-10-21 DIAGNOSIS — R928 Other abnormal and inconclusive findings on diagnostic imaging of breast: Secondary | ICD-10-CM

## 2021-10-21 DIAGNOSIS — R921 Mammographic calcification found on diagnostic imaging of breast: Secondary | ICD-10-CM

## 2021-10-22 NOTE — Telephone Encounter (Signed)
Form filled out and placed at the front for pick up

## 2021-10-22 NOTE — Telephone Encounter (Signed)
Patient called in and stated she would like for the form to be faxed over to the number on the back of them form please. Thank you!

## 2021-10-22 NOTE — Telephone Encounter (Signed)
Form faxed and mailed original to pt, pt notified

## 2021-10-28 ENCOUNTER — Telehealth: Payer: Self-pay | Admitting: Family Medicine

## 2021-10-28 ENCOUNTER — Ambulatory Visit: Payer: Medicare HMO | Attending: Cardiology

## 2021-10-28 ENCOUNTER — Telehealth: Payer: Self-pay | Admitting: *Deleted

## 2021-10-28 ENCOUNTER — Encounter: Payer: Self-pay | Admitting: *Deleted

## 2021-10-28 ENCOUNTER — Other Ambulatory Visit: Payer: Self-pay | Admitting: *Deleted

## 2021-10-28 DIAGNOSIS — Z01812 Encounter for preprocedural laboratory examination: Secondary | ICD-10-CM

## 2021-10-28 DIAGNOSIS — I4819 Other persistent atrial fibrillation: Secondary | ICD-10-CM

## 2021-10-28 DIAGNOSIS — I480 Paroxysmal atrial fibrillation: Secondary | ICD-10-CM

## 2021-10-28 LAB — BASIC METABOLIC PANEL
BUN/Creatinine Ratio: 11 — ABNORMAL LOW (ref 12–28)
BUN: 10 mg/dL (ref 8–27)
CO2: 27 mmol/L (ref 20–29)
Calcium: 10 mg/dL (ref 8.7–10.3)
Chloride: 104 mmol/L (ref 96–106)
Creatinine, Ser: 0.87 mg/dL (ref 0.57–1.00)
Glucose: 64 mg/dL — ABNORMAL LOW (ref 70–99)
Potassium: 4.6 mmol/L (ref 3.5–5.2)
Sodium: 140 mmol/L (ref 134–144)
eGFR: 73 mL/min/{1.73_m2} (ref >59)

## 2021-10-28 LAB — CBC
Hematocrit: 40.3 % (ref 34.0–46.6)
Hemoglobin: 13.5 g/dL (ref 11.1–15.9)
MCH: 29.3 pg (ref 26.6–33.0)
MCHC: 33.5 g/dL (ref 31.5–35.7)
MCV: 88 fL (ref 79–97)
Platelets: 228 10*3/uL (ref 150–450)
RBC: 4.6 x10E6/uL (ref 3.77–5.28)
RDW: 15.9 % — ABNORMAL HIGH (ref 11.7–15.4)
WBC: 7.1 10*3/uL (ref 3.4–10.8)

## 2021-10-28 NOTE — Telephone Encounter (Signed)
Pt came into office today for pre ablation blood work. Aware CT order placed and they will call her to schedule soon. Reviewed CT & ablation letters verbally with pt. Patient verbalized understanding and agreeable to plan.

## 2021-10-28 NOTE — Telephone Encounter (Signed)
Pt called asking if Tower could make sure she has her Mammogram done at [facility] in [place]? Call back # [phone]

## 2021-10-29 NOTE — Telephone Encounter (Signed)
Pt just had a mammogram and additional imaging done at the [facility]. Called pt and she said that the radiologist recommended a needed biopsy and that's what she is asking to have done at [facility] instead of the [facility]. Please advise

## 2021-11-01 NOTE — Telephone Encounter (Signed)
Please have [facility] reach out to [place] to see what order they need exactly and I am happy to do it  They will need to review her imaging first

## 2021-11-01 NOTE — Telephone Encounter (Signed)
See Padonda,NP's comments. Wouldn't be able to schedule anything at [facility] without an order please advise

## 2021-11-03 ENCOUNTER — Telehealth (HOSPITAL_COMMUNITY): Payer: Self-pay | Admitting: *Deleted

## 2021-11-03 NOTE — Telephone Encounter (Signed)
Reaching out to patient to offer assistance regarding upcoming cardiac imaging study; pt verbalizes understanding of appt date/time, parking situation and where to check in, and verified current allergies; name and call back number provided for further questions should they arise  Gordy Clement RN Navigator Cardiac [facility] [phone] office [phone] cell  Patient aware to arrive at 7:45am.

## 2021-11-04 ENCOUNTER — Ambulatory Visit (HOSPITAL_BASED_OUTPATIENT_CLINIC_OR_DEPARTMENT_OTHER)
Admission: RE | Admit: 2021-11-04 | Discharge: 2021-11-04 | Disposition: A | Discharge status: Home / Self Care | Payer: Medicare HMO | Source: Ambulatory Visit | Attending: Cardiology | Admitting: Cardiology

## 2021-11-04 ENCOUNTER — Encounter (HOSPITAL_BASED_OUTPATIENT_CLINIC_OR_DEPARTMENT_OTHER): Payer: Self-pay

## 2021-11-04 DIAGNOSIS — I4819 Other persistent atrial fibrillation: Secondary | ICD-10-CM | POA: Diagnosis not present

## 2021-11-04 MED ORDER — IOHEXOL 350 MG/ML SOLN
100.0000 mL | Freq: Once | INTRAVENOUS | Status: AC | PRN
  Administered 2021-11-04: 80 mL via INTRAVENOUS

## 2021-11-08 NOTE — Pre-Procedure Instructions (Signed)
Instructed patient on the following items: Arrival time 1130 Nothing to eat or drink after midnight No meds AM of procedure Responsible person to drive you home and stay with you for 24 hrs  Have you missed any doses of anti-coagulant Xarelto- hasn't missed any doses

## 2021-11-09 ENCOUNTER — Other Ambulatory Visit: Payer: Self-pay

## 2021-11-09 ENCOUNTER — Ambulatory Visit (HOSPITAL_BASED_OUTPATIENT_CLINIC_OR_DEPARTMENT_OTHER): Payer: Medicare HMO | Admitting: Anesthesiology

## 2021-11-09 ENCOUNTER — Encounter (HOSPITAL_COMMUNITY)
Admission: RE | Disposition: A | Discharge status: Home / Self Care | Payer: Self-pay | Source: Home / Self Care | Attending: Cardiology

## 2021-11-09 ENCOUNTER — Ambulatory Visit (HOSPITAL_COMMUNITY): Payer: Medicare HMO | Admitting: Anesthesiology

## 2021-11-09 ENCOUNTER — Ambulatory Visit (HOSPITAL_COMMUNITY)
Admission: RE | Admit: 2021-11-09 | Discharge: 2021-11-09 | Disposition: A | Discharge status: Home / Self Care | Payer: Medicare HMO | Attending: Cardiology | Admitting: Cardiology

## 2021-11-09 DIAGNOSIS — I480 Paroxysmal atrial fibrillation: Secondary | ICD-10-CM | POA: Insufficient documentation

## 2021-11-09 DIAGNOSIS — I341 Nonrheumatic mitral (valve) prolapse: Secondary | ICD-10-CM | POA: Diagnosis not present

## 2021-11-09 DIAGNOSIS — Z87891 Personal history of nicotine dependence: Secondary | ICD-10-CM

## 2021-11-09 DIAGNOSIS — I4891 Unspecified atrial fibrillation: Secondary | ICD-10-CM | POA: Diagnosis not present

## 2021-11-09 DIAGNOSIS — M353 Polymyalgia rheumatica: Secondary | ICD-10-CM | POA: Diagnosis not present

## 2021-11-09 DIAGNOSIS — I10 Essential (primary) hypertension: Secondary | ICD-10-CM

## 2021-11-09 DIAGNOSIS — E039 Hypothyroidism, unspecified: Secondary | ICD-10-CM | POA: Diagnosis not present

## 2021-11-09 DIAGNOSIS — R69 Illness, unspecified: Secondary | ICD-10-CM | POA: Diagnosis not present

## 2021-11-09 DIAGNOSIS — F419 Anxiety disorder, unspecified: Secondary | ICD-10-CM

## 2021-11-09 HISTORY — PX: ATRIAL FIBRILLATION ABLATION: EP1191

## 2021-11-09 LAB — POCT ACTIVATED CLOTTING TIME
Activated Clotting Time: 359 seconds
Activated Clotting Time: 323 seconds

## 2021-11-09 SURGERY — ATRIAL FIBRILLATION ABLATION
Anesthesia: General

## 2021-11-09 MED ORDER — SODIUM CHLORIDE 0.9% FLUSH: 3.0000 mL | Freq: Two times a day (BID) | INTRAVENOUS | Status: DC

## 2021-11-09 MED ORDER — HEPARIN SODIUM (PORCINE) 1000 UNIT/ML IJ SOLN
INTRAMUSCULAR | Status: AC
  Filled 2021-11-09: qty 10

## 2021-11-09 MED ORDER — DEXAMETHASONE SODIUM PHOSPHATE 10 MG/ML IJ SOLN
INTRAMUSCULAR | Status: DC | PRN
  Administered 2021-11-09: 5 mg via INTRAVENOUS

## 2021-11-09 MED ORDER — ROCURONIUM BROMIDE 10 MG/ML (PF) SYRINGE
PREFILLED_SYRINGE | INTRAVENOUS | Status: DC | PRN
  Administered 2021-11-09: 50 mg via INTRAVENOUS

## 2021-11-09 MED ORDER — ONDANSETRON HCL 4 MG/2ML IJ SOLN: 4.0000 mg | Freq: Four times a day (QID) | INTRAMUSCULAR | Status: DC | PRN

## 2021-11-09 MED ORDER — PROTAMINE SULFATE 10 MG/ML IV SOLN
INTRAVENOUS | Status: DC | PRN
  Administered 2021-11-09: 40 mg via INTRAVENOUS

## 2021-11-09 MED ORDER — HEPARIN SODIUM (PORCINE) 1000 UNIT/ML IJ SOLN
INTRAMUSCULAR | Status: DC | PRN
  Administered 2021-11-09: 1000 [IU] via INTRAVENOUS

## 2021-11-09 MED ORDER — LIDOCAINE 2% (20 MG/ML) 5 ML SYRINGE
INTRAMUSCULAR | Status: DC | PRN
  Administered 2021-11-09: 40 mg via INTRAVENOUS

## 2021-11-09 MED ORDER — PROPOFOL 10 MG/ML IV BOLUS
INTRAVENOUS | Status: DC | PRN
  Administered 2021-11-09: 150 mg via INTRAVENOUS

## 2021-11-09 MED ORDER — HEPARIN (PORCINE) IN NACL 1000-0.9 UT/500ML-% IV SOLN
INTRAVENOUS | Status: DC | PRN
  Administered 2021-11-09 (×4): 500 mL

## 2021-11-09 MED ORDER — MIDAZOLAM HCL 2 MG/2ML IJ SOLN
INTRAMUSCULAR | Status: DC | PRN
  Administered 2021-11-09: 2 mg via INTRAVENOUS

## 2021-11-09 MED ORDER — SODIUM CHLORIDE 0.9 % IV SOLN: 250.0000 mL | INTRAVENOUS | Status: DC | PRN

## 2021-11-09 MED ORDER — DOBUTAMINE INFUSION FOR EP/ECHO/NUC (1000 MCG/ML)
INTRAVENOUS | Status: AC
  Filled 2021-11-09: qty 250

## 2021-11-09 MED ORDER — OXYCODONE HCL 5 MG/5ML PO SOLN
5.0000 mg | Freq: Once | ORAL | Status: DC | PRN
  Filled 2021-11-09: qty 5

## 2021-11-09 MED ORDER — OXYCODONE HCL 5 MG PO TABS: 5.0000 mg | ORAL_TABLET | Freq: Once | ORAL | Status: DC | PRN

## 2021-11-09 MED ORDER — SODIUM CHLORIDE 0.9 % IV SOLN: INTRAVENOUS | Status: DC

## 2021-11-09 MED ORDER — ONDANSETRON HCL 4 MG/2ML IJ SOLN
INTRAMUSCULAR | Status: DC | PRN
  Administered 2021-11-09: 4 mg via INTRAVENOUS

## 2021-11-09 MED ORDER — HEPARIN (PORCINE) IN NACL 1000-0.9 UT/500ML-% IV SOLN
INTRAVENOUS | Status: AC
  Filled 2021-11-09: qty 2000

## 2021-11-09 MED ORDER — SODIUM CHLORIDE 0.9% FLUSH: 3.0000 mL | INTRAVENOUS | Status: DC | PRN

## 2021-11-09 MED ORDER — PHENYLEPHRINE 80 MCG/ML (10ML) SYRINGE FOR IV PUSH (FOR BLOOD PRESSURE SUPPORT)
PREFILLED_SYRINGE | INTRAVENOUS | Status: DC | PRN
  Administered 2021-11-09: 160 ug via INTRAVENOUS

## 2021-11-09 MED ORDER — SUGAMMADEX SODIUM 200 MG/2ML IV SOLN
INTRAVENOUS | Status: DC | PRN
  Administered 2021-11-09: 130 mg via INTRAVENOUS

## 2021-11-09 MED ORDER — HEPARIN SODIUM (PORCINE) 1000 UNIT/ML IJ SOLN
INTRAMUSCULAR | Status: DC | PRN
  Administered 2021-11-09: 14000 [IU] via INTRAVENOUS

## 2021-11-09 MED ORDER — PHENYLEPHRINE HCL-NACL 20-0.9 MG/250ML-% IV SOLN
INTRAVENOUS | Status: DC | PRN
  Administered 2021-11-09: 30 ug/min via INTRAVENOUS

## 2021-11-09 MED ORDER — ACETAMINOPHEN 325 MG PO TABS: 650.0000 mg | ORAL_TABLET | ORAL | Status: DC | PRN

## 2021-11-09 MED ORDER — DOBUTAMINE INFUSION FOR EP/ECHO/NUC (1000 MCG/ML)
INTRAVENOUS | Status: DC | PRN
  Administered 2021-11-09: 20 ug/kg/min via INTRAVENOUS

## 2021-11-09 MED ORDER — METOPROLOL TARTRATE 25 MG PO TABS
25.0000 mg | ORAL_TABLET | Freq: Once | ORAL | Status: AC
  Administered 2021-11-09: 25 mg via ORAL
  Filled 2021-11-09: qty 1

## 2021-11-09 SURGICAL SUPPLY — 19 items
BAG SNAP BAND KOVER 36X36 (MISCELLANEOUS) IMPLANT
CATH ABLAT QDOT MICRO BI TC DF (CATHETERS) IMPLANT
CATH OCTARAY 2.0 F 3-3-3-3-3 (CATHETERS) IMPLANT
CATH PIGTAIL STEERABLE D1 8.7 (WIRE) IMPLANT
CATH S-M CIRCA TEMP PROBE (CATHETERS) IMPLANT
CATH SOUNDSTAR ECO 8FR (CATHETERS) IMPLANT
CATH WEBSTER BI DIR CS D-F CRV (CATHETERS) IMPLANT
CLOSURE PERCLOSE PROSTYLE (VASCULAR PRODUCTS) IMPLANT
COVER SWIFTLINK CONNECTOR (BAG) ×1 IMPLANT
PACK EP LATEX FREE (CUSTOM PROCEDURE TRAY) ×1
PACK EP LF (CUSTOM PROCEDURE TRAY) ×1 IMPLANT
PAD DEFIB RADIO PHYSIO CONN (PAD) ×1 IMPLANT
PATCH CARTO3 (PAD) IMPLANT
SHEATH CARTO VIZIGO SM CVD (SHEATH) IMPLANT
SHEATH PINNACLE 7F 10CM (SHEATH) IMPLANT
SHEATH PINNACLE 8F 10CM (SHEATH) IMPLANT
SHEATH PINNACLE 9F 10CM (SHEATH) IMPLANT
SHEATH PROBE COVER 6X72 (BAG) IMPLANT
TUBING SMART ABLATE COOLFLOW (TUBING) IMPLANT

## 2021-11-09 NOTE — Anesthesia Preprocedure Evaluation (Addendum)
Anesthesia Evaluation  Patient identified by MRN, date of birth, ID band Patient awake    Reviewed: Allergy & Precautions, H&P , NPO status , Patient's Chart, lab work & pertinent test results  Airway Mallampati: III   Neck ROM: full    Dental  (+) Teeth Intact, Dental Advisory Given   Pulmonary neg pulmonary ROS, former smoker   breath sounds clear to auscultation       Cardiovascular hypertension, + dysrhythmias Atrial Fibrillation  Rhythm:irregular Rate:Normal     Neuro/Psych  Headaches PSYCHIATRIC DISORDERS Anxiety        GI/Hepatic   Endo/Other  Hypothyroidism    Renal/GU      Musculoskeletal  (+) Arthritis ,    Abdominal   Peds  Hematology   Anesthesia Other Findings   Reproductive/Obstetrics                            Anesthesia Physical Anesthesia Plan  ASA: 3  Anesthesia Plan: General   Post-op Pain Management:    Induction: Intravenous  PONV Risk Score and Plan: 3 and Ondansetron, Dexamethasone, Midazolam and Treatment may vary due to age or medical condition  Airway Management Planned: Oral ETT  Additional Equipment:   Intra-op Plan:   Post-operative Plan: Extubation in OR  Informed Consent: I have reviewed the patients History and Physical, chart, labs and discussed the procedure including the risks, benefits and alternatives for the proposed anesthesia with the patient or authorized representative who has indicated his/her understanding and acceptance.     Dental advisory given  Plan Discussed with: Anesthesiologist, CRNA and Surgeon  Anesthesia Plan Comments:        Anesthesia Quick Evaluation

## 2021-11-09 NOTE — Transfer of Care (Signed)
Immediate Anesthesia Transfer of Care Note  Patient: Denise Grant  Procedure(s) Performed: ATRIAL FIBRILLATION ABLATION  Patient Location: PACU  Anesthesia Type:General  Level of Consciousness: awake  Airway & Oxygen Therapy: Patient Spontanous Breathing and Patient connected to nasal cannula oxygen  Post-op Assessment: Report given to RN and Post -op Vital signs reviewed and stable  Post vital signs: Reviewed and stable  Last Vitals:  Vitals Value Taken Time  BP 144/77   Temp    Pulse 102 11/09/21 1428  Resp 23 11/09/21 1428  SpO2 99 % 11/09/21 1428  Vitals shown include unvalidated device data.  Last Pain:  Vitals:   11/09/21 1154  TempSrc:   PainSc: 0-No pain         Complications: There were no known notable events for this encounter.

## 2021-11-09 NOTE — Discharge Instructions (Signed)
Post procedure care instructions No driving for 4 days. No lifting over 5 lbs for 1 week. No vigorous or sexual activity for 1 week. You may return to work/your usual activities on 11/17/21. Keep procedure site clean & dry. If you notice increased pain, swelling, bleeding or pus, call/return!  You may shower after 24 hours, but no soaking in baths/hot tubs/pools for 1 week.   You have an appointment set up with the [facility].  Multiple studies have shown that being followed by a dedicated atrial fibrillation clinic in addition to the standard care you receive from your other physicians improves health. We believe that enrollment in the atrial fibrillation clinic will allow [place] to better care for you.   The phone number to the [facility] is [phone]. The clinic is staffed Monday through Friday from 8:30am to 5pm.  Directions: The clinic is located in the [facility], [place] the hospital at the MAIN ENTRANCE "A", use [address] to the 6th floor.  Registration desk to the right of elevators on 6th floor  If you have any trouble locating the clinic, please don't hesitate to call [phone].

## 2021-11-09 NOTE — Progress Notes (Signed)
Patient called Denise Paradise, RN into the room and stated that she felt like her heart was "really thumping". Patients HR is in the lown 100's. Patient reports also being nervous. Curt Bears, MD notified via Denise Paradise, RN. MD gave verbal order for metoprolol 25 mg PO once. Orders placed by Denise Paradise, RN and given. No further orders. Will continue to monitor.

## 2021-11-09 NOTE — Anesthesia Procedure Notes (Signed)
Procedure Name: Intubation Date/Time: 11/09/2021 12:44 PM  Performed by: Lorie Phenix, CRNAPre-anesthesia Checklist: Patient identified, Emergency Drugs available, Suction available and Patient being monitored Patient Re-evaluated:Patient Re-evaluated prior to induction Oxygen Delivery Method: Circle system utilized Preoxygenation: Pre-oxygenation with 100% oxygen Induction Type: IV induction Ventilation: Mask ventilation without difficulty Laryngoscope Size: Mac and 3 Grade View: Grade I Tube type: Oral Tube size: 7.0 mm Number of attempts: 1 Airway Equipment and Method: Stylet Placement Confirmation: ETT inserted through vocal cords under direct vision, positive ETCO2 and breath sounds checked- equal and bilateral Secured at: 22 cm Tube secured with: Tape Dental Injury: Teeth and Oropharynx as per pre-operative assessment

## 2021-11-09 NOTE — H&P (Signed)
Electrophysiology Office Note   Date:  11/09/2021   ID:  Denise Grant, DOB March 29, 1955, MRN [id number]  PCP:  Abner Greenspan, MD  Cardiologist:   Primary Electrophysiologist:  Kytzia Gienger Meredith Leeds, MD    Chief Complaint: AF   History of Present Illness: Denise Grant is a 66 y.o. female who is being seen today for the evaluation of AF at the request of Denise Grant found. Presenting today for electrophysiology evaluation.  She has a history significant for Chiari malformation, polymyalgia rheumatica, hypertension, hypothyroidism, mitral valve prolapse, anxiety, paroxysmal atrial fibrillation.  She was diagnosed with atrial fibrillation in 2020 after presenting the emergency room with symptoms of shortness of breath chest discomfort and weakness.  She was found to be in rapid atrial fibrillation and underwent cardioversion.  She has been followed in atrial fibrillation clinic.  She has had a few episodes of atrial fibrillation since that time and is now on flecainide.  She unfortunately developed headaches with flecainide and may wish to stop the medication.  Today, denies symptoms of palpitations, chest pain, shortness of breath, orthopnea, PND, lower extremity edema, claudication, dizziness, presyncope, syncope, bleeding, or neurologic sequela. The patient is tolerating medications without difficulties. Plan ablation today.    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.0)    Osteopenia    Seborrheic dermatitis, unspecified    Vaginal cyst    stable   Past Surgical History:  Procedure Laterality Date   AIR/FLUID EXCHANGE Right 07/25/2018   Procedure: Air/Fluid Exchange;  Surgeon: Jalene Mullet, MD;  Location: [place];  Service: Ophthalmology;  Laterality: Right;   bone spur removal  10/06   shoulder   CATARACT EXTRACTION, BILATERAL Bilateral 2016   CERVICAL [place] SURGERY  2005   Chiari Malformation surgery  6/03   EXCISION MORTON'S NEUROMA  2/04   GAS INSERTION Right 07/25/2018   Procedure: Insertion Of Gas SF6;  Surgeon: Jalene Mullet, MD;  Location: [facility];  Service: Ophthalmology;  Laterality: Right;   LUMBAR DISC SURGERY     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    Allergies:   Fentanyl, Buspar [buspirone], Calcium, Fluticasone propionate, Hctz [hydrochlorothiazide], Paroxetine, Zoloft [sertraline], and Hydroxychloroquine   Social History:  The patient  reports that she quit smoking about 20 years ago. Her smoking use included cigarettes. She has been exposed to tobacco smoke. She has never used smokeless tobacco. She reports that she does not drink alcohol and does not use drugs.   Family History:  The patient's family history includes Anxiety disorder in her son; Coronary artery disease in her mother; Heart failure in her mother; Hypertension in her mother; Lung cancer in her brother and father; Melanoma in her sister.   ROS:  Please see the history of present illness.   Otherwise, review of systems is positive for none.   All other systems are reviewed  and negative.   PHYSICAL EXAM: VS:  There were Denise vitals taken for this visit. , BMI There is Denise height or weight on file to calculate BMI. GEN: Well nourished, well developed, in Denise acute distress  HEENT: normal  Neck: Denise JVD, carotid bruits, or masses Cardiac: RRR; Denise murmurs, rubs, or gallops,Denise edema  Respiratory:   clear to auscultation bilaterally, normal work of breathing GI: soft, nontender, nondistended, + BS MS: Denise deformity or atrophy  Skin: warm and dry Neuro:  Strength and sensation are intact Psych: euthymic mood, full affect   Recent Labs: 08/22/2021: B Natriuretic Peptide 238.8 08/24/2021: Magnesium 2.3 09/02/2021: ALT 18; TSH 2.79 10/28/2021: BUN 10; Creatinine, Ser 0.87; Hemoglobin 13.5; Platelets 228; Potassium 4.6; Sodium 140    Lipid Panel     Component Value Date/Time   CHOL 184 09/02/2021 0751   TRIG 76.0 09/02/2021 0751   HDL 65.00 09/02/2021 0751   CHOLHDL 3 09/02/2021 0751   VLDL 15.2 09/02/2021 0751   LDLCALC 104 (H) 09/02/2021 0751     Wt Readings from Last 3 Encounters:  09/06/21 67.1 kg  09/02/21 67.5 kg  08/31/21 67.7 kg      Other studies Reviewed: Additional studies/ records that were reviewed today include: TTE 09/26/18  Review of the above records today demonstrates:   1. The left ventricle has normal systolic function with an ejection  fraction of 60-65%. The cavity size was normal. Left ventricular diastolic  parameters were normal. Denise evidence of left ventricular regional wall  motion abnormalities.   2. The right ventricle has normal systolic function. The cavity was  normal. There is Denise increase in right ventricular wall thickness. Right  ventricular systolic pressure is normal with an estimated pressure of 21.8  mmHg.   3. The pericardial effusion is circumferential.   4. Trivial pericardial effusion is present.   5. Mild thickening of the mitral valve leaflet. There is mild mitral  annular calcification present.   6. Mild thickening of the aortic valve. Aortic valve regurgitation is  mild by color flow Doppler.   7. The aorta is normal unless otherwise noted.    ASSESSMENT AND PLAN:  1.  Paroxysmal atrial fibrillation: Denise Grant has presented today for surgery, with the diagnosis of AF.  The various methods of treatment have been  discussed with the patient and family. After consideration of risks, benefits and other options for treatment, the patient has consented to  Procedure(s): Catheter ablation as a surgical intervention .  Risks include but not limited to complete heart block, stroke, esophageal damage, nerve damage, bleeding, vascular damage, tamponade, perforation, MI, and death. The patient's history has been reviewed, patient examined, Denise change in status, stable for surgery.  I have reviewed the patient's chart and labs.  Questions were answered to the patient's satisfaction.    Marvens Hollars Curt Bears, MD 11/09/2021 11:30 AM

## 2021-11-10 ENCOUNTER — Encounter (HOSPITAL_COMMUNITY): Payer: Self-pay | Admitting: Cardiology

## 2021-11-10 NOTE — Anesthesia Postprocedure Evaluation (Signed)
Anesthesia Post Note  Patient: Denise Grant  Procedure(s) Performed: ATRIAL FIBRILLATION ABLATION     Patient location during evaluation: PACU Anesthesia Type: General Level of consciousness: awake and alert Pain management: pain level controlled Vital Signs Assessment: post-procedure vital signs reviewed and stable Respiratory status: spontaneous breathing, nonlabored ventilation, respiratory function stable and patient connected to nasal cannula oxygen Cardiovascular status: blood pressure returned to baseline and stable Postop Assessment: no apparent nausea or vomiting Anesthetic complications: no   There were no known notable events for this encounter.  Last Vitals:  Vitals:   11/09/21 1729 11/09/21 1759  BP:    Pulse: (!) 105 86  Resp:  14  Temp:    SpO2:  98%    Last Pain:  Vitals:   11/09/21 1515  TempSrc:   PainSc: 0-No pain                 Malisa Ruggiero S

## 2021-11-12 NOTE — Telephone Encounter (Signed)
Orders sent and mammogram results

## 2021-11-12 NOTE — Telephone Encounter (Signed)
They need [place] to fax over order for biopsy to them as well as her records from [facility] and then they will reach out to pt directly to schedule appt., please change orders from [facility] to [facility] that are in her chart and then I will fax everything over

## 2021-11-12 NOTE — Telephone Encounter (Signed)
I think I did it- please let me know if I did not do that correctly

## 2021-11-15 ENCOUNTER — Ambulatory Visit (INDEPENDENT_AMBULATORY_CARE_PROVIDER_SITE_OTHER): Payer: Medicare HMO

## 2021-11-15 ENCOUNTER — Telehealth: Payer: Self-pay | Admitting: Cardiology

## 2021-11-15 VITALS — Ht 67.0 in | Wt 150.0 lb

## 2021-11-15 DIAGNOSIS — Z0000 Encounter for general adult medical examination without abnormal findings: Secondary | ICD-10-CM | POA: Diagnosis not present

## 2021-11-15 NOTE — Progress Notes (Signed)
Subjective:   Denise Grant is a 66 y.o. female who presents for Medicare Annual (Subsequent) preventive examination.  Review of Systems    No ROS.  Medicare Wellness Virtual Visit.  Visual/audio telehealth visit, UTA vital signs.   See social history for additional risk factors.   Cardiac Risk Factors include: advanced age (>28mn, >>72women)     Objective:    Today's Vitals   11/15/21 1405  Weight: 150 lb (68 kg)  Height: '5\' 7"'$  (1.702 m)   Body mass index is 23.49 kg/m.     11/15/2021    2:15 PM 11/09/2021   11:55 AM 08/22/2021    8:00 AM 08/21/2021   11:59 PM 10/19/2020    8:40 AM 10/12/2019    4:52 PM 09/19/2018    3:51 PM  Advanced Directives  Does Patient Have a Medical Advance Directive? Yes No  No No No No  Type of Advance Directive HWall       Does patient want to make changes to medical advance directive? No - Patient declined        Copy of HGladein Chart? No - copy requested        Would patient like information on creating a medical advance directive?  No - Patient declined No - Patient declined   No - Patient declined No - Patient declined    Current Medications (verified) Outpatient Encounter Medications as of 11/15/2021  Medication Sig   acetaminophen (TYLENOL) 325 MG tablet Take 650 mg by mouth every 6 (six) hours as needed for moderate pain, mild pain or headache.   amiodarone (PACERONE) 200 MG tablet Take 1 tablet (200 mg total) by mouth daily.   diltiazem (CARDIZEM) 30 MG tablet Take 1 tablet every 4 hours AS NEEDED for AFIB HR >100   levothyroxine (SYNTHROID) 50 MCG tablet Take 50 mcg by mouth daily before breakfast.   LORazepam (ATIVAN) 1 MG tablet TAKE A HALF TABLET BY MOUTH AT BEDTIME AS NEEDED   mometasone (ELOCON) 0.1 % ointment Apply topically daily. Apply to itchy areas on the back bid prn (Patient not taking: Reported on 11/03/2021)   predniSONE (DELTASONE) 1 MG tablet Take 1 mg by mouth 4 (four)  times daily.   XARELTO 20 MG TABS tablet TAKE 1 TABLET BY MOUTH DAILY WITH SUPPER.   No facility-administered encounter medications on file as of 11/15/2021.   Allergies (verified) Fentanyl, Buspar [buspirone], Calcium, Fluticasone propionate, Hctz [hydrochlorothiazide], Paroxetine, Zoloft [sertraline], and Hydroxychloroquine   History: 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.0)    Osteopenia    Seborrheic dermatitis, unspecified    Vaginal cyst    stable   Past Surgical History:  Procedure Laterality Date   AIR/FLUID EXCHANGE Right 07/25/2018   Procedure: Air/Fluid Exchange;  Surgeon: PJalene Mullet MD;  Location: [facility]  Service: Ophthalmology;  Laterality: Right;   ATRIAL FIBRILLATION ABLATION N/A 11/09/2021   Procedure: ATRIAL FIBRILLATION ABLATION;  Surgeon: CConstance Haw MD;  [facility];  Service: Cardiovascular;  Laterality: N/A;   bone spur removal  10/06   shoulder   CATARACT EXTRACTION, BILATERAL Bilateral 2016  CERVICAL DISC SURGERY  2005   Chiari Malformation surgery  6/03   EXCISION MORTON'S NEUROMA  2/04   GAS INSERTION Right 07/25/2018   Procedure: Insertion Of Gas SF6;  Surgeon: Jalene Mullet, MD;  Location: [facility];  Service: Ophthalmology;  Laterality: Right;   LUMBAR DISC SURGERY     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   Procedure: REPAIR OF COMPLEX TRACTION RETINAL DETACHMENT - 25GA VITRECTOMY, ENDOLASER, DRAINAGE OF SUBRETINAL FLUID;  Surgeon: Jalene Mullet, MD;  Location: [facility];  Service: Ophthalmology;  Laterality: Right;   TUBAL LIGATION     Family History  Problem Relation Age of Onset   Melanoma Sister    Lung cancer Father    Coronary  artery disease Mother    Heart failure Mother    Hypertension Mother    Anxiety disorder Son    Lung cancer Brother        + smoker   Breast cancer Neg Hx    Social History   Socioeconomic History   Marital status: Widowed    Spouse name: Not on file   Number of children: Not on file   Years of education: Not on file   Highest education level: Not on file  Occupational History   Not on file  Tobacco Use   Smoking status: Former    Types: Cigarettes    Quit date: 01/17/2001    Years since quitting: 20.8    Passive exposure: Past   Smokeless tobacco: Never   Tobacco comments:    Former smoker 01/13/2021  Substance and Sexual Activity   Alcohol use: No    Alcohol/week: 0.0 standard drinks of alcohol   Drug use: No   Sexual activity: Not on file  Other Topics Concern   Not on file  Social History Narrative   Walks daily for exercise      Married--husband diagnosed with stomach cancer (5/11)   Lost her husband 4/12 to cancer    Social Determinants of Health   Financial Resource Strain: Low Risk  (11/15/2021)   Overall Financial Resource Strain (CARDIA)    Difficulty of Paying Living Expenses: Not hard at all  Food Insecurity: No Food Insecurity (11/15/2021)   Hunger Vital Sign    Worried About Running Out of Food in the Last Year: Never true    [place] in the Last Year: Never true  Transportation Needs: No Transportation Needs (11/15/2021)   PRAPARE - Hydrologist (Medical): No    Lack of Transportation (Non-Medical): No  Physical Activity: Sufficiently Active (11/15/2021)   Exercise Vital Sign    Days of Exercise per Week: 5 days    Minutes of Exercise per Session: 30 min  Stress: No Stress Concern Present (11/15/2021)   [place]    Feeling of Stress : Not at all  Social Connections: Unknown (11/15/2021)   Social Connection and Isolation Panel [NHANES]     Frequency of Communication with Friends and Family: More than three times a week    Frequency of Social Gatherings with Friends and Family: More than three times a week    Attends Religious Services: Not on Advertising copywriter or Organizations: Not on file    Attends Archivist Meetings: Not on file    Marital Status: Not on file    Tobacco Counseling Counseling given: Not Answered Tobacco comments: Former smoker  01/13/2021   Clinical Intake:  Pre-visit preparation completed: Yes        Diabetes: No  How often do you need to have someone help you when you read instructions, pamphlets, or other written materials from your doctor or pharmacy?: 1 - Never  Interpreter Needed?: No    Activities of Daily Living    11/15/2021    2:07 PM 09/06/2021    8:30 AM  In your present state of health, do you have any difficulty performing the following activities:  Hearing? 0 0  Vision? 0 0  Difficulty concentrating or making decisions? 0 0  Walking or climbing stairs? 0 0  Dressing or bathing? 0 0  Doing errands, shopping? 0 0  Preparing Food and eating ? N   Using the Toilet? N   In the past six months, have you accidently leaked urine? N   Do you have problems with loss of bowel control? N   Managing your Medications? N   Managing your Finances? N   Housekeeping or managing your Housekeeping? N     Patient Care Team: Tower, Wynelle Fanny, MD as PCP - General Alda Berthold, DO as Consulting Physician (Neurology)  Indicate any recent Medical Services you may have received from other than [facility] providers in the past year (date may be approximate).     Assessment:   This is a routine wellness examination for [place].  I connected with  Denise Grant on 11/15/21 by a audio enabled telemedicine application and verified that I am speaking with the correct person using two identifiers.  Patient Location: Home  Provider Location: Office/Clinic  I discussed  the limitations of evaluation and management by telemedicine. The patient expressed understanding and agreed to proceed.   Hearing/Vision screen Hearing Screening - Comments:: Patient is able to hear conversational tones without difficulty.  No issues reported.   Vision Screening - Comments:: Followed by Dr. Katy Fitch Cataract extraction, bilateral They have seen their ophthalmologist in the last 12 months.    Dietary issues and exercise activities discussed: Current Exercise Habits: Home exercise routine, Type of exercise: walking (golfing), Time (Minutes): 35, Frequency (Times/Week): 5, Weekly Exercise (Minutes/Week): 175, Intensity: Mild   Goals Addressed             This Visit's Progress    Maintain Healthy Lifestyle   On track    Take medication as directed Healthy diet Stay active       Depression Screen    11/15/2021    2:10 PM 09/06/2021    8:27 AM 09/02/2021    9:36 AM 08/31/2020    9:47 AM 08/30/2019   10:15 AM 08/30/2019   10:14 AM 08/24/2018    2:53 PM  PHQ 2/9 Scores  PHQ - 2 Score 0 0 0 0 1 0 2  PHQ- 9 Score     1  5    Fall Risk    11/15/2021    2:07 PM 09/06/2021    8:26 AM 09/02/2021    9:36 AM 10/19/2020    8:40 AM 08/31/2020    9:47 AM  Fall Risk   Falls in the past year? 0 0 0 0 0  Number falls in past yr: 0   0   Injury with Fall? 0   0   Risk for fall due to : No Fall Risks      Follow up Falls evaluation completed Falls evaluation completed Falls evaluation completed  Falls evaluation completed  FALL RISK PREVENTION PERTAINING TO THE HOME: Home free of loose throw rugs in walkways, pet beds, electrical cords, etc? Yes  Adequate lighting in your home to reduce risk of falls? Yes   ASSISTIVE DEVICES UTILIZED TO PREVENT FALLS: Life alert? No  Use of a cane, walker or w/c? No   TIMED UP AND GO: Was the test performed? No .   Cognitive Function:        11/15/2021    2:17 PM  6CIT Screen  What Year? 0 points  What month? 0 points   What time? 0 points  Count back from 20 0 points  Months in reverse 0 points  Repeat phrase 0 points  Total Score 0 points    Immunizations Immunization History  Administered Date(s) Administered   Influenza Inj Mdck Quad Pf 10/14/2017   Influenza Split 11/24/2010   Influenza, High Dose Seasonal PF 10/10/2019   Influenza, Seasonal, Injecte, Preservative Fre 10/21/2015   Influenza,inj,Quad PF,6+ Mos 10/27/2016, 09/27/2018   Influenza-Unspecified 10/18/2013, 11/01/2014, 11/05/2020   Moderna Sars-Covid-2 Vaccination 04/06/2019, 05/04/2019   Td 04/17/2002   Tdap 05/11/2012   Flu Vaccine status: Due, Education has been provided regarding the importance of this vaccine. Advised may receive this vaccine at local pharmacy or Health Dept. Aware to provide a copy of the vaccination record if obtained from local pharmacy or Health Dept. Verbalized acceptance and understanding.  Covid-19 vaccine status: Completed vaccines x2.  Screening Tests Health Maintenance  Topic Date Due   COVID-19 Vaccine (3 - Moderna risk series) 12/01/2021 (Originally 06/01/2019)   Zoster Vaccines- Shingrix (1 of 2) 12/07/2021 (Originally [birth year]11/1974)   INFLUENZA VACCINE  04/17/2022 (Originally 08/17/2021)   Pneumonia Vaccine 59+ Years old (1 - PCV) 09/07/2022 (Originally 03/12/2019)   Hepatitis C Screening  11/16/2022 (Originally 03/11/1973)   TETANUS/TDAP  05/12/2022   Fecal DNA (Cologuard)  09/27/2022   MAMMOGRAM  10/13/2022   Medicare Annual Wellness (AWV)  11/16/2022   DEXA SCAN  Completed   HPV VACCINES  Aged Out   COLON CANCER SCREENING ANNUAL FOBT  Discontinued   Health Maintenance There are no preventive care reminders to display for this patient.  Lung Cancer Screening: (Low Dose CT Chest recommended if Age 84-80 years, 30 pack-year currently smoking OR have quit w/in 15years.) does not qualify.   Hepatitis C Screening: deferred per patient.   Vision Screening: Recommended annual ophthalmology exams  for early detection of glaucoma and other disorders of the eye.  Dental Screening: Recommended annual dental exams for proper oral hygiene.  Community Resource Referral / Chronic Care Management: CRR required this visit?  No   CCM required this visit?  No      Plan:     I have personally reviewed and noted the following in the patient's chart:   Medical and social history Use of alcohol, tobacco or illicit drugs  Current medications and supplements including opioid prescriptions. Patient is not currently taking opioid prescriptions. Functional ability and status Nutritional status Physical activity Advanced directives List of other physicians Hospitalizations, surgeries, and ER visits in previous 12 months Vitals Screenings to include cognitive, depression, and falls Referrals and appointments  In addition, I have reviewed and discussed with patient certain preventive protocols, quality metrics, and best practice recommendations. A written personalized care plan for preventive services as well as general preventive health recommendations were provided to patient.     Leta Jungling, LPN   [id number]

## 2021-11-15 NOTE — Patient Instructions (Addendum)
[name] , Thank you for taking time to come for your Medicare Wellness Visit. I appreciate your ongoing commitment to your health goals. Please review the following plan we discussed and let me know if I can assist you in the future.   These are the goals we discussed:  Goals      Maintain Healthy Lifestyle     Take medication as directed Healthy diet Stay active        This is a list of the screening recommended for you and due dates:  Health Maintenance  Topic Date Due   COVID-19 Vaccine (3 - Moderna risk series) 12/01/2021*   Zoster (Shingles) Vaccine (1 of 2) 12/07/2021*   Flu Shot  04/17/2022*   Pneumonia Vaccine (1 - PCV) 09/07/2022*   Hepatitis C Screening: USPSTF Recommendation to screen - Ages 18-79 yo.  11/16/2022*   Tetanus Vaccine  05/12/2022   Cologuard (Stool DNA test)  09/27/2022   Mammogram  10/13/2022   Medicare Annual Wellness Visit  11/16/2022   DEXA scan (bone density measurement)  Completed   HPV Vaccine  Aged Out   Stool Blood Test  Discontinued  *Topic was postponed. The date shown is not the original due date.    Advanced directives: End of life planning; Advance aging; Advanced directives discussed.  Copy of current HCPOA/Living Will requested.    Conditions/risks identified: none new  Next appointment: Follow up in one year for your annual wellness visit    Preventive Care 67 Years and Older, Female Preventive care refers to lifestyle choices and visits with your health care provider that can promote health and wellness. What does preventive care include? A yearly physical exam. This is also called an annual well check. Dental exams once or twice a year. Routine eye exams. Ask your health care provider how often you should have your eyes checked. Personal lifestyle choices, including: Daily care of your teeth and gums. Regular physical activity. Eating a healthy diet. Avoiding tobacco and drug use. Limiting alcohol use. Practicing safe  sex. Taking low-dose aspirin every day. Taking vitamin and mineral supplements as recommended by your health care provider. What happens during an annual well check? The services and screenings done by your health care provider during your annual well check will depend on your age, overall health, lifestyle risk factors, and family history of disease. Counseling  Your health care provider may ask you questions about your: Alcohol use. Tobacco use. Drug use. Emotional well-being. Home and relationship well-being. Sexual activity. Eating habits. History of falls. Memory and ability to understand (cognition). Work and work Statistician. Reproductive health. Screening  You may have the following tests or measurements: Height, weight, and BMI. Blood pressure. Lipid and cholesterol levels. These may be checked every 5 years, or more frequently if you are over 55 years old. Skin check. Lung cancer screening. You may have this screening every year starting at age 34 if you have a 30-pack-year history of smoking and currently smoke or have quit within the past 15 years. Fecal occult blood test (FOBT) of the stool. You may have this test every year starting at age 51. Flexible sigmoidoscopy or colonoscopy. You may have a sigmoidoscopy every 5 years or a colonoscopy every 10 years starting at age 63. Hepatitis C blood test. Hepatitis B blood test. Sexually transmitted disease (STD) testing. Diabetes screening. This is done by checking your blood sugar (glucose) after you have not eaten for a while (fasting). You may have this done every  1-3 years. Bone density scan. This is done to screen for osteoporosis. You may have this done starting at age 38. Mammogram. This may be done every 1-2 years. Talk to your health care provider about how often you should have regular mammograms. Talk with your health care provider about your test results, treatment options, and if necessary, the need for more  tests. Vaccines  Your health care provider may recommend certain vaccines, such as: Influenza vaccine. This is recommended every year. Tetanus, diphtheria, and acellular pertussis (Tdap, Td) vaccine. You may need a Td booster every 10 years. Zoster vaccine. You may need this after age 51. Pneumococcal 13-valent conjugate (PCV13) vaccine. One dose is recommended after age 33. Pneumococcal polysaccharide (PPSV23) vaccine. One dose is recommended after age 76. Talk to your health care provider about which screenings and vaccines you need and how often you need them. This information is not intended to replace advice given to you by your health care provider. Make sure you discuss any questions you have with your health care provider. Document Released: 01/30/2015 Document Revised: 09/23/2015 Document Reviewed: 11/04/2014 Elsevier Interactive Patient Education  2017 [place] Prevention in the Home Falls can cause injuries. They can happen to people of all ages. There are many things you can do to make your home safe and to help prevent falls. What can I do on the outside of my home? Regularly fix the edges of walkways and driveways and fix any cracks. Remove anything that might make you trip as you walk through a door, such as a raised step or threshold. Trim any bushes or trees on the path to your home. Use bright outdoor lighting. Clear any walking paths of anything that might make someone trip, such as rocks or tools. Regularly check to see if handrails are loose or broken. Make sure that both sides of any steps have handrails. Any raised decks and porches should have guardrails on the edges. Have any leaves, snow, or ice cleared regularly. Use sand or salt on walking paths during winter. Clean up any spills in your garage right away. This includes oil or grease spills. What can I do in the bathroom? Use night lights. Install grab bars by the toilet and in the tub and shower.  Do not use towel bars as grab bars. Use non-skid mats or decals in the tub or shower. If you need to sit down in the shower, use a plastic, non-slip stool. Keep the floor dry. Clean up any water that spills on the floor as soon as it happens. Remove soap buildup in the tub or shower regularly. Attach bath mats securely with double-sided non-slip rug tape. Do not have throw rugs and other things on the floor that can make you trip. What can I do in the bedroom? Use night lights. Make sure that you have a light by your bed that is easy to reach. Do not use any sheets or blankets that are too big for your bed. They should not hang down onto the floor. Have a firm chair that has side arms. You can use this for support while you get dressed. Do not have throw rugs and other things on the floor that can make you trip. What can I do in the kitchen? Clean up any spills right away. Avoid walking on wet floors. Keep items that you use a lot in easy-to-reach places. If you need to reach something above you, use a strong step stool that has a  grab bar. Keep electrical cords out of the way. Do not use floor polish or wax that makes floors slippery. If you must use wax, use non-skid floor wax. Do not have throw rugs and other things on the floor that can make you trip. What can I do with my stairs? Do not leave any items on the stairs. Make sure that there are handrails on both sides of the stairs and use them. Fix handrails that are broken or loose. Make sure that handrails are as long as the stairways. Check any carpeting to make sure that it is firmly attached to the stairs. Fix any carpet that is loose or worn. Avoid having throw rugs at the top or bottom of the stairs. If you do have throw rugs, attach them to the floor with carpet tape. Make sure that you have a light switch at the top of the stairs and the bottom of the stairs. If you do not have them, ask someone to add them for you. What else  can I do to help prevent falls? Wear shoes that: Do not have high heels. Have rubber bottoms. Are comfortable and fit you well. Are closed at the toe. Do not wear sandals. If you use a stepladder: Make sure that it is fully opened. Do not climb a closed stepladder. Make sure that both sides of the stepladder are locked into place. Ask someone to hold it for you, if possible. Clearly mark and make sure that you can see: Any grab bars or handrails. First and last steps. Where the edge of each step is. Use tools that help you move around (mobility aids) if they are needed. These include: Canes. Walkers. Scooters. Crutches. Turn on the lights when you go into a dark area. Replace any light bulbs as soon as they burn out. Set up your furniture so you have a clear path. Avoid moving your furniture around. If any of your floors are uneven, fix them. If there are any pets around you, be aware of where they are. Review your medicines with your doctor. Some medicines can make you feel dizzy. This can increase your chance of falling. Ask your doctor what other things that you can do to help prevent falls. This information is not intended to replace advice given to you by your health care provider. Make sure you discuss any questions you have with your health care provider. Document Released: 10/30/2008 Document Revised: 06/11/2015 Document Reviewed: 02/07/2014 Elsevier Interactive Patient Education  2017 [employer].

## 2021-11-15 NOTE — Telephone Encounter (Signed)
Headaches are improved but encouraged to use tylenol PRN. Eye is not painful and no vision issues just "blood shot". Reassured pt but to notify should eye become painful or visual disturbance. Pt verbalized agreement.

## 2021-11-15 NOTE — Telephone Encounter (Signed)
Patient called stating she had a headache during the night and this morning.  She woke up to her right eye being blood shot red this morning.

## 2021-11-18 DIAGNOSIS — Z961 Presence of intraocular lens: Secondary | ICD-10-CM | POA: Diagnosis not present

## 2021-11-18 DIAGNOSIS — H10413 Chronic giant papillary conjunctivitis, bilateral: Secondary | ICD-10-CM | POA: Diagnosis not present

## 2021-11-18 DIAGNOSIS — H04123 Dry eye syndrome of bilateral lacrimal glands: Secondary | ICD-10-CM | POA: Diagnosis not present

## 2021-11-18 DIAGNOSIS — H1131 Conjunctival hemorrhage, right eye: Secondary | ICD-10-CM | POA: Diagnosis not present

## 2021-11-30 ENCOUNTER — Other Ambulatory Visit: Payer: Self-pay | Admitting: Radiology

## 2021-11-30 DIAGNOSIS — N6489 Other specified disorders of breast: Secondary | ICD-10-CM | POA: Diagnosis not present

## 2021-11-30 DIAGNOSIS — R921 Mammographic calcification found on diagnostic imaging of breast: Secondary | ICD-10-CM | POA: Diagnosis not present

## 2021-12-07 ENCOUNTER — Ambulatory Visit (HOSPITAL_COMMUNITY)
Admission: RE | Admit: 2021-12-07 | Discharge: 2021-12-07 | Disposition: A | Discharge status: Home / Self Care | Payer: Medicare HMO | Source: Ambulatory Visit | Attending: Nurse Practitioner | Admitting: Nurse Practitioner

## 2021-12-07 VITALS — BP 162/74 | HR 75 | Ht 67.0 in | Wt 142.2 lb

## 2021-12-07 DIAGNOSIS — E039 Hypothyroidism, unspecified: Secondary | ICD-10-CM | POA: Insufficient documentation

## 2021-12-07 DIAGNOSIS — I2510 Atherosclerotic heart disease of native coronary artery without angina pectoris: Secondary | ICD-10-CM | POA: Insufficient documentation

## 2021-12-07 DIAGNOSIS — I10 Essential (primary) hypertension: Secondary | ICD-10-CM | POA: Insufficient documentation

## 2021-12-07 DIAGNOSIS — G935 Compression of brain: Secondary | ICD-10-CM | POA: Insufficient documentation

## 2021-12-07 DIAGNOSIS — I4891 Unspecified atrial fibrillation: Secondary | ICD-10-CM | POA: Insufficient documentation

## 2021-12-07 DIAGNOSIS — Z7901 Long term (current) use of anticoagulants: Secondary | ICD-10-CM | POA: Diagnosis not present

## 2021-12-07 DIAGNOSIS — M5136 Other intervertebral disc degeneration, lumbar region: Secondary | ICD-10-CM | POA: Insufficient documentation

## 2021-12-07 NOTE — Progress Notes (Signed)
Primary Care Physician: Tower, Wynelle Fanny, MD Referring Physician: Dr. Elberta Fortis Denise Grant is a 66 y.o. female with a h/o CAD, hypertension, hypothyroidism, Chiari malformation, degenerative disc disease, atrial fibrillation on Xarelto and metoprolol presented to the ED with chief complaints of shortness of breath, palpitations and lightheadedness.  Patient had taken 12.5 mg of metoprolol and 25 mg of flecainide without any improvement so she presented in the ED.  She was found to have A-fib with RVR on arrival with v rates in the 170's.   Valsalva maneuvers were attempted but were unsuccessful.  Patient was given IV Cardizem without any improvement.  Synchronized cardioversion was attempted  three times without success.  On-call cardiologist was consulted who recommended chemical cardioversion with amiodarone.  Patient was admitted in ICU and remained in ICU and was managed with amiodarone infusion.  Heart rate was controlled and she converted to normal sinus rhythm.  Patient was cleared from cardiology to be discharged.  Cardiology advised to take amiodarone 200 mg twice daily for 1 week followed by 200 mg daily.  Follow-up with the EP physician as an outpatient.  Patient felt better and wanted to be discharged.  She is now in the afib clinic for f/u. EKG shows SR.  She remains on amiodarone 200 mg daily. She is tentatively scheduled for ablation 10/24. She has had a few episodes of afib but lasting only around 10 mins. She feels anxious on the drug, unfortunately, do not have many options to maintain SR until we can get her to ablation. She did not tolerate flecainide in  the past.  She lowered the dose of amio 2 days ago to 200 mg daily.   F/u in afib clinic, 12/07/21. She is one month s/p afib ablation. She is maintaining  SR. No swallowing or groin issues. Her BP is mildly elevated but she has to go soon to have a root canal today. Back to her usual activities. Continues on xarelto.   Today,  she denies symptoms of palpitations, chest pain, shortness of breath, orthopnea, PND, lower extremity edema, dizziness, presyncope, syncope, or neurologic sequela. The patient is tolerating medications without difficulties and is otherwise without complaint today.   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.0)    Osteopenia    Seborrheic dermatitis, unspecified    Vaginal cyst    stable   Past Surgical History:  Procedure Laterality Date   AIR/FLUID EXCHANGE Right 07/25/2018   Procedure: Air/Fluid Exchange;  Surgeon: Jalene Mullet, MD;  Location: [facility];  Service: Ophthalmology;  Laterality: Right;   ATRIAL FIBRILLATION ABLATION N/A 11/09/2021   Procedure: ATRIAL FIBRILLATION ABLATION;  Surgeon: Constance Haw, MD;  Location: [facility];  Service: Cardiovascular;  Laterality: N/A;   bone spur removal  10/06   shoulder   CATARACT EXTRACTION, BILATERAL Bilateral 2016   CERVICAL [place] SURGERY  2005   Chiari Malformation surgery  6/03   EXCISION [facility]'S NE[facility]OMA  2/04   GAS INSERTION Right 07/25/2018   Procedure: Insertion Of Gas SF6;  Surgeon: Jalene Mullet, MD;  Location: [facility];  Service: Ophthalmology;  Laterality: Right;   LUMBAR DISC SURGERY  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     amiodarone (PACERONE) 200 MG tablet Take 1 tablet (200 mg total) by mouth daily. 30 tablet 3   amoxicillin (AMOXIL) 500 MG capsule Take 500 mg by mouth every 6 (six) hours.     diltiazem (CARDIZEM) 30 MG tablet Take 1 tablet every 4 hours AS NEEDED for AFIB HR >100 30 tablet 1   levothyroxine (SYNTHROID) 50 MCG tablet Take 50 mcg by mouth daily before breakfast.     LORazepam (ATIVAN) 1 MG tablet TAKE A HALF TABLET BY MOUTH AT BEDTIME AS NEEDED 15 tablet 3   mometasone (ELOCON) 0.1 % ointment Apply topically daily. Apply to itchy areas on the back bid prn 45 g 1   predniSONE (DELTASONE) 1 MG tablet Take 1 mg by mouth in the morning, at noon, and at bedtime.     XARELTO 20 MG TABS tablet TAKE 1 TABLET BY MOUTH DAILY WITH SUPPER. 90 tablet 2   No current facility-administered medications for this encounter.    Allergies  Allergen Reactions   Fentanyl Other (See Comments)    Seizure    Buspar [Buspirone]     Felt funny   Calcium     REACTION: cannot tolerate any calcium supplement - GI sympotms   Fluticasone Propionate     REACTION: headache/irritation   Hctz [Hydrochlorothiazide] Other (See Comments)    Eye redness and discomfort/ slt blurred vision    Paroxetine     REACTION: abdominal pain and constipation   Zoloft [Sertraline]     Diarrhea    Hydroxychloroquine Itching and Rash    Social History   Socioeconomic History   Marital status: Widowed    Spouse name: Not on file   Number of children: Not on file   Years of education: Not on file   Highest education level: Not on file  Occupational History   Not on file  Tobacco Use   Smoking status: Former    Types: Cigarettes    Quit date: 01/17/2001    Years since quitting: 20.9    Passive exposure: Past   Smokeless tobacco: Never   Tobacco comments:    Former smoker 01/13/2021  Substance and Sexual Activity   Alcohol use: No    Alcohol/week: 0.0 standard drinks of alcohol   Drug use: No    Sexual activity: Not on file  Other Topics Concern   Not on file  Social History Narrative   Walks daily for exercise      Married--husband diagnosed with stomach cancer (5/11)   Lost her husband 4/12 to cancer    Social Determinants of Health   Financial Resource Strain: Low Risk  (11/15/2021)   Overall Financial Resource Strain (CARDIA)    Difficulty of Paying Living Expenses: Not hard at all  Food Insecurity: No Food Insecurity (11/15/2021)   Hunger Vital Sign    Worried About Running Out of Food in the Last Year: Never true    [place] in the Last Year: Never true  Transportation  Needs: No Transportation Needs (11/15/2021)   PRAPARE - Hydrologist (Medical): No    Lack of Transportation (Non-Medical): No  Physical Activity: Sufficiently Active (11/15/2021)   Exercise Vital Sign    Days of Exercise per Week: 5 days    Minutes of Exercise per Session: 30 min  Stress: No Stress Concern Present (11/15/2021)   [place]    Feeling of Stress : Not at all  Social Connections: Unknown (11/15/2021)   Social Connection and Isolation Panel [NHANES]    Frequency of Communication with Friends and Family: More than three times a week    Frequency of Social Gatherings with Friends and Family: More than three times a week    Attends Religious Services: Not on file    Active Member of [place] or Organizations: Not on file    Attends Archivist Meetings: Not on file    Marital Status: Not on file  Intimate Partner Violence: Not At Risk (11/15/2021)   Humiliation, Afraid, Rape, and Kick questionnaire    Fear of Current or Ex-Partner: No    Emotionally Abused: No    Physically Abused: No    Sexually Abused: No    Family History  Problem Relation Age of Onset   Melanoma Sister    Lung cancer Father    Coronary artery disease Mother    Heart failure Mother    Hypertension  Mother    Anxiety disorder Son    Lung cancer Brother        + smoker   Breast cancer Neg Hx     ROS- All systems are reviewed and negative except as per the HPI above  Physical Exam: Vitals:   12/07/21 0942  BP: (!) 162/74  Pulse: 75  Weight: 64.5 kg  Height: '5\' 7"'$  (1.702 m)   Wt Readings from Last 3 Encounters:  12/07/21 64.5 kg  11/15/21 68 kg  11/09/21 68 kg    Labs: Lab Results  Component Value Date   NA 140 10/28/2021   K 4.6 10/28/2021   CL 104 10/28/2021   CO2 27 10/28/2021   GLUCOSE 64 (L) 10/28/2021   BUN 10 10/28/2021   CREATININE 0.87 10/28/2021   CALCIUM 10.0 10/28/2021   PHOS 4.4 08/22/2021   MG 2.3 08/24/2021   Lab Results  Component Value Date   INR 1.8 (H) 08/22/2021   Lab Results  Component Value Date   CHOL 184 09/02/2021   HDL 65.00 09/02/2021   LDLCALC 104 (H) 09/02/2021   TRIG 76.0 09/02/2021     GEN- The patient is well appearing, alert and oriented x 3 today.   Head- normocephalic, atraumatic Eyes-  Sclera clear, conjunctiva pink Ears- hearing intact Oropharynx- clear Neck- supple, no JVP Lymph- no cervical lymphadenopathy Lungs- Clear to ausculation bilaterally, normal work of breathing Heart- Regular rate and rhythm, no murmurs, rubs or gallops, PMI not laterally displaced GI- soft, NT, ND, + BS Extremities- no clubbing, cyanosis, or edema MS- no significant deformity or atrophy Skin- no rash or lesion Psych- euthymic mood, full affect Neuro- strength and sensation are intact  EKG- Vent. rate 75 BPM PR interval 166 ms QRS duration 72 ms QT/QTcB 386/431 ms P-R-T axes 74 -54 60 Normal sinus rhythm Left axis deviation Low voltage QRS Inferior infarct , age undetermined Cannot rule out Anteroseptal infarct , age undetermined Abnormal ECG When compared with ECG of 09-Nov-2021 14:31, PREVIOUS ECG IS  PRESENT   Assessment and Plan:  1. Afib  S/p afib ablation 11/09/21 Maintaining SR  Continues on amiodarone 200 mg  qd    2. HTN States BP is elevated today for a pending root canal later today  Follow at home and notify the office if stays elevated   3. [id number]  score of 3 Continue xarelto 20 mg daily, reminded not to interrupt anticoagulation for the 3 month recovery period    F/u with Dr. Curt Bears as scheduled 02/08/22  Geroge Baseman. Oliviana Mcgahee, [facility] [address] [place], [place] [address] [phone]

## 2021-12-12 ENCOUNTER — Other Ambulatory Visit (HOSPITAL_COMMUNITY): Payer: Self-pay | Admitting: Physician Assistant

## 2021-12-14 DIAGNOSIS — M353 Polymyalgia rheumatica: Secondary | ICD-10-CM | POA: Diagnosis not present

## 2021-12-14 DIAGNOSIS — Z7952 Long term (current) use of systemic steroids: Secondary | ICD-10-CM | POA: Diagnosis not present

## 2021-12-15 NOTE — Telephone Encounter (Signed)
Pt was just seen by the A-Fib clinic 12/07/21, can you guys refill this medication, because Dr. Curt Bears has not seen this pt in awhile. Please address

## 2021-12-17 DIAGNOSIS — M810 Age-related osteoporosis without current pathological fracture: Secondary | ICD-10-CM | POA: Diagnosis not present

## 2021-12-21 ENCOUNTER — Encounter: Payer: Self-pay | Admitting: Family Medicine

## 2022-01-15 DIAGNOSIS — N390 Urinary tract infection, site not specified: Secondary | ICD-10-CM | POA: Diagnosis not present

## 2022-01-21 DIAGNOSIS — Z8249 Family history of ischemic heart disease and other diseases of the circulatory system: Secondary | ICD-10-CM | POA: Diagnosis not present

## 2022-01-21 DIAGNOSIS — I4891 Unspecified atrial fibrillation: Secondary | ICD-10-CM | POA: Diagnosis not present

## 2022-01-21 DIAGNOSIS — I2510 Atherosclerotic heart disease of native coronary artery without angina pectoris: Secondary | ICD-10-CM | POA: Diagnosis not present

## 2022-01-21 DIAGNOSIS — Z87891 Personal history of nicotine dependence: Secondary | ICD-10-CM | POA: Diagnosis not present

## 2022-01-21 DIAGNOSIS — F411 Generalized anxiety disorder: Secondary | ICD-10-CM | POA: Diagnosis not present

## 2022-01-21 DIAGNOSIS — D6869 Other thrombophilia: Secondary | ICD-10-CM | POA: Diagnosis not present

## 2022-01-21 DIAGNOSIS — L309 Dermatitis, unspecified: Secondary | ICD-10-CM | POA: Diagnosis not present

## 2022-01-21 DIAGNOSIS — R69 Illness, unspecified: Secondary | ICD-10-CM | POA: Diagnosis not present

## 2022-01-21 DIAGNOSIS — D84821 Immunodeficiency due to drugs: Secondary | ICD-10-CM | POA: Diagnosis not present

## 2022-01-21 DIAGNOSIS — I110 Hypertensive heart disease with heart failure: Secondary | ICD-10-CM | POA: Diagnosis not present

## 2022-01-21 DIAGNOSIS — E039 Hypothyroidism, unspecified: Secondary | ICD-10-CM | POA: Diagnosis not present

## 2022-01-21 DIAGNOSIS — M1990 Unspecified osteoarthritis, unspecified site: Secondary | ICD-10-CM | POA: Diagnosis not present

## 2022-01-21 DIAGNOSIS — Z008 Encounter for other general examination: Secondary | ICD-10-CM | POA: Diagnosis not present

## 2022-01-21 DIAGNOSIS — M069 Rheumatoid arthritis, unspecified: Secondary | ICD-10-CM | POA: Diagnosis not present

## 2022-02-08 ENCOUNTER — Encounter: Payer: Self-pay | Admitting: Cardiology

## 2022-02-08 ENCOUNTER — Ambulatory Visit: Payer: Medicare HMO | Attending: Cardiology | Admitting: Cardiology

## 2022-02-08 VITALS — BP 146/76 | HR 89 | Ht 67.0 in | Wt 136.2 lb

## 2022-02-08 DIAGNOSIS — I10 Essential (primary) hypertension: Secondary | ICD-10-CM | POA: Diagnosis not present

## 2022-02-08 DIAGNOSIS — I480 Paroxysmal atrial fibrillation: Secondary | ICD-10-CM

## 2022-02-08 DIAGNOSIS — D6869 Other thrombophilia: Secondary | ICD-10-CM | POA: Diagnosis not present

## 2022-02-08 MED ORDER — METOPROLOL SUCCINATE ER 50 MG PO TB24: 50.0000 mg | ORAL_TABLET | Freq: Every day | ORAL | 6 refills | Status: DC

## 2022-02-08 NOTE — Progress Notes (Signed)
Electrophysiology Office Note   Date:  02/08/2022   ID:  Charon, Smedberg Jul 04, 1955, MRN [id number]  PCP:  Abner Greenspan, MD  Cardiologist:   Primary Electrophysiologist:  Kwan Shellhammer Meredith Leeds, MD    Chief Complaint: AF   History of Present Illness: Denise Grant is a 67 y.o. female who is being seen today for the evaluation of AF at the request of Tower, Wynelle Fanny, MD. Presenting today for electrophysiology evaluation.  She has a history significant Chiari malformation, polymyalgia rheumatica, hypertension, hypothyroidism, mitral valve prolapse, anxiety, paroxysmal atrial fibrillation.  She was diagnosed with atrial fibrillation in 2020 after presenting to the emergency room with shortness of breath and chest discomfort with weakness.  She had a cardioversion at that time.  She is now status post ablation 11/09/2021.  Today, denies symptoms of palpitations, chest pain, shortness of breath, orthopnea, PND, lower extremity edema, claudication, dizziness, presyncope, syncope, bleeding, or neurologic sequela. The patient is tolerating medications without difficulties.  Since her ablation she has done well.  She is not had chest pain or shortness of breath.  She felt that she may have had an episode of atrial fibrillation over this past weekend.  Her heart rate was in the 120s.  She took a dose of diltiazem.  Her wearable monitor shows sinus rhythm with heart rates in the upper 80s.  This is the first time that she is felt that her heart rate is gone up.  She has otherwise done well without complaint.    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.0)    Osteopenia    Seborrheic dermatitis, unspecified    Vaginal cyst     stable   Past Surgical History:  Procedure Laterality Date   AIR/FLUID EXCHANGE Right 07/25/2018   Procedure: Air/Fluid Exchange;  Surgeon: Jalene Mullet, MD;  Location: [facility];  Service: Ophthalmology;  Laterality: Right;   ATRIAL FIBRILLATION ABLATION N/A 11/09/2021   Procedure: ATRIAL FIBRILLATION ABLATION;  Surgeon: Constance Haw, MD;  Location: [facility];  Service: Cardiovascular;  Laterality: N/A;   bone spur removal  10/06   shoulder   CATARACT EXTRACTION, BILATERAL Bilateral 2016   CERVICAL [place] SURGERY  2005   Chiari Malformation surgery  6/03   EXCISION MORTON'S NEUROMA  2/04   GAS INSERTION Right 07/25/2018   Procedure: Insertion Of Gas SF6;  Surgeon: Jalene Mullet, MD;  Location: [facility];  Service: Ophthalmology;  Laterality: Right;   LUMBAR DISC SURGERY     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     amiodarone (PACERONE) 200 MG tablet TAKE 1 TABLET BY MOUTH EVERY DAY 90 tablet 1   diltiazem (CARDIZEM) 30 MG tablet Take 1 tablet every 4  hours AS NEEDED for AFIB HR >100 30 tablet 1   levothyroxine (SYNTHROID) 50 MCG tablet Take 50 mcg by mouth daily before breakfast.     LORazepam (ATIVAN) 1 MG tablet TAKE A HALF TABLET BY MOUTH AT BEDTIME AS NEEDED 15 tablet 3   metoprolol succinate (TOPROL-XL) 50 MG 24 hr tablet Take 1 tablet (50 mg total) by mouth daily. Take with or immediately following a meal. 30 tablet 6   predniSONE (DELTASONE) 1 MG tablet Take 1 mg by mouth in the morning, at noon, and at bedtime.      XARELTO 20 MG TABS tablet TAKE 1 TABLET BY MOUTH DAILY WITH SUPPER. 90 tablet 2   No current facility-administered medications for this visit.    Allergies:   Fentanyl, Buspar [buspirone], Calcium, Fluticasone propionate, Hctz [hydrochlorothiazide], Paroxetine, Zoloft [sertraline], and Hydroxychloroquine   Social History:  The patient  reports that she quit smoking about 21 years ago. Her smoking use included cigarettes. She has been exposed to tobacco smoke. She has never used smokeless tobacco. She reports that she does not drink alcohol and does not use drugs.   Family History:  The patient's family history includes Anxiety disorder in her son; Coronary artery disease in her mother; Heart failure in her mother; Hypertension in her mother; Lung cancer in her brother and father; Melanoma in her sister.    ROS:  Please see the history of present illness.   Otherwise, review of systems is positive for none.   All other systems are reviewed and negative.   PHYSICAL EXAM: VS:  BP (!) 146/76   Pulse 89   Ht '5\' 7"'$  (1.702 m)   Wt 136 lb 3.2 oz (61.8 kg)   SpO2 99%   BMI 21.33 kg/m  , BMI Body mass index is 21.33 kg/m. GEN: Well nourished, well developed, in no acute distress  HEENT: normal  Neck: no JVD, carotid bruits, or masses Cardiac: RRR; no murmurs, rubs, or gallops,no edema  Respiratory:  clear to auscultation bilaterally, normal work of breathing GI: soft, nontender, nondistended, + BS MS: no deformity or atrophy  Skin: warm and dry Neuro:  Strength and sensation are intact Psych: euthymic mood, full affect  EKG:  EKG is ordered today. Personal review of the ekg ordered shows sinus rhythm    Recent Labs: 08/22/2021: B Natriuretic Peptide 238.8 08/24/2021: Magnesium 2.3 09/02/2021: ALT 18; TSH 2.79 10/28/2021: BUN 10; Creatinine, Ser 0.87; Hemoglobin 13.5; Platelets 228; Potassium 4.6; Sodium 140    Lipid Panel     Component Value Date/Time   CHOL 184 09/02/2021 0751    TRIG 76.0 09/02/2021 0751   HDL 65.00 09/02/2021 0751   CHOLHDL 3 09/02/2021 0751   VLDL 15.2 09/02/2021 0751   LDLCALC 104 (H) 09/02/2021 0751     Wt Readings from Last 3 Encounters:  02/08/22 136 lb 3.2 oz (61.8 kg)  12/07/21 142 lb 3.2 oz (64.5 kg)  11/15/21 150 lb (68 kg)      Other studies Reviewed: Additional studies/ records that were reviewed today include: TTE 09/26/18  Review of the above records today demonstrates:   1. The left ventricle has normal systolic function with an ejection  fraction of 60-65%. The cavity size was normal. Left ventricular diastolic  parameters were normal. No evidence of left ventricular regional wall  motion abnormalities.   2. The right ventricle has normal systolic function. The cavity was  normal. There is no increase in right ventricular wall thickness. Right  ventricular systolic pressure is normal with an estimated pressure of 21.8  mmHg.   3. The pericardial effusion is circumferential.   4. Trivial pericardial effusion is present.   5. Mild thickening of the mitral valve leaflet. There is mild mitral  annular calcification present.   6. Mild thickening of the aortic valve. Aortic valve regurgitation is  mild by color flow Doppler.   7. The aorta is normal unless otherwise noted.    ASSESSMENT AND PLAN:  1.  Paroxysmal atrial fibrillation: Currently on Xarelto 20 mg daily.  CHA2DS2-VASc 3.  Status post ablation 11/09/2021.  She is currently feeling well without major complaint.  She did feel like she potentially had an episode of atrial fibrillation over the weekend, but her wearable monitor shows sinus rhythm.  As she is remained in sinus rhythm, Kelsey Durflinger plan to stop amiodarone.  2.  Secondary hypercoagulable state: Currently on Xarelto for atrial fibrillation above  3.  Hypertension: Blood pressure is elevated today.  Deric Bocock start Toprol-XL 50 mg daily.  Current medicines are reviewed at length with the patient today.   The patient  does not have concerns regarding her medicines.  The following changes were made today: Stop amiodarone, start Toprol-XL  Labs/ tests ordered today include:  Orders Placed This Encounter  Procedures   EKG 12-Lead     Disposition:   FU 6 months  Signed, Randee Upchurch Meredith Leeds, MD  02/08/2022 11:44 AM     [address] [place] [address] [phone] (office) [phone] (fax)

## 2022-02-08 NOTE — Patient Instructions (Addendum)
Medication Instructions:  Your physician has recommended you make the following change in your medication:  START Toprol 50 mg once daily at bedtime  This is for your blood pressure.  Keep track at home and follow up with your primary doctor  *If you need a refill on your cardiac medications before your next appointment, please call your pharmacy*   Lab Work: None ordered   Testing/Procedures: None ordered   Follow-Up: At [facility], you and your health needs are our priority.  As part of our continuing mission to provide you with exceptional heart care, we have created designated Provider Care Teams.  These Care Teams include your primary Cardiologist (physician) and Advanced Practice Providers (APPs -  Physician Assistants and Nurse Practitioners) who all work together to provide you with the care you need, when you need it.   Your next appointment:   6 month(s)  The format for your next appointment:   In Person  Provider:   Allegra Lai, MD    Thank you for choosing [facility]!!   Trinidad Curet, RN [phone]

## 2022-02-11 ENCOUNTER — Other Ambulatory Visit: Payer: Self-pay | Admitting: Family Medicine

## 2022-02-11 NOTE — Telephone Encounter (Signed)
Name of Medication: Ativan Name of Pharmacy: [facility] or Written Date and Quantity: 09/30/21 #15 tabs/ 3 refills Last Office Visit and Type: CPE 09/06/21 Next Office Visit and Type: f/u 02/14/22

## 2022-02-14 ENCOUNTER — Encounter: Payer: Self-pay | Admitting: Family Medicine

## 2022-02-14 ENCOUNTER — Ambulatory Visit (INDEPENDENT_AMBULATORY_CARE_PROVIDER_SITE_OTHER): Payer: Medicare HMO | Admitting: Family Medicine

## 2022-02-14 VITALS — BP 150/90 | HR 57 | Temp 97.7°F | Ht 67.0 in | Wt 140.2 lb

## 2022-02-14 DIAGNOSIS — E039 Hypothyroidism, unspecified: Secondary | ICD-10-CM

## 2022-02-14 DIAGNOSIS — I480 Paroxysmal atrial fibrillation: Secondary | ICD-10-CM | POA: Diagnosis not present

## 2022-02-14 DIAGNOSIS — R8290 Unspecified abnormal findings in urine: Secondary | ICD-10-CM | POA: Diagnosis not present

## 2022-02-14 DIAGNOSIS — I10 Essential (primary) hypertension: Secondary | ICD-10-CM

## 2022-02-14 DIAGNOSIS — Z87440 Personal history of urinary (tract) infections: Secondary | ICD-10-CM | POA: Diagnosis not present

## 2022-02-14 LAB — POC URINALSYSI DIPSTICK (AUTOMATED)
Bilirubin, UA: 1
Blood, UA: 50 — AB
Glucose, UA: NEGATIVE
Ketones, UA: 5
Nitrite, UA: NEGATIVE
Protein, UA: POSITIVE — AB
Spec Grav, UA: 1.02 (ref 1.010–1.025)
Urobilinogen, UA: 0.2 E.U./dL
pH, UA: 6 (ref 5.0–8.0)

## 2022-02-14 MED ORDER — METOPROLOL SUCCINATE ER 25 MG PO TB24: 25.0000 mg | ORAL_TABLET | Freq: Every day | ORAL | 3 refills | Status: DC

## 2022-02-14 NOTE — Progress Notes (Signed)
Subjective:    Patient ID: Denise Grant, female    DOB: 31-Jul-1955, 67 y.o.   MRN: [id number]  HPI Pt presents for f/u of HTN and chronic medical problems    Wt Readings from Last 3 Encounters:  02/14/22 140 lb 4 oz (63.6 kg)  02/08/22 136 lb 3.2 oz (61.8 kg)  12/07/21 142 lb 3.2 oz (64.5 kg)   21.97 kg/m  Vitals:   02/14/22 1026  BP: (!) 150/90  Pulse: (!) 57  Temp: 97.7 F (36.5 C)  SpO2: 97%    Had a uti end of dec Seen in UC  Given macrobid  Symptoms are better (had blood and frequency prior)   Drinking lots of water   Results for orders placed or performed in visit on 02/14/22  POCT Urinalysis Dipstick (Automated)  Result Value Ref Range   Color, UA Yellow    Clarity, UA Hazy    Glucose, UA Negative Negative   Bilirubin, UA 1 mg/dL    Ketones, UA 5 mg/dL    Spec Grav, UA 1.020 1.010 - 1.025   Blood, UA 50 Ery/uL (A)    pH, UA 6.0 5.0 - 8.0   Protein, UA Positive (A) Negative   Urobilinogen, UA 0.2 0.2 or 1.0 E.U./dL   Nitrite, UA Negative    Leukocytes, UA Large (3+) (A) Negative     Exercise- walks 30 min per day when she feels up to it   Pt wanted to check ua to make sure it resolved    HTN In setting of parox a fib   No cp or palpitations or headaches or edema  No side effects to medicines  BP Readings from Last 3 Encounters:  02/14/22 (!) 150/90  02/08/22 (!) 146/76  12/07/21 (!) 162/74    Today pt's cuff is running higher than outs syst and lower diastolic Her cuff 737/10  Pulse Readings from Last 3 Encounters:  02/14/22 (!) 57  02/08/22 89  12/07/21 75     Last visit was not on medications   Currently  Diltiazem 30 mg prn for afib tachycardia  Metoprolol xl 50 mg daily -started recently (50 made her dizzy so she cut it in 1/2)  50 mg made her pulse too low    She was intol of hctz in the past    Amiodarone 200 mg daily was stopped - thinks her energy is coming back  [place]   Cardiology visit was recent  sees Dr  Curt Bears  Ablation in [place]  Still in sinus rhythm  F/u planned 6 mo    Takes low dose prednisone for PMR Also arthritis 2-3 mg prednisone daily  Glad to get down to that Still has some chronic pain    Hypothyroidism Lab Results  Component Value Date   TSH 2.79 09/02/2021   Levothyroxine 50 mcg daily    Patient Active Problem List   Diagnosis Date Noted   History of UTI 02/14/2022   Abnormal urinalysis 02/14/2022   Encounter for screening mammogram for breast cancer 09/06/2021   PMR (polymyalgia rheumatica) ([place]) 09/06/2021   Atrial fibrillation with RVR ([place]) 08/22/2021   Abnormal x-ray of knee 08/17/2021   Effusion of right knee 08/17/2021   Osteopenia of right lower leg 08/17/2021   Acute pain of right knee 08/16/2021   Pain and swelling of right lower leg 08/16/2021   Posterior knee pain, right 08/16/2021   Rheumatoid factor positive 08/28/2020   Myalgia 08/25/2020   Secondary hypercoagulable state (  [place]) 07/05/2019   Lumbar disc disease    Bilateral bunions    Paroxysmal atrial fibrillation ([place]) 09/27/2018   Essential hypertension 01/01/2015   Hypothyroidism 06/25/2014   Migraine with vertigo 05/21/2014   History of Chiari malformation 05/21/2014   Atrophic vaginitis 05/01/2013   Routine general medical examination at a health care facility 11/18/2010   INSOMNIA 01/27/2010   HEADACHE 03/31/2008   Osteopenia 09/18/2007   Generalized anxiety disorder 05/05/2006   MITRAL VALVE PROLAPSE 05/05/2006   ALLERGIC RHINITIS 05/05/2006   FIBROCYSTIC BREAST DISEASE 05/05/2006   SEBORRHEIC DERMATITIS 05/05/2006   KERATOSIS, ACTINIC 05/05/2006   NECK PAIN, CHRONIC 05/05/2006   Past Medical History:  Diagnosis Date   Actinic keratosis    Allergic rhinitis    Anxiety    Chiari malformation    DDD (degenerative disc disease)    Family history of other specified malignant neoplasm    Headache(784.0)    Insomnia, unspecified    Lactose intolerance    Mitral valve  disorders(424.0)    Osteopenia    Seborrheic dermatitis, unspecified    Vaginal cyst    stable   Past Surgical History:  Procedure Laterality Date   AIR/FLUID EXCHANGE Right 07/25/2018   Procedure: Air/Fluid Exchange;  Surgeon: Jalene Mullet, MD;  Location: [facility];  Service: Ophthalmology;  Laterality: Right;   ATRIAL FIBRILLATION ABLATION N/A 11/09/2021   Procedure: ATRIAL FIBRILLATION ABLATION;  Surgeon: Constance Haw, MD;  Location: [facility];  Service: Cardiovascular;  Laterality: N/A;   bone spur removal  10/06   shoulder   CATARACT EXTRACTION, BILATERAL Bilateral 2016   CERVICAL [place] SURGERY  2005   Chiari Malformation surgery  6/03   EXCISION MORTON'S NEUROMA  2/04   GAS INSERTION Right 07/25/2018   Procedure: Insertion Of Gas SF6;  Surgeon: Jalene Mullet, MD;  Location: [facility];  Service: Ophthalmology;  Laterality: Right;   LUMBAR DISC SURGERY     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   Procedure: REPAIR OF COMPLEX TRACTION RETINAL DETACHMENT - 25GA VITRECTOMY, ENDOLASER, DRAINAGE OF SUBRETINAL FLUID;  Surgeon: Jalene Mullet, MD;  Location: [facility];  Service: Ophthalmology;  Laterality: Right;   TUBAL LIGATION     Social History   Tobacco Use   Smoking status: Former    Types: Cigarettes    Quit date: 01/17/2001    Years since quitting: 21.0    Passive exposure: Past   Smokeless tobacco: Never   Tobacco comments:    Former smoker 01/13/2021  Substance Use Topics   Alcohol use: No    Alcohol/week: 0.0 standard drinks of alcohol   Drug use: No   Family History  Problem Relation Age of Onset   Melanoma Sister    Lung cancer Father    Coronary artery disease Mother    Heart failure Mother    Hypertension Mother    Anxiety disorder Son    Lung cancer Brother        + smoker   Breast cancer Neg Hx    Allergies  Allergen Reactions   Fentanyl Other (See Comments)    Seizure    Buspar [Buspirone]     Felt funny   Calcium      REACTION: cannot tolerate any calcium supplement - GI sympotms   Fluticasone Propionate     REACTION: headache/irritation   Hctz [Hydrochlorothiazide] Other (See Comments)    Eye redness and discomfort/ slt blurred vision    Paroxetine  REACTION: abdominal pain and constipation   Zoloft [Sertraline]     Diarrhea    Hydroxychloroquine Itching and Rash   Current Outpatient Medications on File Prior to Visit  Medication Sig Dispense Refill   acetaminophen (TYLENOL) 325 MG tablet Take 650 mg by mouth every 6 (six) hours as needed for moderate pain, mild pain or headache.     diltiazem (CARDIZEM) 30 MG tablet Take 1 tablet every 4 hours AS NEEDED for AFIB HR >100 30 tablet 1   levothyroxine (SYNTHROID) 50 MCG tablet Take 50 mcg by mouth daily before breakfast.     LORazepam (ATIVAN) 1 MG tablet TAKE 1/2 TABLET BY MOUTH AT BEDTIME AS NEEDED 15 tablet 3   predniSONE (DELTASONE) 1 MG tablet Take 1 mg by mouth in the morning, at noon, and at bedtime.     XARELTO 20 MG TABS tablet TAKE 1 TABLET BY MOUTH DAILY WITH SUPPER. 90 tablet 2   No current facility-administered medications on file prior to visit.     Review of Systems  Constitutional:  Negative for activity change, appetite change, fatigue, fever and unexpected weight change.  HENT:  Negative for congestion, ear pain, rhinorrhea, sinus pressure and sore throat.   Eyes:  Negative for pain, redness and visual disturbance.  Respiratory:  Negative for cough, shortness of breath and wheezing.   Cardiovascular:  Negative for chest pain and palpitations.  Gastrointestinal:  Negative for abdominal pain, blood in stool, constipation and diarrhea.  Endocrine: Negative for polydipsia and polyuria.  Genitourinary:  Negative for dysuria, frequency and urgency.  Musculoskeletal:  Negative for arthralgias, back pain and myalgias.  Skin:  Negative for pallor and rash.  Allergic/Immunologic: Negative for environmental allergies.  Neurological:   Negative for dizziness, syncope and headaches.  Hematological:  Negative for adenopathy. Does not bruise/bleed easily.  Psychiatric/Behavioral:  Negative for decreased concentration and dysphoric mood. The patient is not nervous/anxious.        Objective:   Physical Exam Constitutional:      General: She is not in acute distress.    Appearance: Normal appearance. She is well-developed and normal weight. She is not ill-appearing or diaphoretic.  HENT:     Head: Normocephalic and atraumatic.  Eyes:     Conjunctiva/sclera: Conjunctivae normal.     Pupils: Pupils are equal, round, and reactive to light.  Neck:     Thyroid: No thyromegaly.     Vascular: No carotid bruit or JVD.  Cardiovascular:     Rate and Rhythm: Normal rate and regular rhythm.     Heart sounds: Normal heart sounds.     No gallop.  Pulmonary:     Effort: Pulmonary effort is normal. No respiratory distress.     Breath sounds: Normal breath sounds. No stridor. No wheezing, rhonchi or rales.  Abdominal:     General: There is no distension or abdominal bruit.     Palpations: Abdomen is soft.     Comments: No suprapubic tenderness or fullness  No cva tenderness   Musculoskeletal:     Cervical back: Normal range of motion and neck supple.     Right lower leg: No edema.     Left lower leg: No edema.  Lymphadenopathy:     Cervical: No cervical adenopathy.  Skin:    General: Skin is warm and dry.     Coloration: Skin is not pale.     Findings: No rash.  Neurological:     Mental Status: She is alert.  Coordination: Coordination normal.     Deep Tendon Reflexes: Reflexes are normal and symmetric. Reflexes normal.  Psychiatric:        Mood and Affect: Mood normal.           Assessment & Plan:   Problem List Items Addressed This Visit       Cardiovascular and Mediastinum   Essential hypertension - Primary    BP: (!) 150/90   Recently put on metoprolol xl 50- she was trying to split because pulse got  low/was dizzy Disc not splitting xl product  Px metoprolol xl 25 mg daily  Will update in a wk with pulse and bp and if not at goal (likely) we will add amlodipine which she has taken before  Her bp cuff runs mildly high/aware  Rev latest labs  Rev cardiology note from this mo      Relevant Medications   metoprolol succinate (TOPROL-XL) 25 MG 24 hr tablet   Paroxysmal atrial fibrillation [facility])    Reviewed cardiology note from this mo  Now off amoidarone   On metoprolol xl for bp (was tyring to split pill so we px new 25 mg)   Will update with bp/pulse next week  Watching for s/s of a fib       Relevant Medications   metoprolol succinate (TOPROL-XL) 25 MG 24 hr tablet     Endocrine   Hypothyroidism    Now off amoidarone  Disc that this may affect thyroid   Will re check TSH in 2-3 wk Currently takes levothyroxine 50 mcg daily      Relevant Medications   metoprolol succinate (TOPROL-XL) 25 MG 24 hr tablet     Genitourinary   History of UTI    Reviewed UC notes from 12/30  At the time pt had gross hematuria and frequency  Was px macrobid  Unable to pull up culture result  Clinically resolved  Ua still has leuk Sent for cx Explained that this may be colonization and if so may not tx (if no sympt)      Relevant Orders   Urine Culture   POCT Urinalysis Dipstick (Automated) (Completed)     Other   Abnormal urinalysis    Cx pend No sympt Tx with macrobid on 12/30 from UC (notes reviewed)  May be colonized       Relevant Orders   Urine Culture

## 2022-02-14 NOTE — Assessment & Plan Note (Signed)
BP: (!) 150/90   Recently put on metoprolol xl 50- she was trying to split because pulse got low/was dizzy Disc not splitting xl product  Px metoprolol xl 25 mg daily  Will update in a wk with pulse and bp and if not at goal (likely) we will add amlodipine which she has taken before  Her bp cuff runs mildly high/aware  Rev latest labs  Rev cardiology note from this mo

## 2022-02-14 NOTE — Assessment & Plan Note (Signed)
Cx pend No sympt Tx with macrobid on 12/30 from UC (notes reviewed)  May be colonized

## 2022-02-14 NOTE — Assessment & Plan Note (Signed)
Now off amoidarone  Disc that this may affect thyroid   Will re check TSH in 2-3 wk Currently takes levothyroxine 50 mcg daily

## 2022-02-14 NOTE — Assessment & Plan Note (Signed)
Reviewed UC notes from 12/30  At the time pt had gross hematuria and frequency  Was px macrobid  Unable to pull up culture result  Clinically resolved  Ua still has leuk Sent for cx Explained that this may be colonization and if so may not tx (if no sympt)

## 2022-02-14 NOTE — Assessment & Plan Note (Signed)
Reviewed cardiology note from this mo  Now off amoidarone   On metoprolol xl for bp (was tyring to split pill so we px new 25 mg)   Will update with bp/pulse next week  Watching for s/s of a fib

## 2022-02-14 NOTE — Patient Instructions (Addendum)
Stop splitting the metoprolol xl 50   Get the 25 mg metoprolol xl and take one daily  Alert [place] if side effect Watch your blood pressure and pulse In about a week message me with range or average by your machine  If not improved we will add amlodipine   Take care of yourself  Watch the sodium in your diet   We will send your urine sample for a culture  Let us know if you have any urinary symptoms  Keep drinking water   Let's check your tsh in about 2-3 weeks  Off the amiodarone it may change

## 2022-02-15 LAB — URINE CULTURE
MICRO NUMBER:: 14486441
SPECIMEN QUALITY:: ADEQUATE

## 2022-02-21 ENCOUNTER — Telehealth: Payer: Self-pay | Admitting: Family Medicine

## 2022-02-21 MED ORDER — METOPROLOL TARTRATE 25 MG PO TABS: 12.5000 mg | ORAL_TABLET | Freq: Two times a day (BID) | ORAL | 1 refills | Status: DC

## 2022-02-21 NOTE — Telephone Encounter (Signed)
Patient called in stating that she started metoprolol succinate (TOPROL-XL) 25 MG 24 hr tablet last week and needed to give Dr. Glori Bickers an update. She stated that her blood pressure has been running around 125-126 over 65. She stated that her heart rate has been running 40's at night and mid 50's during the day. Thank you!

## 2022-02-21 NOTE — Telephone Encounter (Signed)
Pt notified of Dr. Marliss Coots comments. Pt said she feels a little light headed when she gets up in the middle of the night to use the bathroom and sometimes in the morning. Also pt said she has been a little fatigued also, no other sxs

## 2022-02-21 NOTE — Telephone Encounter (Signed)
The dizziness in the setting of low pulse is a bit worrisome   An option would be to change from the xl 25 to the short acting med (then it can be cut in 1/2)   Stop the metoprolol xl 35  Start  metoprolol 25 mg   1/2 pill bid -then see how she feels and how bp /pulse is in 1-2 wk  I sent to her pharmacy  Update [place] in 1-2 weeks

## 2022-02-21 NOTE — Addendum Note (Signed)
Addended by: Loura Pardon A on: 02/21/2022 12:36 PM   Modules accepted: Orders

## 2022-02-21 NOTE — Telephone Encounter (Signed)
That blood pressure is good Is she dizzy with the lower pulse?  Thanks

## 2022-02-21 NOTE — Telephone Encounter (Signed)
Pt notified of Dr. Marliss Coots instructions and verbalized understanding. She will get new med and update [place] in 1-2 weeks

## 2022-02-23 ENCOUNTER — Telehealth: Payer: Self-pay | Admitting: Family Medicine

## 2022-02-23 NOTE — Telephone Encounter (Signed)
Please stop it then  (thanks for trying)  Take off med list please  Let me know how bp and pulse are on Friday after it is out of her system We may need to add amlodipine for blood pressure (I think she was on it before)   Thanks

## 2022-02-23 NOTE — Telephone Encounter (Signed)
Patient called to let Dr Glori Bickers know that medicationmetoprolol tartrate (LOPRESSOR) 25 MG tablet is worse than the one that she was on before, It is keeping her heart rate in the 40s during the day and at night time.

## 2022-02-23 NOTE — Telephone Encounter (Signed)
Pt notified of Dr. Marliss Coots comments and verbalized understanding. Pt will d/c med and update [place] on BP/ Pulse on Friday.

## 2022-02-24 ENCOUNTER — Encounter (HOSPITAL_COMMUNITY): Payer: Self-pay | Admitting: *Deleted

## 2022-02-25 MED ORDER — AMLODIPINE BESYLATE 5 MG PO TABS: 5.0000 mg | ORAL_TABLET | Freq: Every day | ORAL | 0 refills | Status: DC

## 2022-02-25 NOTE — Telephone Encounter (Signed)
I sent in amlodipine 5 mg to start daily  While she takes this do not take any of the diltiazem that she has ordered prn   Tell me how bp is next week Thanks   I sent ina short supply and we can increase # later if it works out

## 2022-02-25 NOTE — Addendum Note (Signed)
Addended by: Loura Pardon A on: 02/25/2022 01:12 PM   Modules accepted: Orders

## 2022-02-25 NOTE — Telephone Encounter (Signed)
Pt notified of Dr. Marliss Coots instructions and to hold the diltiazem pt will update [place] next week on how her BP is doing

## 2022-02-25 NOTE — Telephone Encounter (Signed)
Patient called in and stated that her blood pressure is running between 134/64 and 148/72 with her pulse between upper 50's and about 70. She stated that before she was in the hospital and they moved her medicines around she was doing good on the 12.5 MG Metoprolol and 5 MG of Amlodipine once a day. Thank you!

## 2022-02-28 ENCOUNTER — Encounter: Payer: Self-pay | Admitting: Family Medicine

## 2022-03-01 ENCOUNTER — Other Ambulatory Visit: Payer: Self-pay | Admitting: Family Medicine

## 2022-03-03 ENCOUNTER — Telehealth: Payer: Self-pay | Admitting: Family Medicine

## 2022-03-03 DIAGNOSIS — E039 Hypothyroidism, unspecified: Secondary | ICD-10-CM

## 2022-03-03 NOTE — Telephone Encounter (Signed)
-----   Message from Velna Hatchet, RT sent at 02/16/2022 10:23 AM EST ----- Regarding: Fri 2/16 lab Future lab orders needed for labs only appt on 03/04/22, please. Thanks, Anda Kraft

## 2022-03-04 ENCOUNTER — Other Ambulatory Visit (INDEPENDENT_AMBULATORY_CARE_PROVIDER_SITE_OTHER): Payer: Medicare HMO

## 2022-03-04 DIAGNOSIS — E039 Hypothyroidism, unspecified: Secondary | ICD-10-CM | POA: Diagnosis not present

## 2022-03-04 LAB — TSH: TSH: 1.79 u[IU]/mL (ref 0.35–5.50)

## 2022-03-07 ENCOUNTER — Other Ambulatory Visit: Payer: Medicare HMO

## 2022-03-17 ENCOUNTER — Telehealth: Payer: Self-pay | Admitting: Family Medicine

## 2022-03-17 DIAGNOSIS — Z7952 Long term (current) use of systemic steroids: Secondary | ICD-10-CM | POA: Diagnosis not present

## 2022-03-17 DIAGNOSIS — I4891 Unspecified atrial fibrillation: Secondary | ICD-10-CM

## 2022-03-17 DIAGNOSIS — E039 Hypothyroidism, unspecified: Secondary | ICD-10-CM

## 2022-03-17 DIAGNOSIS — M353 Polymyalgia rheumatica: Secondary | ICD-10-CM | POA: Diagnosis not present

## 2022-03-17 DIAGNOSIS — M810 Age-related osteoporosis without current pathological fracture: Secondary | ICD-10-CM | POA: Diagnosis not present

## 2022-03-17 DIAGNOSIS — M722 Plantar fascial fibromatosis: Secondary | ICD-10-CM | POA: Diagnosis not present

## 2022-03-17 DIAGNOSIS — I10 Essential (primary) hypertension: Secondary | ICD-10-CM

## 2022-03-17 DIAGNOSIS — D6869 Other thrombophilia: Secondary | ICD-10-CM

## 2022-03-17 DIAGNOSIS — F411 Generalized anxiety disorder: Secondary | ICD-10-CM

## 2022-03-17 NOTE — Telephone Encounter (Signed)
Pharmacy referral

## 2022-03-18 ENCOUNTER — Telehealth: Payer: Self-pay

## 2022-03-18 NOTE — Progress Notes (Signed)
Care Management & Coordination Services Pharmacy Team  Reason for Encounter: Appointment Reminder  Contacted patient to confirm in office appointment with Charlene Brooke , PharmD on 03/21/22 at 9:00.   Appointment just scheduled   Chart review:  Recent office visits:  02/14/22-Marne Tower,MD(PCP)-f/u,UA,new labs 2-3 weeks,stay on metoprolol '25mg'$  1 tablet daily   Recent consult visits:  03/17/22-Mayur Patel,MD(rheu)-f/u PMR,try tylenol in am for hand pain and stiffness.labs, no medication changes, f/u 3 months 02/08/22-Will Camnitz,MD(cardio)-evaluate afib.start toprol xl '50mg'$  ,stop amiodarone,EKG f/u 6 months  12/17/21-  Bone Density test 12/14/21-Mayur Patel,MD(rheu)- labs, stop methotrexate due to Afib,use tylenol for pain and stiffness- referral for dexa scan f/u 3 months  11/30/21-mammogram  11/18/21-Robert Groat-(ophth) no data   Hospital visits:  None in previous 6 months 01/15/22-[facility]-UTI 11/09/21-[facility]-Afib- procedure  Star Rating Drugs:  Medication:  Last Fill: Day Supply No star medications identified  Care Gaps: Annual wellness visit in last year? Yes   Charlene Brooke, PharmD notified  Avel Sensor, [employer] Assistant [phone]

## 2022-03-21 ENCOUNTER — Ambulatory Visit: Payer: Medicare HMO | Admitting: Pharmacist

## 2022-03-21 ENCOUNTER — Telehealth: Payer: Self-pay | Admitting: Pharmacist

## 2022-03-21 NOTE — Progress Notes (Signed)
Care Management & Coordination Services Pharmacy Note  03/21/2022 Name:  Denise Grant MRN:  [id number] DOB:  05-23-1955  Summary: -Afib/HTN: Pt is taking metoprolol tartrate 12.5 mg once daily in AM, she reports BP at home 130s/60s and HR 45-50s through the day and night, she endorses fatigue and is concerned about low HR -Osteoporosis: recent DEXA T-score -2.8, FRAX (hip) 4.9% is high; pt has declined Fosamax (recommended per rheumatology), pt read about concerns for alendronate causing increased heart rate; discussed alendronate is not known for increasing HR/instigating Afib; discussed oral vs injection options to improve bone health/reduce fracture risk, pt will think about this and discuss with PCP further  Recommendations/Changes made from today's visit: -Hold Metoprolol until PCP appt; likely will need additional BP medication - amlodipine is appropriate to use with PRN diltiazem as long as BP/HR is monitored -Consider Fosamax or Prolia to reduce fracture risk   Follow up plan: -Health Concierge monitoring not required -Pharmacist follow up televisit 3 months -PCP appt 03/23/22     Subjective: Denise Grant is an 67 y.o. year old female who is a primary patient of Tower, Wynelle Fanny, MD.  The care coordination team was consulted for assistance with disease management and care coordination needs.    Engaged with patient face to face for initial visit.  Recent office visits: 02/14/22 Dr Glori Bickers OV: f/u - recently put on metorolol succ 50 mg, was trying to split d/t dizziness. Reduced metoprolol to 25 mg. Off amiodarone - will recheck TSH in 2-3 wk. UA negative for UTI, does have contamination  Recent consult visits: 03/17/22 Dr Posey Pronto (Rheumatology): f/u PMR; decrease prednisone by 1 mg every month as tolerated; discussed osteoporosis - fosamax, pt declined  02/08/22 Dr Curt Bears (Cardiology): f/u Afi. In NSR -stop amiodarone. Rx metoprolol succ 50 mg  01/15/22 Urgent Care: UTI - rx  [facility] visits: None in previous 6 months   Objective:  Lab Results  Component Value Date   CREATININE 0.87 10/28/2021   BUN 10 10/28/2021   GFR 64.44 09/02/2021   EGFR 73 10/28/2021   GFRNONAA >60 08/23/2021   GFRAA >60 10/12/2019   NA 140 10/28/2021   K 4.6 10/28/2021   CALCIUM 10.0 10/28/2021   CO2 27 10/28/2021   GLUCOSE 64 (L) 10/28/2021    Lab Results  Component Value Date/Time   GFR 64.44 09/02/2021 07:51 AM   GFR 79.13 08/24/2020 07:42 AM    Last diabetic Eye exam: No results found for: "HMDIABEYEEXA"  Last diabetic Foot exam: No results found for: "HMDIABFOOTEX"   Lab Results  Component Value Date   CHOL 184 09/02/2021   HDL 65.00 09/02/2021   LDLCALC 104 (H) 09/02/2021   TRIG 76.0 09/02/2021   CHOLHDL 3 09/02/2021       Latest Ref Rng & Units 09/02/2021    7:51 AM 08/22/2021   12:15 AM 08/24/2020    7:42 AM  Hepatic Function  Total Protein 6.0 - 8.3 g/dL 6.3  7.9  6.3   Albumin 3.5 - 5.2 g/dL 3.9  4.2  3.8   AST 0 - 37 U/L '19  31  15   '$ ALT 0 - 35 U/L '18  24  17   '$ Alk Phosphatase 39 - 117 U/L 61  64  58   Total Bilirubin 0.2 - 1.2 mg/dL 0.7  0.6  0.5     Lab Results  Component Value Date/Time   TSH 1.79 03/04/2022 08:33 AM   TSH 2.79  09/02/2021 07:51 AM   FREET4 0.85 08/22/2021 12:15 AM   FREET4 1.04 09/19/2018 03:55 PM       Latest Ref Rng & Units 10/28/2021   10:05 AM 09/02/2021    7:51 AM 08/24/2021    4:09 AM  CBC  WBC 3.4 - 10.8 x10E3/uL 7.1  4.2  8.5   Hemoglobin 11.1 - 15.9 g/dL 13.5  12.7  13.5   Hematocrit 34.0 - 46.6 % 40.3  38.5  40.8   Platelets 150 - 450 x10E3/uL 228  211.0  331     Lab Results  Component Value Date/Time   VD25OH 49 04/15/2013 08:16 AM   VD25OH 30 04/13/2012 07:40 AM    Clinical ASCVD: No  The 10-year ASCVD risk score (Arnett DK, et al., 2019) is: 11.5%   Values used to calculate the score:     Age: 67 years     Sex: Female     Is Non-Hispanic African American: No     Diabetic: No      Tobacco smoker: No     Systolic Blood Pressure: [id number] mmHg     Is BP treated: Yes     HDL Cholesterol: 65 mg/dL     Total Cholesterol: 184 mg/dL    CHA2DS2/VAS Stroke Risk Points  Current as of 2 days ago     3 >= 2 Points: High Risk  1 - 1.99 Points: Medium Risk  0 Points: Low Risk    Last Change: N/A      Points Metrics  0 Has Congestive Heart Failure:  No    Current as of 2 days ago  0 Has Vascular Disease:  No    Current as of 2 days ago  1 Has Hypertension:  Yes    Current as of 2 days ago  1 Age:  67    Current as of 2 days ago  0 Has Diabetes:  No    Current as of 2 days ago  0 Had Stroke:  No  Had TIA:  No  Had Thromboembolism:  No    Current as of 2 days ago  1 Female:  Yes    Current as of 2 days ago       02/14/2022   11:08 AM 11/15/2021    2:10 PM 09/06/2021    8:27 AM  Depression screen PHQ 2/9  Decreased Interest 0 0 0  Down, Depressed, Hopeless 0 0 0  PHQ - 2 Score 0 0 0  Altered sleeping 1    Tired, decreased energy 1    Change in appetite 1    Feeling bad or failure about yourself  0    Trouble concentrating 0    Moving slowly or fidgety/restless 0    Suicidal thoughts 0    PHQ-9 Score 3    Difficult doing work/chores Not difficult at all         02/14/2022   11:09 AM  GAD 7 : Generalized Anxiety Score  Nervous, Anxious, on Edge 1  Control/stop worrying 0  Worry too much - different things 1  Trouble relaxing 1  Restless 1  Easily annoyed or irritable 1  Afraid - awful might happen 1  Total GAD 7 Score 6  Anxiety Difficulty Not difficult at all     Social History   Tobacco Use  Smoking Status Former   Types: Cigarettes   Quit date: 01/17/2001   Years since quitting: 21.1   Passive exposure: Past  Smokeless  Tobacco Never  Tobacco Comments   Former smoker 01/13/2021   BP Readings from Last 3 Encounters:  02/14/22 (!) 150/90  02/08/22 (!) 146/76  12/07/21 (!) 162/74   Pulse Readings from Last 3 Encounters:  02/14/22 (!) 57   02/08/22 89  12/07/21 75   Wt Readings from Last 3 Encounters:  02/14/22 140 lb 4 oz (63.6 kg)  02/08/22 136 lb 3.2 oz (61.8 kg)  12/07/21 142 lb 3.2 oz (64.5 kg)   BMI Readings from Last 3 Encounters:  02/14/22 21.97 kg/m  02/08/22 21.33 kg/m  12/07/21 22.27 kg/m    Allergies  Allergen Reactions   Fentanyl Other (See Comments)    Seizure    Buspar [Buspirone]     Felt funny   Calcium     REACTION: cannot tolerate any calcium supplement - GI sympotms   Fluticasone Propionate     REACTION: headache/irritation   Hctz [Hydrochlorothiazide] Other (See Comments)    Eye redness and discomfort/ slt blurred vision    Paroxetine     REACTION: abdominal pain and constipation   Zoloft [Sertraline]     Diarrhea    Hydroxychloroquine Itching and Rash    Medications Reviewed Today     Reviewed by Tammi Sou, CMA (Certified Medical Assistant) on 02/14/22 at 28  Med List Status: <None>   Medication Order Taking? Sig Documenting Provider Last Dose Status Informant  acetaminophen (TYLENOL) 325 MG tablet [id number] Yes Take 650 mg by mouth every 6 (six) hours as needed for moderate pain, mild pain or headache. [provider] Taking Active Self  diltiazem (CARDIZEM) 30 MG tablet [id number] Yes Take 1 tablet every 4 hours AS NEEDED for AFIB HR >100 Fenton, Clint R, PA Taking Active Self  levothyroxine (SYNTHROID) 50 MCG tablet [id number] Yes Take 50 mcg by mouth daily before breakfast. [provider] Taking Active Self  LORazepam (ATIVAN) 1 MG tablet [id number] Yes TAKE 1/2 TABLET BY MOUTH AT BEDTIME AS NEEDED Tower, Wynelle Fanny, MD Taking Active   metoprolol succinate (TOPROL-XL) 50 MG 24 hr tablet [id number] Yes Take 1 tablet (50 mg total) by mouth daily. Take with or immediately following a meal.  Patient taking differently: Take 25 mg by mouth daily. Take with or immediately following a meal.   Camnitz, Will Hassell Done, MD Taking Active   predniSONE (DELTASONE) 1  MG tablet [id number] Yes Take 1 mg by mouth in the morning, at noon, and at bedtime. [provider] Taking Active Self  XARELTO 20 MG TABS tablet [id number] Yes TAKE 1 TABLET BY MOUTH DAILY WITH SUPPER. Fenton, Clint R, PA Taking Active Self            SDOH:  (Social Determinants of Health) assessments and interventions performed: No SDOH Interventions    Flowsheet Row Clinical Support from 11/15/2021 in [place] at [facility] Interventions   Transportation Interventions Intervention Not Indicated  Utilities Interventions Intervention Not Indicated  Social Connections Interventions Intervention Not Indicated       Medication Assistance:  Xarelto - PAP through Afib clinic last approved through 01/16/22  Medication Access: Within the past 30 days, how often has patient missed a dose of medication? 0 Is a pillbox or other method used to improve adherence? Yes  Factors that may affect medication adherence? adverse effects of medications Are meds synced by current pharmacy? No  Are meds delivered by current pharmacy? No  Does patient experience delays in picking up medications due to transportation  concerns? No   [facility] Reviewed: Is patient disadvantaged to use [facility]?: Yes  Current Rx insurance plan: [employer] Name and location of Current pharmacy[facility]  [id number] [place] [place]Phone: [phone]: [phone] [id number] [place], [place]: [phone]: [phone] services reviewed with patient today?: No  Patient requests to transfer care to [facility]?: No  Reason patient declined to change pharmacies: Disadvantaged due to insurance/mail order  Compliance/Adherence/Medication fill history: Care Gaps: None  Star-Rating  Drugs: None   Assessment/Plan   Hypertension (BP goal <130/80) -Not ideally controlled - BP 147/78, HR 54 today in office; pt concerned about low HR at home (45-50s) with fatigue; she recently did not start amlodipine due to concern for interaction with diltiazem, which she has not used recently -Current home BP readings: 130s/70s -Current treatment: Metoprolol tartrate 12.5 mg once daily - Appropriate, Effective, Query Safe -Medications previously tried: amlodipine, HCTZ (redness), metoprolol -Educated on BP goals and benefits of medications for prevention of heart attack, stroke and kidney damage; Importance of home blood pressure monitoring; Proper BP monitoring technique; -Counseled to monitor BP at home daily, document, and provide log at future appointments -Reviewed ability to take amlodipine and diltiazem together - given low dose and infrequent use of diltiazem, the risk for adverse effects of coadministration (low BP) is very low -Recommend to hold metoprolol until f/u with PCP this week; consider amlodipine if further BP control is needed  Atrial Fibrillation (Goal: prevent stroke and major bleeding) -Not ideally controlled - pt concerned about low HR at home (45-50s) with fatigue; she has not had to use diltiazem since before ablation in Oct 2023 -s/p ablation 10/2021; CHADSVASC: 3 -Current home readings: BP 130s/70s; HR 45-50s -Current treatment: Metoprolol tartrate 12.5 mg once daily - Appropriate, Effective, Query Safe Diltiazem 30 mg PRN HR > 100 - Appropriate, Effective, Safe, Accessible Xarelto 20 mg daily -Medications previously tried: amiodarone, flecainide, metoprolol -Counseled on increased risk of stroke due to Afib and benefits of anticoagulation for stroke prevention; importance of adherence to anticoagulant exactly as prescribed; bleeding risk associated with Xarelto and importance of self-monitoring for signs/symptoms of bleeding; seeking medical attention after  a head injury or if there is blood in the urine/stool; -Recommend to hold metoprolol until f/u with PCP this week; consider amlodipine if further BP control is needed  Depression/Anxiety (Goal: manage symptoms) -Controlled - pt reports she takes Lorazepam most nights to help with sleep; she usually gets 6-7 hrs of sleep and denies daytime sleepiness; she has been on lorazepam for over 15 years -PHQ9: 3 (01/2022) -GAD7: 6 (01/2022) -Connected with PCP for mental health support -Current treatment: Lorazepam 1 mg -1/2 tab HS PRN - Appropriate, Effective, Safe, Accessible -Medications previously tried/failed: paroxetine (constipation), sertraline (diarrhea), buspar (felt funny) -Recommended to continue current medication  Hypothyroidism (Goal: TSH wnl) -Controlled - TSH 1.79 (02/2022) after being off amiodarone about a month; she separates levothyroxine from other meds, takes 30 min before getting out of bed -Current treatment  Levothyroxine 50 mcg daily - Appropriate, Effective, Safe, Accessible -Medications previously tried: n/a  -Recommended to continue current medication  Polymyalgia rheumatica (Goal: manage pain) -Controlled - follows with rheumatology; she has multiple intolerances to previouds meds and is slowly tapering prednisone -Current treatment  Prednisone 1 mg  2-3 x daily - Appropriate, Effective, Safe, Accessible -Medications previously tried:  plaquenil (rash), MTX (afib), leflunomide  -Reviewed long term risks of prednisone including low bone density and fractures - see osteoporosis section -Recommended to continue current medication  Osteoporosis (Goal prevent fractures) -Uncontrolled - recent repeat DEXA scan established osteoporosis dx; pt has declined fosamax due to concerns that it will increase heart rate (she read this somewhere) -Last DEXA Scan: 12/17/21   T-Score femoral neck: -2.4  T-score total hip: - 2.8  T-Score lumbar spine: -2.3  T-Score forearm radius:  -1.5  10-year probability of major osteoporotic fracture: 20%  10-year probability of hip fracture: 4.9% -Patient is a candidate for pharmacologic treatment due to T-Score < -2.5 in total hip  -Current treatment  None -Medications previously tried: none  -Recommend weight-bearing and muscle strengthening exercises for building and maintaining bone density. -Reviewed osteoporosis risk for fracture and downstream consequences if she were to have a fracture, including loss of independence; reviewed alendronate side effects, tachycardia/Afib is not listed among known side effects; some retrospective observational studies have suggested bisphosphonates may be associated with increased risk of Afib; also discussed Prolia as alternative, pt will think about options, risks/benefits, and discuss further with PCP  Health Maintenance -Vaccine gaps: Shingrix, Covid booster -Hx Migraine -Pt has noticed some hair thinning and asked about supplements to help; advised trial of hair/skin/nails supplement, separate from levothyroxine by 4 hours   Charlene Brooke, PharmD, BCACP Clinical Pharmacist [facility] at [facility] [phone]

## 2022-03-21 NOTE — Telephone Encounter (Signed)
Afib/HTN: Pt is taking metoprolol tartrate 12.5 mg once daily in AM, she reports BP at home 130s/60s and HR 45-50s through the day and night (she wears a smart watch that constantly updates HR), she endorses fatigue and is concerned about low HR.  BP Readings from Last 3 Encounters:  03/21/22 (!) 147/78  02/14/22 (!) 150/90  02/08/22 (!) 146/76   Pulse Readings from Last 3 Encounters:  03/21/22 (!) 54  02/14/22 (!) 57  02/08/22 89    Osteoporosis: recent DEXA T-score -2.8, FRAX (hip) 4.9% is high; pt has declined Fosamax (recommended per rheumatology), pt read about concerns for alendronate causing increased heart rate; reviewed alendronate side effects, tachycardia/Afib is not listed among known side effects; some retrospective observational studies have suggested bisphosphonates may be associated with increased risk of Afib; discussed fosamax vs Prolia as options to improve bone health/reduce fracture risk, pt will think about this and discuss with PCP further  Recommendations -Hold Metoprolol until PCP appt 03/23/22; likely will need additional BP medication - amlodipine is appropriate to use with PRN diltiazem as long as BP/HR is monitored -Consider Fosamax or Prolia for bone health once BP/HR issues resolve

## 2022-03-21 NOTE — Patient Instructions (Signed)
Visit Information  Phone number for Pharmacist: [phone]  Thank you for meeting with me to discuss your medications! Below is a summary of what we talked about during the visit:   Recommendations/Changes made from today's visit: -Hold Metoprolol until PCP appt; likely will need additional BP medication - amlodipine is appropriate to use with PRN diltiazem as long as BP/HR is monitored -Consider Fosamax or Prolia to reduce fracture risk   Follow up plan: -Health Concierge monitoring not required -Pharmacist follow up televisit 3 months -PCP appt 03/23/22   Charlene Brooke, PharmD, BCACP Clinical Pharmacist [facility] at [facility] [phone]

## 2022-03-23 ENCOUNTER — Ambulatory Visit (INDEPENDENT_AMBULATORY_CARE_PROVIDER_SITE_OTHER): Payer: Medicare HMO | Admitting: Family Medicine

## 2022-03-23 ENCOUNTER — Encounter: Payer: Self-pay | Admitting: Family Medicine

## 2022-03-23 VITALS — BP 126/76 | HR 60 | Temp 97.4°F | Ht 67.0 in | Wt 140.0 lb

## 2022-03-23 DIAGNOSIS — M8580 Other specified disorders of bone density and structure, unspecified site: Secondary | ICD-10-CM | POA: Diagnosis not present

## 2022-03-23 DIAGNOSIS — I4891 Unspecified atrial fibrillation: Secondary | ICD-10-CM | POA: Diagnosis not present

## 2022-03-23 DIAGNOSIS — M353 Polymyalgia rheumatica: Secondary | ICD-10-CM

## 2022-03-23 DIAGNOSIS — I10 Essential (primary) hypertension: Secondary | ICD-10-CM | POA: Diagnosis not present

## 2022-03-23 NOTE — Assessment & Plan Note (Addendum)
Bp and pulse are stable off metoprolol now BP: 126/76  Pulse Rate: 60   Had bradycardia and sympt on metop If bp goes up more consider amlodipine (takes diltiazem prn and has not needed) Pharmacist indicated it would be ok to add amlodipine if needed  For now seems ok w/o it  Reviewed pharmacy and cardiology notes today as well as labs

## 2022-03-23 NOTE — Assessment & Plan Note (Signed)
Significantrev last dexa  Disc tx options  Pt is afraid of a fib risk with alendronate- rev pharm note on that  ? If some retrospective obs studies noted it Doubt ins would cover prolia  She will check on this  Disc with her rheumatologist also   Disc fall prev Enc vit D and exercise as tol  Handouts given both meds Fx risk inc due to chronic low dose pred for pmr

## 2022-03-23 NOTE — Patient Instructions (Addendum)
Call your insurance co and see if Prolia for osteoporosis is covered  Discuss it more with your rheumatologist      Stay off the metoprolol for now  BP and pulse are good today   Monitor at home If BP gets hight  If pulse stays high or low or you have a fib symptoms let us know   Continue vitamin D Get calcium from diet  Exercise  Do some strength training and also walk

## 2022-03-23 NOTE — Assessment & Plan Note (Signed)
Doing better  On 1 mg prednisone daily  This is chronic   Plans to disc OP further with rheum

## 2022-03-23 NOTE — Progress Notes (Signed)
Subjective:    Patient ID: Denise Grant, female    DOB: 16-Dec-1955, 67 y.o.   MRN: [id number]  HPI Pt presents to discuss HTN and dizziness/bradycardia  Also OP  Wt Readings from Last 3 Encounters:  03/23/22 140 lb (63.5 kg)  02/14/22 140 lb 4 oz (63.6 kg)  02/08/22 136 lb 3.2 oz (61.8 kg)   21.93 kg/m  Vitals:   03/23/22 1123  BP: 126/76  Pulse: 60  Temp: (!) 97.4 F (36.3 C)  SpO2: 99%     HTN In setting of a fib/under card care  BP Readings from Last 3 Encounters:  03/23/22 126/76  03/21/22 (!) 147/78  02/14/22 (!) 150/90   Pulse Readings from Last 3 Encounters:  03/23/22 60  03/21/22 (!) 54  02/14/22 (!) 57   Fine off the medicine  Energy level is good now   Played 18 holes of golf yesterday   Metoprolol xl 25 mg daily  Holding now after HR was in 50s (day) and 40s) night    Was seen by pharmacist Ria Comment Noted that her bp was high and pulse too low Causeing dizziness  Noted we could add amlodipine even with prn diltiazem is monitored closely   For OP disc alendronate vs prlia  She was worried about alendronate causing a fib- this is not listed as a side eff but there are some retrospective obs studies assoc the 2   No recent falls or fractures    Last A/P from cardiology from late Jan ASSESSMENT AND PLAN:   1.  Paroxysmal atrial fibrillation: Currently on Xarelto 20 mg daily.  CHA2DS2-VASc 3.  Status post ablation 11/09/2021.  She is currently feeling well without major complaint.  She did feel like she potentially had an episode of atrial fibrillation over the weekend, but her wearable monitor shows sinus rhythm.  As she is remained in sinus rhythm, will plan to stop amiodarone.   2.  Secondary hypercoagulable state: Currently on Xarelto for atrial fibrillation above   3.  Hypertension: Blood pressure is elevated today.  Will start Toprol-XL 50 mg daily.   Hypothyroidism  Pt has no clinical changes No change in energy level/ hair or  skin/ edema and no tremor Lab Results  Component Value Date   TSH 1.79 03/04/2022    Levothy 50 mcg daily   Doing well with PMR Down to 1 mg prednisone daily  Able to do more   Patient Active Problem List   Diagnosis Date Noted   History of UTI 02/14/2022   Abnormal urinalysis 02/14/2022   Encounter for screening mammogram for breast cancer 09/06/2021   PMR (polymyalgia rheumatica) ([facility]) 09/06/2021   Atrial fibrillation with RVR ([facility]) 08/22/2021   Abnormal x-ray of knee 08/17/2021   Effusion of right knee 08/17/2021   Osteopenia of right lower leg 08/17/2021   Acute pain of right knee 08/16/2021   Pain and swelling of right lower leg 08/16/2021   Posterior knee pain, right 08/16/2021   Rheumatoid factor positive 08/28/2020   Myalgia 08/25/2020   Secondary hypercoagulable state ([facility]) 07/05/2019   Lumbar disc disease    Bilateral bunions    Paroxysmal atrial fibrillation ([facility]) 09/27/2018   Essential hypertension 01/01/2015   Hypothyroidism 06/25/2014   Migraine with vertigo 05/21/2014   History of Chiari malformation 05/21/2014   Atrophic vaginitis 05/01/2013   Routine general medical examination at a health care facility 11/18/2010   INSOMNIA 01/27/2010   HEADACHE 03/31/2008   Osteopenia 09/18/2007  Generalized anxiety disorder 05/05/2006   MITRAL VALVE PROLAPSE 05/05/2006   ALLERGIC RHINITIS 05/05/2006   FIBROCYSTIC BREAST DISEASE 05/05/2006   SEBORRHEIC DERMATITIS 05/05/2006   KERATOSIS, ACTINIC 05/05/2006   NECK PAIN, CHRONIC 05/05/2006   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.0)    Osteopenia    Seborrheic dermatitis, unspecified    Vaginal cyst    stable   Past Surgical History:  Procedure  Laterality Date   AIR/FLUID EXCHANGE Right 07/25/2018   Procedure: Air/Fluid Exchange;  Surgeon: Jalene Mullet, MD;  Location: [facility];  Service: Ophthalmology;  Laterality: Right;   ATRIAL FIBRILLATION ABLATION N/A 11/09/2021   Procedure: ATRIAL FIBRILLATION ABLATION;  Surgeon: Constance Haw, MD;  Location: [facility];  Service: Cardiovascular;  Laterality: N/A;   bone spur removal  10/06   shoulder   CATARACT EXTRACTION, BILATERAL Bilateral 2016   CERVICAL [place] SURGERY  2005   Chiari Malformation surgery  6/03   EXCISION MORTON'S NEUROMA  2/04   GAS INSERTION Right 07/25/2018   Procedure: Insertion Of Gas SF6;  Surgeon: Jalene Mullet, MD;  Location: [facility];  Service: Ophthalmology;  Laterality: Right;   LUMBAR DISC SURGERY     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   Procedure: REPAIR OF COMPLEX TRACTION RETINAL DETACHMENT - 25GA VITRECTOMY, ENDOLASER, DRAINAGE OF SUBRETINAL FLUID;  Surgeon: Jalene Mullet, MD;  Location: [facility];  Service: Ophthalmology;  Laterality: Right;   TUBAL LIGATION     Social History   Tobacco Use   Smoking status: Former    Types: Cigarettes    Quit date: 01/17/2001    Years since quitting: 21.1    Passive exposure: Past   Smokeless tobacco: Never   Tobacco comments:    Former smoker 01/13/2021  Substance Use Topics   Alcohol use: No    Alcohol/week: 0.0 standard drinks of alcohol   Drug use: No   Family History  Problem Relation Age of Onset   Melanoma Sister    Lung cancer Father    Coronary artery disease Mother    Heart failure Mother    Hypertension Mother    Anxiety disorder Son    Lung cancer Brother        + smoker   Breast cancer Neg Hx    Allergies  Allergen Reactions   Fentanyl Other (See Comments)    Seizure    Buspar [Buspirone]     Felt funny   Calcium     REACTION: cannot tolerate any calcium supplement - GI sympotms   Fluticasone Propionate     REACTION: headache/irritation   Hctz  [Hydrochlorothiazide] Other (See Comments)    Eye redness and discomfort/ slt blurred vision    Metoprolol     Low pulse and dizziness    Paroxetine     REACTION: abdominal pain and constipation   Zoloft [Sertraline]     Diarrhea    Hydroxychloroquine Itching and Rash   Current Outpatient Medications on File Prior to Visit  Medication Sig Dispense Refill   acetaminophen (TYLENOL) 325 MG tablet Take 650 mg by mouth every 6 (six) hours as needed for moderate pain, mild pain or headache.  diltiazem (CARDIZEM) 30 MG tablet Take 1 tablet every 4 hours AS NEEDED for AFIB HR >100 30 tablet 1   levothyroxine (SYNTHROID) 50 MCG tablet Take 50 mcg by mouth daily before breakfast.     LORazepam (ATIVAN) 1 MG tablet TAKE 1/2 TABLET BY MOUTH AT BEDTIME AS NEEDED 15 tablet 3   predniSONE (DELTASONE) 1 MG tablet Take 1 mg by mouth. 2-3 times daily     XARELTO 20 MG TABS tablet TAKE 1 TABLET BY MOUTH DAILY WITH SUPPER. 90 tablet 2   No current facility-administered medications on file prior to visit.    Review of Systems  Constitutional:  Positive for fatigue. Negative for activity change, appetite change, fever and unexpected weight change.       Fatigue is improving now   HENT:  Negative for congestion, ear pain, rhinorrhea, sinus pressure and sore throat.   Eyes:  Negative for pain, redness and visual disturbance.  Respiratory:  Negative for cough, shortness of breath and wheezing.   Cardiovascular:  Negative for chest pain and palpitations.  Gastrointestinal:  Negative for abdominal pain, blood in stool, constipation and diarrhea.  Endocrine: Negative for polydipsia and polyuria.  Genitourinary:  Negative for dysuria, frequency and urgency.  Musculoskeletal:  Negative for arthralgias, back pain and myalgias.  Skin:  Negative for pallor and rash.  Allergic/Immunologic: Negative for environmental allergies.  Neurological:  Negative for dizziness, syncope and headaches.       No longer dizzy   Hematological:  Negative for adenopathy. Does not bruise/bleed easily.  Psychiatric/Behavioral:  Negative for decreased concentration and dysphoric mood. The patient is not nervous/anxious.        Objective:   Physical Exam Constitutional:      General: She is not in acute distress.    Appearance: Normal appearance. She is well-developed and normal weight. She is not ill-appearing or diaphoretic.  HENT:     Head: Normocephalic and atraumatic.     Mouth/Throat:     Mouth: Mucous membranes are moist.  Eyes:     General: No scleral icterus.    Conjunctiva/sclera: Conjunctivae normal.     Pupils: Pupils are equal, round, and reactive to light.  Neck:     Thyroid: No thyromegaly.     Vascular: No carotid bruit or JVD.  Cardiovascular:     Rate and Rhythm: Normal rate and regular rhythm.     Heart sounds: Normal heart sounds.     No gallop.  Pulmonary:     Effort: Pulmonary effort is normal. No respiratory distress.     Breath sounds: Normal breath sounds. No stridor. No wheezing or rales.     Comments: No crackles Abdominal:     General: There is no distension or abdominal bruit.     Palpations: Abdomen is soft.  Musculoskeletal:     Cervical back: Normal range of motion and neck supple.     Right lower leg: No edema.     Left lower leg: No edema.  Lymphadenopathy:     Cervical: No cervical adenopathy.  Skin:    General: Skin is warm and dry.     Coloration: Skin is not pale.     Findings: No rash.  Neurological:     Mental Status: She is alert.     Coordination: Coordination normal.     Deep Tendon Reflexes: Reflexes are normal and symmetric. Reflexes normal.  Psychiatric:        Mood and Affect: Mood normal.  Assessment & Plan:   Problem List Items Addressed This Visit       Cardiovascular and Mediastinum   Atrial fibrillation with RVR ([facility])    Still in nsr  Nl rat off metoprolol      Essential hypertension - Primary    Bp and pulse are  stable off metoprolol now BP: 126/76  Pulse Rate: 60   Had bradycardia and sympt on metop If bp goes up more consider amlodipine (takes diltiazem prn and has not needed) Pharmacist indicated it would be ok to add amlodipine if needed  For now seems ok w/o it  Reviewed pharmacy and cardiology notes today as well as labs         Musculoskeletal and Integument   Osteopenia    Significantrev last dexa  Disc tx options  Pt is afraid of a fib risk with alendronate- rev pharm note on that  ? If some retrospective obs studies noted it Doubt ins would cover prolia  She will check on this  Disc with her rheumatologist also   Disc fall prev Enc vit D and exercise as tol  Handouts given both meds Fx risk inc due to chronic low dose pred for pmr        Other   PMR (polymyalgia rheumatica) ([facility])    Doing better  On 1 mg prednisone daily  This is chronic   Plans to disc OP further with rheum

## 2022-03-23 NOTE — Assessment & Plan Note (Signed)
Still in nsr  Nl rat off metoprolol

## 2022-04-13 ENCOUNTER — Ambulatory Visit: Payer: Medicare HMO | Admitting: Dermatology

## 2022-04-13 ENCOUNTER — Encounter: Payer: Self-pay | Admitting: Dermatology

## 2022-04-13 VITALS — BP 141/76 | HR 68

## 2022-04-13 DIAGNOSIS — Z1283 Encounter for screening for malignant neoplasm of skin: Secondary | ICD-10-CM | POA: Diagnosis not present

## 2022-04-13 DIAGNOSIS — L578 Other skin changes due to chronic exposure to nonionizing radiation: Secondary | ICD-10-CM | POA: Diagnosis not present

## 2022-04-13 DIAGNOSIS — L719 Rosacea, unspecified: Secondary | ICD-10-CM | POA: Diagnosis not present

## 2022-04-13 DIAGNOSIS — D692 Other nonthrombocytopenic purpura: Secondary | ICD-10-CM

## 2022-04-13 DIAGNOSIS — D1801 Hemangioma of skin and subcutaneous tissue: Secondary | ICD-10-CM

## 2022-04-13 DIAGNOSIS — L570 Actinic keratosis: Secondary | ICD-10-CM

## 2022-04-13 DIAGNOSIS — L814 Other melanin hyperpigmentation: Secondary | ICD-10-CM | POA: Diagnosis not present

## 2022-04-13 DIAGNOSIS — Z79899 Other long term (current) drug therapy: Secondary | ICD-10-CM | POA: Diagnosis not present

## 2022-04-13 DIAGNOSIS — D229 Melanocytic nevi, unspecified: Secondary | ICD-10-CM | POA: Diagnosis not present

## 2022-04-13 DIAGNOSIS — L821 Other seborrheic keratosis: Secondary | ICD-10-CM

## 2022-04-13 MED ORDER — RHOFADE 1 % EX CREA: 1.0000 | TOPICAL_CREAM | Freq: Every morning | CUTANEOUS | 6 refills | Status: DC

## 2022-04-13 NOTE — Progress Notes (Signed)
Follow-Up Visit   Subjective  Denise Grant is a 68 y.o. female who presents for the following: Skin Cancer Screening and Full Body Skin Exam, hx of Aks, check spots face, dry and scaly, redness on nose, ~15yr The patient presents for Total-Body Skin Exam (TBSE) for skin cancer screening and mole check. The patient has spots, moles and lesions to be evaluated, some may be new or changing and the patient has concerns that these could be cancer.  The following portions of the chart were reviewed this encounter and updated as appropriate: medications, allergies, medical history  Review of Systems:  No other skin or systemic complaints except as noted in HPI or Assessment and Plan.  Objective  Well appearing patient in no apparent distress; mood and affect are within normal limits.  A full examination was performed including scalp, head, eyes, ears, nose, lips, neck, chest, axillae, abdomen, back, buttocks, bilateral upper extremities, bilateral lower extremities, hands, feet, fingers, toes, fingernails, and toenails. All findings within normal limits unless otherwise noted below.   Relevant physical exam findings are noted in the Assessment and Plan.   Assessment & Plan   LENTIGINES, SEBORRHEIC KERATOSES, HEMANGIOMAS - Benign normal skin lesions - Benign-appearing - Call for any changes  MELANOCYTIC NEVI - Tan-brown and/or pink-flesh-colored symmetric macules and papules - Benign appearing on exam today - Observation - Call clinic for new or changing moles - Recommend daily use of broad spectrum spf 30+ sunscreen to sun-exposed areas.   ACTINIC DAMAGE - Chronic condition, secondary to cumulative UV/sun exposure - diffuse scaly erythematous macules with underlying dyspigmentation - Recommend daily broad spectrum sunscreen SPF 30+ to sun-exposed areas, reapply every 2 hours as needed.  - Staying in the shade or wearing long sleeves, sun glasses (UVA+UVB protection) and wide brim  hats (4-inch brim around the entire circumference of the hat) are also recommended for sun protection.  - Call for new or changing lesions.  SKIN CANCER SCREENING PERFORMED TODAY.  ACTINIC KERATOSIS Exam: Erythematous thin papules/macules with gritty scale  Actinic keratoses are precancerous spots that appear secondary to cumulative UV radiation exposure/sun exposure over time. They are chronic with expected duration over 1 year. A portion of actinic keratoses will progress to squamous cell carcinoma of the skin. It is not possible to reliably predict which spots will progress to skin cancer and so treatment is recommended to prevent development of skin cancer.  Recommend daily broad spectrum sunscreen SPF 30+ to sun-exposed areas, reapply every 2 hours as needed.  Recommend staying in the shade or wearing long sleeves, sun glasses (UVA+UVB protection) and wide brim hats (4-inch brim around the entire circumference of the hat). Call for new or changing lesions.  Treatment Plan: Prior to procedure, discussed risks of blister formation, small wound, skin dyspigmentation, or rare scar following cryotherapy. Recommend Vaseline ointment to treated areas while healing.  Destruction Procedure Note Destruction method: cryotherapy   Informed consent: discussed and consent obtained   Lesion destroyed using liquid nitrogen: Yes   Outcome: patient tolerated procedure well with no complications   Post-procedure details: wound care instructions given   Locations: face x 5 # of Lesions Treated: 5   Purpura - Chronic; persistent and recurrent.  Treatable, but not curable. - Violaceous macules and patches - Benign - Related to trauma, age, sun damage and/or use of blood thinners, chronic use of topical and/or oral steroids - Observe - Can use OTC arnica containing moisturizer such as Dermend Bruise Formula if desired -  Call for worsening or other concerns   ROSACEA Exam Mild erythema nose Rosacea  is a chronic progressive skin condition usually affecting the face of adults, causing redness and/or acne bumps. It is treatable but not curable. It sometimes affects the eyes (ocular rosacea) as well. It may respond to topical and/or systemic medication and can flare with stress, sun exposure, alcohol, exercise, topical steroids (including hydrocortisone/cortisone 10) and some foods.  Daily application of broad spectrum spf 30+ sunscreen to face is recommended to reduce flares.  Treatment Plan: Start Rhofade qam to face   Return in about 1 year (around 04/13/2023) for TBSE, Hx of AKs.  I, Othelia Pulling, RMA, am acting as scribe for Sarina Ser, MD .  Documentation: I have reviewed the above documentation for accuracy and completeness, and I agree with the above.  Sarina Ser, MD

## 2022-04-13 NOTE — Patient Instructions (Signed)
Cryotherapy Aftercare  Wash gently with soap and water everyday.   Apply Vaseline and Band-Aid daily until healed.     Due to recent changes in healthcare laws, you may see results of your pathology and/or laboratory studies on MyChart before the doctors have had a chance to review them. We understand that in some cases there may be results that are confusing or concerning to you. Please understand that not all results are received at the same time and often the doctors may need to interpret multiple results in order to provide you with the best plan of care or course of treatment. Therefore, we ask that you please give us 2 business days to thoroughly review all your results before contacting the office for clarification. Should we see a critical lab result, you will be contacted sooner.   If You Need Anything After Your Visit  If you have any questions or concerns for your doctor, please call our main line at [phone] and press option 4 to reach your doctor's medical assistant. If no one answers, please leave a voicemail as directed and we will return your call as soon as possible. Messages left after 4 pm will be answered the following business day.   You may also send us a message via MyChart. We typically respond to MyChart messages within 1-2 business days.  For prescription refills, please ask your pharmacy to contact our office. Our fax number is [phone].  If you have an urgent issue when the clinic is closed that cannot wait until the next business day, you can page your doctor at the number below.    Please note that while we do our best to be available for urgent issues outside of office hours, we are not available 24/7.   If you have an urgent issue and are unable to reach us, you may choose to seek medical care at your doctor's office, retail clinic, urgent care center, or emergency room.  If you have a medical emergency, please immediately call 911 or go to the  emergency department.  Pager Numbers  - Dr. Kowalski: [phone]  - Dr. Moye: [phone]  - Dr. Stewart: [phone]  In the event of inclement weather, please call our main line at [phone] for an update on the status of any delays or closures.  Dermatology Medication Tips: Please keep the boxes that topical medications come in in order to help keep track of the instructions about where and how to use these. Pharmacies typically print the medication instructions only on the boxes and not directly on the medication tubes.   If your medication is too expensive, please contact our office at [phone] option 4 or send us a message through MyChart.   We are unable to tell what your co-pay for medications will be in advance as this is different depending on your insurance coverage. However, we may be able to find a substitute medication at lower cost or fill out paperwork to get insurance to cover a needed medication.   If a prior authorization is required to get your medication covered by your insurance company, please allow us 1-2 business days to complete this process.  Drug prices often vary depending on where the prescription is filled and some pharmacies may offer cheaper prices.  The website www.goodrx.com contains coupons for medications through different pharmacies. The prices here do not account for what the cost may be with help from insurance (it may be cheaper with your insurance), but the website can   give you the price if you did not use any insurance.  - You can print the associated coupon and take it with your prescription to the pharmacy.  - You may also stop by our office during regular business hours and pick up a GoodRx coupon card.  - If you need your prescription sent electronically to a different pharmacy, notify our office through [facility] MyChart or by phone at [phone] option 4.     Si Usted Necesita Algo Despus de Su Visita  Tambin puede  enviarnos un mensaje a travs de MyChart. Por lo general respondemos a los mensajes de MyChart en el transcurso de 1 a 2 das hbiles.  Para renovar recetas, por favor pida a su farmacia que se ponga en contacto con nuestra oficina. Nuestro nmero de fax es el [phone].  Si tiene un asunto urgente cuando la clnica est cerrada y que no puede esperar hasta el siguiente da hbil, puede llamar/localizar a su doctor(a) al nmero que aparece a continuacin.   Por favor, tenga en cuenta que aunque hacemos todo lo posible para estar disponibles para asuntos urgentes fuera del horario de oficina, no estamos disponibles las 24 horas del da, los 7 das de la semana.   Si tiene un problema urgente y no puede comunicarse con nosotros, puede optar por buscar atencin mdica  en el consultorio de su doctor(a), en una clnica privada, en un centro de atencin urgente o en una sala de emergencias.  Si tiene una emergencia mdica, por favor llame inmediatamente al 911 o vaya a la sala de emergencias.  Nmeros de bper  - Dr. Kowalski: [phone]  - Dra. Moye: [phone]  - Dra. Stewart: [phone]  En caso de inclemencias del tiempo, por favor llame a nuestra lnea principal al [phone] para una actualizacin sobre el estado de cualquier retraso o cierre.  Consejos para la medicacin en dermatologa: Por favor, guarde las cajas en las que vienen los medicamentos de uso tpico para ayudarle a seguir las instrucciones sobre dnde y cmo usarlos. Las farmacias generalmente imprimen las instrucciones del medicamento slo en las cajas y no directamente en los tubos del medicamento.   Si su medicamento es muy caro, por favor, pngase en contacto con nuestra oficina llamando al [phone] y presione la opcin 4 o envenos un mensaje a travs de MyChart.   No podemos decirle cul ser su copago por los medicamentos por adelantado ya que esto es diferente dependiendo de la cobertura de su seguro.  Sin embargo, es posible que podamos encontrar un medicamento sustituto a menor costo o llenar un formulario para que el seguro cubra el medicamento que se considera necesario.   Si se requiere una autorizacin previa para que su compaa de seguros cubra su medicamento, por favor permtanos de 1 a 2 das hbiles para completar este proceso.  Los precios de los medicamentos varan con frecuencia dependiendo del lugar de dnde se surte la receta y alguna farmacias pueden ofrecer precios ms baratos.  El sitio web www.goodrx.com tiene cupones para medicamentos de diferentes farmacias. Los precios aqu no tienen en cuenta lo que podra costar con la ayuda del seguro (puede ser ms barato con su seguro), pero el sitio web puede darle el precio si no utiliz ningn seguro.  - Puede imprimir el cupn correspondiente y llevarlo con su receta a la farmacia.  - Tambin puede pasar por nuestra oficina durante el horario de atencin regular y recoger una tarjeta de cupones de GoodRx.  -   Si necesita que su receta se enve electrnicamente a una farmacia diferente, informe a nuestra oficina a travs de MyChart de [facility] o por telfono llamando al [phone] y presione la opcin 4.  

## 2022-04-21 ENCOUNTER — Other Ambulatory Visit: Payer: Self-pay | Admitting: Family Medicine

## 2022-05-12 ENCOUNTER — Other Ambulatory Visit (HOSPITAL_COMMUNITY): Payer: Self-pay | Admitting: *Deleted

## 2022-05-12 MED ORDER — RIVAROXABAN 20 MG PO TABS: 20.0000 mg | ORAL_TABLET | Freq: Every day | ORAL | 2 refills | Status: DC

## 2022-06-17 ENCOUNTER — Other Ambulatory Visit: Payer: Self-pay | Admitting: Family Medicine

## 2022-06-17 NOTE — Telephone Encounter (Signed)
Last filled 02-11-22 #15 Last OV 03-23-22 No Future OV [facility]

## 2022-06-20 ENCOUNTER — Telehealth: Payer: Self-pay

## 2022-06-20 NOTE — Progress Notes (Signed)
Care Management & Coordination Services Pharmacy Team  Reason for Encounter: Appointment Reminder  Contacted patient to confirm telephone appointment with Al Corpus , PharmD on 06/23/22 at 10:00. Spoke with patient on 06/20/2022   Do you have any problems getting your medications? No  What is your top health concern you would like to discuss at your upcoming visit?   HR  up and down   Have you seen any other providers since your last visit with PCP? Yes  Rheumatology   Chart review:   Hospital visits:  None in previous 6 months   Star Rating Drugs:  Medication:  Last Fill: Day Supply No star medications identified   Care Gaps: Annual wellness visit in last year? Yes  Al Corpus, PharmD notified  Burt Knack, [facility] Clinical Pharmacy Assistant [phone]

## 2022-06-23 ENCOUNTER — Ambulatory Visit: Payer: Medicare HMO | Admitting: Pharmacist

## 2022-06-23 NOTE — Progress Notes (Signed)
Care Management & Coordination Services Pharmacy Note  06/23/2022 Name:  Denise Grant MRN:  [id number] DOB:  1955-09-27  Summary: F/U visit -Afib/HTN: Pt is off metoprolol and reports HR improved to 60-70s, she feels more energized; BP at goal 115-120s/60s at home -Osteoporosis: 12/2021 DEXA T-score -2.8, FRAX (hip) 4.9% is high; discussed fracture risk at length and recommended pharmacological treatment, patient declined; she does report walking 1-2 miles per day and lifting arm weights  Recommendations/Changes made from today's visit: -No med changes -Advised to revisit osteoporosis treatment periodically; repeat DEXA 12/2023  Follow up plan: -Health Concierge monitoring not necessary -Pharmacist follow up 1 year -PCP CPE 8/22/024    Subjective: Denise Grant is an 67 y.o. year old female who is a primary patient of Tower, Audrie Gallus, MD.  The care coordination team was consulted for assistance with disease management and care coordination needs.    Engaged with patient by telephone for follow up visit.  Recent office visits: 03/23/22 Dr Milinda Antis OV: BP 126/76, HR 60 off metoprolol. D/C metoprolol. Advised to call insurance about Prolia coverage, discuss osteop tx with rheumatologist.  02/14/22 Dr Milinda Antis OV: f/u - recently put on metorolol succ 50 mg, was trying to split d/t dizziness. Reduced metoprolol to 25 mg. Off amiodarone - will recheck TSH in 2-3 wk. UA negative for UTI, does have contamination  Recent consult visits: 03/17/22 Dr Allena Katz (Rheumatology): f/u PMR; decrease prednisone by 1 mg every month as tolerated; discussed osteoporosis - fosamax, pt declined  02/08/22 Dr Elberta Fortis (Cardiology): f/u Afi. In NSR -stop amiodarone. Rx metoprolol succ 50 mg  01/15/22 Urgent Care: UTI - rx macrobid  12/ Hospital visits: None in previous 6 months   Objective:  Lab Results  Component Value Date   CREATININE 0.87 10/28/2021   BUN 10 10/28/2021   GFR 64.44 09/02/2021   EGFR 73  10/28/2021   GFRNONAA >60 08/23/2021   GFRAA >60 10/12/2019   NA 140 10/28/2021   K 4.6 10/28/2021   CALCIUM 10.0 10/28/2021   CO2 27 10/28/2021   GLUCOSE 64 (L) 10/28/2021    Lab Results  Component Value Date/Time   GFR 64.44 09/02/2021 07:51 AM   GFR 79.13 08/24/2020 07:42 AM    Last diabetic Eye exam: No results found for: "HMDIABEYEEXA"  Last diabetic Foot exam: No results found for: "HMDIABFOOTEX"   Lab Results  Component Value Date   CHOL 184 09/02/2021   HDL 65.00 09/02/2021   LDLCALC 104 (H) 09/02/2021   TRIG 76.0 09/02/2021   CHOLHDL 3 09/02/2021       Latest Ref Rng & Units 09/02/2021    7:51 AM 08/22/2021   12:15 AM 08/24/2020    7:42 AM  Hepatic Function  Total Protein 6.0 - 8.3 g/dL 6.3  7.9  6.3   Albumin 3.5 - 5.2 g/dL 3.9  4.2  3.8   AST 0 - 37 U/L 19  31  15    ALT 0 - 35 U/L 18  24  17    Alk Phosphatase 39 - 117 U/L 61  64  58   Total Bilirubin 0.2 - 1.2 mg/dL 0.7  0.6  0.5     Lab Results  Component Value Date/Time   TSH 1.79 03/04/2022 08:33 AM   TSH 2.79 09/02/2021 07:51 AM   FREET4 0.85 08/22/2021 12:15 AM   FREET4 1.04 09/19/2018 03:55 PM       Latest Ref Rng & Units 10/28/2021   10:05 AM 09/02/2021  7:51 AM 08/24/2021    4:09 AM  CBC  WBC 3.4 - 10.8 x10E3/uL 7.1  4.2  8.5   Hemoglobin 11.1 - 15.9 g/dL 16.1  09.6  04.5   Hematocrit 34.0 - 46.6 % 40.3  38.5  40.8   Platelets 150 - 450 x10E3/uL 228  211.0  331     Lab Results  Component Value Date/Time   VD25OH 49 04/15/2013 08:16 AM   VD25OH 30 04/13/2012 07:40 AM    Clinical ASCVD: No  The 10-year ASCVD risk score (Arnett DK, et al., 2019) is: 11.4%   Values used to calculate the score:     Age: 19 years     Sex: Female     Is Non-Hispanic African American: No     Diabetic: No     Tobacco smoker: No     Systolic Blood Pressure: 141 mmHg     Is BP treated: Yes     HDL Cholesterol: 65 mg/dL     Total Cholesterol: 184 mg/dL    [id number] Stroke Risk Points  Current as of 2  days ago     3 >= 2 Points: High Risk  1 - 1.99 Points: Medium Risk  0 Points: Low Risk    Last Change: N/A      Points Metrics  0 Has Congestive Heart Failure:  No    Current as of 2 days ago  0 Has Vascular Disease:  No    Current as of 2 days ago  1 Has Hypertension:  Yes    Current as of 2 days ago  1 Age:  14    Current as of 2 days ago  0 Has Diabetes:  No    Current as of 2 days ago  0 Had Stroke:  No  Had TIA:  No  Had Thromboembolism:  No    Current as of 2 days ago  1 Female:  Yes    Current as of 2 days ago       02/14/2022   11:08 AM 11/15/2021    2:10 PM 09/06/2021    8:27 AM  Depression screen PHQ 2/9  Decreased Interest 0 0 0  Down, Depressed, Hopeless 0 0 0  PHQ - 2 Score 0 0 0  Altered sleeping 1    Tired, decreased energy 1    Change in appetite 1    Feeling bad or failure about yourself  0    Trouble concentrating 0    Moving slowly or fidgety/restless 0    Suicidal thoughts 0    PHQ-9 Score 3    Difficult doing work/chores Not difficult at all         02/14/2022   11:09 AM  GAD 7 : Generalized Anxiety Score  Nervous, Anxious, on Edge 1  Control/stop worrying 0  Worry too much - different things 1  Trouble relaxing 1  Restless 1  Easily annoyed or irritable 1  Afraid - awful might happen 1  Total GAD 7 Score 6  Anxiety Difficulty Not difficult at all     Social History   Tobacco Use  Smoking Status Former   Types: Cigarettes   Quit date: 01/17/2001   Years since quitting: 21.4   Passive exposure: Past  Smokeless Tobacco Never  Tobacco Comments   Former smoker 01/13/2021   BP Readings from Last 3 Encounters:  04/13/22 (!) 141/76  03/23/22 126/76  03/21/22 (!) 147/78   Pulse Readings from Last 3 Encounters:  04/13/22 68  03/23/22 60  03/21/22 (!) 54   Wt Readings from Last 3 Encounters:  03/23/22 140 lb (63.5 kg)  02/14/22 140 lb 4 oz (63.6 kg)  02/08/22 136 lb 3.2 oz (61.8 kg)   BMI Readings from Last 3 Encounters:   03/23/22 21.93 kg/m  02/14/22 21.97 kg/m  02/08/22 21.33 kg/m    Allergies  Allergen Reactions   Fentanyl Other (See Comments)    Seizure    Buspar [Buspirone]     Felt funny   Calcium     REACTION: cannot tolerate any calcium supplement - GI sympotms   Fluticasone Propionate     REACTION: headache/irritation   Hctz [Hydrochlorothiazide] Other (See Comments)    Eye redness and discomfort/ slt blurred vision    Metoprolol     Low pulse and dizziness    Paroxetine     REACTION: abdominal pain and constipation   Zoloft [Sertraline]     Diarrhea    Hydroxychloroquine Itching and Rash    Medications Reviewed Today     Reviewed by Kathyrn Sheriff, [facility] (Pharmacist) on 06/23/22 at 1018  Med List Status: <None>   Medication Order Taking? Sig Documenting Provider Last Dose Status Informant  acetaminophen (TYLENOL) 325 MG tablet [id number] Yes Take 650 mg by mouth every 6 (six) hours as needed for moderate pain, mild pain or headache. [provider] Taking Active Self  Cholecalciferol (VITAMIN D3) 125 MCG (5000 UT) CAPS [id number] Yes Take 1 capsule by mouth daily. [provider] Taking Active   diltiazem (CARDIZEM) 30 MG tablet [id number] Yes Take 1 tablet every 4 hours AS NEEDED for AFIB HR >100 Fenton, Clint R, PA Taking Active Self  levothyroxine (SYNTHROID) 50 MCG tablet [id number] Yes Take 50 mcg by mouth daily before breakfast. [provider] Taking Active Self  LORazepam (ATIVAN) 1 MG tablet [id number] Yes TAKE 1/2 TABLET BY MOUTH AT BEDTIME AS NEEDED Tower, Audrie Gallus, MD Taking Active   Oxymetazoline HCl (RHOFADE) 1 % CREA [id number] Yes Apply 1 Application topically every morning. Qam to face Deirdre Evener, MD Taking Active   predniSONE (DELTASONE) 1 MG tablet [id number] Yes Take 1 mg by mouth. 2-3 times daily [provider] Taking Active Self  rivaroxaban (XARELTO) 20 MG TABS tablet [id number] Yes Take 1 tablet (20 mg total) by mouth  daily with supper. Fenton, Clint R, PA Taking Active             SDOH:  (Social Determinants of Health) assessments and interventions performed: No SDOH Interventions    Flowsheet Row Clinical Support from 11/15/2021 in [facility] [place]  SDOH Interventions   Transportation Interventions Intervention Not Indicated  Utilities Interventions Intervention Not Indicated  Social Connections Interventions Intervention Not Indicated       Medication Assistance:  Xarelto - PAP through Afib clinic last approved through 01/16/22  Medication Access: Within the past 30 days, how often has patient missed a dose of medication? 0 Is a pillbox or other method used to improve adherence? Yes  Factors that may affect medication adherence? adverse effects of medications Are meds synced by current pharmacy? No  Are meds delivered by current pharmacy? No  Does patient experience delays in picking up medications due to transportation concerns? No  Current Rx insurance plan: [employer] Name and location of Current pharmacy:  [facility] [phone] [place], [place] - [address] [place] [address] Phone: [phone] Fax: [phone]  [facility] [phone] -  [place] - [address] [place] [address] Phone: [phone] Fax: [phone]  Compliance/Adherence/Medication fill history: Care Gaps: None  Star-Rating Drugs: None   Assessment/Plan   Osteoporosis (Goal prevent fractures) -Uncontrolled - recent repeat DEXA scan established osteoporosis dx; pt has declined drug therapy -Pt walks 1-2 miles per day, lifts arm weights  -Last DEXA Scan: 12/17/21   T-Score femoral neck: -2.4  T-score total hip: - 2.8  T-Score lumbar spine: -2.3  T-Score forearm radius: -1.5  10-year probability of major osteoporotic fracture: 20%  10-year probability of hip fracture: 4.9% -Patient is a candidate for pharmacologic  treatment due to T-Score < -2.5 in total hip  -Current treatment  Vitamin D 5000 IU daily - Appropriate, Effective, Safe, Accessible -Medications previously tried: none  -Recommend weight-bearing and muscle strengthening exercises for building and maintaining bone density. -Reviewed osteoporosis risk for fracture and downstream consequences if she were to have a fracture, including loss of independence; pt has decided she does not wish to pursue pharmacological treatment for osteoporosis, encouraged pt to continue to revisit this topic  Hypertension (BP goal <130/80) -Controlled - BP slightly elevated in office but at goal at home per pt report -Current home BP readings: 115-128/60s; P 70s -Current treatment: None  -Medications previously tried: amlodipine, HCTZ (redness), metoprolol (low HR) -Educated on BP goals and benefits of medications for prevention of heart attack, stroke and kidney damage; Importance of home blood pressure monitoring; Proper BP monitoring technique; -Counseled to monitor BP at home daily, document, and provide log at future appointments  Atrial Fibrillation (Goal: prevent stroke and major bleeding) -Controlled - pt reports improvement in HR after stopping metoprolol -s/p ablation 10/2021; CHADSVASC: 3 -Current home readings: 115-128/60s; P 70s -Current treatment: Diltiazem 30 mg PRN HR > 100 - Appropriate, Effective, Safe, Accessible Xarelto 20 mg daily - Appropriate, Effective, Safe, Accessible -Medications previously tried: amiodarone, flecainide, metoprolol -Counseled on increased risk of stroke due to Afib and benefits of anticoagulation for stroke prevention; importance of adherence to anticoagulant exactly as prescribed; bleeding risk associated with Xarelto and importance of self-monitoring for signs/symptoms of bleeding; seeking medical attention after a head injury or if there is blood in the urine/stool; -Recommend to continue current  medication  Depression/Anxiety (Goal: manage symptoms) -Controlled - pt reports she takes Lorazepam most nights to help with sleep; she usually gets 6-7 hrs of sleep and denies daytime sleepiness; she has been on lorazepam for over 15 years -PHQ9: 3 (01/2022) -GAD7: 6 (01/2022) -Connected with PCP for mental health support -Current treatment: Lorazepam 1 mg -1/2 tab HS PRN - Appropriate, Effective, Safe, Accessible -Medications previously tried/failed: paroxetine (constipation), sertraline (diarrhea), buspar (felt funny) -Recommended to continue current medication  Polymyalgia rheumatica (Goal: manage pain) -Controlled - follows with rheumatology; she has multiple intolerances to previous meds and is slowly tapering prednisone -Current treatment  Prednisone 1 mg  2-3 x daily - Appropriate, Effective, Safe, Accessible -Medications previously tried: plaquenil (rash), MTX (afib), leflunomide  -Reviewed long term risks of prednisone including low bone density and fractures - see osteoporosis section -Recommended to continue current medication  -Hypothyroidism -Current treatment  Levothyroxine 50 mcg daily - Appropriate, Effective, Safe, Accessible  Health Maintenance -Vaccine gaps: Shingrix, Covid booster -Hx Migraine   Al Corpus, PharmD, BCACP Clinical Pharmacist [facility] [phone]

## 2022-06-27 DIAGNOSIS — M810 Age-related osteoporosis without current pathological fracture: Secondary | ICD-10-CM | POA: Diagnosis not present

## 2022-06-27 DIAGNOSIS — M353 Polymyalgia rheumatica: Secondary | ICD-10-CM | POA: Diagnosis not present

## 2022-07-27 ENCOUNTER — Other Ambulatory Visit: Payer: Self-pay | Admitting: Rheumatology

## 2022-07-27 DIAGNOSIS — M79662 Pain in left lower leg: Secondary | ICD-10-CM

## 2022-07-28 ENCOUNTER — Ambulatory Visit
Admission: RE | Admit: 2022-07-28 | Discharge: 2022-07-28 | Disposition: A | Discharge status: Home / Self Care | Payer: Medicare HMO | Source: Ambulatory Visit | Attending: Rheumatology | Admitting: Rheumatology

## 2022-07-28 DIAGNOSIS — M79662 Pain in left lower leg: Secondary | ICD-10-CM

## 2022-07-28 DIAGNOSIS — M79605 Pain in left leg: Secondary | ICD-10-CM | POA: Diagnosis not present

## 2022-08-09 DIAGNOSIS — M25562 Pain in left knee: Secondary | ICD-10-CM | POA: Diagnosis not present

## 2022-08-09 DIAGNOSIS — M1712 Unilateral primary osteoarthritis, left knee: Secondary | ICD-10-CM | POA: Diagnosis not present

## 2022-08-17 ENCOUNTER — Encounter (INDEPENDENT_AMBULATORY_CARE_PROVIDER_SITE_OTHER): Payer: Self-pay

## 2022-09-01 ENCOUNTER — Telehealth: Payer: Self-pay | Admitting: Family Medicine

## 2022-09-01 DIAGNOSIS — I10 Essential (primary) hypertension: Secondary | ICD-10-CM

## 2022-09-01 DIAGNOSIS — E039 Hypothyroidism, unspecified: Secondary | ICD-10-CM

## 2022-09-01 NOTE — Telephone Encounter (Signed)
-----   Message from Alvina Chou sent at 08/19/2022  3:51 PM EDT ----- Regarding: Lab orders for Friday, 8.16.24 Patient is scheduled for CPX labs, please order future labs, Thanks , Camelia Eng

## 2022-09-02 ENCOUNTER — Other Ambulatory Visit (INDEPENDENT_AMBULATORY_CARE_PROVIDER_SITE_OTHER): Payer: Medicare HMO

## 2022-09-02 DIAGNOSIS — E039 Hypothyroidism, unspecified: Secondary | ICD-10-CM

## 2022-09-02 DIAGNOSIS — I10 Essential (primary) hypertension: Secondary | ICD-10-CM

## 2022-09-02 LAB — CBC WITH DIFFERENTIAL/PLATELET
Basophils Absolute: 0 10*3/uL (ref 0.0–0.1)
Basophils Relative: 0.6 % (ref 0.0–3.0)
Eosinophils Absolute: 0 10*3/uL (ref 0.0–0.7)
Eosinophils Relative: 0.8 % (ref 0.0–5.0)
HCT: 38.3 % (ref 36.0–46.0)
Hemoglobin: 12.5 g/dL (ref 12.0–15.0)
Lymphocytes Relative: 28.3 % (ref 12.0–46.0)
Lymphs Abs: 1.1 10*3/uL (ref 0.7–4.0)
MCHC: 32.6 g/dL (ref 30.0–36.0)
MCV: 88.3 fl (ref 78.0–100.0)
Monocytes Absolute: 0.3 10*3/uL (ref 0.1–1.0)
Monocytes Relative: 8.9 % (ref 3.0–12.0)
Neutro Abs: 2.4 10*3/uL (ref 1.4–7.7)
Neutrophils Relative %: 61.4 % (ref 43.0–77.0)
Platelets: 206 10*3/uL (ref 150.0–400.0)
RBC: 4.33 Mil/uL (ref 3.87–5.11)
RDW: 14.5 % (ref 11.5–15.5)
WBC: 3.9 10*3/uL — ABNORMAL LOW (ref 4.0–10.5)

## 2022-09-02 LAB — LIPID PANEL
Cholesterol: 188 mg/dL (ref 0–200)
HDL: 58.5 mg/dL (ref >39.00)
LDL Cholesterol: 111 mg/dL — ABNORMAL HIGH (ref 0–99)
NonHDL: 129.04
Total CHOL/HDL Ratio: 3
Triglycerides: 91 mg/dL (ref 0.0–149.0)
VLDL: 18.2 mg/dL (ref 0.0–40.0)

## 2022-09-02 LAB — COMPREHENSIVE METABOLIC PANEL
ALT: 16 U/L (ref 0–35)
AST: 24 U/L (ref 0–37)
Albumin: 4.1 g/dL (ref 3.5–5.2)
Alkaline Phosphatase: 52 U/L (ref 39–117)
BUN: 10 mg/dL (ref 6–23)
CO2: 29 mEq/L (ref 19–32)
Calcium: 9.3 mg/dL (ref 8.4–10.5)
Chloride: 99 mEq/L (ref 96–112)
Creatinine, Ser: 0.72 mg/dL (ref 0.40–1.20)
GFR: 85.88 mL/min (ref >60.00)
Glucose, Bld: 86 mg/dL (ref 70–99)
Potassium: 3.8 mEq/L (ref 3.5–5.1)
Sodium: 134 mEq/L — ABNORMAL LOW (ref 135–145)
Total Bilirubin: 0.7 mg/dL (ref 0.2–1.2)
Total Protein: 6.4 g/dL (ref 6.0–8.3)

## 2022-09-02 LAB — TSH: TSH: 1.43 u[IU]/mL (ref 0.35–5.50)

## 2022-09-05 DIAGNOSIS — H31092 Other chorioretinal scars, left eye: Secondary | ICD-10-CM | POA: Diagnosis not present

## 2022-09-05 DIAGNOSIS — Z961 Presence of intraocular lens: Secondary | ICD-10-CM | POA: Diagnosis not present

## 2022-09-05 DIAGNOSIS — H35373 Puckering of macula, bilateral: Secondary | ICD-10-CM | POA: Diagnosis not present

## 2022-09-05 DIAGNOSIS — H35342 Macular cyst, hole, or pseudohole, left eye: Secondary | ICD-10-CM | POA: Diagnosis not present

## 2022-09-05 DIAGNOSIS — H59811 Chorioretinal scars after surgery for detachment, right eye: Secondary | ICD-10-CM | POA: Diagnosis not present

## 2022-09-05 DIAGNOSIS — H338 Other retinal detachments: Secondary | ICD-10-CM | POA: Diagnosis not present

## 2022-09-08 ENCOUNTER — Encounter: Payer: Self-pay | Admitting: Family Medicine

## 2022-09-08 ENCOUNTER — Ambulatory Visit (INDEPENDENT_AMBULATORY_CARE_PROVIDER_SITE_OTHER): Payer: Medicare HMO | Admitting: Family Medicine

## 2022-09-08 VITALS — BP 136/64 | HR 64 | Temp 97.8°F | Ht 65.5 in | Wt 136.4 lb

## 2022-09-08 DIAGNOSIS — E039 Hypothyroidism, unspecified: Secondary | ICD-10-CM

## 2022-09-08 DIAGNOSIS — I10 Essential (primary) hypertension: Secondary | ICD-10-CM | POA: Diagnosis not present

## 2022-09-08 DIAGNOSIS — I4891 Unspecified atrial fibrillation: Secondary | ICD-10-CM | POA: Diagnosis not present

## 2022-09-08 DIAGNOSIS — Z1211 Encounter for screening for malignant neoplasm of colon: Secondary | ICD-10-CM

## 2022-09-08 DIAGNOSIS — M353 Polymyalgia rheumatica: Secondary | ICD-10-CM | POA: Diagnosis not present

## 2022-09-08 DIAGNOSIS — M810 Age-related osteoporosis without current pathological fracture: Secondary | ICD-10-CM | POA: Diagnosis not present

## 2022-09-08 DIAGNOSIS — I480 Paroxysmal atrial fibrillation: Secondary | ICD-10-CM

## 2022-09-08 DIAGNOSIS — D6869 Other thrombophilia: Secondary | ICD-10-CM

## 2022-09-08 DIAGNOSIS — Z0000 Encounter for general adult medical examination without abnormal findings: Secondary | ICD-10-CM | POA: Diagnosis not present

## 2022-09-08 DIAGNOSIS — Z1231 Encounter for screening mammogram for malignant neoplasm of breast: Secondary | ICD-10-CM

## 2022-09-08 MED ORDER — LEVOTHYROXINE SODIUM 50 MCG PO TABS: 50.0000 ug | ORAL_TABLET | Freq: Every day | ORAL | 3 refills | Status: DC

## 2022-09-08 NOTE — Assessment & Plan Note (Signed)
Had dexa at rheumatologist  On 2 mg of prednisone daily for PMR  This may be long term   Declines any treatment for osteoporosis now  No falls or fracture  Discussed importance of vit D  Also strength building exercise  Fall prevention discussed

## 2022-09-08 NOTE — Assessment & Plan Note (Signed)
No reocc since ablation

## 2022-09-08 NOTE — Progress Notes (Signed)
Subjective:    Patient ID: Denise Grant, female    DOB: [birth year], 67 y.o.   MRN: [id number]  HPI  Here for health maintenance exam and to review chronic medical problems   Wt Readings from Last 3 Encounters:  09/08/22 136 lb 6 oz (61.9 kg)  03/23/22 140 lb (63.5 kg)  02/14/22 140 lb 4 oz (63.6 kg)   22.35 kg/m  Vitals:   09/08/22 0929  BP: 136/64  Pulse: 64  Temp: 97.8 F (36.6 C)  SpO2: 99%    Immunization History  Administered Date(s) Administered   Influenza Inj Mdck Quad Pf 10/14/2017   Influenza Split 11/24/2010   Influenza, High Dose Seasonal PF 10/10/2019   Influenza, Seasonal, Injecte, Preservative Fre 10/21/2015   Influenza,inj,Quad PF,6+ Mos 10/27/2016, 09/27/2018   Influenza-Unspecified 10/18/2013, 11/01/2014, 11/05/2020   Moderna Sars-Covid-2 Vaccination 04/06/2019, 05/04/2019   Td 04/17/2002   Tdap 05/11/2012    Health Maintenance Due  Topic Date Due   DTaP/Tdap/Td (3 - Td or Tdap) 05/12/2022   Shintrix- declines   Prevnar -= declines   Tetanus - declines    Mammogram 09/2021  due soon-[place]  Self breast exam  Gyn health Sees gyn    Colon cancer screening - cologuard neg 09/2019   due in sept   Bone health - dexa 2014  Dexa openia Just had one at rheumatology  some osteoporosis   Falls-none  Fractures-none  Supplements -vit D  Exercise - walking and golf and weights   Mood    09/08/2022    9:33 AM 02/14/2022   11:08 AM 11/15/2021    2:10 PM 09/06/2021    8:27 AM 09/02/2021    9:36 AM  Depression screen PHQ 2/9  Decreased Interest 0 0 0 0 0  Down, Depressed, Hopeless 0 0 0 0 0  PHQ - 2 Score 0 0 0 0 0  Altered sleeping 0 1     Tired, decreased energy 1 1     Change in appetite 0 1     Feeling bad or failure about yourself  0 0     Trouble concentrating 0 0     Moving slowly or fidgety/restless 0 0     Suicidal thoughts 0 0     PHQ-9 Score 1 3     Difficult doing work/chores Somewhat difficult Not difficult at all        HTN bp is stable today  No cp or palpitations or headaches or edema  No side effects to medicines  BP Readings from Last 3 Encounters:  09/08/22 136/64  04/13/22 (!) 141/76  03/23/22 126/76    Pulse Readings from Last 3 Encounters:  09/08/22 64  04/13/22 68  03/23/22 60    Toprol xl 6.25 mg daily (1/4 of a tabl)  Diltiazem 30 mg prn a fib -has not needed   Had ablation done  No more a fib so far   Lab Results  Component Value Date   NA 134 (L) 09/02/2022   K 3.8 09/02/2022   CO2 29 09/02/2022   GLUCOSE 86 09/02/2022   BUN 10 09/02/2022   CREATININE 0.72 09/02/2022   CALCIUM 9.3 09/02/2022   GFR 85.88 09/02/2022   EGFR 73 10/28/2021   GFRNONAA >60 08/23/2021   No diarrhea or vomiting lately  Dirnks a lotof water    Hypothyroidism  Pt has no clinical changes No change in energy level/ hair or skin/ edema and no tremor Lab Results  Component  Value Date   TSH 1.43 09/02/2022    Levothyroxine 50 mcg daily  PMR Rheumatology  Some days are better than others  2 mg prednisone daily - takes 3 mg if it is a bad day  This may be long term   Cholesterol Lab Results  Component Value Date   CHOL 188 09/02/2022   CHOL 184 09/02/2021   CHOL 155 08/24/2020   Lab Results  Component Value Date   HDL 58.50 09/02/2022   HDL 65.00 09/02/2021   HDL 54.70 08/24/2020   Lab Results  Component Value Date   LDLCALC 111 (H) 09/02/2022   LDLCALC 104 (H) 09/02/2021   LDLCALC 83 08/24/2020   Lab Results  Component Value Date   TRIG 91.0 09/02/2022   TRIG 76.0 09/02/2021   TRIG 85.0 08/24/2020   Lab Results  Component Value Date   CHOLHDL 3 09/02/2022   CHOLHDL 3 09/02/2021   CHOLHDL 3 08/24/2020   No results found for: "LDLDIRECT"  Diet has changes a bit  No fried foods  Occational cheese       Patient Active Problem List   Diagnosis Date Noted   History of UTI 02/14/2022   Encounter for screening mammogram for breast cancer 09/06/2021   PMR  (polymyalgia rheumatica) (HCC) 09/06/2021   Atrial fibrillation with RVR (HCC) 08/22/2021   Abnormal x-ray of knee 08/17/2021   Effusion of right knee 08/17/2021   Acute pain of right knee 08/16/2021   Posterior knee pain, right 08/16/2021   Rheumatoid factor positive 08/28/2020   Myalgia 08/25/2020   Secondary hypercoagulable state (HCC) 07/05/2019   Lumbar disc disease    Bilateral bunions    Paroxysmal atrial fibrillation (HCC) 09/27/2018   Essential hypertension 01/01/2015   Hypothyroidism 06/25/2014   Migraine with vertigo 05/21/2014   History of Chiari malformation 05/21/2014   Atrophic vaginitis 05/01/2013   Colon cancer screening 04/20/2012   Routine general medical examination at a health care facility 11/18/2010   INSOMNIA 01/27/2010   HEADACHE 03/31/2008   Osteoporosis 09/18/2007   Generalized anxiety disorder 05/05/2006   MITRAL VALVE PROLAPSE 05/05/2006   ALLERGIC RHINITIS 05/05/2006   FIBROCYSTIC BREAST DISEASE 05/05/2006   SEBORRHEIC DERMATITIS 05/05/2006   KERATOSIS, ACTINIC 05/05/2006   NECK PAIN, CHRONIC 05/05/2006   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.0)    Osteopenia    Seborrheic dermatitis, unspecified    Vaginal cyst    stable   Past Surgical History:  Procedure Laterality Date   AIR/FLUID EXCHANGE Right 07/25/2018   Procedure: Air/Fluid Exchange;  Surgeon: Carmela Rima, MD;  Location: [facility] OR;  Service: Ophthalmology;  Laterality: Right;   ATRIAL FIBRILLATION ABLATION N/A 11/09/2021   Procedure: ATRIAL FIBRILLATION ABLATION;  Surgeon: Regan Lemming, MD;  Location: [facility] IN[facility]IVE [facility];  Service: Cardiovascular;  Laterality: N/A;   bone spur removal  10/06   shoulder    CATARACT EXTRACTION, BILATERAL Bilateral 2016   CERVICAL DISC SURGERY  2005   Chiari Malformation surgery  6/03   EXCISION MORTON'S NEUROMA  2/04   GAS INSERTION Right 07/25/2018   Procedure: Insertion Of Gas SF6;  Surgeon: Carmela Rima, MD;  Location: [facility] OR;  Service: Ophthalmology;  Laterality: Right;   LUMBAR DISC SURGERY     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   Procedure: REPAIR OF COMPLEX TRACTION RETINAL DETACHMENT - 25GA VITRECTOMY, ENDOLASER, DRAINAGE OF SUBRETINAL FLUID;  Surgeon: Carmela Rima, MD;  Location: [facility] OR;  Service: Ophthalmology;  Laterality: Right;   TUBAL LIGATION     Social History   Tobacco Use   Smoking status: Former    Current packs/day: 0.00    Types: Cigarettes    Quit date: 01/17/2001    Years since quitting: 21.6    Passive exposure: Past   Smokeless tobacco: Never   Tobacco comments:    Former smoker 01/13/2021  Substance Use Topics   Alcohol use: No    Alcohol/week: 0.0 standard drinks of alcohol   Drug use: No   Family History  Problem Relation Age of Onset   Melanoma Sister    Lung cancer Father    Coronary artery disease Mother    Heart failure Mother    Hypertension Mother    Anxiety disorder Son    Lung cancer Brother        + smoker   Breast cancer Neg Hx    Allergies  Allergen Reactions   Fentanyl Other (See Comments)    Seizure    Buspar [Buspirone]     Felt funny   Calcium     REACTION: cannot tolerate any calcium supplement - GI sympotms   Fluticasone Propionate     REACTION: headache/irritation   Hctz [Hydrochlorothiazide] Other (See Comments)    Eye redness and discomfort/ slt blurred vision    Metoprolol     Low pulse and dizziness    Paroxetine     REACTION: abdominal pain and constipation   Zoloft [Sertraline]     Diarrhea    Hydroxychloroquine Itching and Rash   Current Outpatient Medications on File Prior to Visit  Medication Sig Dispense Refill   acetaminophen (TYLENOL) 325 MG  tablet Take 650 mg by mouth every 6 (six) hours as needed for moderate pain, mild pain or headache.     Cholecalciferol (VITAMIN D3) 125 MCG (5000 UT) CAPS Take 1 capsule by mouth daily.     diltiazem (CARDIZEM) 30 MG tablet Take 1 tablet every 4 hours AS NEEDED for AFIB HR >100 30 tablet 1   LORazepam (ATIVAN) 1 MG tablet TAKE 1/2 TABLET BY MOUTH AT BEDTIME AS NEEDED 15 tablet 3   metoprolol succinate (TOPROL-XL) 25 MG 24 hr tablet Take 6.25 mg by mouth daily.     Multiple Vitamins-Minerals (HAIR SKIN & NAILS PO) Take 1 capsule by mouth daily.     predniSONE (DELTASONE) 1 MG tablet Take 1 mg by mouth. 2-3 times daily     rivaroxaban (XARELTO) 20 MG TABS tablet Take 1 tablet (20 mg total) by mouth daily with supper. 90 tablet 2   No current facility-administered medications on file prior to visit.    Review of Systems  Constitutional:  Negative for activity change, appetite change, fatigue, fever and unexpected weight change.  HENT:  Negative for congestion, ear pain, rhinorrhea, sinus pressure and sore throat.   Eyes:  Negative for pain, redness and visual disturbance.  Respiratory:  Negative for cough, shortness of breath and wheezing.   Cardiovascular:  Negative for chest pain and palpitations.  Gastrointestinal:  Negative for abdominal pain, blood in stool, constipation and diarrhea.  Endocrine: Negative for polydipsia and polyuria.  Genitourinary:  Negative for dysuria, frequency and  urgency.  Musculoskeletal:  Positive for arthralgias and myalgias. Negative for back pain.  Skin:  Negative for pallor and rash.  Allergic/Immunologic: Negative for environmental allergies.  Neurological:  Negative for dizziness, syncope and headaches.  Hematological:  Negative for adenopathy. Does not bruise/bleed easily.  Psychiatric/Behavioral:  Negative for decreased concentration and dysphoric mood. The patient is not nervous/anxious.        Objective:   Physical Exam Constitutional:       General: She is not in acute distress.    Appearance: Normal appearance. She is well-developed and normal weight. She is not ill-appearing or diaphoretic.  HENT:     Head: Normocephalic and atraumatic.     Right Ear: Tympanic membrane, ear canal and external ear normal.     Left Ear: Tympanic membrane, ear canal and external ear normal.     Nose: Nose normal. No congestion.     Mouth/Throat:     Mouth: Mucous membranes are moist.     Pharynx: Oropharynx is clear. No posterior oropharyngeal erythema.  Eyes:     General: No scleral icterus.    Extraocular Movements: Extraocular movements intact.     Conjunctiva/sclera: Conjunctivae normal.     Pupils: Pupils are equal, round, and reactive to light.  Neck:     Thyroid: No thyromegaly.     Vascular: No carotid bruit or JVD.  Cardiovascular:     Rate and Rhythm: Normal rate and regular rhythm.     Pulses: Normal pulses.     Heart sounds: Normal heart sounds.     No gallop.  Pulmonary:     Effort: Pulmonary effort is normal. No respiratory distress.     Breath sounds: Normal breath sounds. No wheezing.     Comments: Good air exch Chest:     Chest wall: No tenderness.  Abdominal:     General: Bowel sounds are normal. There is no distension or abdominal bruit.     Palpations: Abdomen is soft. There is no mass.     Tenderness: There is no abdominal tenderness.     Hernia: No hernia is present.  Genitourinary:    Comments: Breast exam: No mass, nodules, thickening, tenderness, bulging, retraction, inflamation, nipple discharge or skin changes noted.  No axillary or clavicular LA.     Musculoskeletal:        General: No tenderness. Normal range of motion.     Cervical back: Normal range of motion and neck supple. No rigidity. No muscular tenderness.     Right lower leg: No edema.     Left lower leg: No edema.     Comments: No kyphosis   Lymphadenopathy:     Cervical: No cervical adenopathy.  Skin:    General: Skin is warm and dry.      Coloration: Skin is not pale.     Findings: No erythema or rash.     Comments: Solar lentigines diffusely   Neurological:     Mental Status: She is alert. Mental status is at baseline.     Cranial Nerves: No cranial nerve deficit.     Motor: No abnormal muscle tone.     Coordination: Coordination normal.     Gait: Gait normal.     Deep Tendon Reflexes: Reflexes are normal and symmetric. Reflexes normal.  Psychiatric:        Mood and Affect: Mood normal.        Cognition and Memory: Cognition and memory normal.  Assessment & Plan:   Problem List Items Addressed This Visit       Cardiovascular and Mediastinum   Atrial fibrillation with RVR (HCC)    No re occurrence since ablation  Continues cardiology follow up       Relevant Medications   metoprolol succinate (TOPROL-XL) 25 MG 24 hr tablet   Essential hypertension    bp in fair control at this time  BP Readings from Last 1 Encounters:  09/08/22 136/64   No changes needed Most recent labs reviewed  Disc lifstyle change with low sodium diet and exercise  Continue toprol xl 6.25 mg daily from cardiology In nsr now         Relevant Medications   metoprolol succinate (TOPROL-XL) 25 MG 24 hr tablet   Paroxysmal atrial fibrillation (HCC)    No reocc since ablation       Relevant Medications   metoprolol succinate (TOPROL-XL) 25 MG 24 hr tablet     Endocrine   Hypothyroidism    Hypothyroidism  Pt has no clinical changes No change in energy level/ hair or skin/ edema and no tremor Lab Results  Component Value Date   TSH 1.43 09/02/2022    Plan to continue levothyroxine 50 mcg daily      Relevant Medications   metoprolol succinate (TOPROL-XL) 25 MG 24 hr tablet   levothyroxine (SYNTHROID) 50 MCG tablet     Musculoskeletal and Integument   Osteoporosis    Had dexa at rheumatologist  On 2 mg of prednisone daily for PMR  This may be long term   Declines any treatment for osteoporosis now   No falls or fracture  Discussed importance of vit D  Also strength building exercise  Fall prevention discussed         Other   Colon cancer screening    Cologuard ordered-due next mo  Declines colonoscopy unless needed       Relevant Orders   Cologuard   Encounter for screening mammogram for breast cancer    Mammogram ordered Pt will call to schedule      Relevant Orders   MM 3D SCREENING MAMMOGRAM BILATERAL BREAST   PMR (polymyalgia rheumatica) (HCC)    On prednisone 2 mg daily  Rheum care  3 mg occational on bad days   Watching glucose and bmd with chronic steroids       Routine general medical examination at a health care facility - Primary    Reviewed health habits including diet and exercise and skin cancer prevention Reviewed appropriate screening tests for age  Also reviewed health mt list, fam hx and immunization status , as well as social and family history   See HPI Labs reviewed and ordered Declines shingrix, prevnar, tetanus andf lu vaccines  Mammogram ordered  Dexa utd /no fallsor fracture  Cologuard ordered -due next month  PHQ 1       Secondary hypercoagulable state (HCC)    Continues xarelto for history of a fib

## 2022-09-08 NOTE — Assessment & Plan Note (Signed)
Reviewed health habits including diet and exercise and skin cancer prevention Reviewed appropriate screening tests for age  Also reviewed health mt list, fam hx and immunization status , as well as social and family history   See HPI Labs reviewed and ordered Declines shingrix, prevnar, tetanus andf lu vaccines  Mammogram ordered  Dexa utd /no fallsor fracture  Cologuard ordered -due next month  PHQ 1

## 2022-09-08 NOTE — Assessment & Plan Note (Signed)
Hypothyroidism  Pt has no clinical changes No change in energy level/ hair or skin/ edema and no tremor Lab Results  Component Value Date   TSH 1.43 09/02/2022    Plan to continue levothyroxine 50 mcg daily

## 2022-09-08 NOTE — Patient Instructions (Addendum)
You are due for tetanus shot booster  Can inquire at the pharmacy   Continue walking  Add some strength training to your routine, this is important for bone and brain health and can reduce your risk of falls and help your body use insulin properly and regulate weight  Light weights, exercise bands , and internet videos are a good way to start  Yoga (chair or regular), machines , floor exercises or a gym with machines are also good options   I ordered cologuard test If you don't hear from the company in 1-2 weeks let us know   You have an order for:  []   2D Mammogram  [x]   3D Mammogram  []   Bone Density     Please call for appointment:   [x]   [facility]  [address] [place] [address]  [phone]  []   [facility])   [address]  [place], [place] [address]  [phone]  []   [facility]       [address] [place], [place]        [phone]         []   [facility]  [address] [place], [place]  [phone]  []  [facility]   [address]   [place], [place] [address]  [phone]  []  [facility]  [address] [place], [place] [address] [phone]    Make sure to wear two piece clothing  No lotions powders or deodorants the day of the appointment Make sure to bring picture ID and insurance card.  Bring list of medications you are currently taking including any supplements.   Schedule your screening mammogram through MyChart!   [facility] imaging sites can now be scheduled through MyChart.  Log into your MyChart account.  Go to 'Visit' (or 'Appointments' if  on mobile App) --> Schedule an  Appointment  Under 'Select a Reason for Visit' choose the Mammogram  Screening  option.  Complete the pre-visit questions  and select the time and place that  best fits your schedule

## 2022-09-08 NOTE — Assessment & Plan Note (Signed)
Cologuard ordered-due next mo  Declines colonoscopy unless needed

## 2022-09-08 NOTE — Assessment & Plan Note (Signed)
On prednisone 2 mg daily  Rheum care  3 mg occational on bad days   Watching glucose and bmd with chronic steroids

## 2022-09-08 NOTE — Assessment & Plan Note (Signed)
No re occurrence since ablation  Continues cardiology follow up

## 2022-09-08 NOTE — Assessment & Plan Note (Signed)
Mammogram ordered °Pt will call to schedule  °

## 2022-09-08 NOTE — Assessment & Plan Note (Signed)
bp in fair control at this time  BP Readings from Last 1 Encounters:  09/08/22 136/64   No changes needed Most recent labs reviewed  Disc lifstyle change with low sodium diet and exercise  Continue toprol xl 6.25 mg daily from cardiology In nsr now

## 2022-09-08 NOTE — Assessment & Plan Note (Signed)
Continues xarelto for history of a fib

## 2022-09-12 DIAGNOSIS — Z1211 Encounter for screening for malignant neoplasm of colon: Secondary | ICD-10-CM | POA: Diagnosis not present

## 2022-09-14 ENCOUNTER — Other Ambulatory Visit: Payer: Self-pay | Admitting: *Deleted

## 2022-09-14 ENCOUNTER — Inpatient Hospital Stay
Admission: RE | Admit: 2022-09-14 | Discharge: 2022-09-14 | Disposition: A | Discharge status: Home / Self Care | Payer: Self-pay | Source: Ambulatory Visit | Attending: Family Medicine | Admitting: Family Medicine

## 2022-09-14 DIAGNOSIS — Z1231 Encounter for screening mammogram for malignant neoplasm of breast: Secondary | ICD-10-CM

## 2022-09-16 LAB — COLOGUARD: COLOGUARD: NEGATIVE

## 2022-09-21 ENCOUNTER — Encounter: Payer: Self-pay | Admitting: *Deleted

## 2022-09-23 ENCOUNTER — Ambulatory Visit: Payer: Medicare HMO | Admitting: Cardiology

## 2022-09-26 ENCOUNTER — Other Ambulatory Visit
Admission: RE | Admit: 2022-09-26 | Discharge: 2022-09-26 | Disposition: A | Discharge status: Home / Self Care | Payer: Medicare HMO | Source: Ambulatory Visit | Attending: Family Medicine | Admitting: Family Medicine

## 2022-09-26 ENCOUNTER — Ambulatory Visit
Admission: RE | Admit: 2022-09-26 | Discharge: 2022-09-26 | Disposition: A | Discharge status: Home / Self Care | Payer: Medicare HMO | Source: Ambulatory Visit | Attending: Family Medicine | Admitting: Family Medicine

## 2022-09-26 ENCOUNTER — Other Ambulatory Visit: Payer: Self-pay | Admitting: Family Medicine

## 2022-09-26 DIAGNOSIS — R7989 Other specified abnormal findings of blood chemistry: Secondary | ICD-10-CM | POA: Insufficient documentation

## 2022-09-26 DIAGNOSIS — M7989 Other specified soft tissue disorders: Secondary | ICD-10-CM | POA: Insufficient documentation

## 2022-09-26 DIAGNOSIS — R609 Edema, unspecified: Secondary | ICD-10-CM | POA: Diagnosis not present

## 2022-09-26 DIAGNOSIS — M7122 Synovial cyst of popliteal space [Baker], left knee: Secondary | ICD-10-CM | POA: Diagnosis not present

## 2022-09-26 LAB — D-DIMER, QUANTITATIVE: D-Dimer, Quant: 1.91 ug{FEU}/mL — ABNORMAL HIGH (ref 0.00–0.50)

## 2022-09-28 ENCOUNTER — Telehealth: Payer: Self-pay | Admitting: Cardiology

## 2022-09-28 DIAGNOSIS — M7122 Synovial cyst of popliteal space [Baker], left knee: Secondary | ICD-10-CM | POA: Diagnosis not present

## 2022-09-28 DIAGNOSIS — M1712 Unilateral primary osteoarthritis, left knee: Secondary | ICD-10-CM | POA: Diagnosis not present

## 2022-09-28 NOTE — Telephone Encounter (Signed)
Pt c/o swelling/edema: STAT if pt has developed SOB within 24 hours  If swelling, where is the swelling located? Leg left and both feet  How much weight have you gained and in what time span? 1 lbs in 3 or 4 days  Have you gained 2 pounds in a day or 5 pounds in a week? No   Do you have a log of your daily weights (if so, list)? No   Are you currently taking a fluid pill? No   Are you currently SOB? No   Have you traveled recently in a car or plane for an extended period of time? No

## 2022-09-28 NOTE — Telephone Encounter (Signed)
Pt reports left leg edema, along w/ pedal edema. States her MD ordered LE [place] for DVT -- test result was negative. Pt can feel her afib and states she is not in afib. MD told her to call her cardiologist.  Aware that I do not think her unilateral leg swelling is r/t her afib/heart. Informed that I am forwarding to MD for advisement, but told her to call her PCP to schedule an OV for evaluation. Patient verbalized understanding and agreeable to plan.

## 2022-09-30 NOTE — Telephone Encounter (Addendum)
Left message to call back  (BNP per [place])

## 2022-10-03 ENCOUNTER — Ambulatory Visit (INDEPENDENT_AMBULATORY_CARE_PROVIDER_SITE_OTHER): Payer: Medicare HMO | Admitting: Family Medicine

## 2022-10-03 VITALS — BP 136/70 | HR 74 | Temp 97.6°F | Ht 65.5 in | Wt 136.5 lb

## 2022-10-03 DIAGNOSIS — I10 Essential (primary) hypertension: Secondary | ICD-10-CM

## 2022-10-03 DIAGNOSIS — R600 Localized edema: Secondary | ICD-10-CM | POA: Diagnosis not present

## 2022-10-03 DIAGNOSIS — I480 Paroxysmal atrial fibrillation: Secondary | ICD-10-CM | POA: Diagnosis not present

## 2022-10-03 MED ORDER — METOPROLOL SUCCINATE ER 25 MG PO TB24: ORAL_TABLET | ORAL | 0 refills | Status: DC

## 2022-10-03 NOTE — Patient Instructions (Addendum)
Let your cardiologist know if you develop any shortness of breath or chest pain  If severe go to the ER  I think some of your ankle swelling comes from varicose veins (venous insufficiency)   A high sodium diet can make this worse Drinking water helps   Support hose (waist or thigh) may help  Try some over the counter first   If you can elevate legs when sitting (when able)    Follow up with orthopedics about the baker's cyst   Talk to the cardiologist about the metoprolol

## 2022-10-03 NOTE — Assessment & Plan Note (Signed)
bp in fair control at this time  BP Readings from Last 1 Encounters:  10/03/22 136/70   No changes needed Most recent labs reviewed  Disc lifstyle change with low sodium diet and exercise  Continue toprol xl 6.25 mg daily from cardiology (? Our name on last bottle)  In nsr now

## 2022-10-03 NOTE — Assessment & Plan Note (Signed)
Takes very low dose of toprol (6.25) Per pt has very few a fib flares since ablation almost a year ago   Has not needed diltiazem at all   In normal rhythm on exam today

## 2022-10-03 NOTE — Assessment & Plan Note (Signed)
Without signs and symptoms of CHF  Reviewed last cardiology notes and echo-reassuring  Has baker's cyst left knee (suspect this worsens it on left side)  Reviewed venous US of LLE recent also  Reviewed most recent labs- reassuring from last mon Some varicosities-suspect that venous insuff plays a role Taking very low dose of toprol xl also (a fib)   Encouraged to investigate supp hose to thigh or waist  Use caution with knee brace Avoid excess sodium Elevate feet when sitting if able   Update if no improvement  Follow up with cardiology and ortho Call back and Er precautions noted in detail today

## 2022-10-03 NOTE — Progress Notes (Signed)
Subjective:    Patient ID: Denise Grant, female    DOB: September 19, 1955, 67 y.o.   MRN: [id number]  HPI  Wt Readings from Last 3 Encounters:  10/03/22 136 lb 8 oz (61.9 kg)  09/08/22 136 lb 6 oz (61.9 kg)  03/23/22 140 lb (63.5 kg)   22.37 kg/m  Vitals:   10/03/22 1155  BP: 136/70  Pulse: 74  Temp: 97.6 F (36.4 C)  SpO2: 98%   Pt presents with c/o feet swelling     Swelling started abot 2 weeks ago  Had been sitting a bit more than usual  Not prolonged  Is dependent - better in am  Can leave a dent in ankle by end of day    Diet= possible she ate more sodium lately  Is a good water drinker   Does not wear support stockings   No chest pain  No shortness of breath  No PND or orthopnea    Saw ortho recently  Has left baker's cyst and OA of left knee  Sees Dr Ernest Pine at [facility]   Had [place] of left LE IMPRESSION: 1. No evidence of femoropopliteal DVT or superficial thrombophlebitis within the LEFT lower extremity. 2. 5 cm LEFT popliteal fossa/Baker cyst.   HTN bp is stable today  No cp or palpitations or headaches or edema  No side effects to medicines  BP Readings from Last 3 Encounters:  10/03/22 136/70  09/08/22 136/64  04/13/22 (!) 141/76    Pulse Readings from Last 3 Encounters:  10/03/22 74  09/08/22 64  04/13/22 68   Takes toprol xl 6.25 mg daily  Diltiazem 30 mg prn for a fib rapid rate   Had ablation almost a year ago  Still has it now and then    Echo 08/2021  Mild LVH  Normal EF 65-70%    Hypothyroid Lab Results  Component Value Date   TSH 1.43 09/02/2022  Levothyroxine 50 mcg daily   Lab Results  Component Value Date   NA 134 (L) 09/02/2022   K 3.8 09/02/2022   CO2 29 09/02/2022   GLUCOSE 86 09/02/2022   BUN 10 09/02/2022   CREATININE 0.72 09/02/2022   CALCIUM 9.3 09/02/2022   GFR 85.88 09/02/2022   EGFR 73 10/28/2021   GFRNONAA >60 08/23/2021   Lab Results  Component Value Date   ALT 16 09/02/2022   AST 24 09/02/2022    ALKPHOS 52 09/02/2022   BILITOT 0.7 09/02/2022   Lab Results  Component Value Date   WBC 3.9 (L) 09/02/2022   HGB 12.5 09/02/2022   HCT 38.3 09/02/2022   MCV 88.3 09/02/2022   PLT 206.0 09/02/2022   Sees rheum for PMR     Patient Active Problem List   Diagnosis Date Noted   Pedal edema 10/03/2022   History of UTI 02/14/2022   Encounter for screening mammogram for breast cancer 09/06/2021   PMR (polymyalgia rheumatica) (HCC) 09/06/2021   Atrial fibrillation with RVR (HCC) 08/22/2021   Abnormal x-ray of knee 08/17/2021   Effusion of right knee 08/17/2021   Acute pain of right knee 08/16/2021   Posterior knee pain, right 08/16/2021   Rheumatoid factor positive 08/28/2020   Myalgia 08/25/2020   Secondary hypercoagulable state (HCC) 07/05/2019   Lumbar disc disease    Bilateral bunions    Paroxysmal atrial fibrillation (HCC) 09/27/2018   Essential hypertension 01/01/2015   Hypothyroidism 06/25/2014   Migraine with vertigo 05/21/2014   History of Chiari malformation 05/21/2014  Atrophic vaginitis 05/01/2013   Colon cancer screening 04/20/2012   Routine general medical examination at a health care facility 11/18/2010   INSOMNIA 01/27/2010   HEADACHE 03/31/2008   Osteoporosis 09/18/2007   Generalized anxiety disorder 05/05/2006   MITRAL VALVE PROLAPSE 05/05/2006   ALLERGIC RHINITIS 05/05/2006   FIBROCYSTIC BREAST DISEASE 05/05/2006   SEBORRHEIC DERMATITIS 05/05/2006   KERATOSIS, ACTINIC 05/05/2006   NECK PAIN, CHRONIC 05/05/2006   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.0)    Osteopenia    Seborrheic dermatitis, unspecified    Vaginal cyst    stable   Past Surgical History:  Procedure Laterality  Date   AIR/FLUID EXCHANGE Right 07/25/2018   Procedure: Air/Fluid Exchange;  Surgeon: Carmela Rima, MD;  Location: [facility] OR;  Service: Ophthalmology;  Laterality: Right;   ATRIAL FIBRILLATION ABLATION N/A 11/09/2021   Procedure: ATRIAL FIBRILLATION ABLATION;  Surgeon: Regan Lemming, MD;  Location: [facility] INVASIVE CV LAB;  Service: Cardiovascular;  Laterality: N/A;   bone spur removal  10/06   shoulder   CATARACT EXTRACTION, BILATERAL Bilateral 2016   CERVICAL DISC SURGERY  2005   Chiari Malformation surgery  6/03   EXCISION MORTON'S NEUROMA  2/04   GAS INSERTION Right 07/25/2018   Procedure: Insertion Of Gas SF6;  Surgeon: Carmela Rima, MD;  Location: [facility] OR;  Service: Ophthalmology;  Laterality: Right;   LUMBAR DISC SURGERY     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   Procedure: REPAIR OF COMPLEX TRACTION RETINAL DETACHMENT - 25GA VITRECTOMY, ENDOLASER, DRAINAGE OF SUBRETINAL FLUID;  Surgeon: Carmela Rima, MD;  Location: [facility] OR;  Service: Ophthalmology;  Laterality: Right;   TUBAL LIGATION     Social History   Tobacco Use   Smoking status: Former    Current packs/day: 0.00    Types: Cigarettes    Quit date: 01/17/2001    Years since quitting: 21.7    Passive exposure: Past   Smokeless tobacco: Never   Tobacco comments:    Former smoker 01/13/2021  Substance Use Topics   Alcohol use: No    Alcohol/week: 0.0 standard drinks of alcohol   Drug use: No   Family History  Problem Relation Age of Onset   Melanoma Sister    Lung cancer Father    Coronary artery disease Mother    Heart failure Mother    Hypertension Mother    Anxiety disorder Son    Lung cancer Brother        + smoker   Breast cancer Neg Hx    Allergies  Allergen Reactions   Fentanyl Other (See Comments)    Seizure    Buspar [Buspirone]     Felt funny   Calcium     REACTION: cannot tolerate any calcium supplement - GI sympotms   Fluticasone Propionate     REACTION:  headache/irritation   Hctz [Hydrochlorothiazide] Other (See Comments)    Eye redness and discomfort/ slt blurred vision    Metoprolol     Low pulse and dizziness    Paroxetine     REACTION: abdominal pain and constipation   Zoloft [Sertraline]     Diarrhea    Hydroxychloroquine Itching and  Rash   Current Outpatient Medications on File Prior to Visit  Medication Sig Dispense Refill   acetaminophen (TYLENOL) 325 MG tablet Take 650 mg by mouth every 6 (six) hours as needed for moderate pain, mild pain or headache.     Cholecalciferol (VITAMIN D3) 125 MCG (5000 UT) CAPS Take 1 capsule by mouth daily.     diltiazem (CARDIZEM) 30 MG tablet Take 1 tablet every 4 hours AS NEEDED for AFIB HR >100 30 tablet 1   levothyroxine (SYNTHROID) 50 MCG tablet Take 1 tablet (50 mcg total) by mouth daily before breakfast. 90 tablet 3   LORazepam (ATIVAN) 1 MG tablet TAKE 1/2 TABLET BY MOUTH AT BEDTIME AS NEEDED 15 tablet 3   Multiple Vitamins-Minerals (HAIR SKIN & NAILS PO) Take 1 capsule by mouth daily.     predniSONE (DELTASONE) 1 MG tablet Take 1 mg by mouth. 2-3 times daily     rivaroxaban (XARELTO) 20 MG TABS tablet Take 1 tablet (20 mg total) by mouth daily with supper. 90 tablet 2   No current facility-administered medications on file prior to visit.    Review of Systems  Constitutional:  Negative for activity change, appetite change, fatigue, fever and unexpected weight change.  HENT:  Negative for congestion, ear pain, rhinorrhea, sinus pressure and sore throat.   Eyes:  Negative for pain, redness and visual disturbance.  Respiratory:  Negative for cough, shortness of breath and wheezing.   Cardiovascular:  Positive for leg swelling. Negative for chest pain and palpitations.  Gastrointestinal:  Negative for abdominal pain, blood in stool, constipation and diarrhea.  Endocrine: Negative for polydipsia and polyuria.  Genitourinary:  Negative for dysuria, frequency and urgency.  Musculoskeletal:   Positive for arthralgias. Negative for back pain and myalgias.  Skin:  Negative for pallor and rash.  Allergic/Immunologic: Negative for environmental allergies.  Neurological:  Negative for dizziness, syncope and headaches.  Hematological:  Negative for adenopathy. Does not bruise/bleed easily.  Psychiatric/Behavioral:  Negative for decreased concentration and dysphoric mood. The patient is not nervous/anxious.        Objective:   Physical Exam Constitutional:      General: She is not in acute distress.    Appearance: Normal appearance. She is well-developed and normal weight. She is not ill-appearing or diaphoretic.  HENT:     Head: Normocephalic and atraumatic.  Eyes:     Conjunctiva/sclera: Conjunctivae normal.     Pupils: Pupils are equal, round, and reactive to light.  Neck:     Thyroid: No thyromegaly.     Vascular: No carotid bruit or JVD.  Cardiovascular:     Rate and Rhythm: Normal rate and regular rhythm.     Heart sounds: Normal heart sounds.     No gallop.     Comments: Some LE varicosities  Pulmonary:     Effort: Pulmonary effort is normal. No respiratory distress.     Breath sounds: Normal breath sounds. No wheezing or rales.  Abdominal:     General: There is no distension or abdominal bruit.     Palpations: Abdomen is soft.  Musculoskeletal:     Cervical back: Normal range of motion and neck supple.     Right lower leg: Edema present.     Left lower leg: Edema present.     Comments: Trace pedal edema bilaterally  Some swelling behind left LE also   No tenderness Limited rom of left knee -is baseline   Some varicosities of different sizes to thigh  No palpable cords   Lymphadenopathy:     Cervical: No cervical adenopathy.  Skin:    General: Skin is warm and dry.     Coloration: Skin is not pale.     Findings: No rash.     Comments: No skin changes or ulcers   Neurological:     Mental Status: She is alert.     Cranial Nerves: No cranial nerve  deficit.     Motor: No weakness.     Coordination: Coordination normal.     Deep Tendon Reflexes: Reflexes are normal and symmetric. Reflexes normal.  Psychiatric:        Mood and Affect: Mood normal.           Assessment & Plan:   Problem List Items Addressed This Visit       Cardiovascular and Mediastinum   Essential hypertension    bp in fair control at this time  BP Readings from Last 1 Encounters:  10/03/22 136/70   No changes needed Most recent labs reviewed  Disc lifstyle change with low sodium diet and exercise  Continue toprol xl 6.25 mg daily from cardiology (? Our name on last bottle)  In nsr now         Relevant Medications   metoprolol succinate (TOPROL-XL) 25 MG 24 hr tablet   Paroxysmal atrial fibrillation (HCC)    Takes very low dose of toprol (6.25) Per pt has very few a fib flares since ablation almost a year ago   Has not needed diltiazem at all   In normal rhythm on exam today      Relevant Medications   metoprolol succinate (TOPROL-XL) 25 MG 24 hr tablet     Other   Pedal edema - Primary    Without signs and symptoms of CHF  Reviewed last cardiology notes and echo-reassuring  Has baker's cyst left knee (suspect this worsens it on left side)  Reviewed venous US of LLE recent also  Reviewed most recent labs- reassuring from last mon Some varicosities-suspect that venous insuff plays a role Taking very low dose of toprol xl also (a fib)   Encouraged to investigate supp hose to thigh or waist  Use caution with knee brace Avoid excess sodium Elevate feet when sitting if able   Update if no improvement  Follow up with cardiology and ortho Call back and Er precautions noted in detail today

## 2022-10-05 ENCOUNTER — Ambulatory Visit (INDEPENDENT_AMBULATORY_CARE_PROVIDER_SITE_OTHER): Payer: Medicare HMO

## 2022-10-05 ENCOUNTER — Ambulatory Visit: Payer: Medicare HMO | Admitting: Family Medicine

## 2022-10-05 DIAGNOSIS — Z0000 Encounter for general adult medical examination without abnormal findings: Secondary | ICD-10-CM | POA: Diagnosis not present

## 2022-10-05 NOTE — Progress Notes (Signed)
Subjective:   Denise Grant is a 67 y.o. female who presents for Medicare Annual (Subsequent) preventive examination.  Visit Complete: Virtual  I connected with  Denise Grant on 10/05/22 by a audio enabled telemedicine application and verified that I am speaking with the correct person using two identifiers.  Patient Location: Home  Provider Location: Office/Clinic  I discussed the limitations of evaluation and management by telemedicine. The patient expressed understanding and agreed to proceed.  Vital Signs: Unable to obtain new vitals due to this being a telehealth visit.  Cardiac Risk Factors include: advanced age (>5men, >52 women);hypertension     Objective:    Today's Vitals   10/05/22 1030  PainSc: 4    There is no height or weight on file to calculate BMI.     10/05/2022   10:36 AM 11/15/2021    2:15 PM 11/09/2021   11:55 AM 08/22/2021    8:00 AM 08/21/2021   11:59 PM 10/19/2020    8:40 AM 10/12/2019    4:52 PM  Advanced Directives  Does Patient Have a Medical Advance Directive? Yes Yes No  No No No  Type of Estate agent of [place];Living will Healthcare Power of Attorney       Does patient want to make changes to medical advance directive?  No - Patient declined       Copy of Healthcare Power of Attorney in Chart? No - copy requested No - copy requested       Would patient like information on creating a medical advance directive?   No - Patient declined No - Patient declined   No - Patient declined    Current Medications (verified) Outpatient Encounter Medications as of 10/05/2022  Medication Sig   acetaminophen (TYLENOL) 325 MG tablet Take 650 mg by mouth every 6 (six) hours as needed for moderate pain, mild pain or headache.   Cholecalciferol (VITAMIN D3) 125 MCG (5000 UT) CAPS Take 1 capsule by mouth daily.   diltiazem (CARDIZEM) 30 MG tablet Take 1 tablet every 4 hours AS NEEDED for AFIB HR >100   levothyroxine (SYNTHROID) 50 MCG  tablet Take 1 tablet (50 mcg total) by mouth daily before breakfast.   LORazepam (ATIVAN) 1 MG tablet TAKE 1/2 TABLET BY MOUTH AT BEDTIME AS NEEDED   metoprolol succinate (TOPROL-XL) 25 MG 24 hr tablet Take 1/4 pill by mouth daily   Multiple Vitamins-Minerals (HAIR SKIN & NAILS PO) Take 1 capsule by mouth daily.   predniSONE (DELTASONE) 1 MG tablet Take 1 mg by mouth. 2-3 times daily   rivaroxaban (XARELTO) 20 MG TABS tablet Take 1 tablet (20 mg total) by mouth daily with supper.   No facility-administered encounter medications on file as of 10/05/2022.    Allergies (verified) Fentanyl, Buspar [buspirone], Calcium, Fluticasone propionate, Hctz [hydrochlorothiazide], Metoprolol, Paroxetine, Zoloft [sertraline], and Hydroxychloroquine   History: 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.0)    Osteopenia    Seborrheic dermatitis, unspecified    Vaginal cyst    stable   Past Surgical History:  Procedure Laterality Date   AIR/FLUID EXCHANGE Right 07/25/2018   Procedure: Air/Fluid Exchange;  Surgeon: Carmela Rima, MD;  Location: [facility] OR;  Service: Ophthalmology;  Laterality: Right;   ATRIAL FIBRILLATION ABLATION N/A 11/09/2021   Procedure: ATRIAL FIBRILLATION ABLATION;  Surgeon: Regan Lemming, MD;  Location: [facility] INVASIVE CV LAB;  Service: Cardiovascular;  Laterality: N/A;   bone spur removal  10/06   shoulder   CATARACT EXTRACTION, BILATERAL Bilateral 2016   CERVICAL DISC SURGERY  2005   Chiari Malformation surgery  6/03   EXCISION MORTON'S NEUROMA  2/04   GAS INSERTION Right 07/25/2018   Procedure: Insertion Of Gas SF6;  Surgeon: Carmela Rima, MD;  Location: [facility] OR;  Service: Ophthalmology;  Laterality: Right;   LUMBAR  DISC SURGERY     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   Procedure: REPAIR OF COMPLEX TRACTION RETINAL DETACHMENT - 25GA VITRECTOMY, ENDOLASER, DRAINAGE OF SUBRETINAL FLUID;  Surgeon: Carmela Rima, MD;  Location: [facility] OR;  Service: Ophthalmology;  Laterality: Right;   TUBAL LIGATION     Family History  Problem Relation Age of Onset   Melanoma Sister    Lung cancer Father    Coronary artery disease Mother    Heart failure Mother    Hypertension Mother    Anxiety disorder Son    Lung cancer Brother        + smoker   Breast cancer Neg Hx    Social History   Socioeconomic History   Marital status: Widowed    Spouse name: Not on file   Number of children: Not on file   Years of education: Not on file   Highest education level: 12th grade  Occupational History   Not on file  Tobacco Use   Smoking status: Former    Current packs/day: 0.00    Types: Cigarettes    Quit date: 01/17/2001    Years since quitting: 21.7    Passive exposure: Past   Smokeless tobacco: Never   Tobacco comments:    Former smoker 01/13/2021  Vaping Use   Vaping status: Never Used  Substance and Sexual Activity   Alcohol use: No    Alcohol/week: 0.0 standard drinks of alcohol   Drug use: No   Sexual activity: Not on file  Other Topics Concern   Not on file  Social History Narrative   Walks daily for exercise      Married--husband diagnosed with stomach cancer (5/11)   Lost her husband 4/12 to cancer    Social Determinants of Health   Financial Resource Strain: Low Risk  (10/05/2022)   Overall Financial Resource Strain (CARDIA)    Difficulty of Paying Living Expenses: Not hard at all  Food Insecurity: No Food Insecurity (10/05/2022)   Hunger Vital Sign    Worried About Running Out of Food in the Last Year: Never true    Ran Out of Food in the Last Year: Never true  Transportation Needs: No Transportation Needs (10/05/2022)   PRAPARE - Scientist, research (physical sciences) (Medical): No    Lack of Transportation (Non-Medical): No  Physical Activity: Inactive (10/05/2022)   Exercise Vital Sign    Days of Exercise per Week: 0 days    Minutes of Exercise per Session: 0 min  Stress: No Stress Concern Present (10/05/2022)   [employer] of Occupational Health - Occupational Stress Questionnaire    Feeling of Stress : Not at all  Social Connections: Socially Isolated (10/05/2022)   Social Connection and Isolation Panel [NHANES]    Frequency of Communication with Friends and Family: More than three  times a week    Frequency of Social Gatherings with Friends and Family: Twice a week    Attends Religious Services: Never    Database administrator or Organizations: No    Attends Banker Meetings: Never    Marital Status: Widowed    Tobacco Counseling Counseling given: Not Answered Tobacco comments: Former smoker 01/13/2021   Clinical Intake:  Pre-visit preparation completed: Yes  Pain : 0-10 Pain Score: 4  Pain Type: Acute pain Pain Location: Knee Pain Orientation: Left Pain Descriptors / Indicators: Aching Pain Onset: 1 to 4 weeks ago Pain Frequency: Constant     Nutritional Risks: None Diabetes: No  How often do you need to have someone help you when you read instructions, pamphlets, or other written materials from your doctor or pharmacy?: 1 - Never  Interpreter Needed?: No  Information entered by :: NAllen LPN   Activities of Daily Living    10/05/2022   10:32 AM 11/15/2021    2:07 PM  In your present state of health, do you have any difficulty performing the following activities:  Hearing? 0 0  Vision? 0 0  Difficulty concentrating or making decisions? 0 0  Walking or climbing stairs? 1 0  Comment due to bakers cyst   Dressing or bathing? 0 0  Doing errands, shopping? 0 0  Preparing Food and eating ? N N  Using the Toilet? N N  In the past six months, have you accidently leaked urine? N N  Do you  have problems with loss of bowel control? N N  Managing your Medications? N N  Managing your Finances? N N  Housekeeping or managing your Housekeeping? N N    Patient Care Team: Tower, Audrie Gallus, MD as PCP - General Elberta Fortis, Andree Coss, MD as PCP - Electrophysiology (Cardiology) Glendale Chard, DO as Consulting Physician (Neurology) Kathyrn Sheriff, [facility] (Inactive) as Pharmacist (Pharmacist) [facility], [facility]  Indicate any recent Medical Services you may have received from other than [facility] providers in the past year (date may be approximate).     Assessment:   This is a routine wellness examination for [place].  Hearing/Vision screen Hearing Screening - Comments:: Denies hearing issues Vision Screening - Comments:: Regular eye exams, [facility]   Goals Addressed             This Visit's Progress    Patient Stated       10/05/2022, keep arthritis under control       Depression Screen    10/05/2022   10:38 AM 10/03/2022   12:01 PM 09/08/2022    9:33 AM 02/14/2022   11:08 AM 11/15/2021    2:10 PM 09/06/2021    8:27 AM 09/02/2021    9:36 AM  PHQ 2/9 Scores  PHQ - 2 Score 0 0 0 0 0 0 0  PHQ- 9 Score  0 1 3       Fall Risk    10/05/2022   10:37 AM 10/03/2022   12:01 PM 09/08/2022    9:33 AM 02/14/2022   11:08 AM 11/15/2021    2:07 PM  Fall Risk   Falls in the past year? 0 0 0 0 0  Number falls in past yr: 0 0 0 0 0  Injury with Fall? 0 0 0 0 0  Risk for fall due to : No Fall Risks;Medication side effect No Fall Risks No Fall Risks No Fall Risks No Fall  Risks  Follow up Falls prevention discussed;Falls evaluation completed Falls evaluation completed Falls evaluation completed Falls evaluation completed Falls evaluation completed    MEDICARE RISK AT HOME: Medicare Risk at Home Any stairs in or around the home?: Yes If so, are there any without handrails?: No Home free of loose throw rugs in walkways, pet beds, electrical  cords, etc?: Yes Adequate lighting in your home to reduce risk of falls?: Yes Life alert?: No Use of a cane, walker or w/c?: No Grab bars in the bathroom?: Yes Shower chair or bench in shower?: No Elevated toilet seat or a handicapped toilet?: Yes  TIMED UP AND GO:  Was the test performed?  No    Cognitive Function:        10/05/2022   10:38 AM 11/15/2021    2:17 PM  6CIT Screen  What Year? 0 points 0 points  What month? 0 points 0 points  What time? 0 points 0 points  Count back from 20 0 points 0 points  Months in reverse 0 points 0 points  Repeat phrase 0 points 0 points  Total Score 0 points 0 points    Immunizations Immunization History  Administered Date(s) Administered   Influenza Inj Mdck Quad Pf 10/14/2017   Influenza Split 11/24/2010   Influenza, High Dose Seasonal PF 10/10/2019   Influenza, Seasonal, Injecte, Preservative Fre 10/21/2015   Influenza,inj,Quad PF,6+ Mos 10/27/2016, 09/27/2018   Influenza-Unspecified 10/18/2013, 11/01/2014, 11/05/2020   Moderna Sars-Covid-2 Vaccination 04/06/2019, 05/04/2019   Td 04/17/2002   Tdap 05/11/2012    TDAP status: Due, Education has been provided regarding the importance of this vaccine. Advised may receive this vaccine at local pharmacy or Health Dept. Aware to provide a copy of the vaccination record if obtained from local pharmacy or Health Dept. Verbalized acceptance and understanding.  Flu Vaccine status: Declined, Education has been provided regarding the importance of this vaccine but patient still declined. Advised may receive this vaccine at local pharmacy or Health Dept. Aware to provide a copy of the vaccination record if obtained from local pharmacy or Health Dept. Verbalized acceptance and understanding.  Pneumococcal vaccine status: Declined,  Education has been provided regarding the importance of this vaccine but patient still declined. Advised may receive this vaccine at local pharmacy or Health Dept.  Aware to provide a copy of the vaccination record if obtained from local pharmacy or Health Dept. Verbalized acceptance and understanding.   Covid-19 vaccine status: Declined, Education has been provided regarding the importance of this vaccine but patient still declined. Advised may receive this vaccine at local pharmacy or Health Dept.or vaccine clinic. Aware to provide a copy of the vaccination record if obtained from local pharmacy or Health Dept. Verbalized acceptance and understanding.  Qualifies for Shingles Vaccine? Yes   Zostavax completed No   Shingrix Completed?: No.    Education has been provided regarding the importance of this vaccine. Patient has been advised to call insurance company to determine out of pocket expense if they have not yet received this vaccine. Advised may also receive vaccine at local pharmacy or Health Dept. Verbalized acceptance and understanding.  Screening Tests Health Maintenance  Topic Date Due   DTaP/Tdap/Td (3 - Td or Tdap) 05/12/2022   Hepatitis C Screening  11/16/2022 (Originally 03/11/1973)   Zoster Vaccines- Shingrix (1 of 2) 12/09/2022 (Originally 03/11/1974)   INFLUENZA VACCINE  04/17/2023 (Originally 08/18/2022)   Pneumonia Vaccine 72+ Years old (1 of 1 - PCV) 09/08/2023 (Originally 03/12/2019)   COVID-19 Vaccine (  3 - Moderna risk series) 10/19/2023 (Originally 06/01/2019)   MAMMOGRAM  10/13/2022   Medicare Annual Wellness (AWV)  10/05/2023   Fecal DNA (Cologuard)  09/11/2025   DEXA SCAN  Completed   HPV VACCINES  Aged Out   COLON CANCER SCREENING ANNUAL FOBT  Discontinued    Health Maintenance  Health Maintenance Due  Topic Date Due   DTaP/Tdap/Td (3 - Td or Tdap) 05/12/2022    Colorectal cancer screening: Type of screening: Cologuard. Completed 09/12/2022. Repeat every 3 years  Mammogram status: scheduled for 10/14/2022  Bone Density status: Completed 05/28/2012.   Lung Cancer Screening: (Low Dose CT Chest recommended if Age 3-80 years,  20 pack-year currently smoking OR have quit w/in 15years.) does not qualify.   Lung Cancer Screening Referral: no  Additional Screening:  Hepatitis C Screening: does not qualify;   Vision Screening: Recommended annual ophthalmology exams for early detection of glaucoma and other disorders of the eye. Is the patient up to date with their annual eye exam?  Yes  Who is the provider or what is the name of the office in which the patient attends annual eye exams? [facility] If pt is not established with a provider, would they like to be referred to a provider to establish care? No .   Dental Screening: Recommended annual dental exams for proper oral hygiene  Diabetic Foot Exam: n/a  Community Resource Referral / Chronic Care Management: CRR required this visit?  No   CCM required this visit?  No     Plan:     I have personally reviewed and noted the following in the patient's chart:   Medical and social history Use of alcohol, tobacco or illicit drugs  Current medications and supplements including opioid prescriptions. Patient is not currently taking opioid prescriptions. Functional ability and status Nutritional status Physical activity Advanced directives List of other physicians Hospitalizations, surgeries, and ER visits in previous 12 months Vitals Screenings to include cognitive, depression, and falls Referrals and appointments  In addition, I have reviewed and discussed with patient certain preventive protocols, quality metrics, and best practice recommendations. A written personalized care plan for preventive services as well as general preventive health recommendations were provided to patient.     Barb Merino, LPN   [id number]   After Visit Summary: (MyChart) Due to this being a telephonic visit, the after visit summary with patients personalized plan was offered to patient via MyChart   Nurse Notes: none

## 2022-10-05 NOTE — Patient Instructions (Signed)
Denise Grant , Thank you for taking time to come for your Medicare Wellness Visit. I appreciate your ongoing commitment to your health goals. Please review the following plan we discussed and let me know if I can assist you in the future.   Referrals/Orders/Follow-Ups/Clinician Recommendations: none  This is a list of the screening recommended for you and due dates:  Health Maintenance  Topic Date Due   DTaP/Tdap/Td vaccine (3 - Td or Tdap) 05/12/2022   Hepatitis C Screening  11/16/2022*   Zoster (Shingles) Vaccine (1 of 2) 12/09/2022*   Flu Shot  04/17/2023*   Pneumonia Vaccine (1 of 1 - PCV) 09/08/2023*   COVID-19 Vaccine (3 - Moderna risk series) 10/19/2023*   Mammogram  10/13/2022   Medicare Annual Wellness Visit  10/05/2023   Cologuard (Stool DNA test)  09/11/2025   DEXA scan (bone density measurement)  Completed   HPV Vaccine  Aged Out   Stool Blood Test  Discontinued  *Topic was postponed. The date shown is not the original due date.    Advanced directives: (Copy Requested) Please bring a copy of your health care power of attorney and living will to the office to be added to your chart at your convenience.  Next Medicare Annual Wellness Visit scheduled for next year: Yes  Insert Preventive Care attachment Insert FALL PREVENTION attachment if needed

## 2022-10-13 DIAGNOSIS — M1712 Unilateral primary osteoarthritis, left knee: Secondary | ICD-10-CM | POA: Diagnosis not present

## 2022-10-13 DIAGNOSIS — M7122 Synovial cyst of popliteal space [Baker], left knee: Secondary | ICD-10-CM | POA: Diagnosis not present

## 2022-10-18 ENCOUNTER — Other Ambulatory Visit: Payer: Self-pay | Admitting: Family Medicine

## 2022-10-25 NOTE — Progress Notes (Unsigned)
Electrophysiology Office Note:   Date:  10/26/2022  ID:  Denise Grant, DOB 1955/09/25, MRN [id number]  Primary Cardiologist: None Electrophysiologist: Sesar Madewell Jorja Loa, MD      History of Present Illness:   Denise Grant is a 67 y.o. female with h/o Chiari malformation, polymyalgia rheumatica, hypertension, hypothyroidism, mitral valve prolapse, anxiety, atrial fibrillation post ablation 11/09/2021 seen today for routine electrophysiology followup.   Since last being seen in our clinic the patient reports doing well.  She has noted no further episodes of atrial fibrillation.  She is quite happy with her control.  Her biggest issue is her arthritis.  She would like to go back on methotrexate to avoid prednisone.  She has also noticed some lower extremity edema that leaves sock rings at the end of the day.  She understands that this is not an abnormal occurrence.  she denies chest pain, palpitations, dyspnea, PND, orthopnea, nausea, vomiting, dizziness, syncope, edema, weight gain, or early satiety.   Review of systems complete and found to be negative unless listed in HPI.   EP Information / Studies Reviewed:    EKG is ordered today. Personal review as below.  EKG Interpretation Date/Time:  Wednesday October 26 2022 09:09:45 EDT Ventricular Rate:  77 PR Interval:  142 QRS Duration:  82 QT Interval:  374 QTC Calculation: 423 R Axis:   0  Text Interpretation: Normal sinus rhythm Cannot rule out Inferior infarct (cited on or before 31-Aug-2021) Possible Anterior infarct (cited on or before 31-Aug-2021) When compared with ECG of 07-Dec-2021 10:16, QRS axis Shifted right Confirmed by Jennalynn Rivard ([id number]) on 10/26/2022 9:13:12 AM    Risk Assessment/Calculations:    CHA2DS2-VASc Score = 3   This indicates a 3.2% annual risk of stroke. The patient's score is based upon: CHF History: 0 HTN History: 1 Diabetes History: 0 Stroke History: 0 Vascular Disease History: 0 Age Score:  1 Gender Score: 1             Physical Exam:   VS:  BP 132/68   Pulse 77   Ht 5\' 6"  (1.676 m)   Wt 139 lb 12.8 oz (63.4 kg)   SpO2 99%   BMI 22.56 kg/m    Wt Readings from Last 3 Encounters:  10/26/22 139 lb 12.8 oz (63.4 kg)  10/03/22 136 lb 8 oz (61.9 kg)  09/08/22 136 lb 6 oz (61.9 kg)     GEN: Well nourished, well developed in no acute distress NECK: No JVD; No carotid bruits CARDIAC: Regular rate and rhythm, no murmurs, rubs, gallops RESPIRATORY:  Clear to auscultation without rales, wheezing or rhonchi  ABDOMEN: Soft, non-tender, non-distended EXTREMITIES:  No edema; No deformity   ASSESSMENT AND PLAN:    1.  Paroxysmal atrial fibrillation: Currently on Xarelto.  Post ablation 11/09/2021.  Remains in normal rhythm.  Khamari Sheehan continue with current management.  Ladeidra Borys have her see an EP app and 6 months.  If she is continuing to do well, she can follow-up a year after.  2.  Secondary hypercoagulable state: Currently on Xarelto for atrial fibrillation  3.  Hypertension: Currently well-controlled  Follow up with EP APP in 6 months  Signed, Coti Burd Jorja Loa, MD

## 2022-10-26 ENCOUNTER — Ambulatory Visit: Payer: Medicare HMO | Attending: Cardiology | Admitting: Cardiology

## 2022-10-26 ENCOUNTER — Encounter: Payer: Self-pay | Admitting: Cardiology

## 2022-10-26 VITALS — BP 132/68 | HR 77 | Ht 66.0 in | Wt 139.8 lb

## 2022-10-26 DIAGNOSIS — I480 Paroxysmal atrial fibrillation: Secondary | ICD-10-CM

## 2022-10-26 DIAGNOSIS — D6869 Other thrombophilia: Secondary | ICD-10-CM

## 2022-10-26 DIAGNOSIS — I10 Essential (primary) hypertension: Secondary | ICD-10-CM | POA: Diagnosis not present

## 2022-10-27 ENCOUNTER — Ambulatory Visit
Admission: RE | Admit: 2022-10-27 | Discharge: 2022-10-27 | Disposition: A | Discharge status: Home / Self Care | Payer: Medicare HMO | Source: Ambulatory Visit | Attending: Family Medicine | Admitting: Family Medicine

## 2022-10-27 DIAGNOSIS — Z1231 Encounter for screening mammogram for malignant neoplasm of breast: Secondary | ICD-10-CM | POA: Insufficient documentation

## 2022-10-27 HISTORY — PX: BREAST BIOPSY: SHX20

## 2022-10-31 ENCOUNTER — Other Ambulatory Visit: Payer: Self-pay | Admitting: Family Medicine

## 2022-10-31 DIAGNOSIS — R928 Other abnormal and inconclusive findings on diagnostic imaging of breast: Secondary | ICD-10-CM

## 2022-11-01 DIAGNOSIS — M353 Polymyalgia rheumatica: Secondary | ICD-10-CM | POA: Diagnosis not present

## 2022-11-15 ENCOUNTER — Ambulatory Visit
Admission: RE | Admit: 2022-11-15 | Discharge: 2022-11-15 | Disposition: A | Discharge status: Home / Self Care | Payer: Medicare HMO | Source: Ambulatory Visit | Attending: Family Medicine | Admitting: Family Medicine

## 2022-11-15 DIAGNOSIS — R928 Other abnormal and inconclusive findings on diagnostic imaging of breast: Secondary | ICD-10-CM

## 2022-11-15 DIAGNOSIS — M069 Rheumatoid arthritis, unspecified: Secondary | ICD-10-CM | POA: Diagnosis not present

## 2022-11-15 DIAGNOSIS — R590 Localized enlarged lymph nodes: Secondary | ICD-10-CM | POA: Diagnosis not present

## 2022-12-13 DIAGNOSIS — M353 Polymyalgia rheumatica: Secondary | ICD-10-CM | POA: Diagnosis not present

## 2022-12-31 ENCOUNTER — Other Ambulatory Visit: Payer: Self-pay | Admitting: Family Medicine

## 2023-01-23 ENCOUNTER — Telehealth (HOSPITAL_COMMUNITY): Payer: Self-pay | Admitting: *Deleted

## 2023-01-23 NOTE — Telephone Encounter (Signed)
 Patient approved for xarelto patient assistance through 01/17/24.

## 2023-02-20 ENCOUNTER — Other Ambulatory Visit: Payer: Self-pay | Admitting: Family Medicine

## 2023-02-21 NOTE — Telephone Encounter (Signed)
CPE was on 09/08/22,   Last filled on 10/18/22 # 15 tabs/ 3 refills

## 2023-03-28 DIAGNOSIS — M353 Polymyalgia rheumatica: Secondary | ICD-10-CM | POA: Diagnosis not present

## 2023-03-28 DIAGNOSIS — M0579 Rheumatoid arthritis with rheumatoid factor of multiple sites without organ or systems involvement: Secondary | ICD-10-CM | POA: Diagnosis not present

## 2023-03-29 ENCOUNTER — Other Ambulatory Visit (HOSPITAL_COMMUNITY): Payer: Self-pay | Admitting: *Deleted

## 2023-03-29 MED ORDER — RIVAROXABAN 20 MG PO TABS: 20.0000 mg | ORAL_TABLET | Freq: Every day | ORAL | 2 refills | Status: DC

## 2023-04-13 ENCOUNTER — Ambulatory Visit: Admitting: Dermatology

## 2023-04-13 ENCOUNTER — Ambulatory Visit: Payer: Medicare HMO | Admitting: Dermatology

## 2023-04-13 DIAGNOSIS — D1801 Hemangioma of skin and subcutaneous tissue: Secondary | ICD-10-CM | POA: Diagnosis not present

## 2023-04-13 DIAGNOSIS — L821 Other seborrheic keratosis: Secondary | ICD-10-CM

## 2023-04-13 DIAGNOSIS — Z872 Personal history of diseases of the skin and subcutaneous tissue: Secondary | ICD-10-CM | POA: Diagnosis not present

## 2023-04-13 DIAGNOSIS — L719 Rosacea, unspecified: Secondary | ICD-10-CM

## 2023-04-13 DIAGNOSIS — L578 Other skin changes due to chronic exposure to nonionizing radiation: Secondary | ICD-10-CM | POA: Diagnosis not present

## 2023-04-13 DIAGNOSIS — W908XXA Exposure to other nonionizing radiation, initial encounter: Secondary | ICD-10-CM

## 2023-04-13 DIAGNOSIS — Z1283 Encounter for screening for malignant neoplasm of skin: Secondary | ICD-10-CM

## 2023-04-13 DIAGNOSIS — L820 Inflamed seborrheic keratosis: Secondary | ICD-10-CM

## 2023-04-13 DIAGNOSIS — L720 Epidermal cyst: Secondary | ICD-10-CM | POA: Diagnosis not present

## 2023-04-13 DIAGNOSIS — L814 Other melanin hyperpigmentation: Secondary | ICD-10-CM | POA: Diagnosis not present

## 2023-04-13 DIAGNOSIS — L570 Actinic keratosis: Secondary | ICD-10-CM

## 2023-04-13 DIAGNOSIS — D229 Melanocytic nevi, unspecified: Secondary | ICD-10-CM

## 2023-04-13 MED ORDER — DOXYCYCLINE MONOHYDRATE 100 MG PO CAPS: 100.0000 mg | ORAL_CAPSULE | Freq: Every day | ORAL | 1 refills | Status: DC

## 2023-04-13 MED ORDER — METRONIDAZOLE 0.75 % EX CREA: TOPICAL_CREAM | Freq: Two times a day (BID) | CUTANEOUS | 11 refills | Status: AC

## 2023-04-13 NOTE — Patient Instructions (Signed)

## 2023-04-13 NOTE — Progress Notes (Signed)
 Follow-Up Visit   Subjective  Denise Grant is a 68 y.o. female who presents for the following: Skin Cancer Screening and Full Body Skin Exam, hx of precancers, patient c/o dry places on her face she is concerned about today  The patient presents for Total-Body Skin Exam (TBSE) for skin cancer screening and mole check. The patient has spots, moles and lesions to be evaluated, some may be new or changing and the patient may have concern these could be cancer.  The following portions of the chart were reviewed this encounter and updated as appropriate: medications, allergies, medical history  Review of Systems:  No other skin or systemic complaints except as noted in HPI or Assessment and Plan.  Objective  Well appearing patient in no apparent distress; mood and affect are within normal limits.  A full examination was performed including scalp, head, eyes, ears, nose, lips, neck, chest, axillae, abdomen, back, buttocks, bilateral upper extremities, bilateral lower extremities, hands, feet, fingers, toes, fingernails, and toenails. All findings within normal limits unless otherwise noted below.   Relevant physical exam findings are noted in the Assessment and Plan.  R lower nasal dorsum x 1, R upper lip x 1 (2) Erythematous thin papules/macules with gritty scale.  R cheek Smooth white papule(s).  R ant ankle x 1 Erythematous stuck-on, waxy papule       Assessment & Plan   SKIN CANCER SCREENING PERFORMED TODAY.  ACTINIC DAMAGE - Chronic condition, secondary to cumulative UV/sun exposure - diffuse scaly erythematous macules with underlying dyspigmentation - Recommend daily broad spectrum sunscreen SPF 30+ to sun-exposed areas, reapply every 2 hours as needed.  - Staying in the shade or wearing long sleeves, sun glasses (UVA+UVB protection) and wide brim hats (4-inch brim around the entire circumference of the hat) are also recommended for sun protection.  - Call for new or  changing lesions.  LENTIGINES, SEBORRHEIC KERATOSES, HEMANGIOMAS - Benign normal skin lesions - Benign-appearing - Call for any changes  MELANOCYTIC NEVI - Tan-brown and/or pink-flesh-colored symmetric macules and papules - Benign appearing on exam today - Observation - Call clinic for new or changing moles - Recommend daily use of broad spectrum spf 30+ sunscreen to sun-exposed areas.   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 L nasal tip indistinct pink/yellow induration, benign features under dermoscopy. Photos taken today  Chronic and persistent condition with duration or expected duration over one year. Condition is symptomatic/ bothersome to patient. Not currently at goal.  Rosacea is a chronic progressive skin condition usually affecting the face of adults, causing redness and/or acne bumps. It is treatable but not curable. It sometimes affects the eyes (ocular rosacea) as well. It may respond to topical and/or systemic medication and can flare with stress, sun exposure, alcohol, exercise, topical steroids (including hydrocortisone/cortisone 10) and some foods.  Daily application of broad spectrum spf 30+ sunscreen to face is recommended to reduce flares.  Treatment Plan Start Metronidazole 0.75% cream BID.  Start Doxycycline Monohydrate 100 mg po QD. Doxycycline should be taken with food to prevent nausea. Do not lay down for 30 minutes after taking. Be cautious with sun exposure and use good sun protection while on this medication. Pregnant women should not take this medication.  AK (ACTINIC KERATOSIS) (2) R lower nasal dorsum x 1, R upper lip x 1 (2) Actinic keratoses are precancerous spots that appear secondary to cumulative UV radiation exposure/sun exposure over time. They are chronic with expected duration over  1 year. A portion of actinic keratoses will progress to squamous cell carcinoma of the skin. It is not possible to  reliably predict which spots will progress to skin cancer and so treatment is recommended to prevent development of skin cancer.  Recommend daily broad spectrum sunscreen SPF 30+ to sun-exposed areas, reapply every 2 hours as needed.  Recommend staying in the shade or wearing long sleeves, sun glasses (UVA+UVB protection) and wide brim hats (4-inch brim around the entire circumference of the hat). Call for new or changing lesions.  Destruction of lesion - R lower nasal dorsum x 1, R upper lip x 1 (2)  Destruction method: cryotherapy   Informed consent: discussed and consent obtained   Lesion destroyed using liquid nitrogen: Yes   Region frozen until ice ball extended beyond lesion: Yes   Outcome: patient tolerated procedure well with no complications   Post-procedure details: wound care instructions given   MILIA R cheek Symptomatic, irritating, patient would like treated.  Acne/Milia surgery - R cheek Procedure risks and benefits were discussed with the patient and verbal consent was obtained. Following prep of the skin on the R cheek with an alcohol swab and local anesthesia injection, extraction of milia was performed with a comedone extractor following superficial incision made over their surfaces with a #11 surgical blade. Capillary hemostasis was achieved with 20% aluminum chloride solution. Vaseline ointment was applied to each site. The patient tolerated the procedure well. INFLAMED SEBORRHEIC KERATOSIS R ant ankle x 1 Symptomatic, irritating, patient would like treated.  Destruction of lesion - R ant ankle x 1  Destruction method: cryotherapy   Informed consent: discussed and consent obtained   Lesion destroyed using liquid nitrogen: Yes   Region frozen until ice ball extended beyond lesion: Yes   Outcome: patient tolerated procedure well with no complications   Post-procedure details: wound care instructions given   Additional details:  Prior to procedure, discussed risks of  blister formation, small wound, skin dyspigmentation, or rare scar following cryotherapy. Recommend Vaseline ointment to treated areas while healing.   Return in about 1 year (around 04/12/2024) for TBSE; 3 mth for nose recheck.  I, Angelique Holm, CMA, am acting as scribe for Willeen Niece, MD .   Documentation: I have reviewed the above documentation for accuracy and completeness, and I agree with the above.  Willeen Niece, MD

## 2023-04-24 ENCOUNTER — Ambulatory Visit: Payer: Medicare HMO | Admitting: Pulmonary Disease

## 2023-04-25 NOTE — Progress Notes (Unsigned)
 Electrophysiology Clinic Note    Date:  04/26/2023  Patient ID:  Denise Grant 1955/02/16, MRN [id number] PCP:  Judy Pimple, MD  Cardiologist:  None Electrophysiologist: Will Jorja Loa, MD   Discussed the use of AI scribe software for clinical note transcription with the patient, who gave verbal consent to proceed.   Patient Profile    Chief Complaint: AF follow-up  History of Present Illness: Denise Grant is a 68 y.o. female with PMH notable for parox AFib, HTN, RA; seen today for Will Jorja Loa, MD for routine electrophysiology followup.   She is s/p AF ablation w PVI, posterior wall on 10/2021 by Dr. Elberta Fortis.  She last saw Dr. Elberta Fortis 10/2022 and was doing well without AF episodes at that time.   On follow-up today, she has not had any AF episodes like her episodes prior to ablation. She has had very brief episodes of palpitations, maybe 2-3 since last visit with Dr. Elberta Fortis. She wears a smart watch that has not alerted her to any AFib. She continues to take xarelto daily, no bleeding concerns. She tries to take 1/4 of her toprol tablet, but it is difficult to cut.  She checks BP at home, most readings 130s/70-80s.    Arrhythmia/Device History No specialty comments available.     ROS:  Please see the history of present illness. All other systems are reviewed and otherwise negative.    Physical Exam    VS:  BP (!) 144/80 (BP Location: Left Arm, Patient Position: Sitting, Cuff Size: Normal)   Pulse 76   Ht 5\' 6"  (1.676 m)   Wt 139 lb 6.4 oz (63.2 kg)   SpO2 96%   BMI 22.50 kg/m  BMI: Body mass index is 22.5 kg/m.  Wt Readings from Last 3 Encounters:  04/26/23 139 lb 6.4 oz (63.2 kg)  10/26/22 139 lb 12.8 oz (63.4 kg)  10/03/22 136 lb 8 oz (61.9 kg)     GEN- The patient is well appearing, alert and oriented x 3 today.   Lungs- Clear to ausculation bilaterally, normal work of breathing.  Heart- Regular rate and rhythm, no murmurs, rubs  or gallops Extremities- No peripheral edema, warm, dry    Studies Reviewed   Previous EP, cardiology notes.    EKG is ordered. Personal review of EKG from today shows:    EKG Interpretation Date/Time:  Wednesday April 26 2023 10:35:42 EDT Ventricular Rate:  76 PR Interval:  140 QRS Duration:  78 QT Interval:  380 QTC Calculation: 427 R Axis:   -29  Text Interpretation: Normal sinus rhythm Confirmed by Sherie Don [phone]) on 04/26/2023 10:38:25 AM     TTE, 08/23/2021  1. Left ventricular ejection fraction, by estimation, is 65 to 70%. The left ventricle has normal function. The left ventricle has no regional wall motion abnormalities. There is mild left ventricular hypertrophy. Left ventricular diastolic parameters were normal.   2. Right ventricular systolic function is normal. The right ventricular size is normal. Tricuspid regurgitation signal is inadequate for assessing PA pressure.   3. The mitral valve is normal in structure. Trivial mitral valve regurgitation. No evidence of mitral stenosis.   4. The aortic valve is normal in structure. Aortic valve regurgitation is trivial. Aortic valve sclerosis is present, with no evidence of aortic valve stenosis.   5. The inferior vena cava is normal in size with greater than 50% respiratory variability, suggesting right atrial pressure of 3 mmHg.  Assessment and Plan     #) parox AFib S/p AF Ablation 10/2021 by Dr. Elberta Fortis Rare, brief episodes of palpitations, likely PAC vs PVCs. She will try to capture episode on smart watch and send via mychart for review Will increase toprol to 1/2 pill for 12.5mg  daily d/t difficulty cutting tablet into 1/4   #) Hypercoag d/t parox afib CHA2DS2-VASc Score = at least 3 [CHF History: 0, HTN History: 1, Diabetes History: 0, Stroke History: 0, Vascular Disease History: 0, Age Score: 1, Gender Score: 1].  Therefore, the patient's annual risk of stroke is 3.2 %.    Stroke ppx - 20mg  xarelto,  appropriately dosed for CrCL > 61ml/min No bleeding concerns         Current medicines are reviewed at length with the patient today.   The patient does not have concerns regarding her medicines.  The following changes were made today:   INCREASE toprol to 12.5mg  daily  Labs/ tests ordered today include:  Orders Placed This Encounter  Procedures   EKG 12-Lead     Disposition: Follow up with Dr. Elberta Fortis or EP APP in 6-9 months, sooner if needed   Signed, Sherie Don, NP  04/26/23  11:01 AM  Electrophysiology [facility]

## 2023-04-26 ENCOUNTER — Ambulatory Visit: Attending: Cardiology | Admitting: Cardiology

## 2023-04-26 VITALS — BP 144/80 | HR 76 | Ht 66.0 in | Wt 139.4 lb

## 2023-04-26 DIAGNOSIS — D6869 Other thrombophilia: Secondary | ICD-10-CM

## 2023-04-26 DIAGNOSIS — I480 Paroxysmal atrial fibrillation: Secondary | ICD-10-CM

## 2023-04-26 MED ORDER — METOPROLOL SUCCINATE ER 25 MG PO TB24: 12.5000 mg | ORAL_TABLET | Freq: Every day | ORAL | 1 refills | Status: AC

## 2023-04-26 NOTE — Patient Instructions (Signed)
 Medication Instructions:  Take 12.5 mg Metoprolol Succinate daily   *If you need a refill on your cardiac medications before your next appointment, please call your pharmacy*  Follow-Up: At [facility], you and your health needs are our priority.  As part of our continuing mission to provide you with exceptional heart care, our providers are all part of one team.  This team includes your primary Cardiologist (physician) and Advanced Practice Providers or APPs (Physician Assistants and Nurse Practitioners) who all work together to provide you with the care you need, when you need it.  Your next appointment:   6 month(s)  Provider:   Sherie Don, NP   We recommend signing up for the patient portal called "MyChart".  Sign up information is provided on this After Visit Summary.  MyChart is used to connect with patients for Virtual Visits (Telemedicine).  Patients are able to view lab/test results, encounter notes, upcoming appointments, etc.  Non-urgent messages can be sent to your provider as well.   To learn more about what you can do with MyChart, go to [email or web address].

## 2023-06-22 ENCOUNTER — Encounter: Payer: Medicare HMO | Admitting: Pharmacist

## 2023-06-23 ENCOUNTER — Other Ambulatory Visit: Payer: Self-pay | Admitting: Family Medicine

## 2023-06-23 NOTE — Telephone Encounter (Signed)
 Name of Medication: Ativan  Name of Pharmacy: [facility] Last Fill or Written Date and Quantity: 02/21/23 #15 tabs/ 3 refills   Next Office Visit and Type: CPE 10/09/23

## 2023-06-30 ENCOUNTER — Other Ambulatory Visit: Payer: Self-pay | Admitting: Family Medicine

## 2023-07-04 DIAGNOSIS — Z7960 Long term (current) use of unspecified immunomodulators and immunosuppressants: Secondary | ICD-10-CM | POA: Diagnosis not present

## 2023-07-04 DIAGNOSIS — M720 Palmar fascial fibromatosis [Dupuytren]: Secondary | ICD-10-CM | POA: Diagnosis not present

## 2023-07-04 DIAGNOSIS — M353 Polymyalgia rheumatica: Secondary | ICD-10-CM | POA: Diagnosis not present

## 2023-07-04 DIAGNOSIS — M0579 Rheumatoid arthritis with rheumatoid factor of multiple sites without organ or systems involvement: Secondary | ICD-10-CM | POA: Diagnosis not present

## 2023-07-17 DIAGNOSIS — M720 Palmar fascial fibromatosis [Dupuytren]: Secondary | ICD-10-CM | POA: Diagnosis not present

## 2023-07-25 ENCOUNTER — Ambulatory Visit: Admitting: Dermatology

## 2023-07-25 DIAGNOSIS — L578 Other skin changes due to chronic exposure to nonionizing radiation: Secondary | ICD-10-CM | POA: Diagnosis not present

## 2023-07-25 DIAGNOSIS — L570 Actinic keratosis: Secondary | ICD-10-CM

## 2023-07-25 DIAGNOSIS — Z872 Personal history of diseases of the skin and subcutaneous tissue: Secondary | ICD-10-CM

## 2023-07-25 DIAGNOSIS — L304 Erythema intertrigo: Secondary | ICD-10-CM

## 2023-07-25 DIAGNOSIS — W908XXA Exposure to other nonionizing radiation, initial encounter: Secondary | ICD-10-CM

## 2023-07-25 DIAGNOSIS — Z79899 Other long term (current) drug therapy: Secondary | ICD-10-CM

## 2023-07-25 MED ORDER — HYDROCORTISONE 2.5 % EX CREA: TOPICAL_CREAM | Freq: Two times a day (BID) | CUTANEOUS | 2 refills | Status: AC | PRN

## 2023-07-25 MED ORDER — KETOCONAZOLE 2 % EX CREA: 1.0000 | TOPICAL_CREAM | Freq: Two times a day (BID) | CUTANEOUS | 2 refills | Status: AC

## 2023-07-25 NOTE — Progress Notes (Signed)
 Follow-Up Visit   Subjective  Denise Grant is a 68 y.o. female who presents for the following: 3 month AK follow up at right lower nasal dorsum, patient advises spot has improved. She does have a rough spot at chest and rash under breasts, using OTC eczema cream and cornstarch.   The following portions of the chart were reviewed this encounter and updated as appropriate: medications, allergies, medical history  Review of Systems:  No other skin or systemic complaints except as noted in HPI or Assessment and Plan.  Objective  Well appearing patient in no apparent distress; mood and affect are within normal limits.   A focused examination was performed of the following areas: Face, chest, inframammary  Relevant exam findings are noted in the Assessment and Plan.  R anterior alar crease x 1, upper sternum x 5, nasal tip x 1 (7) Pink, slightly scaly macules, small residual at right nasal tip  Assessment & Plan   HISTORY OF PRECANCEROUS ACTINIC KERATOSIS - site(s) of PreCancerous Actinic Keratosis clear today. - these may recur and new lesions may form requiring treatment to prevent transformation into skin cancer - observe for new or changing spots and contact [facility] for appointment if occur - photoprotection with sun protective clothing; sunglasses and broad spectrum sunscreen with SPF of at least 30 + and frequent self skin exams recommended - yearly exams by a dermatologist recommended for persons with history of PreCancerous Actinic Keratoses  INTERTRIGO Exam: erythematous patches inframammary  Chronic and persistent condition with duration or expected duration over one year. Condition is bothersome/symptomatic for patient. Currently flared.   Intertrigo is a chronic recurrent rash that occurs in skin fold areas that may be associated with friction; heat; moisture; yeast; fungus; and bacteria.  It is exacerbated by increased movement / activity; sweating; and  higher atmospheric temperature.  Use of an absorbant powder such as Zeasorb AF powder or other OTC antifungal powder to the area daily can prevent rash recurrence. Other options to help keep the area dry include blow drying the area after bathing or using antiperspirant products such as Duradry sweat minimizing gel.   Treatment Plan: Start ketoconazole  2% cream once or twice a day as needed for rash.  Start hydrocortisone  2.5% cream once or twice a day for up to 2 weeks as needed for rash.   Mix equal amounts of hydrocortisone  2.5% cream with ketaconazole 2% cream and apply to affected areas twice a day.  If improved, decrease to hydrocortisone  and ketaconazole mixed once a day. If still clear, decrease to ketaconazole cream only, once daily to help prevent flares.  Do not use the hydrocortisone  cream for maintenance, since long term use of a topical steroid can thin the skin.  May repeat regimen as needed for flares.   Topical steroids (such as triamcinolone , fluocinolone, fluocinonide, mometasone , clobetasol, halobetasol, betamethasone , hydrocortisone ) can cause thinning and lightening of the skin if they are used for too long in the same area. Your physician has selected the right strength medicine for your problem and area affected on the body. Please use your medication only as directed by your physician to prevent side effects.   To prevent recurrence, recommend OTC Zeasorb AF powder to body folds daily after shower.  It is often found in the athlete's foot section in the pharmacy.  Avoid using powders that contain cornstarch.   ACTINIC DAMAGE - chronic, secondary to cumulative UV radiation exposure/sun exposure over time - diffuse scaly erythematous macules  with underlying dyspigmentation - Recommend daily broad spectrum sunscreen SPF 30+ to sun-exposed areas, reapply every 2 hours as needed.  - Recommend staying in the shade or wearing long sleeves, sun glasses (UVA+UVB protection) and wide  brim hats (4-inch brim around the entire circumference of the hat). - Call for new or changing lesions.  AK (ACTINIC KERATOSIS) (7) R anterior alar crease x 1, upper sternum x 5, nasal tip x 1 (7) Actinic keratoses are precancerous spots that appear secondary to cumulative UV radiation exposure/sun exposure over time. They are chronic with expected duration over 1 year. A portion of actinic keratoses will progress to squamous cell carcinoma of the skin. It is not possible to reliably predict which spots will progress to skin cancer and so treatment is recommended to prevent development of skin cancer.  Recommend daily broad spectrum sunscreen SPF 30+ to sun-exposed areas, reapply every 2 hours as needed.  Recommend staying in the shade or wearing long sleeves, sun glasses (UVA+UVB protection) and wide brim hats (4-inch brim around the entire circumference of the hat). Call for new or changing lesions. Destruction of lesion - R anterior alar crease x 1, upper sternum x 5, nasal tip x 1 (7)  Destruction method: cryotherapy   Informed consent: discussed and consent obtained   Lesion destroyed using liquid nitrogen: Yes   Region frozen until ice ball extended beyond lesion: Yes   Outcome: patient tolerated procedure well with no complications   Post-procedure details: wound care instructions given   Additional details:  Prior to procedure, discussed risks of blister formation, small wound, skin dyspigmentation, or rare scar following cryotherapy. Recommend Vaseline ointment to treated areas while healing.    Return in about 8 months (around 03/24/2024) for TBSE, with Dr. Jackquline, HxAK.  LILLETTE Lonell Drones, RMA, am acting as scribe for Rexene Jackquline, MD .   Documentation: I have reviewed the above documentation for accuracy and completeness, and I agree with the above.  Rexene Jackquline, MD

## 2023-07-25 NOTE — Patient Instructions (Addendum)
 Cryotherapy Aftercare  Wash gently with soap and water everyday.   Apply Vaseline and Band-Aid daily until healed.   Start ketoconazole  2% cream once or twice a day as needed for rash.  Start hydrocortisone  2.5% cream once or twice a day for up to 2 weeks as needed for rash.   Mix equal amounts of hydrocortisone  2.5% cream with ketaconazole 2% cream and apply to affected areas twice a day.  If improved, decrease to hydrocortisone  and ketaconazole mixed once a day. If still clear, decrease to ketaconazole cream only, once daily to help prevent flares.  Do not use the hydrocortisone  cream for maintenance, since long term use of a topical steroid can thin the skin.  May repeat regimen as needed for flares.   Topical steroids (such as triamcinolone , fluocinolone, fluocinonide, mometasone , clobetasol, halobetasol, betamethasone , hydrocortisone ) can cause thinning and lightening of the skin if they are used for too long in the same area. Your physician has selected the right strength medicine for your problem and area affected on the body. Please use your medication only as directed by your physician to prevent side effects.   To prevent recurrence, recommend OTC Zeasorb AF powder to body folds daily after shower.  It is often found in the athlete's foot section in the pharmacy.  Avoid using powders that contain cornstarch.

## 2023-09-04 DIAGNOSIS — Z8669 Personal history of other diseases of the nervous system and sense organs: Secondary | ICD-10-CM | POA: Diagnosis not present

## 2023-09-04 DIAGNOSIS — H35373 Puckering of macula, bilateral: Secondary | ICD-10-CM | POA: Diagnosis not present

## 2023-09-04 DIAGNOSIS — H59811 Chorioretinal scars after surgery for detachment, right eye: Secondary | ICD-10-CM | POA: Diagnosis not present

## 2023-09-04 DIAGNOSIS — H43392 Other vitreous opacities, left eye: Secondary | ICD-10-CM | POA: Diagnosis not present

## 2023-09-04 DIAGNOSIS — H35342 Macular cyst, hole, or pseudohole, left eye: Secondary | ICD-10-CM | POA: Diagnosis not present

## 2023-10-01 ENCOUNTER — Telehealth: Payer: Self-pay | Admitting: Family Medicine

## 2023-10-01 DIAGNOSIS — M810 Age-related osteoporosis without current pathological fracture: Secondary | ICD-10-CM

## 2023-10-01 DIAGNOSIS — E039 Hypothyroidism, unspecified: Secondary | ICD-10-CM

## 2023-10-01 DIAGNOSIS — I10 Essential (primary) hypertension: Secondary | ICD-10-CM

## 2023-10-01 NOTE — Telephone Encounter (Signed)
-----   Message from Veva JINNY Ferrari sent at 09/21/2023 10:31 AM EDT ----- Regarding: Lab orders for Mon, 9.15.25 Patient is scheduled for CPX labs, please order future labs, Thanks , Veva

## 2023-10-02 ENCOUNTER — Other Ambulatory Visit (INDEPENDENT_AMBULATORY_CARE_PROVIDER_SITE_OTHER)

## 2023-10-02 ENCOUNTER — Ambulatory Visit: Payer: Self-pay | Admitting: Family Medicine

## 2023-10-02 DIAGNOSIS — E039 Hypothyroidism, unspecified: Secondary | ICD-10-CM

## 2023-10-02 DIAGNOSIS — M810 Age-related osteoporosis without current pathological fracture: Secondary | ICD-10-CM

## 2023-10-02 DIAGNOSIS — I10 Essential (primary) hypertension: Secondary | ICD-10-CM | POA: Diagnosis not present

## 2023-10-02 LAB — CBC WITH DIFFERENTIAL/PLATELET
Basophils Absolute: 0 K/uL (ref 0.0–0.1)
Basophils Relative: 0.5 % (ref 0.0–3.0)
Eosinophils Absolute: 0 K/uL (ref 0.0–0.7)
Eosinophils Relative: 0.8 % (ref 0.0–5.0)
HCT: 35.3 % — ABNORMAL LOW (ref 36.0–46.0)
Hemoglobin: 11.6 g/dL — ABNORMAL LOW (ref 12.0–15.0)
Lymphocytes Relative: 14.5 % (ref 12.0–46.0)
Lymphs Abs: 0.7 K/uL (ref 0.7–4.0)
MCHC: 33 g/dL (ref 30.0–36.0)
MCV: 83.4 fl (ref 78.0–100.0)
Monocytes Absolute: 0.4 K/uL (ref 0.1–1.0)
Monocytes Relative: 8 % (ref 3.0–12.0)
Neutro Abs: 3.5 K/uL (ref 1.4–7.7)
Neutrophils Relative %: 76.2 % (ref 43.0–77.0)
Platelets: 197 K/uL (ref 150.0–400.0)
RBC: 4.22 Mil/uL (ref 3.87–5.11)
RDW: 14.7 % (ref 11.5–15.5)
WBC: 4.6 K/uL (ref 4.0–10.5)

## 2023-10-02 LAB — LIPID PANEL
Cholesterol: 158 mg/dL (ref 0–200)
HDL: 56.8 mg/dL (ref >39.00)
LDL Cholesterol: 86 mg/dL (ref 0–99)
NonHDL: 100.71
Total CHOL/HDL Ratio: 3
Triglycerides: 73 mg/dL (ref 0.0–149.0)
VLDL: 14.6 mg/dL (ref 0.0–40.0)

## 2023-10-02 LAB — COMPREHENSIVE METABOLIC PANEL WITH GFR
ALT: 17 U/L (ref 0–35)
AST: 25 U/L (ref 0–37)
Albumin: 4 g/dL (ref 3.5–5.2)
Alkaline Phosphatase: 65 U/L (ref 39–117)
BUN: 12 mg/dL (ref 6–23)
CO2: 29 meq/L (ref 19–32)
Calcium: 9.2 mg/dL (ref 8.4–10.5)
Chloride: 105 meq/L (ref 96–112)
Creatinine, Ser: 0.68 mg/dL (ref 0.40–1.20)
GFR: 88.78 mL/min (ref >60.00)
Glucose, Bld: 73 mg/dL (ref 70–99)
Potassium: 4.3 meq/L (ref 3.5–5.1)
Sodium: 140 meq/L (ref 135–145)
Total Bilirubin: 0.4 mg/dL (ref 0.2–1.2)
Total Protein: 6.2 g/dL (ref 6.0–8.3)

## 2023-10-02 LAB — VITAMIN D 25 HYDROXY (VIT D DEFICIENCY, FRACTURES): VITD: 59.44 ng/mL (ref 30.00–100.00)

## 2023-10-02 LAB — TSH: TSH: 1.04 u[IU]/mL (ref 0.35–5.50)

## 2023-10-09 ENCOUNTER — Encounter: Payer: Self-pay | Admitting: Family Medicine

## 2023-10-09 ENCOUNTER — Ambulatory Visit (INDEPENDENT_AMBULATORY_CARE_PROVIDER_SITE_OTHER): Admitting: Family Medicine

## 2023-10-09 ENCOUNTER — Ambulatory Visit: Payer: Self-pay | Admitting: Family Medicine

## 2023-10-09 VITALS — BP 118/72 | HR 69 | Temp 97.6°F | Ht 66.0 in | Wt 140.4 lb

## 2023-10-09 DIAGNOSIS — M353 Polymyalgia rheumatica: Secondary | ICD-10-CM

## 2023-10-09 DIAGNOSIS — E039 Hypothyroidism, unspecified: Secondary | ICD-10-CM

## 2023-10-09 DIAGNOSIS — Z0000 Encounter for general adult medical examination without abnormal findings: Secondary | ICD-10-CM | POA: Diagnosis not present

## 2023-10-09 DIAGNOSIS — M810 Age-related osteoporosis without current pathological fracture: Secondary | ICD-10-CM

## 2023-10-09 DIAGNOSIS — D649 Anemia, unspecified: Secondary | ICD-10-CM | POA: Insufficient documentation

## 2023-10-09 DIAGNOSIS — Z1211 Encounter for screening for malignant neoplasm of colon: Secondary | ICD-10-CM | POA: Diagnosis not present

## 2023-10-09 DIAGNOSIS — I10 Essential (primary) hypertension: Secondary | ICD-10-CM | POA: Diagnosis not present

## 2023-10-09 DIAGNOSIS — Z1231 Encounter for screening mammogram for malignant neoplasm of breast: Secondary | ICD-10-CM

## 2023-10-09 DIAGNOSIS — N898 Other specified noninflammatory disorders of vagina: Secondary | ICD-10-CM | POA: Insufficient documentation

## 2023-10-09 LAB — CBC WITH DIFFERENTIAL/PLATELET
Basophils Absolute: 0 K/uL (ref 0.0–0.1)
Basophils Relative: 0.8 % (ref 0.0–3.0)
Eosinophils Absolute: 0.1 K/uL (ref 0.0–0.7)
Eosinophils Relative: 1.4 % (ref 0.0–5.0)
HCT: 37.1 % (ref 36.0–46.0)
Hemoglobin: 12.2 g/dL (ref 12.0–15.0)
Lymphocytes Relative: 24 % (ref 12.0–46.0)
Lymphs Abs: 1 K/uL (ref 0.7–4.0)
MCHC: 32.8 g/dL (ref 30.0–36.0)
MCV: 84.1 fl (ref 78.0–100.0)
Monocytes Absolute: 0.4 K/uL (ref 0.1–1.0)
Monocytes Relative: 8.2 % (ref 3.0–12.0)
Neutro Abs: 2.8 K/uL (ref 1.4–7.7)
Neutrophils Relative %: 65.6 % (ref 43.0–77.0)
Platelets: 215 K/uL (ref 150.0–400.0)
RBC: 4.41 Mil/uL (ref 3.87–5.11)
RDW: 14.9 % (ref 11.5–15.5)
WBC: 4.3 K/uL (ref 4.0–10.5)

## 2023-10-09 LAB — FERRITIN: Ferritin: 13.4 ng/mL (ref 10.0–291.0)

## 2023-10-09 LAB — IRON: Iron: 50 ug/dL (ref 42–145)

## 2023-10-09 MED ORDER — LEVOTHYROXINE SODIUM 50 MCG PO TABS: 50.0000 ug | ORAL_TABLET | Freq: Every day | ORAL | 3 refills | Status: AC

## 2023-10-09 NOTE — Assessment & Plan Note (Signed)
 Mammogram ordered Pt will call to schedule

## 2023-10-09 NOTE — Assessment & Plan Note (Signed)
 On chronic prednisone  from rheumatology Gets dexa with rheumatology  Discussed fall prevention, supplements and exercise for bone density

## 2023-10-09 NOTE — Progress Notes (Signed)
 Thy   Subjective:    Patient ID: Denise Grant, female    DOB: 1955-11-03, 68 y.o.   MRN: [id number]  HPI  Here for health maintenance exam and to review chronic medical problems   Wt Readings from Last 3 Encounters:  10/09/23 140 lb 6.4 oz (63.7 kg)  04/26/23 139 lb 6.4 oz (63.2 kg)  10/26/22 139 lb 12.8 oz (63.4 kg)   22.66 kg/m  Vitals:   10/09/23 0824  BP: 118/72  Pulse: 69  Temp: 97.6 F (36.4 C)  SpO2: 96%    Immunization History  Administered Date(s) Administered   INFLUENZA, HIGH DOSE SEASONAL PF 10/10/2019   Influenza Inj Mdck Quad Pf 10/14/2017   Influenza Split 11/24/2010   Influenza, Seasonal, Injecte, Preservative Fre 10/21/2015   Influenza,inj,Quad PF,6+ Mos 10/27/2016, 09/27/2018   Influenza-Unspecified 10/18/2013, 11/01/2014, 11/05/2020   Moderna Sars-Covid-2 Vaccination 04/06/2019, 05/04/2019   Td 04/17/2002   Tdap 05/11/2012    Health Maintenance Due  Topic Date Due   Hepatitis C Screening  Never done   Pneumococcal Vaccine: 50+ Years (1 of 2 - PCV) Never done   Zoster Vaccines- Shingrix (1 of 2) Never done   DTaP/Tdap/Td (3 - Td or Tdap) 05/12/2022   Medicare Annual Wellness (AWV)  10/05/2023   Mammogram  10/27/2023   Pna vaccine-declines Flu shot -declines Tetanus 04/2012-declines Shingrix -declines   Mammogram 10/2022 (had repeat views and bx) Self breast exam- no lumps   Gyn health No problems  Has a cyst that never changes - would like to see gyn  Bothersome but has had possibly since birth    Colon cancer screening  Cologuard neg 08/2022   Bone health  Dexa  (from rheum) - 2 y ago/ will get with them Osteoporosis In setting of low dose prednisone  for PMR / no big changes / is on 3 mg prednisone    Falls-none  Fractures-none  Supplements -vitamin D  Last vitamin D  Lab Results  Component Value Date   VD25OH 59.44 10/02/2023    Exercise  Walking 2 miles per day  Has gym membership   Derm care in July  Had some  lesions treated on her face  Is good about sun protection    Mood    10/09/2023    8:31 AM 10/05/2022   10:38 AM 10/03/2022   12:01 PM 09/08/2022    9:33 AM 02/14/2022   11:08 AM  Depression screen PHQ 2/9  Decreased Interest 0 0 0 0 0  Down, Depressed, Hopeless 0 0 0 0 0  PHQ - 2 Score 0 0 0 0 0  Altered sleeping 0  0 0 1  Tired, decreased energy 0  0 1 1  Change in appetite 0  0 0 1  Feeling bad or failure about yourself  0  0 0 0  Trouble concentrating 0  0 0 0  Moving slowly or fidgety/restless 0  0 0 0  Suicidal thoughts 0  0 0 0  PHQ-9 Score 0  0 1 3  Difficult doing work/chores Not difficult at all  Not difficult at all Somewhat difficult Not difficult at all   HTN with history of a fib  bp is stable today  No cp or palpitations or headaches or edema  No side effects to medicines  BP Readings from Last 3 Encounters:  10/09/23 118/72  04/26/23 (!) 144/80  10/26/22 132/68    Metoprolol  xl 12.5 mg daily from cardiology Cardizem  30 mg prn a fib /elevated  HR  Xarelto  for anti coag  Doing well with that   Pulse Readings from Last 3 Encounters:  10/09/23 69  04/26/23 76  10/26/22 77      Lab Results  Component Value Date   NA 140 10/02/2023   K 4.3 10/02/2023   CO2 29 10/02/2023   GLUCOSE 73 10/02/2023   BUN 12 10/02/2023   CREATININE 0.68 10/02/2023   CALCIUM 9.2 10/02/2023   GFR 88.78 10/02/2023   EGFR 73 10/28/2021   GFRNONAA >60 08/23/2021    Hypothyroidism  Pt has no clinical changes No change in energy level/ hair or skin/ edema and no tremor Lab Results  Component Value Date   TSH 1.04 10/02/2023    Levothyroxine  50 mcg daily   Cholesterol Lab Results  Component Value Date   CHOL 158 10/02/2023   CHOL 188 09/02/2022   CHOL 184 09/02/2021   Lab Results  Component Value Date   HDL 56.80 10/02/2023   HDL 58.50 09/02/2022   HDL 65.00 09/02/2021   Lab Results  Component Value Date   LDLCALC 86 10/02/2023   LDLCALC 111 (H)  09/02/2022   LDLCALC 104 (H) 09/02/2021   Lab Results  Component Value Date   TRIG 73.0 10/02/2023   TRIG 91.0 09/02/2022   TRIG 76.0 09/02/2021   Lab Results  Component Value Date   CHOLHDL 3 10/02/2023   CHOLHDL 3 09/02/2022   CHOLHDL 3 09/02/2021   No results found for: LDLDIRECT  Is eating pretty well  Does not eat as much as she used  No beef or fried foods   Other labs Lab Results  Component Value Date   WBC 4.3 10/09/2023   HGB 12.2 10/09/2023   HCT 37.1 10/09/2023   MCV 84.1 10/09/2023   PLT 215.0 10/09/2023   No blood donation No surgeries    Lab Results  Component Value Date   ALT 17 10/02/2023   AST 25 10/02/2023   ALKPHOS 65 10/02/2023   BILITOT 0.4 10/02/2023     Patient Active Problem List   Diagnosis Date Noted   Vaginal cyst 10/09/2023   Anemia 10/09/2023   Pedal edema 10/03/2022   History of UTI 02/14/2022   Encounter for screening mammogram for breast cancer 09/06/2021   PMR (polymyalgia rheumatica) 09/06/2021   Atrial fibrillation with RVR (HCC) 08/22/2021   Abnormal x-ray of knee 08/17/2021   Effusion of right knee 08/17/2021   Acute pain of right knee 08/16/2021   Posterior knee pain, right 08/16/2021   Rheumatoid factor positive 08/28/2020   Myalgia 08/25/2020   Secondary hypercoagulable state 07/05/2019   Lumbar disc disease    Bilateral bunions    Paroxysmal atrial fibrillation (HCC) 09/27/2018   Essential hypertension 01/01/2015   Hypothyroidism 06/25/2014   Migraine with vertigo 05/21/2014   History of Chiari malformation 05/21/2014   Atrophic vaginitis 05/01/2013   Colon cancer screening 04/20/2012   Routine general medical examination at a health care facility 11/18/2010   INSOMNIA 01/27/2010   HEADACHE 03/31/2008   Osteoporosis 09/18/2007   Generalized anxiety disorder 05/05/2006   MITRAL VALVE PROLAPSE 05/05/2006   ALLERGIC RHINITIS 05/05/2006   FIBROCYSTIC BREAST DISEASE 05/05/2006   SEBORRHEIC DERMATITIS  05/05/2006   KERATOSIS, ACTINIC 05/05/2006   NECK PAIN, CHRONIC 05/05/2006   Past Medical History:  Diagnosis Date   Actinic keratosis    Allergic rhinitis    Anxiety    Chiari malformation    DDD (degenerative disc disease)    Family  history of other specified malignant neoplasm    Headache(784.0)    Insomnia, unspecified    Lactose intolerance    Mitral valve disorders(424.0)    Osteopenia    Seborrheic dermatitis, unspecified    Vaginal cyst    stable   Past Surgical History:  Procedure Laterality Date   AIR/FLUID EXCHANGE Right 07/25/2018   Procedure: Air/Fluid Exchange;  Surgeon: Tobie Baptist, MD;  Location: [facility] OR;  Service: Ophthalmology;  Laterality: Right;   ATRIAL FIBRILLATION ABLATION N/A 11/09/2021   Procedure: ATRIAL FIBRILLATION ABLATION;  Surgeon: Inocencio Soyla Lunger, MD;  Location: [facility] INVASIVE [facility] LAB;  Service: Cardiovascular;  Laterality: N/A;   bone spur removal  10/2004   shoulder   BREAST BIOPSY Right 10/27/2022   stereo bx dumbell shape clip calcs, FA changes neg   CATARACT EXTRACTION, BILATERAL Bilateral 2016   CERVICAL DISC SURGERY  2005   Chiari Malformation surgery  06/2001   EXCISION MORTON'S NEUROMA  02/2002   GAS INSERTION Right 07/25/2018   Procedure: Insertion Of Gas SF6;  Surgeon: Tobie Baptist, MD;  Location[facility] OR;  Service: Ophthalmology;  Laterality: Right;   LUMBAR DISC SURGERY     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   Procedure: REPAIR OF COMPLEX TRACTION RETINAL DETACHMENT - 25GA VITRECTOMY, ENDOLASER, DRAINAGE OF SUBRETINAL FLUID;  Surgeon: Tobie Baptist, MD;  Location: [facility] OR;  Service: Ophthalmology;  Laterality: Right;   TUBAL LIGATION     Social History   Tobacco Use   Smoking status: Former    Current packs/day: 0.00    Types: Cigarettes    Quit date: 01/17/2001    Years since quitting: 22.7    Passive exposure: Past   Smokeless tobacco: Never   Tobacco comments:    Former smoker 01/13/2021   Vaping Use   Vaping status: Never Used  Substance Use Topics   Alcohol use: No    Alcohol/week: 0.0 standard drinks of alcohol   Drug use: No   Family History  Problem Relation Age of Onset   Melanoma Sister    Lung cancer Father    Coronary artery disease Mother    Heart failure Mother    Hypertension Mother    Anxiety disorder Son    Lung cancer Brother        + smoker   Breast cancer Neg Hx    Allergies  Allergen Reactions   Fentanyl  Other (See Comments)    Seizure    Buspar  [Buspirone ]     Felt funny   Calcium     REACTION: cannot tolerate any calcium supplement - GI sympotms   Fluticasone Propionate     REACTION: headache/irritation   Hctz [Hydrochlorothiazide ] Other (See Comments)    Eye redness and discomfort/ slt blurred vision    Metoprolol      Low pulse and dizziness    Paroxetine     REACTION: abdominal pain and constipation   Zoloft  [Sertraline ]     Diarrhea    Hydroxychloroquine Itching and Rash   Current Outpatient Medications on File Prior to Visit  Medication Sig Dispense Refill   acetaminophen  (TYLENOL ) 325 MG tablet Take 650 mg by mouth every 6 (six) hours as needed for moderate pain, mild pain or headache.     Cholecalciferol (VITAMIN D3) 125 MCG (5000 UT) CAPS Take 1 capsule by mouth daily.     diltiazem  (CARDIZEM ) 30 MG tablet Take 1 tablet every 4 hours AS NEEDED for AFIB HR >100 30 tablet 1  hydrocortisone  2.5 % cream Apply topically 2 (two) times daily as needed (Rash). 30 g 2   LORazepam  (ATIVAN ) 1 MG tablet TAKE 1/2 TABLET BY MOUTH AT BEDTIME AS NEEDED 15 tablet 3   metoprolol  succinate (TOPROL -XL) 25 MG 24 hr tablet Take 0.5 tablets (12.5 mg total) by mouth daily. 25 tablet 1   metroNIDAZOLE  (METROCREAM ) 0.75 % cream Apply topically 2 (two) times daily. 45 g 11   Multiple Vitamins-Minerals (HAIR SKIN & NAILS PO) Take 1 capsule by mouth daily.     predniSONE  (DELTASONE ) 1 MG tablet Take 1 mg by mouth. 2-3 times daily     rivaroxaban   (XARELTO ) 20 MG TABS tablet Take 1 tablet (20 mg total) by mouth daily with supper. 90 tablet 2   No current facility-administered medications on file prior to visit.    Review of Systems  Constitutional:  Negative for activity change, appetite change, fatigue, fever and unexpected weight change.  HENT:  Negative for congestion, ear pain, rhinorrhea, sinus pressure and sore throat.   Eyes:  Negative for pain, redness and visual disturbance.  Respiratory:  Negative for cough, shortness of breath and wheezing.   Cardiovascular:  Negative for chest pain and palpitations.  Gastrointestinal:  Negative for abdominal pain, blood in stool, constipation and diarrhea.  Endocrine: Negative for polydipsia and polyuria.  Genitourinary:  Negative for dysuria, frequency and urgency.  Musculoskeletal:  Negative for arthralgias, back pain and myalgias.  Skin:  Negative for pallor and rash.  Allergic/Immunologic: Negative for environmental allergies.  Neurological:  Negative for dizziness, syncope and headaches.  Hematological:  Negative for adenopathy. Does not bruise/bleed easily.  Psychiatric/Behavioral:  Negative for decreased concentration and dysphoric mood. The patient is not nervous/anxious.        Objective:   Physical Exam Constitutional:      General: She is not in acute distress.    Appearance: Normal appearance. She is well-developed and normal weight. She is not ill-appearing or diaphoretic.  HENT:     Head: Normocephalic and atraumatic.     Right Ear: Tympanic membrane, ear canal and external ear normal.     Left Ear: Tympanic membrane, ear canal and external ear normal.     Nose: Nose normal. No congestion.     Mouth/Throat:     Mouth: Mucous membranes are moist.     Pharynx: Oropharynx is clear. No posterior oropharyngeal erythema.  Eyes:     General: No scleral icterus.    Extraocular Movements: Extraocular movements intact.     Conjunctiva/sclera: Conjunctivae normal.      Pupils: Pupils are equal, round, and reactive to light.  Neck:     Thyroid : No thyromegaly.     Vascular: No carotid bruit or JVD.  Cardiovascular:     Rate and Rhythm: Normal rate and regular rhythm.     Pulses: Normal pulses.     Heart sounds: Normal heart sounds.     No gallop.  Pulmonary:     Effort: Pulmonary effort is normal. No respiratory distress.     Breath sounds: Normal breath sounds. No wheezing.     Comments: Good air exch Chest:     Chest wall: No tenderness.  Abdominal:     General: Bowel sounds are normal. There is no distension or abdominal bruit.     Palpations: Abdomen is soft. There is no mass.     Tenderness: There is no abdominal tenderness.     Hernia: No hernia is present.  Genitourinary:  Comments: Breast exam: No mass, nodules, thickening, tenderness, bulging, retraction, inflamation, nipple discharge or skin changes noted.  No axillary or clavicular LA.     Musculoskeletal:        General: No tenderness. Normal range of motion.     Cervical back: Normal range of motion and neck supple. No rigidity. No muscular tenderness.     Right lower leg: No edema.     Left lower leg: No edema.     Comments: No kyphosis   Lymphadenopathy:     Cervical: No cervical adenopathy.  Skin:    General: Skin is warm and dry.     Coloration: Skin is not pale.     Findings: No erythema or rash.     Comments: Solar lentigines diffusely  Scattered sks  Neurological:     Mental Status: She is alert. Mental status is at baseline.     Cranial Nerves: No cranial nerve deficit.     Motor: No abnormal muscle tone.     Coordination: Coordination normal.     Gait: Gait normal.     Deep Tendon Reflexes: Reflexes are normal and symmetric. Reflexes normal.  Psychiatric:        Mood and Affect: Mood normal.        Cognition and Memory: Cognition and memory normal.           Assessment & Plan:   Problem List Items Addressed This Visit       Cardiovascular and  Mediastinum   Essential hypertension    bp in fair control at this time  BP Readings from Last 1 Encounters:  10/09/23 118/72   No changes needed Most recent labs reviewed  Disc lifstyle change with low sodium diet and exercise  Metoprolol  xl 12.5 mg daily   Cardizem  prn a fib            Endocrine   Hypothyroidism   Hypothyroidism  Pt has no clinical changes No change in energy level/ hair or skin/ edema and no tremor Lab Results  Component Value Date   TSH 1.04 10/02/2023    Continues levothyroxine  50 mcg daily      Relevant Medications   levothyroxine  (SYNTHROID ) 50 MCG tablet     Musculoskeletal and Integument   Osteoporosis   On chronic prednisone  from rheumatology Gets dexa with rheumatology  Discussed fall prevention, supplements and exercise for bone density          Genitourinary   Vaginal cyst   Pt has had most of her life Is bothersome Requested ref to gyn to see if there are treatment options       Relevant Orders   Ambulatory referral to Gynecology     Other   Routine general medical examination at a health care facility - Primary   Reviewed health habits including diet and exercise and skin cancer prevention Reviewed appropriate screening tests for age  Also reviewed health mt list, fam hx and immunization status , as well as social and family history   See HPI Labs reviewed and ordered Health Maintenance  Topic Date Due   Hepatitis C Screening  Never done   Pneumococcal Vaccine for age over 36 (1 of 2 - PCV) Never done   Zoster (Shingles) Vaccine (1 of 2) Never done   DTaP/Tdap/Td vaccine (3 - Td or Tdap) 05/12/2022   Medicare Annual Wellness Visit  10/05/2023   Breast Cancer Screening  10/27/2023   COVID-19 Vaccine (3 - Moderna risk series)  10/19/2023*   Flu Shot  04/16/2024*   Cologuard (Stool DNA test)  09/11/2025   DEXA scan (bone density measurement)  Completed   HPV Vaccine  Aged Out   Meningitis B Vaccine  Aged Out   Stool  Blood Test  Discontinued  *Topic was postponed. The date shown is not the original due date.   Declines imms including pna, flu, tetanus and shingrix  Mammogram ordered Will have dexa with rheumatology Discussed fall prevention, supplements and exercise for bone density  Derm care utd July  PHQ 0        PMR (polymyalgia rheumatica)   Continues prednisone  3 mg daily  Rheumatology watches bone density  Blood glucose is normal      Encounter for screening mammogram for breast cancer   Mammogram ordered Pt will call to schedule       Relevant Orders   MM 3D SCREENING MAMMOGRAM BILATERAL BREAST   Colon cancer screening   Cologuard negative 08/2022       Anemia   New mild anemia with hb of 11.8 Has not had bleeding known or donated blood Is on xarelto   Repeat today with iron studies       Relevant Orders   CBC with Differential/Platelet (Completed)   Ferritin (Completed)   Iron (Completed)

## 2023-10-09 NOTE — Assessment & Plan Note (Signed)
 Pt has had most of her life Is bothersome Requested ref to gyn to see if there are treatment options

## 2023-10-09 NOTE — Assessment & Plan Note (Signed)
 Reviewed health habits including diet and exercise and skin cancer prevention Reviewed appropriate screening tests for age  Also reviewed health mt list, fam hx and immunization status , as well as social and family history   See HPI Labs reviewed and ordered Health Maintenance  Topic Date Due   Hepatitis C Screening  Never done   Pneumococcal Vaccine for age over 42 (1 of 2 - PCV) Never done   Zoster (Shingles) Vaccine (1 of 2) Never done   DTaP/Tdap/Td vaccine (3 - Td or Tdap) 05/12/2022   Medicare Annual Wellness Visit  10/05/2023   Breast Cancer Screening  10/27/2023   COVID-19 Vaccine (3 - Moderna risk series) 10/19/2023*   Flu Shot  04/16/2024*   Cologuard (Stool DNA test)  09/11/2025   DEXA scan (bone density measurement)  Completed   HPV Vaccine  Aged Out   Meningitis B Vaccine  Aged Out   Stool Blood Test  Discontinued  *Topic was postponed. The date shown is not the original due date.   Declines imms including pna, flu, tetanus and shingrix  Mammogram ordered Will have dexa with rheumatology Discussed fall prevention, supplements and exercise for bone density  Derm care utd July  PHQ 0

## 2023-10-09 NOTE — Assessment & Plan Note (Signed)
  bp in fair control at this time  BP Readings from Last 1 Encounters:  10/09/23 118/72   No changes needed Most recent labs reviewed  Disc lifstyle change with low sodium diet and exercise  Metoprolol  xl 12.5 mg daily   Cardizem  prn a fib

## 2023-10-09 NOTE — Assessment & Plan Note (Signed)
 Continues prednisone  3 mg daily  Rheumatology watches bone density  Blood glucose is normal

## 2023-10-09 NOTE — Assessment & Plan Note (Signed)
 Cologuard negative 08/2022

## 2023-10-09 NOTE — Assessment & Plan Note (Signed)
 New mild anemia with hb of 11.8 Has not had bleeding known or donated blood Is on xarelto   Repeat today with iron studies

## 2023-10-09 NOTE — Patient Instructions (Addendum)
 Get your bone density test with rheumatology  I put the referral in for gyn  Please let us  know if you don't hear in 1-2 weeks to set that up (mychart message or call or letter)   Keep walking Add some strength training to your routine, this is important for bone and brain health and can reduce your risk of falls and help your body use insulin properly and regulate weight  Light weights, exercise bands , and internet videos are a good way to start  Yoga (chair or regular), machines , floor exercises or a gym with machines are also good options   Labs today for anemia   No changes needed with thyroid  dose     You have an order for:  [x]   3D Mammogram  []   Bone Density     Please call for appointment:   [x]   [facility]  [address] [place] [address]  [phone]  []   [facility])   [address]  [place], [place] [address]  [phone]  []   [facility]      [address] [place], [place]        [phone]         []   [facility]  [address] [place], [place]  [phone]  []  [facility]   [address]   [place], [place] [address]  [phone]  []  [facility]  [address] [place], [place] [address] [phone]    Make sure to wear two piece clothing  No lotions powders or deodorants the day of the appointment Make sure to bring picture ID and insurance card.  Bring list of medications you are currently taking including any supplements.   Schedule your screening mammogram through MyChart!   [facility] [place] imaging sites can now be scheduled through MyChart.  Log into your MyChart account.  Go to 'Visit' (or 'Appointments' if  on mobile App) --> Schedule an  Appointment  Under 'Select  a Reason for Visit' choose the Mammogram  Screening option.  Complete the pre-visit questions  and select the time and place that  best fits your schedule

## 2023-10-09 NOTE — Assessment & Plan Note (Signed)
 Hypothyroidism  Pt has no clinical changes No change in energy level/ hair or skin/ edema and no tremor Lab Results  Component Value Date   TSH 1.04 10/02/2023    Continues levothyroxine  50 mcg daily

## 2023-10-16 ENCOUNTER — Ambulatory Visit: Payer: Medicare HMO

## 2023-10-16 VITALS — BP 113/73 | Ht 66.0 in | Wt 140.0 lb

## 2023-10-16 DIAGNOSIS — Z0000 Encounter for general adult medical examination without abnormal findings: Secondary | ICD-10-CM | POA: Diagnosis not present

## 2023-10-16 NOTE — Progress Notes (Signed)
 Because this visit was a virtual/telehealth visit,  certain criteria was not obtained, such a blood pressure, CBG if applicable, and timed get up and go. Any medications not marked as taking were not mentioned during the medication reconciliation part of the visit. Any vitals not documented were not able to be obtained due to this being a telehealth visit or patient was unable to self-report a recent blood pressure reading due to a lack of equipment at home via telehealth. Vitals that have been documented are verbally provided by the patient.  This visit was performed by a medical professional under my direct supervision. I was immediately available for consultation/collaboration. I have reviewed and agree with the Annual Wellness Visit documentation.  Subjective:   Denise Grant is a 68 y.o. who presents for a Medicare Wellness preventive visit.  As a reminder, Annual Wellness Visits don't include a physical exam, and some assessments may be limited, especially if this visit is performed virtually. We may recommend an in-person follow-up visit with your provider if needed.  Visit Complete: Virtual I connected with  Denise Grant on 10/16/23 by a video and audio enabled telemedicine application and verified that I am speaking with the correct person using two identifiers.  Patient Location: Home  Provider Location: Home Office  I discussed the limitations of evaluation and management by telemedicine. The patient expressed understanding and agreed to proceed.  Vital Signs: Because this visit was a virtual/telehealth visit, some criteria may be missing or patient reported. Any vitals not documented were not able to be obtained and vitals that have been documented are patient reported.  Persons Participating in Visit: Patient.  AWV Questionnaire: No: Patient Medicare AWV questionnaire was not completed prior to this visit.  Cardiac Risk Factors include: advanced age (>32men, >35  women);Other (see comment), Risk factor comments: afib     Objective:    Today's Vitals   10/16/23 1035  BP: 113/73  Weight: 140 lb (63.5 kg)  Height: 5' 6 (1.676 m)   Body mass index is 22.6 kg/m.     10/16/2023   10:35 AM 10/05/2022   10:36 AM 11/15/2021    2:15 PM 11/09/2021   11:55 AM 08/22/2021    8:00 AM 08/21/2021   11:59 PM 10/19/2020    8:40 AM  Advanced Directives  Does Patient Have a Medical Advance Directive? Yes Yes Yes No  No No  Type of Estate agent of [place];Living will Healthcare Power of [place];Living will Healthcare Power of Attorney      Does patient want to make changes to medical advance directive? No - Patient declined  No - Patient declined      Copy of Healthcare Power of Attorney in Chart? No - copy requested No - copy requested No - copy requested      Would patient like information on creating a medical advance directive?    No - Patient declined No - Patient declined      Current Medications (verified) Outpatient Encounter Medications as of 10/16/2023  Medication Sig   acetaminophen  (TYLENOL ) 325 MG tablet Take 650 mg by mouth every 6 (six) hours as needed for moderate pain, mild pain or headache.   Cholecalciferol (VITAMIN D3) 125 MCG (5000 UT) CAPS Take 1 capsule by mouth daily.   diltiazem  (CARDIZEM ) 30 MG tablet Take 1 tablet every 4 hours AS NEEDED for AFIB HR >100   hydrocortisone  2.5 % cream Apply topically 2 (two) times daily as needed (Rash).  levothyroxine  (SYNTHROID ) 50 MCG tablet Take 1 tablet (50 mcg total) by mouth daily before breakfast.   LORazepam  (ATIVAN ) 1 MG tablet TAKE 1/2 TABLET BY MOUTH AT BEDTIME AS NEEDED   metoprolol  succinate (TOPROL -XL) 25 MG 24 hr tablet Take 0.5 tablets (12.5 mg total) by mouth daily.   metroNIDAZOLE  (METROCREAM ) 0.75 % cream Apply topically 2 (two) times daily.   Multiple Vitamins-Minerals (HAIR SKIN & NAILS PO) Take 1 capsule by mouth daily.   predniSONE  (DELTASONE ) 1 MG tablet  Take 1 mg by mouth. 2-3 times daily   rivaroxaban  (XARELTO ) 20 MG TABS tablet Take 1 tablet (20 mg total) by mouth daily with supper.   No facility-administered encounter medications on file as of 10/16/2023.    Allergies (verified) Fentanyl , Buspar  [buspirone ], Calcium, Fluticasone propionate, Hctz [hydrochlorothiazide ], Metoprolol , Paroxetine, Zoloft  [sertraline ], and Hydroxychloroquine   History: 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.0)    Osteopenia    Seborrheic dermatitis, unspecified    Vaginal cyst    stable   Past Surgical History:  Procedure Laterality Date   AIR/FLUID EXCHANGE Right 07/25/2018   Procedure: Air/Fluid Exchange;  Surgeon: Tobie Baptist, MD;  Location: [facility] OR;  Service: Ophthalmology;  Laterality: Right;   ATRIAL FIBRILLATION ABLATION N/A 11/09/2021   Procedure: ATRIAL FIBRILLATION ABLATION;  Surgeon: Inocencio Soyla Lunger, MD;  Location: [facility] INVASIVE [facility] LAB;  Service: Cardiovascular;  Laterality: N/A;   bone spur removal  10/2004   shoulder   BREAST BIOPSY Right 10/27/2022   stereo bx dumbell shape clip calcs, FA changes neg   CATARACT EXTRACTION, BILATERAL Bilateral 2016   CERVICAL DISC SURGERY  2005   Chiari Malformation surgery  06/2001   EXCISION MORTON'S NEUROMA  02/2002   GAS INSERTION Right 07/25/2018   Procedure: Insertion Of Gas SF6;  Surgeon: Tobie Baptist, MD;  Location: [facility] OR;  Service: Ophthalmology;  Laterality: Right;   LUMBAR DISC SURGERY     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   Procedure: REPAIR OF COMPLEX TRACTION RETINAL DETACHMENT - 25GA VITRECTOMY, ENDOLASER, DRAINAGE OF SUBRETINAL FLUID;  Surgeon: Tobie Baptist, MD;  Location: [facility] OR;   Service: Ophthalmology;  Laterality: Right;   TUBAL LIGATION     Family History  Problem Relation Age of Onset   Melanoma Sister    Lung cancer Father    Coronary artery disease Mother    Heart failure Mother    Hypertension Mother    Anxiety disorder Son    Lung cancer Brother        + smoker   Breast cancer Neg Hx    Social History   Socioeconomic History   Marital status: Widowed    Spouse name: Not on file   Number of children: Not on file   Years of education: Not on file   Highest education level: GED or equivalent  Occupational History   Not on file  Tobacco Use   Smoking status: Former    Current packs/day: 0.00    Types: Cigarettes    Quit date: 01/17/2001    Years since quitting: 22.7    Passive exposure: Past   Smokeless tobacco: Never   Tobacco comments:  Former smoker 01/13/2021  Vaping Use   Vaping status: Never Used  Substance and Sexual Activity   Alcohol use: No    Alcohol/week: 0.0 standard drinks of alcohol   Drug use: No   Sexual activity: Not on file  Other Topics Concern   Not on file  Social History Narrative   Walks daily for exercise      Married--husband diagnosed with stomach cancer (5/11)   Lost her husband 4/12 to cancer    Social Drivers of Health   Financial Resource Strain: Low Risk  (10/16/2023)   Overall Financial Resource Strain (CARDIA)    Difficulty of Paying Living Expenses: Not hard at all  Food Insecurity: No Food Insecurity (10/16/2023)   Hunger Vital Sign    Worried About Running Out of Food in the Last Year: Never true    Ran Out of Food in the Last Year: Never true  Transportation Needs: No Transportation Needs (10/16/2023)   PRAPARE - Administrator, Civil Service (Medical): No    Lack of Transportation (Non-Medical): No  Physical Activity: Sufficiently Active (10/16/2023)   Exercise Vital Sign    Days of Exercise per Week: 6 days    Minutes of Exercise per Session: 60 min  Stress: No Stress Concern  Present (10/16/2023)   [employer] of Occupational Health - Occupational Stress Questionnaire    Feeling of Stress: Only a little  Social Connections: Moderately Isolated (10/16/2023)   Social Connection and Isolation Panel    Frequency of Communication with Friends and Family: More than three times a week    Frequency of Social Gatherings with Friends and Family: Twice a week    Attends Religious Services: 1 to 4 times per year    Active Member of [employer] or Organizations: No    Attends Banker Meetings: Not on file    Marital Status: Widowed    Tobacco Counseling Counseling given: Not Answered Tobacco comments: Former smoker 01/13/2021    Clinical Intake:  Pre-visit preparation completed: Yes  Pain : No/denies pain     BMI - recorded: 22.6 Nutritional Status: BMI of 19-24  Normal Nutritional Risks: None Diabetes: No  No results found for: HGBA1C   How often do you need to have someone help you when you read instructions, pamphlets, or other written materials from your doctor or pharmacy?: 1 - Never  Interpreter Needed?: No  Information entered by :: Ona Roehrs,CMA   Activities of Daily Living     10/16/2023    9:30 AM  In your present state of health, do you have any difficulty performing the following activities:  Hearing? 0  Vision? 0  Difficulty concentrating or making decisions? 0  Walking or climbing stairs? 0  Dressing or bathing? 0  Doing errands, shopping? 0  Preparing Food and eating ? N  Using the Toilet? N  In the past six months, have you accidently leaked urine? N  Do you have problems with loss of bowel control? N  Managing your Medications? N  Managing your Finances? N  Housekeeping or managing your Housekeeping? N    Patient Care Team: Tower, Laine LABOR, MD as PCP - General Inocencio, Soyla Lunger, MD as PCP - Electrophysiology (Cardiology) Patel, Donika K, DO as Consulting Physician (Neurology) Fate Morna SAILOR,  [facility] (Inactive) as Pharmacist (Pharmacist) [facility], [facility]  I have updated your Care Teams any recent Medical Services you may have received from other providers in the  past year.     Assessment:   This is a routine wellness examination for [place].  Hearing/Vision screen Hearing Screening - Comments:: No difficulties  Vision Screening - Comments:: Patient wears glasses    Goals Addressed             This Visit's Progress    Patient Stated       TO get rid of stress       Depression Screen     10/16/2023   10:40 AM 10/09/2023    8:31 AM 10/05/2022   10:38 AM 10/03/2022   12:01 PM 09/08/2022    9:33 AM 02/14/2022   11:08 AM 11/15/2021    2:10 PM  PHQ 2/9 Scores  PHQ - 2 Score 0 0 0 0 0 0 0  PHQ- 9 Score 0 0  0 1 3     Fall Risk     10/16/2023    9:30 AM 10/09/2023    8:31 AM 10/05/2022   10:37 AM 10/03/2022   12:01 PM 09/08/2022    9:33 AM  Fall Risk   Falls in the past year? 0 0 0 0 0  Number falls in past yr: 0 0 0 0 0  Injury with Fall? 0 0 0 0 0  Risk for fall due to : No Fall Risks No Fall Risks No Fall Risks;Medication side effect No Fall Risks No Fall Risks  Follow up Falls evaluation completed Falls evaluation completed Falls prevention discussed;Falls evaluation completed Falls evaluation completed Falls evaluation completed    MEDICARE RISK AT HOME:  Medicare Risk at Home Any stairs in or around the home?: No If so, are there any without handrails?: No Home free of loose throw rugs in walkways, pet beds, electrical cords, etc?: Yes Adequate lighting in your home to reduce risk of falls?: Yes Life alert?: No Use of a cane, walker or w/c?: (Patient-Rptd) No Grab bars in the bathroom?: (Patient-Rptd) No Shower chair or bench in shower?: (Patient-Rptd) No Elevated toilet seat or a handicapped toilet?: (Patient-Rptd) Yes  TIMED UP AND GO:  Was the test performed?  No  Cognitive Function: 6CIT completed         10/16/2023   10:41 AM 10/05/2022   10:38 AM 11/15/2021    2:17 PM  6CIT Screen  What Year? 0 points 0 points 0 points  What month? 0 points 0 points 0 points  What time? 0 points 0 points 0 points  Count back from 20 0 points 0 points 0 points  Months in reverse 0 points 0 points 0 points  Repeat phrase 0 points 0 points 0 points  Total Score 0 points 0 points 0 points    Immunizations Immunization History  Administered Date(s) Administered   INFLUENZA, HIGH DOSE SEASONAL PF 10/10/2019   Influenza Inj Mdck Quad Pf 10/14/2017   Influenza Split 11/24/2010   Influenza, Seasonal, Injecte, Preservative Fre 10/21/2015   Influenza,inj,Quad PF,6+ Mos 10/27/2016, 09/27/2018   Influenza-Unspecified 10/18/2013, 11/01/2014, 11/05/2020   Moderna Sars-Covid-2 Vaccination 04/06/2019, 05/04/2019   Td 04/17/2002   Tdap 05/11/2012    Screening Tests Health Maintenance  Topic Date Due   Hepatitis C Screening  Never done   Mammogram  10/27/2023   COVID-19 Vaccine (3 - Moderna risk series) 10/19/2023 (Originally 06/01/2019)   Zoster Vaccines- Shingrix (1 of 2) 01/08/2024 (Originally 03/11/1974)   Influenza Vaccine  04/16/2024 (Originally 08/18/2023)   DTaP/Tdap/Td (3 - Td or Tdap) 10/08/2024 (Originally 05/12/2022)   Pneumococcal Vaccine: 50+  Years (1 of 2 - PCV) 10/08/2024 (Originally 03/11/1973)   Medicare Annual Wellness (AWV)  10/15/2024   Fecal DNA (Cologuard)  09/11/2025   DEXA SCAN  Completed   HPV VACCINES  Aged Out   Meningococcal B Vaccine  Aged Out   COLON CANCER SCREENING ANNUAL FOBT  Discontinued    Health Maintenance Items Addressed:patient declined  Additional Screening:  Vision Screening: Recommended annual ophthalmology exams for early detection of glaucoma and other disorders of the eye. Is the patient up to date with their annual eye exam?  No  Who is the provider or what is the name of the office in which the patient attends annual eye exams?   Dental Screening:  Recommended annual dental exams for proper oral hygiene  Community Resource Referral / Chronic Care Management: CRR required this visit?  No   CCM required this visit?  No   Plan:    I have personally reviewed and noted the following in the patient's chart:   Medical and social history Use of alcohol, tobacco or illicit drugs  Current medications and supplements including opioid prescriptions. Patient is not currently taking opioid prescriptions. Functional ability and status Nutritional status Physical activity Advanced directives List of other physicians Hospitalizations, surgeries, and ER visits in previous 12 months Vitals Screenings to include cognitive, depression, and falls Referrals and appointments  In addition, I have reviewed and discussed with patient certain preventive protocols, quality metrics, and best practice recommendations. A written personalized care plan for preventive services as well as general preventive health recommendations were provided to patient.   Lyle MARLA Right, [place]   10/16/2023   After Visit Summary: (MyChart) Due to this being a telephonic visit, the after visit summary with patients personalized plan was offered to patient via MyChart   Notes: Nothing significant to report at this time.

## 2023-10-16 NOTE — Patient Instructions (Signed)
 Denise Grant,  Thank you for taking the time for your Medicare Wellness Visit. I appreciate your continued commitment to your health goals. Please review the care plan we discussed, and feel free to reach out if I can assist you further.  Medicare recommends these wellness visits once per year to help you and your care team stay ahead of potential health issues. These visits are designed to focus on prevention, allowing your provider to concentrate on managing your acute and chronic conditions during your regular appointments.  Please note that Annual Wellness Visits do not include a physical exam. Some assessments may be limited, especially if the visit was conducted virtually. If needed, we may recommend a separate in-person follow-up with your provider.  Ongoing Care Seeing your primary care provider every 3 to 6 months helps us  monitor your health and provide consistent, personalized care.   Referrals If a referral was made during today's visit and you haven't received any updates within two weeks, please contact the referred provider directly to check on the status.  Recommended Screenings:  Health Maintenance  Topic Date Due   Hepatitis C Screening  Never done   Breast Cancer Screening  10/27/2023   COVID-19 Vaccine (3 - Moderna risk series) 10/19/2023*   Zoster (Shingles) Vaccine (1 of 2) 01/08/2024*   Flu Shot  04/16/2024*   DTaP/Tdap/Td vaccine (3 - Td or Tdap) 10/08/2024*   Pneumococcal Vaccine for age over 20 (1 of 2 - PCV) 10/08/2024*   Medicare Annual Wellness Visit  10/15/2024   Cologuard (Stool DNA test)  09/11/2025   DEXA scan (bone density measurement)  Completed   HPV Vaccine  Aged Out   Meningitis B Vaccine  Aged Out   Stool Blood Test  Discontinued  *Topic was postponed. The date shown is not the original due date.       10/16/2023   10:35 AM  Advanced Directives  Does Patient Have a Medical Advance Directive? Yes  Type of Estate agent of  [place];Living will  Does patient want to make changes to medical advance directive? No - Patient declined  Copy of Healthcare Power of Attorney in Chart? No - copy requested   Advance Care Planning is important because it: Ensures you receive medical care that aligns with your values, goals, and preferences. Provides guidance to your family and loved ones, reducing the emotional burden of decision-making during critical moments.  Vision: Annual vision screenings are recommended for early detection of glaucoma, cataracts, and diabetic retinopathy. These exams can also reveal signs of chronic conditions such as diabetes and high blood pressure.  Dental: Annual dental screenings help detect early signs of oral cancer, gum disease, and other conditions linked to overall health, including heart disease and diabetes.  Please see the attached documents for additional preventive care recommendations.

## 2023-10-25 ENCOUNTER — Other Ambulatory Visit: Payer: Self-pay | Admitting: Family Medicine

## 2023-10-25 ENCOUNTER — Telehealth: Payer: Self-pay | Admitting: *Deleted

## 2023-10-25 NOTE — Telephone Encounter (Signed)
 Copied from CRM [id number]. Topic: Referral - Status >> Oct 25, 2023  9:25 AM Anairis L wrote: Reason for CRM: Patient is calling in because she has not heard from Gynecology  referral.   Referring To Provider Information [facility] [address] [place] [address] [phone]

## 2023-10-26 NOTE — Telephone Encounter (Signed)
 Pt advised referral dpt placed a letter in her mychart with the name and address and phone number of Gyn office she can call directly to schedule an appt. Pt was outside working so she couldn't write down any info but will check her mychart letters and if has any issues she will call back

## 2023-10-26 NOTE — Telephone Encounter (Signed)
 Name of Medication: Ativan  Name of Pharmacy: [facility] Last Fill or Written Date and Quantity: 06/23/23 #15 tabs/ 3 refills   Next Office Visit and Type: CPE 10/09/23

## 2023-10-31 ENCOUNTER — Ambulatory Visit
Admission: RE | Admit: 2023-10-31 | Discharge: 2023-10-31 | Disposition: A | Discharge status: Home / Self Care | Source: Ambulatory Visit | Attending: Family Medicine | Admitting: Family Medicine

## 2023-10-31 DIAGNOSIS — Z1231 Encounter for screening mammogram for malignant neoplasm of breast: Secondary | ICD-10-CM | POA: Insufficient documentation

## 2023-11-06 DIAGNOSIS — Z7960 Long term (current) use of unspecified immunomodulators and immunosuppressants: Secondary | ICD-10-CM | POA: Diagnosis not present

## 2023-11-06 DIAGNOSIS — M810 Age-related osteoporosis without current pathological fracture: Secondary | ICD-10-CM | POA: Diagnosis not present

## 2023-11-06 DIAGNOSIS — M0579 Rheumatoid arthritis with rheumatoid factor of multiple sites without organ or systems involvement: Secondary | ICD-10-CM | POA: Diagnosis not present

## 2023-11-06 DIAGNOSIS — M353 Polymyalgia rheumatica: Secondary | ICD-10-CM | POA: Diagnosis not present

## 2023-11-29 ENCOUNTER — Other Ambulatory Visit: Payer: Self-pay

## 2023-11-29 MED ORDER — RIVAROXABAN 20 MG PO TABS: 20.0000 mg | ORAL_TABLET | Freq: Every day | ORAL | 2 refills | Status: AC

## 2023-11-29 NOTE — Telephone Encounter (Signed)
 Prescription refill request for Xarelto  received.  Indication:afib Last office visit:4/25 Weight:63.5  kg Age:68 Scr:0.68  9/25 CrCl:78.27  ml/min  Prescription refilled

## 2023-12-05 DIAGNOSIS — H10413 Chronic giant papillary conjunctivitis, bilateral: Secondary | ICD-10-CM | POA: Diagnosis not present

## 2023-12-05 DIAGNOSIS — H04123 Dry eye syndrome of bilateral lacrimal glands: Secondary | ICD-10-CM | POA: Diagnosis not present

## 2023-12-05 DIAGNOSIS — Z961 Presence of intraocular lens: Secondary | ICD-10-CM | POA: Diagnosis not present

## 2023-12-05 DIAGNOSIS — H31093 Other chorioretinal scars, bilateral: Secondary | ICD-10-CM | POA: Diagnosis not present

## 2023-12-05 DIAGNOSIS — H43813 Vitreous degeneration, bilateral: Secondary | ICD-10-CM | POA: Diagnosis not present

## 2023-12-21 ENCOUNTER — Encounter: Payer: Self-pay | Admitting: Obstetrics and Gynecology

## 2023-12-21 ENCOUNTER — Ambulatory Visit: Admitting: Obstetrics and Gynecology

## 2023-12-21 VITALS — BP 147/75 | HR 60 | Wt 146.0 lb

## 2023-12-21 DIAGNOSIS — N898 Other specified noninflammatory disorders of vagina: Secondary | ICD-10-CM | POA: Diagnosis not present

## 2023-12-21 NOTE — Progress Notes (Unsigned)
 Obstetrics and Gynecology New Patient Evaluation  Appointment Date: 12/21/2023  OBGYN Clinic: [facility]   Primary Care Provider: Randeen Hardy A  Referring Provider: Randeen Hardy LABOR, MD  Chief Complaint:   History of Present Illness: Denise Grant is a 68 y.o.  (LMP: late 73s), seen for the above chief complaint.    Patient states she's had this vaginal cyst since the birth of her child who is close to 1 y/o. Cyst has been enlarging. Pt has been widowed for several, several years and hasn't been sexually active but may be in the upcoming future and wanted the cyst evaluated because it seems to be increased in size and comes out at times and can be somewhat uncomfortable  No vaginal discharge, bleeding or spotting and no urinary issues.   Review of Systems: Pertinent items are noted in HPI.   Patient Active Problem List   Diagnosis Date Noted   Vaginal cyst 10/09/2023   Anemia 10/09/2023   Pedal edema 10/03/2022   History of UTI 02/14/2022   Encounter for screening mammogram for breast cancer 09/06/2021   PMR (polymyalgia rheumatica) 09/06/2021   Atrial fibrillation with RVR (HCC) 08/22/2021   Abnormal x-ray of knee 08/17/2021   Effusion of right knee 08/17/2021   Acute pain of right knee 08/16/2021   Posterior knee pain, right 08/16/2021   Rheumatoid factor positive 08/28/2020   Myalgia 08/25/2020   Secondary hypercoagulable state 07/05/2019   Lumbar disc disease    Bilateral bunions    Paroxysmal atrial fibrillation (HCC) 09/27/2018   Essential hypertension 01/01/2015   Hypothyroidism 06/25/2014   Migraine with vertigo 05/21/2014   History of Chiari malformation 05/21/2014   Atrophic vaginitis 05/01/2013   Colon cancer screening 04/20/2012   Routine general medical examination at a health care facility 11/18/2010   INSOMNIA 01/27/2010   HEADACHE 03/31/2008   Osteoporosis 09/18/2007   Generalized anxiety disorder 05/05/2006   MITRAL  VALVE PROLAPSE 05/05/2006   ALLERGIC RHINITIS 05/05/2006   FIBROCYSTIC BREAST DISEASE 05/05/2006   SEBORRHEIC DERMATITIS 05/05/2006   KERATOSIS, ACTINIC 05/05/2006   NECK PAIN, CHRONIC 05/05/2006    Past Medical History:  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.0)    Osteopenia    Seborrheic dermatitis, unspecified    Vaginal cyst    stable   Past Surgical History:  Past Surgical History:  Procedure Laterality Date   AIR/FLUID EXCHANGE Right 07/25/2018   Procedure: Air/Fluid Exchange;  Surgeon: Tobie Baptist, MD;  Location: [facility] OR;  Service: Ophthalmology;  Laterality: Right;   ATRIAL FIBRILLATION ABLATION N/A 11/09/2021   Procedure: ATRIAL FIBRILLATION ABLATION;  Surgeon: Inocencio Soyla Lunger, MD;  Location: [facility] IN[facility]IVE [facility] LAB;  Service: Cardiovascular;  Laterality: N/A;   bone spur removal  10/2004   shoulder   BREAST BIOPSY Right 10/27/2022   stereo bx dumbell shape clip calcs, FA changes neg   CATARACT EXTRACTION, BILATERAL Bilateral 2016   CERVICAL DISC SURGERY  2005   Chiari Malformation surgery  06/2001   EXCISION MORTON'S NEUROMA  02/2002   GAS INSERTION Right 07/25/2018   Procedure: Insertion Of Gas SF6;  Surgeon: Tobie Baptist, MD;  Location: [facility] OR;  Service: Ophthalmology;  Laterality: Right;   LUMBAR DISC SURGERY     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 History of Pap Smear(s): Yes.   Last pap 07/2016, which was  negative pap and hpv History of HRT use: No.  Social History:  Social History   Socioeconomic History   Marital status: Widowed    Spouse name: Not on file   Number of children: Not on file   Years of education: Not on file   Highest education level: GED or equivalent  Occupational History   Not on file  Tobacco Use   Smoking status: Former    Current packs/day: 0.00    Types: Cigarettes    Quit date: 01/17/2001    Years since quitting: 22.9    Passive exposure: Past   Smokeless tobacco: Never   Tobacco comments:    Former smoker 01/13/2021  Vaping Use   Vaping status: Never Used  Substance and Sexual Activity   Alcohol use: No    Alcohol/week: 0.0 standard drinks of alcohol   Drug use: No   Sexual activity: Not on file  Other Topics Concern   Not on file  Social History Narrative   Walks daily for exercise      Married--husband diagnosed with stomach cancer (5/11)   Lost her husband 4/12 to cancer    Social Drivers of Health   Financial Resource Strain: Low Risk  (10/16/2023)   Overall Financial Resource Strain (CARDIA)    Difficulty of Paying Living Expenses: Not hard at all  Food Insecurity: No Food Insecurity (10/16/2023)   Hunger Vital Sign    Worried About Running Out of Food in the Last Year: Never true    Ran Out of Food in the Last Year: Never true  Transportation Needs: No Transportation Needs (10/16/2023)   PRAPARE - Administrator, Civil Service (Medical): No    Lack of Transportation (Non-Medical): No  Physical Activity: Sufficiently Active (10/16/2023)   Exercise Vital Sign    Days of Exercise per Week: 6 days    Minutes of Exercise per Session: 60 min  Stress: No Stress Concern Present (10/16/2023)   [employer] of Occupational Health - Occupational Stress Questionnaire    Feeling of Stress: Only a little  Social Connections: Moderately Isolated (10/16/2023)   Social Connection and Isolation Panel    Frequency of Communication with  Friends and Family: More than three times a week    Frequency of Social Gatherings with Friends and Family: Twice a week    Attends Religious Services: 1 to 4 times per year    Active Member of [employer] or Organizations: No    Attends Banker Meetings: Not on file    Marital Status: Widowed  Intimate Partner Violence: Not At Risk (10/16/2023)   Humiliation, Afraid, Rape, and Kick questionnaire    Fear of Current or Ex-Partner: No    Emotionally Abused: No    Physically Abused: No    Sexually Abused: No   Family History:  Family History  Problem Relation Age of Onset   Melanoma Sister    Lung cancer Father    Coronary artery disease Mother    Heart failure Mother  Hypertension Mother    Anxiety disorder Son    Lung cancer Brother        + smoker   Breast cancer Neg Hx     Medications Denise Grant had no medications administered during this visit. Current Outpatient Medications  Medication Sig Dispense Refill   acetaminophen  (TYLENOL ) 325 MG tablet Take 650 mg by mouth every 6 (six) hours as needed for moderate pain, mild pain or headache.     Cholecalciferol (VITAMIN D3) 125 MCG (5000 UT) CAPS Take 1 capsule by mouth daily.     diltiazem  (CARDIZEM ) 30 MG tablet Take 1 tablet every 4 hours AS NEEDED for AFIB HR >100 30 tablet 1   levothyroxine  (SYNTHROID ) 50 MCG tablet Take 1 tablet (50 mcg total) by mouth daily before breakfast. 90 tablet 3   LORazepam  (ATIVAN ) 1 MG tablet TAKE 1/2 TABLET BY MOUTH AT BEDTIME AS NEEDED 15 tablet 3   metoprolol  succinate (TOPROL -XL) 25 MG 24 hr tablet Take 0.5 tablets (12.5 mg total) by mouth daily. 25 tablet 1   predniSONE  (DELTASONE ) 1 MG tablet Take 1 mg by mouth. 2-3 times daily     rivaroxaban  (XARELTO ) 20 MG TABS tablet Take 1 tablet (20 mg total) by mouth daily with supper. 90 tablet 2   hydrocortisone  2.5 % cream Apply topically 2 (two) times daily as needed (Rash). 30 g 2   metroNIDAZOLE  (METROCREAM ) 0.75 % cream Apply  topically 2 (two) times daily. (Patient not taking: Reported on 12/21/2023) 45 g 11   Multiple Vitamins-Minerals (HAIR SKIN & NAILS PO) Take 1 capsule by mouth daily. (Patient not taking: Reported on 12/21/2023)     No current facility-administered medications for this visit.   Allergies Fentanyl , Buspar  [buspirone ], Calcium, Fluticasone propionate, Hctz [hydrochlorothiazide ], Metoprolol , Paroxetine, Zoloft  [sertraline ], and Hydroxychloroquine  Physical Exam:  BP (!) 147/75   Pulse 60   Wt 146 lb (66.2 kg)   BMI 23.57 kg/m  Body mass index is 23.57 kg/m. General appearance: Well nourished, well developed female in no acute distress.  RespiratoryNormal respiratory effort Abdomen: no masses, hernias; diffusely non tender to palpation, non distended Neuro/Psych:  Normal mood and affect.  Skin:  Warm and dry.  Lymphatic:  No inguinal lymphadenopathy.   With patient's permission and CMA, photos taken     Laboratory: none  Radiology: none  Assessment: patient stable  Plan:  1. Vaginal cyst (Primary) ?skenes vs gartner duct cyst. MRI recommended but patient prefers to see urogyn first.  - Ambulatory referral to Urogynecology  Orders Placed This Encounter  Procedures   Ambulatory referral to Urogynecology    Return if symptoms worsen or fail to improve.  Future Appointments  Date Time Provider Department Center  04/30/2024  9:15 AM Jackquline Sawyer, MD [facility] None    Bebe Izell Raddle MD Attending [facility])

## 2024-04-30 ENCOUNTER — Encounter: Admitting: Dermatology
